# Patient Record
Sex: Female | Born: 1950
Health system: Southern US, Community
[De-identification: ages and names within clinical notes are randomized; demographics above are authoritative.]

## PROBLEM LIST (undated history)

## (undated) DIAGNOSIS — M1990 Unspecified osteoarthritis, unspecified site: Secondary | ICD-10-CM

## (undated) DIAGNOSIS — Z8719 Personal history of other diseases of the digestive system: Secondary | ICD-10-CM

## (undated) DIAGNOSIS — F32A Depression, unspecified: Secondary | ICD-10-CM

## (undated) DIAGNOSIS — E079 Disorder of thyroid, unspecified: Secondary | ICD-10-CM

## (undated) DIAGNOSIS — F419 Anxiety disorder, unspecified: Secondary | ICD-10-CM

## (undated) DIAGNOSIS — F988 Other specified behavioral and emotional disorders with onset usually occurring in childhood and adolescence: Secondary | ICD-10-CM

## (undated) DIAGNOSIS — K8020 Calculus of gallbladder without cholecystitis without obstruction: Secondary | ICD-10-CM

## (undated) DIAGNOSIS — K219 Gastro-esophageal reflux disease without esophagitis: Secondary | ICD-10-CM

## (undated) DIAGNOSIS — G5600 Carpal tunnel syndrome, unspecified upper limb: Secondary | ICD-10-CM

## (undated) DIAGNOSIS — K635 Polyp of colon: Secondary | ICD-10-CM

## (undated) DIAGNOSIS — Z9889 Other specified postprocedural states: Secondary | ICD-10-CM

## (undated) DIAGNOSIS — F329 Major depressive disorder, single episode, unspecified: Secondary | ICD-10-CM

## (undated) DIAGNOSIS — I10 Essential (primary) hypertension: Secondary | ICD-10-CM

## (undated) DIAGNOSIS — E785 Hyperlipidemia, unspecified: Secondary | ICD-10-CM

## (undated) DIAGNOSIS — K5900 Constipation, unspecified: Secondary | ICD-10-CM

## (undated) HISTORY — DX: Hyperlipidemia, unspecified: E78.5

## (undated) HISTORY — PX: CHOLECYSTECTOMY: SHX55

## (undated) HISTORY — PX: NASAL SINUS SURGERY: SHX719

## (undated) HISTORY — PX: SHOULDER SURGERY: SHX246

## (undated) HISTORY — DX: Gastro-esophageal reflux disease without esophagitis: K21.9

## (undated) HISTORY — DX: Calculus of gallbladder without cholecystitis without obstruction: K80.20

## (undated) HISTORY — DX: Depression, unspecified: F32.A

## (undated) HISTORY — DX: Major depressive disorder, single episode, unspecified: F32.9

## (undated) HISTORY — DX: Unspecified osteoarthritis, unspecified site: M19.90

## (undated) HISTORY — DX: Anxiety disorder, unspecified: F41.9

## (undated) HISTORY — DX: Constipation, unspecified: K59.00

## (undated) HISTORY — PX: TUBAL LIGATION: SHX77

## (undated) HISTORY — PX: TIBIA FRACTURE SURGERY: SHX806

## (undated) HISTORY — DX: Other specified behavioral and emotional disorders with onset usually occurring in childhood and adolescence: F98.8

## (undated) HISTORY — DX: Carpal tunnel syndrome, unspecified upper limb: G56.00

## (undated) HISTORY — DX: Essential (primary) hypertension: I10

## (undated) HISTORY — DX: Disorder of thyroid, unspecified: E07.9

## (undated) HISTORY — DX: Personal history of other diseases of the digestive system: Z87.19

## (undated) HISTORY — DX: Polyp of colon: K63.5

## (undated) HISTORY — DX: Other specified postprocedural states: Z98.890

## (undated) HISTORY — PX: CARPAL TUNNEL RELEASE: SHX101

## (undated) HISTORY — PX: CLOSED REDUCTION HAND FRACTURE: SHX973

## (undated) HISTORY — PX: ABDOMINAL HYSTERECTOMY: SHX81

## (undated) HISTORY — PX: OTHER SURGICAL HISTORY: SHX169

---

## 1999-06-04 ENCOUNTER — Other Ambulatory Visit: Admission: RE | Admit: 1999-06-04 | Discharge: 1999-06-04 | Payer: Self-pay | Admitting: Obstetrics and Gynecology

## 1999-06-04 ENCOUNTER — Encounter (INDEPENDENT_AMBULATORY_CARE_PROVIDER_SITE_OTHER): Payer: Self-pay

## 2000-08-15 ENCOUNTER — Emergency Department (HOSPITAL_COMMUNITY): Admission: EM | Admit: 2000-08-15 | Discharge: 2000-08-15 | Payer: Self-pay | Admitting: Emergency Medicine

## 2000-08-15 ENCOUNTER — Encounter: Payer: Self-pay | Admitting: Emergency Medicine

## 2001-09-11 ENCOUNTER — Encounter: Payer: Self-pay | Admitting: Orthopedic Surgery

## 2001-09-11 ENCOUNTER — Inpatient Hospital Stay (HOSPITAL_COMMUNITY): Admission: EM | Admit: 2001-09-11 | Discharge: 2001-09-15 | Payer: Self-pay | Admitting: *Deleted

## 2002-07-29 ENCOUNTER — Other Ambulatory Visit: Admission: RE | Admit: 2002-07-29 | Discharge: 2002-07-29 | Payer: Self-pay | Admitting: Obstetrics and Gynecology

## 2002-12-02 ENCOUNTER — Ambulatory Visit (HOSPITAL_COMMUNITY): Admission: RE | Admit: 2002-12-02 | Discharge: 2002-12-02 | Payer: Self-pay | Admitting: Orthopedic Surgery

## 2004-07-02 ENCOUNTER — Ambulatory Visit: Payer: Self-pay | Admitting: Internal Medicine

## 2004-10-04 ENCOUNTER — Ambulatory Visit: Payer: Self-pay | Admitting: Internal Medicine

## 2004-12-06 ENCOUNTER — Ambulatory Visit: Payer: Self-pay | Admitting: Internal Medicine

## 2005-03-11 ENCOUNTER — Ambulatory Visit: Payer: Self-pay | Admitting: Internal Medicine

## 2005-06-05 ENCOUNTER — Ambulatory Visit: Payer: Self-pay | Admitting: Internal Medicine

## 2005-06-24 ENCOUNTER — Other Ambulatory Visit: Admission: RE | Admit: 2005-06-24 | Discharge: 2005-06-24 | Payer: Self-pay | Admitting: Obstetrics and Gynecology

## 2005-08-05 ENCOUNTER — Ambulatory Visit: Payer: Self-pay | Admitting: Internal Medicine

## 2005-11-10 ENCOUNTER — Ambulatory Visit: Payer: Self-pay | Admitting: Internal Medicine

## 2006-02-10 ENCOUNTER — Ambulatory Visit: Payer: Self-pay | Admitting: Internal Medicine

## 2006-05-14 ENCOUNTER — Ambulatory Visit: Payer: Self-pay | Admitting: Internal Medicine

## 2006-07-30 ENCOUNTER — Ambulatory Visit: Payer: Self-pay | Admitting: Internal Medicine

## 2006-10-23 ENCOUNTER — Ambulatory Visit: Payer: Self-pay | Admitting: Internal Medicine

## 2006-12-07 ENCOUNTER — Ambulatory Visit: Payer: Self-pay | Admitting: Internal Medicine

## 2007-02-16 ENCOUNTER — Ambulatory Visit: Payer: Self-pay | Admitting: Internal Medicine

## 2007-02-16 LAB — CONVERTED CEMR LAB: TSH: 0.38 microintl units/mL (ref 0.35–5.50)

## 2007-06-08 ENCOUNTER — Ambulatory Visit: Payer: Self-pay | Admitting: Internal Medicine

## 2007-06-08 DIAGNOSIS — F909 Attention-deficit hyperactivity disorder, unspecified type: Secondary | ICD-10-CM

## 2007-06-08 DIAGNOSIS — K219 Gastro-esophageal reflux disease without esophagitis: Secondary | ICD-10-CM | POA: Insufficient documentation

## 2007-06-08 DIAGNOSIS — G5600 Carpal tunnel syndrome, unspecified upper limb: Secondary | ICD-10-CM | POA: Insufficient documentation

## 2007-06-08 DIAGNOSIS — I10 Essential (primary) hypertension: Secondary | ICD-10-CM

## 2007-06-08 DIAGNOSIS — E039 Hypothyroidism, unspecified: Secondary | ICD-10-CM

## 2007-06-08 HISTORY — DX: Attention-deficit hyperactivity disorder, unspecified type: F90.9

## 2007-06-08 HISTORY — DX: Hypothyroidism, unspecified: E03.9

## 2007-06-08 HISTORY — DX: Essential (primary) hypertension: I10

## 2007-12-20 ENCOUNTER — Ambulatory Visit: Payer: Self-pay | Admitting: Internal Medicine

## 2007-12-20 DIAGNOSIS — M1990 Unspecified osteoarthritis, unspecified site: Secondary | ICD-10-CM | POA: Insufficient documentation

## 2007-12-20 DIAGNOSIS — K5900 Constipation, unspecified: Secondary | ICD-10-CM | POA: Insufficient documentation

## 2007-12-20 HISTORY — DX: Unspecified osteoarthritis, unspecified site: M19.90

## 2008-02-16 ENCOUNTER — Ambulatory Visit: Payer: Self-pay | Admitting: Internal Medicine

## 2008-03-20 ENCOUNTER — Ambulatory Visit: Payer: Self-pay | Admitting: Internal Medicine

## 2008-03-20 LAB — CONVERTED CEMR LAB: TSH: 0.38 microintl units/mL (ref 0.35–5.50)

## 2008-05-23 ENCOUNTER — Telehealth: Payer: Self-pay | Admitting: Internal Medicine

## 2008-05-26 ENCOUNTER — Ambulatory Visit: Payer: Self-pay | Admitting: Internal Medicine

## 2008-05-26 DIAGNOSIS — R10817 Generalized abdominal tenderness: Secondary | ICD-10-CM

## 2008-05-27 ENCOUNTER — Encounter: Payer: Self-pay | Admitting: Internal Medicine

## 2008-05-29 LAB — CONVERTED CEMR LAB
ALT: 13 units/L (ref 0–35)
AST: 20 units/L (ref 0–37)
Bilirubin, Direct: 0.1 mg/dL (ref 0.0–0.3)
Indirect Bilirubin: 0.3 mg/dL (ref 0.0–0.9)
Lymphocytes Relative: 32 % (ref 12–46)
Lymphs Abs: 2.5 10*3/uL (ref 0.7–4.0)
Monocytes Relative: 7 % (ref 3–12)
Neutro Abs: 4.6 10*3/uL (ref 1.7–7.7)
Neutrophils Relative %: 58 % (ref 43–77)
RBC: 4.35 M/uL (ref 3.87–5.11)
Total Bilirubin: 0.4 mg/dL (ref 0.3–1.2)
WBC: 8 10*3/uL (ref 4.0–10.5)

## 2008-06-20 ENCOUNTER — Ambulatory Visit: Payer: Self-pay | Admitting: Internal Medicine

## 2008-08-22 ENCOUNTER — Ambulatory Visit: Payer: Self-pay | Admitting: Internal Medicine

## 2008-10-17 ENCOUNTER — Ambulatory Visit: Payer: Self-pay | Admitting: Internal Medicine

## 2008-10-17 DIAGNOSIS — K645 Perianal venous thrombosis: Secondary | ICD-10-CM

## 2008-10-17 DIAGNOSIS — B070 Plantar wart: Secondary | ICD-10-CM

## 2008-10-17 HISTORY — DX: Perianal venous thrombosis: K64.5

## 2008-12-19 ENCOUNTER — Ambulatory Visit: Payer: Self-pay | Admitting: Internal Medicine

## 2009-01-29 ENCOUNTER — Encounter: Payer: Self-pay | Admitting: Internal Medicine

## 2009-03-27 ENCOUNTER — Ambulatory Visit: Payer: Self-pay | Admitting: Internal Medicine

## 2009-03-27 DIAGNOSIS — M8580 Other specified disorders of bone density and structure, unspecified site: Secondary | ICD-10-CM

## 2009-03-27 LAB — CONVERTED CEMR LAB
Basophils Absolute: 0 10*3/uL (ref 0.0–0.1)
Basophils Relative: 0.4 % (ref 0.0–3.0)
CO2: 32 meq/L (ref 19–32)
Calcium: 9.4 mg/dL (ref 8.4–10.5)
Chloride: 102 meq/L (ref 96–112)
Creatinine, Ser: 0.4 mg/dL (ref 0.4–1.2)
Glucose, Bld: 147 mg/dL — ABNORMAL HIGH (ref 70–99)
HCT: 35.8 % — ABNORMAL LOW (ref 36.0–46.0)
Hemoglobin: 12.4 g/dL (ref 12.0–15.0)
Lymphocytes Relative: 28.6 % (ref 12.0–46.0)
Lymphs Abs: 2.1 10*3/uL (ref 0.7–4.0)
MCHC: 34.6 g/dL (ref 30.0–36.0)
Monocytes Relative: 5.4 % (ref 3.0–12.0)
Neutro Abs: 4.8 10*3/uL (ref 1.4–7.7)
RBC: 4.36 M/uL (ref 3.87–5.11)
RDW: 13.7 % (ref 11.5–14.6)
T3, Free: 2.3 pg/mL (ref 2.3–4.2)

## 2009-04-03 ENCOUNTER — Telehealth: Payer: Self-pay | Admitting: *Deleted

## 2009-05-10 ENCOUNTER — Telehealth: Payer: Self-pay | Admitting: Internal Medicine

## 2009-05-28 ENCOUNTER — Ambulatory Visit: Payer: Self-pay | Admitting: Internal Medicine

## 2009-05-28 DIAGNOSIS — M5410 Radiculopathy, site unspecified: Secondary | ICD-10-CM | POA: Insufficient documentation

## 2009-05-28 DIAGNOSIS — IMO0002 Reserved for concepts with insufficient information to code with codable children: Secondary | ICD-10-CM

## 2009-05-28 DIAGNOSIS — G4733 Obstructive sleep apnea (adult) (pediatric): Secondary | ICD-10-CM | POA: Insufficient documentation

## 2009-05-28 HISTORY — DX: Radiculopathy, site unspecified: M54.10

## 2009-06-01 ENCOUNTER — Encounter: Payer: Self-pay | Admitting: Internal Medicine

## 2009-06-01 ENCOUNTER — Ambulatory Visit (HOSPITAL_BASED_OUTPATIENT_CLINIC_OR_DEPARTMENT_OTHER): Admission: RE | Admit: 2009-06-01 | Discharge: 2009-06-01 | Payer: Self-pay | Admitting: Internal Medicine

## 2009-06-12 ENCOUNTER — Ambulatory Visit: Payer: Self-pay | Admitting: Pulmonary Disease

## 2009-07-30 ENCOUNTER — Ambulatory Visit: Payer: Self-pay | Admitting: Internal Medicine

## 2009-07-30 DIAGNOSIS — F411 Generalized anxiety disorder: Secondary | ICD-10-CM

## 2009-07-30 HISTORY — DX: Generalized anxiety disorder: F41.1

## 2009-10-01 ENCOUNTER — Ambulatory Visit: Payer: Self-pay | Admitting: Internal Medicine

## 2009-10-01 LAB — CONVERTED CEMR LAB: T3, Free: 2.3 pg/mL (ref 2.3–4.2)

## 2009-11-16 ENCOUNTER — Ambulatory Visit: Payer: Self-pay | Admitting: Internal Medicine

## 2009-11-16 DIAGNOSIS — C44519 Basal cell carcinoma of skin of other part of trunk: Secondary | ICD-10-CM | POA: Insufficient documentation

## 2009-11-16 DIAGNOSIS — C44599 Other specified malignant neoplasm of skin of other part of trunk: Secondary | ICD-10-CM

## 2009-11-16 HISTORY — DX: Basal cell carcinoma of skin of other part of trunk: C44.519

## 2009-12-18 ENCOUNTER — Ambulatory Visit: Payer: Self-pay | Admitting: Internal Medicine

## 2010-01-22 ENCOUNTER — Ambulatory Visit: Payer: Self-pay | Admitting: Internal Medicine

## 2010-05-01 ENCOUNTER — Ambulatory Visit: Payer: Self-pay | Admitting: Internal Medicine

## 2010-05-01 LAB — CONVERTED CEMR LAB
CO2: 30 meq/L (ref 19–32)
Chloride: 104 meq/L (ref 96–112)
Creatinine, Ser: 0.6 mg/dL (ref 0.4–1.2)
Potassium: 3.5 meq/L (ref 3.5–5.1)
Sodium: 142 meq/L (ref 135–145)
TSH: 0.37 microintl units/mL (ref 0.35–5.50)

## 2010-07-31 ENCOUNTER — Ambulatory Visit: Payer: Self-pay | Admitting: Internal Medicine

## 2010-09-04 ENCOUNTER — Ambulatory Visit
Admission: RE | Admit: 2010-09-04 | Discharge: 2010-09-04 | Payer: Self-pay | Source: Home / Self Care | Attending: Internal Medicine | Admitting: Internal Medicine

## 2010-09-19 NOTE — Assessment & Plan Note (Signed)
Summary: fup//ccm   Vital Signs:  Patient profile:   60 year old female Height:      65 inches Weight:      222 pounds BMI:     37.08 Temp:     98.8 degrees F oral Pulse rate:   72 / minute Resp:     14 per minute BP sitting:   130 / 80  (left arm)  Vitals Entered By: Willy Eddy, LPN (September 04, 2010 1:33 PM) CC: roa, Hypertension Management Is Patient Diabetic? No   Primary Care Provider:  Stacie Glaze MD  CC:  roa and Hypertension Management.  History of Present Illness: has lost weight has been limiting calories this has held the GERD and the HTN stable back pain is stable  Hypertension History:      She denies headache, chest pain, palpitations, dyspnea with exertion, orthopnea, PND, peripheral edema, visual symptoms, neurologic problems, syncope, and side effects from treatment.        Positive major cardiovascular risk factors include female age 59 years old or older and hypertension.  Negative major cardiovascular risk factors include non-tobacco-user status.     Preventive Screening-Counseling & Management  Alcohol-Tobacco     Smoking Status: never     Passive Smoke Exposure: no     Tobacco Counseling: not indicated; no tobacco use  Current Problems (verified): 1)  Carcinoma, Basal Cell, Back  (ICD-173.5) 2)  Obstructive Sleep Apnea  (ICD-327.23) 3)  Back Pain With Radiculopathy  (ICD-729.2) 4)  Osteopenia  (ICD-733.90) 5)  Plantar Wart  (ICD-078.12) 6)  Unspecified Thrombosed Hemorrhoids  (ICD-455.7) 7)  Abdominal Tenderness, Generalized  (ICD-789.67) 8)  Bursitis, Right Shoulder  (ICD-726.10) 9)  Anxiety Disorder, Generalized  (ICD-300.02) 10)  Osteoarthritis  (ICD-715.90) 11)  Constipation, Intermittent  (ICD-564.00) 12)  Carpal Tunnel Syndrome  (ICD-354.0) 13)  Adhd  (ICD-314.01) 14)  Hypothyroidism  (ICD-244.9) 15)  Hypertension  (ICD-401.9) 16)  Gerd  (ICD-530.81)  Current Medications (verified): 1)  Synthroid 137 Mcg  Tabs  (Levothyroxine Sodium) .... Once Daily 2)  Prevacid 30 Mg  Cpdr (Lansoprazole) .... Once Daily 3)  Ziac 5-6.25 Mg  Tabs (Bisoprolol-Hydrochlorothiazide) .... Once Daily 4)  Colace 100 Mg  Caps (Docusate Sodium) .... One By Mouth Daily 5)  Celebrex 200 Mg Caps (Celecoxib) .... Once Daily As Needed 6)  Vitamin D 1000 Unit Tabs (Cholecalciferol) .... One By Mouth Daily 7)  Diazepam 10 Mg Tabs (Diazepam) .... 1/2 By Mouth Two Times A Day  Allergies (verified): No Known Drug Allergies  Past History:  Family History: Last updated: 06/08/2007 father Family History Lung cancer mother Family History High cholesterol  Social History: Last updated: 06/08/2007 Married Never Smoked Alcohol use-no Drug use-no Regular exercise-no  Risk Factors: Exercise: no (06/08/2007)  Risk Factors: Smoking Status: never (09/04/2010) Passive Smoke Exposure: no (09/04/2010)  Past medical, surgical, family and social histories (including risk factors) reviewed, and no changes noted (except as noted below).  Past Medical History: Reviewed history from 12/20/2007 and no changes required. Hypertension Hypothyroidism GERD carple tunnel Osteoarthritis  Past Surgical History: Reviewed history from 06/08/2007 and no changes required. Cholecystectomy Hysterectomy Tubal ligation fx leg Sinus surgery  Family History: Reviewed history from 06/08/2007 and no changes required. father Family History Lung cancer mother Family History High cholesterol  Social History: Reviewed history from 06/08/2007 and no changes required. Married Never Smoked Alcohol use-no Drug use-no Regular exercise-no  Review of Systems  The patient denies anorexia, fever, weight loss, weight  gain, vision loss, decreased hearing, hoarseness, chest pain, syncope, dyspnea on exertion, peripheral edema, prolonged cough, headaches, hemoptysis, abdominal pain, melena, hematochezia, severe indigestion/heartburn, hematuria,  incontinence, genital sores, muscle weakness, suspicious skin lesions, transient blindness, difficulty walking, depression, unusual weight change, abnormal bleeding, enlarged lymph nodes, angioedema, and breast masses.    Physical Exam  General:  alert and overweight-appearing.   Head:  normocephalic and no abnormalities palpated.   Eyes:  vision grossly intact and pupils reactive to light.   Ears:  R ear normal and L ear normal.   Nose:  no external deformity and no nasal discharge.   Neck:  supple and full ROM.   Lungs:  normal respiratory effort and no wheezes.   Heart:  normal rate and regular rhythm.   Abdomen:  soft and non-tender.   Msk:  lumbar lordosis and SI joint tenderness.   Pulses:  R and L carotid,radial,femoral,dorsalis pedis and posterior tibial pulses are full and equal bilaterally Extremities:  No clubbing, cyanosis, edema, or deformity noted with normal full range of motion of all joints.   Neurologic:  No cranial nerve deficits noted. Station and gait are normal. Plantar reflexes are down-going bilaterally. DTRs are symmetrical throughout. Sensory, motor and coordinative functions appear intact. Skin:  turgor normal and color normal.   Psych:  flat affect, subdued, and withdrawn.     Impression & Recommendations:  Problem # 1:  HYPOTHYROIDISM (ICD-244.9)  Her updated medication list for this problem includes:    Synthroid 137 Mcg Tabs (Levothyroxine sodium) ..... Once daily  Labs Reviewed: TSH: 0.37 (05/01/2010)     Problem # 2:  HYPERTENSION (ICD-401.9) Assessment: Unchanged  Her updated medication list for this problem includes:    Ziac 5-6.25 Mg Tabs (Bisoprolol-hydrochlorothiazide) ..... Once daily  BP today: 130/80 Prior BP: 136/80 (05/01/2010)  Prior 10 Yr Risk Heart Disease: Not enough information (12/20/2007)  Labs Reviewed: K+: 3.5 (05/01/2010) Creat: : 0.6 (05/01/2010)     Problem # 3:  GERD (ICD-530.81) Assessment: Improved  Her updated  medication list for this problem includes:    Prevacid 30 Mg Cpdr (Lansoprazole) ..... Once daily  Labs Reviewed: Hgb: 12.4 (03/27/2009)   Hct: 35.8 (03/27/2009)  Problem # 4:  OSTEOARTHRITIS (ICD-715.90) Assessment: Deteriorated sligt worsening with less exercize Her updated medication list for this problem includes:    Celebrex 200 Mg Caps (Celecoxib) ..... Once daily as needed  Discussed use of medications, application of heat or cold, and exercises.   Complete Medication List: 1)  Synthroid 137 Mcg Tabs (Levothyroxine sodium) .... Once daily 2)  Prevacid 30 Mg Cpdr (Lansoprazole) .... Once daily 3)  Ziac 5-6.25 Mg Tabs (Bisoprolol-hydrochlorothiazide) .... Once daily 4)  Colace 100 Mg Caps (Docusate sodium) .... One by mouth daily 5)  Celebrex 200 Mg Caps (Celecoxib) .... Once daily as needed 6)  Vitamin D 1000 Unit Tabs (Cholecalciferol) .... One by mouth daily 7)  Diazepam 10 Mg Tabs (Diazepam) .... 1/2 by mouth two times a day  Hypertension Assessment/Plan:      The patient's hypertensive risk group is category B: At least one risk factor (excluding diabetes) with no target organ damage.  Today's blood pressure is 130/80.  Her blood pressure goal is < 140/90.  Patient Instructions: 1)  Please schedule a follow-up appointment in 3 months.   Orders Added: 1)  Est. Patient Level II [11914]

## 2010-09-19 NOTE — Assessment & Plan Note (Signed)
Summary: 3 month rov/njr rsc bmp/njr PT BUMPED//SLM   Vital Signs:  Patient profile:   60 year old female Height:      65 inches Weight:      238 pounds BMI:     39.75 Temp:     98.2 degrees F oral Pulse rate:   76 / minute Resp:     14 per minute BP sitting:   136 / 80  (left arm)  Vitals Entered By: Willy Eddy, LPN (May 01, 2010 8:01 AM) CC: roa, Hypertension Management Is Patient Diabetic? No   CC:  roa and Hypertension Management.  History of Present Illness: the pt had BCCA and he site was reexamined  Hypertension History:      She denies headache, chest pain, palpitations, dyspnea with exertion, orthopnea, PND, peripheral edema, visual symptoms, neurologic problems, syncope, and side effects from treatment.        Positive major cardiovascular risk factors include female age 15 years old or older and hypertension.  Negative major cardiovascular risk factors include non-tobacco-user status.     Preventive Screening-Counseling & Management  Alcohol-Tobacco     Smoking Status: never     Passive Smoke Exposure: no     Tobacco Counseling: not indicated; no tobacco use  Problems Prior to Update: 1)  Carcinoma, Basal Cell, Back  (ICD-173.5) 2)  Obstructive Sleep Apnea  (ICD-327.23) 3)  Back Pain With Radiculopathy  (ICD-729.2) 4)  Osteopenia  (ICD-733.90) 5)  Plantar Wart  (ICD-078.12) 6)  Unspecified Thrombosed Hemorrhoids  (ICD-455.7) 7)  Abdominal Tenderness, Generalized  (ICD-789.67) 8)  Bursitis, Right Shoulder  (ICD-726.10) 9)  Anxiety Disorder, Generalized  (ICD-300.02) 10)  Osteoarthritis  (ICD-715.90) 11)  Constipation, Intermittent  (ICD-564.00) 12)  Carpal Tunnel Syndrome  (ICD-354.0) 13)  Adhd  (ICD-314.01) 14)  Hypothyroidism  (ICD-244.9) 15)  Hypertension  (ICD-401.9) 16)  Gerd  (ICD-530.81)  Current Problems (verified): 1)  Carcinoma, Basal Cell, Back  (ICD-173.5) 2)  Obstructive Sleep Apnea  (ICD-327.23) 3)  Back Pain With  Radiculopathy  (ICD-729.2) 4)  Osteopenia  (ICD-733.90) 5)  Plantar Wart  (ICD-078.12) 6)  Unspecified Thrombosed Hemorrhoids  (ICD-455.7) 7)  Abdominal Tenderness, Generalized  (ICD-789.67) 8)  Bursitis, Right Shoulder  (ICD-726.10) 9)  Anxiety Disorder, Generalized  (ICD-300.02) 10)  Osteoarthritis  (ICD-715.90) 11)  Constipation, Intermittent  (ICD-564.00) 12)  Carpal Tunnel Syndrome  (ICD-354.0) 13)  Adhd  (ICD-314.01) 14)  Hypothyroidism  (ICD-244.9) 15)  Hypertension  (ICD-401.9) 16)  Gerd  (ICD-530.81)  Medications Prior to Update: 1)  Synthroid 137 Mcg  Tabs (Levothyroxine Sodium) .... Once Daily 2)  Prevacid 30 Mg  Cpdr (Lansoprazole) .... Once Daily 3)  Ziac 5-6.25 Mg  Tabs (Bisoprolol-Hydrochlorothiazide) .... Once Daily 4)  Colace 100 Mg  Caps (Docusate Sodium) .... One By Mouth Daily 5)  Celebrex 200 Mg Caps (Celecoxib) .... Once Daily As Needed 6)  Vitamin D 1000 Unit Tabs (Cholecalciferol) .... One By Mouth Daily 7)  Clorazepate Dipotassium 3.75 Mg Tabs (Clorazepate Dipotassium) .... One By Mouth Two Times A Day  Current Medications (verified): 1)  Synthroid 137 Mcg  Tabs (Levothyroxine Sodium) .... Once Daily 2)  Prevacid 30 Mg  Cpdr (Lansoprazole) .... Once Daily 3)  Ziac 5-6.25 Mg  Tabs (Bisoprolol-Hydrochlorothiazide) .... Once Daily 4)  Colace 100 Mg  Caps (Docusate Sodium) .... One By Mouth Daily 5)  Celebrex 200 Mg Caps (Celecoxib) .... Once Daily As Needed 6)  Vitamin D 1000 Unit Tabs (Cholecalciferol) .... One By Mouth Daily  7)  Clorazepate Dipotassium 3.75 Mg Tabs (Clorazepate Dipotassium) .... One By Mouth Two Times A Day  Allergies (verified): No Known Drug Allergies  Contraindications/Deferment of Procedures/Staging:    Test/Procedure: FLU VAX    Reason for deferment: patient declined   Past History:  Family History: Last updated: 06/08/2007 father Family History Lung cancer mother Family History High cholesterol  Social History: Last  updated: 06/08/2007 Married Never Smoked Alcohol use-no Drug use-no Regular exercise-no  Risk Factors: Exercise: no (06/08/2007)  Risk Factors: Smoking Status: never (05/01/2010) Passive Smoke Exposure: no (05/01/2010)  Past medical, surgical, family and social histories (including risk factors) reviewed, and no changes noted (except as noted below).  Past Medical History: Reviewed history from 12/20/2007 and no changes required. Hypertension Hypothyroidism GERD carple tunnel Osteoarthritis  Past Surgical History: Reviewed history from 06/08/2007 and no changes required. Cholecystectomy Hysterectomy Tubal ligation fx leg Sinus surgery  Family History: Reviewed history from 06/08/2007 and no changes required. father Family History Lung cancer mother Family History High cholesterol  Social History: Reviewed history from 06/08/2007 and no changes required. Married Never Smoked Alcohol use-no Drug use-no Regular exercise-no Passive Smoke Exposure:  no  Review of Systems  The patient denies anorexia, fever, weight loss, weight gain, vision loss, decreased hearing, hoarseness, chest pain, syncope, dyspnea on exertion, peripheral edema, prolonged cough, headaches, hemoptysis, abdominal pain, melena, hematochezia, severe indigestion/heartburn, hematuria, incontinence, genital sores, muscle weakness, suspicious skin lesions, transient blindness, difficulty walking, depression, unusual weight change, abnormal bleeding, enlarged lymph nodes, angioedema, and breast masses.    Physical Exam  General:  alert and overweight-appearing.   Head:  normocephalic and no abnormalities palpated.   Eyes:  vision grossly intact and pupils reactive to light.   Ears:  R ear normal and L ear normal.   Nose:  no external deformity and no nasal discharge.   Mouth:  pharynx pink and moist and no erythema.   Neck:  supple and full ROM.   Lungs:  normal respiratory effort and no wheezes.    Heart:  normal rate and regular rhythm.   Abdomen:  soft and non-tender.   Msk:  lumbar lordosis and SI joint tenderness.   Pulses:  R and L carotid,radial,femoral,dorsalis pedis and posterior tibial pulses are full and equal bilaterally Extremities:  No clubbing, cyanosis, edema, or deformity noted with normal full range of motion of all joints.   Neurologic:  No cranial nerve deficits noted. Station and gait are normal. Plantar reflexes are down-going bilaterally. DTRs are symmetrical throughout. Sensory, motor and coordinative functions appear intact.   Impression & Recommendations:  Problem # 1:  CARCINOMA, BASAL CELL, BACK (ICD-173.5) site inspected fro recurrance  Problem # 2:  HYPOTHYROIDISM (ICD-244.9)  Her updated medication list for this problem includes:    Synthroid 137 Mcg Tabs (Levothyroxine sodium) ..... Once daily  Labs Reviewed: TSH: 0.30 (10/01/2009)     Orders: Venipuncture (81191) TLB-TSH (Thyroid Stimulating Hormone) (84443-TSH)  Problem # 3:  GERD (ICD-530.81)  Her updated medication list for this problem includes:    Prevacid 30 Mg Cpdr (Lansoprazole) ..... Once daily  Labs Reviewed: Hgb: 12.4 (03/27/2009)   Hct: 35.8 (03/27/2009)  Problem # 4:  ANXIETY DISORDER, GENERALIZED (ICD-300.02) discuss referral to job counsilling Her updated medication list for this problem includes:    Diazepam 10 Mg Tabs (Diazepam) .Marland Kitchen... 1/2 by mouth two times a day  Discussed medication use and relaxation techniques.   Complete Medication List: 1)  Synthroid 137 Mcg Tabs (Levothyroxine sodium) .Marland KitchenMarland KitchenMarland Kitchen  Once daily 2)  Prevacid 30 Mg Cpdr (Lansoprazole) .... Once daily 3)  Ziac 5-6.25 Mg Tabs (Bisoprolol-hydrochlorothiazide) .... Once daily 4)  Colace 100 Mg Caps (Docusate sodium) .... One by mouth daily 5)  Celebrex 200 Mg Caps (Celecoxib) .... Once daily as needed 6)  Vitamin D 1000 Unit Tabs (Cholecalciferol) .... One by mouth daily 7)  Diazepam 10 Mg Tabs (Diazepam)  .... 1/2 by mouth two times a day  Other Orders: TLB-BMP (Basic Metabolic Panel-BMET) (80048-METABOL)  Hypertension Assessment/Plan:      The patient's hypertensive risk group is category B: At least one risk factor (excluding diabetes) with no target organ damage.  Today's blood pressure is 136/80.  Her blood pressure goal is < 140/90.  Patient Instructions: 1)  Please schedule a follow-up appointment in 3 months. Prescriptions: DIAZEPAM 10 MG TABS (DIAZEPAM) 1/2 by mouth two times a day  #30 x 3   Entered and Authorized by:   Stacie Glaze MD   Signed by:   Stacie Glaze MD on 05/01/2010   Method used:   Print then Give to Patient   RxID:   [id number]   Appended Document: Orders Update    Clinical Lists Changes  Orders: Added new Service order of Specimen Handling ([id number]) - Signed

## 2010-09-19 NOTE — Assessment & Plan Note (Signed)
Summary: 2 month rov/njr/pt rescd//ccm   Vital Signs:  Patient profile:   60 year old female Height:      65 inches Weight:      233 pounds BMI:     38.91 Temp:     98.2 degrees F oral Pulse rate:   76 / minute Resp:     14 per minute BP sitting:   130 / 80  (left arm) Cuff size:   large  Vitals Entered By: Willy Eddy, LPN (November 16, 1608 10:48 AM) CC: roa-   CC:  roa-.  History of Present Illness: weight is down mood is no subjectively improved some head conjestion  Preventive Screening-Counseling & Management  Alcohol-Tobacco     Smoking Status: never  Problems Prior to Update: 1)  Obstructive Sleep Apnea  (ICD-327.23) 2)  Back Pain With Radiculopathy  (ICD-729.2) 3)  Osteopenia  (ICD-733.90) 4)  Plantar Wart  (ICD-078.12) 5)  Unspecified Thrombosed Hemorrhoids  (ICD-455.7) 6)  Abdominal Tenderness, Generalized  (ICD-789.67) 7)  Bursitis, Right Shoulder  (ICD-726.10) 8)  Anxiety Disorder, Generalized  (ICD-300.02) 9)  Osteoarthritis  (ICD-715.90) 10)  Constipation, Intermittent  (ICD-564.00) 11)  Carpal Tunnel Syndrome  (ICD-354.0) 12)  Adhd  (ICD-314.01) 13)  Hypothyroidism  (ICD-244.9) 14)  Hypertension  (ICD-401.9) 15)  Gerd  (ICD-530.81)  Current Problems (verified): 1)  Obstructive Sleep Apnea  (ICD-327.23) 2)  Back Pain With Radiculopathy  (ICD-729.2) 3)  Osteopenia  (ICD-733.90) 4)  Plantar Wart  (ICD-078.12) 5)  Unspecified Thrombosed Hemorrhoids  (ICD-455.7) 6)  Abdominal Tenderness, Generalized  (ICD-789.67) 7)  Bursitis, Right Shoulder  (ICD-726.10) 8)  Anxiety Disorder, Generalized  (ICD-300.02) 9)  Osteoarthritis  (ICD-715.90) 10)  Constipation, Intermittent  (ICD-564.00) 11)  Carpal Tunnel Syndrome  (ICD-354.0) 12)  Adhd  (ICD-314.01) 13)  Hypothyroidism  (ICD-244.9) 14)  Hypertension  (ICD-401.9) 15)  Gerd  (ICD-530.81)  Medications Prior to Update: 1)  Synthroid 137 Mcg  Tabs (Levothyroxine Sodium) .... Once Daily 2)   Prevacid 30 Mg  Cpdr (Lansoprazole) .... Once Daily 3)  Ziac 5-6.25 Mg  Tabs (Bisoprolol-Hydrochlorothiazide) .... Once Daily 4)  Colace 100 Mg  Caps (Docusate Sodium) .... One By Mouth Daily 5)  Fluoxetine Hcl 20 Mg Caps (Fluoxetine Hcl) .... One By Mouth Daily 6)  Celebrex 200 Mg Caps (Celecoxib) .... Once Daily As Needed 7)  Vitamin D 1000 Unit Tabs (Cholecalciferol) .... One By Mouth Daily  Current Medications (verified): 1)  Synthroid 137 Mcg  Tabs (Levothyroxine Sodium) .... Once Daily 2)  Prevacid 30 Mg  Cpdr (Lansoprazole) .... Once Daily 3)  Ziac 5-6.25 Mg  Tabs (Bisoprolol-Hydrochlorothiazide) .... Once Daily 4)  Colace 100 Mg  Caps (Docusate Sodium) .... One By Mouth Daily 5)  Fluoxetine Hcl 20 Mg Caps (Fluoxetine Hcl) .... One By Mouth Daily 6)  Celebrex 200 Mg Caps (Celecoxib) .... Once Daily As Needed 7)  Vitamin D 1000 Unit Tabs (Cholecalciferol) .... One By Mouth Daily  Allergies (verified): No Known Drug Allergies  Past History:  Family History: Last updated: 06/08/2007 father Family History Lung cancer mother Family History High cholesterol  Social History: Last updated: 06/08/2007 Married Never Smoked Alcohol use-no Drug use-no Regular exercise-no  Risk Factors: Exercise: no (06/08/2007)  Risk Factors: Smoking Status: never (11/16/2009)  Past medical, surgical, family and social histories (including risk factors) reviewed, and no changes noted (except as noted below).  Past Medical History: Reviewed history from 12/20/2007 and no changes required. Hypertension Hypothyroidism GERD carple tunnel Osteoarthritis  Past Surgical History: Reviewed history from 06/08/2007 and no changes required. Cholecystectomy Hysterectomy Tubal ligation fx leg Sinus surgery  Family History: Reviewed history from 06/08/2007 and no changes required. father Family History Lung cancer mother Family History High cholesterol  Social History: Reviewed history  from 06/08/2007 and no changes required. Married Never Smoked Alcohol use-no Drug use-no Regular exercise-no  Physical Exam  General:  alert and overweight-appearing.   Head:  normocephalic and no abnormalities palpated.   Eyes:  vision grossly intact and pupils reactive to light.   Ears:  R ear normal and L ear normal.   Nose:  no external deformity and no nasal discharge.   Mouth:  pharynx pink and moist and no erythema.     Impression & Recommendations:  Problem # 1:  ANXIETY DISORDER, GENERALIZED (ICD-300.02)  Her updated medication list for this problem includes:    Pristiq 50 Mg Xr24h-tab (Desvenlafaxine succinate) ..... One by mouth daily  Discussed medication use and relaxation techniques.   Problem # 2:  BACK PAIN WITH RADICULOPATHY (ICD-729.2)  Problem # 3:  CARCINOMA, BASAL CELL, BACK (ICD-173.5) schedule surgical removal on lesion on back with characteristic of BSCS  Complete Medication List: 1)  Synthroid 137 Mcg Tabs (Levothyroxine sodium) .... Once daily 2)  Prevacid 30 Mg Cpdr (Lansoprazole) .... Once daily 3)  Ziac 5-6.25 Mg Tabs (Bisoprolol-hydrochlorothiazide) .... Once daily 4)  Colace 100 Mg Caps (Docusate sodium) .... One by mouth daily 5)  Pristiq 50 Mg Xr24h-tab (Desvenlafaxine succinate) .... One by mouth daily 6)  Celebrex 200 Mg Caps (Celecoxib) .... Once daily as needed 7)  Vitamin D 1000 Unit Tabs (Cholecalciferol) .... One by mouth daily  Patient Instructions: 1)  2-3 week for skin surgery

## 2010-09-19 NOTE — Assessment & Plan Note (Signed)
Summary: bcc removal on back /bmw   Vital Signs:  Patient profile:   60 year old female Height:      65 inches Weight:      233 pounds BMI:     38.91 Temp:     98.3 degrees F oral Pulse rate:   76 / minute Resp:     14 per minute BP sitting:   134 / 80  (left arm)  Vitals Entered By: Willy Eddy, LPN (Dec 18, 1608 4:51 PM) CC: bx on back   CC:  bx on back.  History of Present Illness: presnet for removal of two lesions on back that are cosistnat with BCCA presentations  Preventive Screening-Counseling & Management  Alcohol-Tobacco     Smoking Status: never  Problems Prior to Update: 1)  Carcinoma, Basal Cell, Back  (ICD-173.5) 2)  Obstructive Sleep Apnea  (ICD-327.23) 3)  Back Pain With Radiculopathy  (ICD-729.2) 4)  Osteopenia  (ICD-733.90) 5)  Plantar Wart  (ICD-078.12) 6)  Unspecified Thrombosed Hemorrhoids  (ICD-455.7) 7)  Abdominal Tenderness, Generalized  (ICD-789.67) 8)  Bursitis, Right Shoulder  (ICD-726.10) 9)  Anxiety Disorder, Generalized  (ICD-300.02) 10)  Osteoarthritis  (ICD-715.90) 11)  Constipation, Intermittent  (ICD-564.00) 12)  Carpal Tunnel Syndrome  (ICD-354.0) 13)  Adhd  (ICD-314.01) 14)  Hypothyroidism  (ICD-244.9) 15)  Hypertension  (ICD-401.9) 16)  Gerd  (ICD-530.81)  Current Problems (verified): 1)  Carcinoma, Basal Cell, Back  (ICD-173.5) 2)  Obstructive Sleep Apnea  (ICD-327.23) 3)  Back Pain With Radiculopathy  (ICD-729.2) 4)  Osteopenia  (ICD-733.90) 5)  Plantar Wart  (ICD-078.12) 6)  Unspecified Thrombosed Hemorrhoids  (ICD-455.7) 7)  Abdominal Tenderness, Generalized  (ICD-789.67) 8)  Bursitis, Right Shoulder  (ICD-726.10) 9)  Anxiety Disorder, Generalized  (ICD-300.02) 10)  Osteoarthritis  (ICD-715.90) 11)  Constipation, Intermittent  (ICD-564.00) 12)  Carpal Tunnel Syndrome  (ICD-354.0) 13)  Adhd  (ICD-314.01) 14)  Hypothyroidism  (ICD-244.9) 15)  Hypertension  (ICD-401.9) 16)  Gerd  (ICD-530.81)  Medications  Prior to Update: 1)  Synthroid 137 Mcg  Tabs (Levothyroxine Sodium) .... Once Daily 2)  Prevacid 30 Mg  Cpdr (Lansoprazole) .... Once Daily 3)  Ziac 5-6.25 Mg  Tabs (Bisoprolol-Hydrochlorothiazide) .... Once Daily 4)  Colace 100 Mg  Caps (Docusate Sodium) .... One By Mouth Daily 5)  Pristiq 50 Mg Xr24h-Tab (Desvenlafaxine Succinate) .... One By Mouth Daily 6)  Celebrex 200 Mg Caps (Celecoxib) .... Once Daily As Needed 7)  Vitamin D 1000 Unit Tabs (Cholecalciferol) .... One By Mouth Daily  Current Medications (verified): 1)  Synthroid 137 Mcg  Tabs (Levothyroxine Sodium) .... Once Daily 2)  Prevacid 30 Mg  Cpdr (Lansoprazole) .... Once Daily 3)  Ziac 5-6.25 Mg  Tabs (Bisoprolol-Hydrochlorothiazide) .... Once Daily 4)  Colace 100 Mg  Caps (Docusate Sodium) .... One By Mouth Daily 5)  Celebrex 200 Mg Caps (Celecoxib) .... Once Daily As Needed 6)  Vitamin D 1000 Unit Tabs (Cholecalciferol) .... One By Mouth Daily 7)  Clorazepate Dipotassium 3.75 Mg Tabs (Clorazepate Dipotassium) .... One By Mouth Two Times A Day  Allergies (verified): No Known Drug Allergies  Past History:  Family History: Last updated: 06/08/2007 father Family History Lung cancer mother Family History High cholesterol  Social History: Last updated: 06/08/2007 Married Never Smoked Alcohol use-no Drug use-no Regular exercise-no  Risk Factors: Exercise: no (06/08/2007)  Risk Factors: Smoking Status: never (12/18/2009)  Past medical, surgical, family and social histories (including risk factors) reviewed, and no changes noted (except as  noted below).  Past Medical History: Reviewed history from 12/20/2007 and no changes required. Hypertension Hypothyroidism GERD carple tunnel Osteoarthritis  Past Surgical History: Reviewed history from 06/08/2007 and no changes required. Cholecystectomy Hysterectomy Tubal ligation fx leg Sinus surgery  Family History: Reviewed history from 06/08/2007 and no  changes required. father Family History Lung cancer mother Family History High cholesterol  Social History: Reviewed history from 06/08/2007 and no changes required. Married Never Smoked Alcohol use-no Drug use-no Regular exercise-no  Review of Systems  The patient denies anorexia, fever, weight loss, weight gain, vision loss, decreased hearing, hoarseness, chest pain, syncope, dyspnea on exertion, peripheral edema, prolonged cough, headaches, hemoptysis, abdominal pain, melena, hematochezia, severe indigestion/heartburn, hematuria, incontinence, genital sores, muscle weakness, suspicious skin lesions, transient blindness, difficulty walking, depression, unusual weight change, abnormal bleeding, enlarged lymph nodes, angioedema, and breast masses.    Physical Exam  General:  alert and overweight-appearing.   Skin:  two lesions on  back   Impression & Recommendations:  Problem # 1:  CARCINOMA, BASAL CELL, BACK (ICD-173.5)  pt was preped in a sterile manor and informed consent obtained. Using a 15 blade two lesions was removed and sent for pathology. sterile dressings were applied  and wound care discussed with the pt. one lesion was mid upper back and one lesion was mid lower back  Orders: No Charge Patient Arrived (NCPA0) (NCPA0) Excise Malig lesion (FEENL) 0.6 - 1.0 cm (11641)  Complete Medication List: 1)  Synthroid 137 Mcg Tabs (Levothyroxine sodium) .... Once daily 2)  Prevacid 30 Mg Cpdr (Lansoprazole) .... Once daily 3)  Ziac 5-6.25 Mg Tabs (Bisoprolol-hydrochlorothiazide) .... Once daily 4)  Colace 100 Mg Caps (Docusate sodium) .... One by mouth daily 5)  Celebrex 200 Mg Caps (Celecoxib) .... Once daily as needed 6)  Vitamin D 1000 Unit Tabs (Cholecalciferol) .... One by mouth daily 7)  Clorazepate Dipotassium 3.75 Mg Tabs (Clorazepate dipotassium) .... One by mouth two times a day  Patient Instructions: 1)  Please schedule a follow-up appointment in 1  month. Prescriptions: CLORAZEPATE DIPOTASSIUM 3.75 MG TABS (CLORAZEPATE DIPOTASSIUM) one by mouth two times a day  #60 x 3   Entered and Authorized by:   Stacie Glaze MD   Signed by:   Stacie Glaze MD on 12/18/2009   Method used:   Print then Give to Patient   RxID:   [id number]

## 2010-09-19 NOTE — Assessment & Plan Note (Signed)
Summary: 2 month rov/njr   Vital Signs:  Patient profile:   60 year old female Height:      65 inches Weight:      238 pounds BMI:     39.75 Temp:     98.2 degrees F oral Pulse rate:   72 / minute Resp:     14 per minute BP sitting:   136 / 84  (left arm)  Vitals Entered By: Willy Eddy, LPN (October 01, 2009 1:33 PM) CC: roa, Hypertension Management   CC:  roa and Hypertension Management.  History of Present Illness: weight increased the lexapro may  contribute to the weight gain HTN complicated bu the weight gain  Hypertension History:      She denies headache, chest pain, palpitations, dyspnea with exertion, orthopnea, PND, peripheral edema, visual symptoms, neurologic problems, syncope, and side effects from treatment.        Positive major cardiovascular risk factors include female age 28 years old or older and hypertension.  Negative major cardiovascular risk factors include non-tobacco-user status.     Preventive Screening-Counseling & Management  Alcohol-Tobacco     Smoking Status: never  Problems Prior to Update: 1)  Obstructive Sleep Apnea  (ICD-327.23) 2)  Back Pain With Radiculopathy  (ICD-729.2) 3)  Osteopenia  (ICD-733.90) 4)  Plantar Wart  (ICD-078.12) 5)  Unspecified Thrombosed Hemorrhoids  (ICD-455.7) 6)  Abdominal Tenderness, Generalized  (ICD-789.67) 7)  Bursitis, Right Shoulder  (ICD-726.10) 8)  Anxiety Disorder, Generalized  (ICD-300.02) 9)  Osteoarthritis  (ICD-715.90) 10)  Constipation, Intermittent  (ICD-564.00) 11)  Carpal Tunnel Syndrome  (ICD-354.0) 12)  Adhd  (ICD-314.01) 13)  Hypothyroidism  (ICD-244.9) 14)  Hypertension  (ICD-401.9) 15)  Gerd  (ICD-530.81)  Current Problems (verified): 1)  Obstructive Sleep Apnea  (ICD-327.23) 2)  Back Pain With Radiculopathy  (ICD-729.2) 3)  Osteopenia  (ICD-733.90) 4)  Plantar Wart  (ICD-078.12) 5)  Unspecified Thrombosed Hemorrhoids  (ICD-455.7) 6)  Abdominal Tenderness, Generalized   (ICD-789.67) 7)  Bursitis, Right Shoulder  (ICD-726.10) 8)  Anxiety Disorder, Generalized  (ICD-300.02) 9)  Osteoarthritis  (ICD-715.90) 10)  Constipation, Intermittent  (ICD-564.00) 11)  Carpal Tunnel Syndrome  (ICD-354.0) 12)  Adhd  (ICD-314.01) 13)  Hypothyroidism  (ICD-244.9) 14)  Hypertension  (ICD-401.9) 15)  Gerd  (ICD-530.81)  Medications Prior to Update: 1)  Synthroid 137 Mcg  Tabs (Levothyroxine Sodium) .... Once Daily 2)  Prevacid 30 Mg  Cpdr (Lansoprazole) .... Once Daily 3)  Ziac 5-6.25 Mg  Tabs (Bisoprolol-Hydrochlorothiazide) .... Once Daily 4)  Colace 100 Mg  Caps (Docusate Sodium) .... One By Mouth Daily 5)  Citalopram Hydrobromide 40 Mg Tabs (Citalopram Hydrobromide) .... One By Mouth Daily 6)  Celebrex 200 Mg Caps (Celecoxib) .... Once Daily As Needed 7)  Vitamin D 1000 Unit Tabs (Cholecalciferol) .... One By Mouth Daily  Current Medications (verified): 1)  Synthroid 137 Mcg  Tabs (Levothyroxine Sodium) .... Once Daily 2)  Prevacid 30 Mg  Cpdr (Lansoprazole) .... Once Daily 3)  Ziac 5-6.25 Mg  Tabs (Bisoprolol-Hydrochlorothiazide) .... Once Daily 4)  Colace 100 Mg  Caps (Docusate Sodium) .... One By Mouth Daily 5)  Citalopram Hydrobromide 40 Mg Tabs (Citalopram Hydrobromide) .... One By Mouth Daily 6)  Celebrex 200 Mg Caps (Celecoxib) .... Once Daily As Needed 7)  Vitamin D 1000 Unit Tabs (Cholecalciferol) .... One By Mouth Daily  Allergies (verified): No Known Drug Allergies  Past History:  Family History: Last updated: 06/08/2007 father Family History Lung cancer mother Family History  High cholesterol  Social History: Last updated: 06/08/2007 Married Never Smoked Alcohol use-no Drug use-no Regular exercise-no  Risk Factors: Exercise: no (06/08/2007)  Risk Factors: Smoking Status: never (10/01/2009)  Past medical, surgical, family and social histories (including risk factors) reviewed, and no changes noted (except as noted below).  Past  Medical History: Reviewed history from 12/20/2007 and no changes required. Hypertension Hypothyroidism GERD carple tunnel Osteoarthritis  Past Surgical History: Reviewed history from 06/08/2007 and no changes required. Cholecystectomy Hysterectomy Tubal ligation fx leg Sinus surgery  Family History: Reviewed history from 06/08/2007 and no changes required. father Family History Lung cancer mother Family History High cholesterol  Social History: Reviewed history from 06/08/2007 and no changes required. Married Never Smoked Alcohol use-no Drug use-no Regular exercise-no  Review of Systems  The patient denies anorexia, fever, weight loss, weight gain, vision loss, decreased hearing, hoarseness, chest pain, syncope, dyspnea on exertion, peripheral edema, prolonged cough, headaches, hemoptysis, abdominal pain, melena, hematochezia, severe indigestion/heartburn, hematuria, incontinence, genital sores, muscle weakness, suspicious skin lesions, transient blindness, difficulty walking, depression, unusual weight change, abnormal bleeding, enlarged lymph nodes, angioedema, and breast masses.    Physical Exam  General:  alert and overweight-appearing.   Head:  normocephalic and no abnormalities palpated.   Eyes:  vision grossly intact and pupils reactive to light.   Ears:  R ear normal and L ear normal.   Nose:  no external deformity and no nasal discharge.   Mouth:  pharynx pink and moist and no erythema.   Neck:  supple and full ROM.   Lungs:  normal respiratory effort and no wheezes.   Heart:  normal rate and regular rhythm.   Abdomen:  soft and non-tender.   Msk:  lumbar lordosis and SI joint tenderness.   Extremities:  No clubbing, cyanosis, edema, or deformity noted with normal full range of motion of all joints.   Psych:  flat affect, subdued, and withdrawn.     Impression & Recommendations:  Problem # 1:  ADHD (ICD-314.01) due to the adhd she ma require addition of  welbutrin  Problem # 2:  HYPOTHYROIDISM (ICD-244.9)  Her updated medication list for this problem includes:    Synthroid 137 Mcg Tabs (Levothyroxine sodium) ..... Once daily  Labs Reviewed: TSH: 0.77 (03/27/2009)     Orders: TLB-TSH (Thyroid Stimulating Hormone) (84443-TSH) TLB-T4 (Thyrox), Free [phone]) TLB-T3, Free (Triiodothyronine) (84481-T3FREE)  Problem # 3:  HYPERTENSION (ICD-401.9)  Her updated medication list for this problem includes:    Ziac 5-6.25 Mg Tabs (Bisoprolol-hydrochlorothiazide) ..... Once daily  BP today: 136/84 Prior BP: 122/80 (07/30/2009)  Prior 10 Yr Risk Heart Disease: Not enough information (12/20/2007)  Labs Reviewed: K+: 3.7 (03/27/2009) Creat: : 0.4 (03/27/2009)     Complete Medication List: 1)  Synthroid 137 Mcg Tabs (Levothyroxine sodium) .... Once daily 2)  Prevacid 30 Mg Cpdr (Lansoprazole) .... Once daily 3)  Ziac 5-6.25 Mg Tabs (Bisoprolol-hydrochlorothiazide) .... Once daily 4)  Colace 100 Mg Caps (Docusate sodium) .... One by mouth daily 5)  Fluoxetine Hcl 20 Mg Caps (Fluoxetine hcl) .... One by mouth daily 6)  Celebrex 200 Mg Caps (Celecoxib) .... Once daily as needed 7)  Vitamin D 1000 Unit Tabs (Cholecalciferol) .... One by mouth daily  Hypertension Assessment/Plan:      The patient's hypertensive risk group is category B: At least one risk factor (excluding diabetes) with no target organ damage.  Today's blood pressure is 136/84.  Her blood pressure goal is < 140/90.  Patient Instructions: 1)  Please schedule a follow-up appointment in 2 months. Prescriptions: FLUOXETINE HCL 20 MG CAPS (FLUOXETINE HCL) one by mouth daily  #30 x 6   Entered and Authorized by:   Stacie Glaze MD   Signed by:   Stacie Glaze MD on 10/01/2009   Method used:   Electronically to        [facility] (retail)       [address]       [place], [place]  [address]       Ph: [phone]       Fax: [phone]    RxID:   [id number]

## 2010-09-19 NOTE — Assessment & Plan Note (Signed)
Summary: re-excision of bcc   History of Present Illness: no charge reexcission of her prior bx sites  Allergies: No Known Drug Allergies   Impression & Recommendations:  Problem # 1:  CARCINOMA, BASAL CELL, BACK (ICD-173.5) pt was preped in a sterile manor and informed consent obtained. Using a 15 blade the residual lesions was removed and sent for pathology. sterile dressings were applied  and wound care discussed with the pt.  Complete Medication List: 1)  Synthroid 137 Mcg Tabs (Levothyroxine sodium) .... Once daily 2)  Prevacid 30 Mg Cpdr (Lansoprazole) .... Once daily 3)  Ziac 5-6.25 Mg Tabs (Bisoprolol-hydrochlorothiazide) .... Once daily 4)  Colace 100 Mg Caps (Docusate sodium) .... One by mouth daily 5)  Celebrex 200 Mg Caps (Celecoxib) .... Once daily as needed 6)  Vitamin D 1000 Unit Tabs (Cholecalciferol) .... One by mouth daily 7)  Clorazepate Dipotassium 3.75 Mg Tabs (Clorazepate dipotassium) .... One by mouth two times a day  Other Orders: No Charge Patient Arrived (NCPA0) (NCPA0)

## 2010-11-25 ENCOUNTER — Ambulatory Visit: Payer: Self-pay | Admitting: Internal Medicine

## 2010-12-04 ENCOUNTER — Ambulatory Visit: Payer: Self-pay | Admitting: Internal Medicine

## 2010-12-20 ENCOUNTER — Encounter: Payer: Self-pay | Admitting: Internal Medicine

## 2010-12-26 ENCOUNTER — Encounter: Payer: Self-pay | Admitting: Internal Medicine

## 2010-12-26 ENCOUNTER — Ambulatory Visit (INDEPENDENT_AMBULATORY_CARE_PROVIDER_SITE_OTHER): Payer: Self-pay | Admitting: Internal Medicine

## 2010-12-26 VITALS — BP 130/80 | HR 76 | Temp 98.2°F | Resp 14 | Ht 64.0 in | Wt 214.0 lb

## 2010-12-26 DIAGNOSIS — K219 Gastro-esophageal reflux disease without esophagitis: Secondary | ICD-10-CM

## 2010-12-26 DIAGNOSIS — I10 Essential (primary) hypertension: Secondary | ICD-10-CM

## 2010-12-26 DIAGNOSIS — IMO0002 Reserved for concepts with insufficient information to code with codable children: Secondary | ICD-10-CM

## 2010-12-26 DIAGNOSIS — L570 Actinic keratosis: Secondary | ICD-10-CM

## 2010-12-26 DIAGNOSIS — L919 Hypertrophic disorder of the skin, unspecified: Secondary | ICD-10-CM

## 2010-12-26 DIAGNOSIS — L909 Atrophic disorder of skin, unspecified: Secondary | ICD-10-CM

## 2010-12-26 DIAGNOSIS — L918 Other hypertrophic disorders of the skin: Secondary | ICD-10-CM

## 2010-12-26 NOTE — Assessment & Plan Note (Signed)
Mid back pain with radiation to the left side Needs stretching Weight loss Strengthening exercizes

## 2010-12-26 NOTE — Progress Notes (Signed)
Subjective:    Patient ID: Paige [name], female    DOB: 18-Feb-1951, 60 y.o.   MRN: [id number]  HPI Ulcerated lesion on her right cheek ( BCCA) and multiple skin tags Has a job and anxiety is better HTN stable on medications GERD intermitantly flair with missed medcation  Review of Systems  Constitutional: Negative for activity change, appetite change and fatigue.  HENT: Negative for ear pain, congestion, neck pain, postnasal drip and sinus pressure.   Eyes: Negative for redness and visual disturbance.  Respiratory: Negative for cough, shortness of breath and wheezing.   Gastrointestinal: Negative for abdominal pain and abdominal distention.  Genitourinary: Negative for dysuria, frequency and menstrual problem.  Musculoskeletal: Negative for myalgias, joint swelling and arthralgias.  Skin: Negative for rash and wound.  Neurological: Negative for dizziness, weakness and headaches.  Hematological: Negative for adenopathy. Does not bruise/bleed easily.  Psychiatric/Behavioral: Negative for sleep disturbance and decreased concentration.       Past Medical History  Diagnosis Date  . Hypertension   . Thyroid disease   . GERD (gastroesophageal reflux disease)   . Arthritis   . Carpal tunnel syndrome    Past Surgical History  Procedure Date  . Cholecystectomy   . Abdominal hysterectomy   . Tubal ligation   . Fx leg   . Nasal sinus surgery     reports that she has never smoked. She does not have any smokeless tobacco history on file. She reports that she does not drink alcohol or use illicit drugs. family history includes Cancer in her father; Hyperlipidemia in her mother; and Lung cancer in an unspecified family member. No Known Allergies  Objective:   Physical Exam  Constitutional: She is oriented to person, place, and time. She appears well-developed and well-nourished. No distress.  HENT:  Head: Normocephalic and atraumatic.  Right Ear: External ear normal.  Left  Ear: External ear normal.  Nose: Nose normal.  Mouth/Throat: Oropharynx is clear and moist.  Eyes: Conjunctivae and EOM are normal. Pupils are equal, round, and reactive to light.  Neck: Normal range of motion. Neck supple. No JVD present. No tracheal deviation present. No thyromegaly present.  Cardiovascular: Normal rate, regular rhythm, normal heart sounds and intact distal pulses.   No murmur heard. Pulmonary/Chest: Effort normal and breath sounds normal. She has no wheezes. She exhibits no tenderness.  Abdominal: Soft. Bowel sounds are normal.  Musculoskeletal: Normal range of motion. She exhibits no edema and no tenderness.  Lymphadenopathy:    She has no cervical adenopathy.  Neurological: She is alert and oriented to person, place, and time. She has normal reflexes. No cranial nerve deficit.  Skin: Skin is warm and dry. She is not diaphoretic.       Skin tags under arms bilaterally and around neck.  She has a actinic keratosis with ulceration possibly early basal cell carcinoma on her right cheek approximately 0.6 cm in size  Psychiatric: She has a normal mood and affect. Her behavior is normal.          Assessment & Plan:  Patient's blood pressure is in control we urged weight loss and exercise her stress levels have reduced since she has found employment.  She is irritated skin tags around her neck as she wishes removed and she points to a lesion on her face that was an actinic keratosis and was treated with cryotherapy.  Informed consent was obtained in the lesion was treated for 60 seconds of liquid nitrogen application the patient  tolerated the procedure well as procedural care was discussed with the patient and instructions should the lesion reappears contact our office immediately  We discussed her depression and anxiety was much improved since she has found employment.  This is the third anniversary of her husbands death.  We discussed her tests esophageal reflux and  the fact that compliance is essential to prevent getting and organ damage she should take the proton pump inhibitor on a daily basis

## 2010-12-26 NOTE — Assessment & Plan Note (Signed)
Taking the PPI intermittently and has recurrent symptoms

## 2010-12-26 NOTE — Assessment & Plan Note (Signed)
Blood pressure stable ? ?

## 2011-01-03 NOTE — Op Note (Signed)
NAME:  Paige [name], Paige [name]                      ACCOUNT NO.:  [id number]   MEDICAL RECORD NO.:  [id number]                   PATIENT TYPE:  OIB   LOCATION:  NA                                   FACILITY:  [facility]   PHYSICIAN:  Dionne Ano. Everlene Other, M.D.         DATE OF BIRTH:  07-29-1951   DATE OF PROCEDURE:  12/02/2002  DATE OF DISCHARGE:                                 OPERATIVE REPORT   PREOPERATIVE DIAGNOSIS:  Status post right tibia fracture with retained  hardware.   POSTOPERATIVE DIAGNOSIS:  Status post right tibia fracture with retained  hardware.   PROCEDURES:  1. Removal of deep hardware, right tibia.  2. Stress radiography.   SURGEON:  Dionne Ano. Amanda Pea, M.D.   COMPLICATIONS:  None.   ANESTHESIA:  General.   TOURNIQUET TIME:  Less than one hour.   ESTIMATED BLOOD LOSS:  Minimal.   DRAINS:  None.   INDICATION FOR PROCEDURE:  The patient is a 60 year old female who presents  with the above-mentioned diagnosis.  She has healed her tibia fracture  wonderfully but still has retained hardware and desires removal due to pain.  I have discussed the risks and benefits of surgery including risks of  infection, bleeding, damage to normal structures, and failure of the surgery  to accomplish its intended goals of relieving symptoms and restoring  function.  With this in mind, she desires to proceed.  All questions have  been encouraged and answered preoperatively.   OPERATIVE FINDINGS:  The patient had retained hardware in the right tibia.  She underwent removal of her Rush rod without difficulties.  Stress  radiography revealed complete fracture healing and good range of motion of  the knee and ankle.   DESCRIPTION OF PROCEDURE:  The patient was seen by myself and anesthesia,  taken to the operative suite, given preoperative antibiotics, was laid  supine, appropriately padded, and prepped and draped in the usual sterile  fashion after general anesthesia was induced  by Burna Forts, M.D.  Once the sterile field was secured about the right lower extremity, the  patient had the tourniquet insufflated to 350 mmHg and an incision was made  along a previously anterior medial incision.  Dissection was carried down  through the skin with knife blade.  Dissection was carried down deeply just  off the medial border of the patellar tendon.  I then identified the old  Rush rod and mobilized this with a Glorious Peach as well as combination needle-nose  pliers and hemostat.  Following this I performed a placement of a Rush rod  removal instrument and then removed the Rush rod deeply.  Once this was done  I irrigated the wound copiously, deflated the tourniquet, and placed bone  wax over the entry site of the nail and then performed stress radiography,  revealing excellent fracture fixation and good range of motion in the knee  and ankle.  Once this was done, the  wound was then closed in layers with #2  Tevdek about the deep layer involving the patellar tendon interval, and then  I sutured the wound with 2-0 Vicryl in the subcu and then performed a  running subcuticular in the skin edge, followed by side-to-side and  application of sterile dressing.  Marcaine with epinephrine approximately 20  mL was placed for postop analgesia, and the patient tolerated this well.  Once this was done, I then wrapped the knee and then placed it in a knee  immobilizer.  She was discharged home on Percocet, Robaxin, and will return  to see [place] in 10 days for suture removal and we will make sure her motion is  coming along nicely.  I have discussed with her range of motion of the knee  and ankle and other precautions.  She is allergic to aspirin, and we will  not perform any significant DVT prophylaxis at this juncture.  We have  discussed with the patient all issues.  She has a stable tibia and can  perform full weightbearing immediately.  All questions have been encouraged  and  answered.                                                Dionne Ano. Everlene Other, M.D.    Nash Mantis  D:  12/02/2002  T:  12/03/2002  Job:  [id number]

## 2011-01-03 NOTE — Discharge Summary (Signed)
[place]. [facility]  Patient:    Paige [name], Paige [name] Visit Number: [id number] MRN: [id number]          Service Type: SUR Location: 4700 [id number] Attending Physician:  Dominica Severin Dictated by:   Sammuel Cooper. Mahar, P.A.C. Admit Date:  09/10/2001 Discharge Date: 09/15/2001                             Discharge Summary  DATE OF BIRTH:  1950/11/27.  ADMISSION DIAGNOSES: 1. Right tibia-fibula fracture. 2. Hypothyroidism.  DISCHARGE DIAGNOSES: 1. Status post open reduction and internal fixation of right tibia-fibula    fracture utilizing intramedullary nailing to the right tibia. 2. Hypothyroidism.  PROCEDURE:  Closed reduction and intramedullary nailing of the right tibia and closed treatment of the right fibula fracture.  This was done by Elisha Ponder, M.D., with the assistance of Hampton L. Wynona Neat, P.A.-C.  Anesthesia used was general.  CONSULTS:  None.  BRIEF HISTORY: The patient is a 59 year old white female status post fall while walking down some stairs earlier in the evening prior to being admitted to [facility]. She felt a pop and pain after the fall. She noted right lower extremity pain only; denies any neck, back, chest or abdominal pain.  Denies any head trauma or loss of consciousness.  After being evaluated in the emergency room, Dr. Amanda Pea was consulted for orthopedic evaluation and the patient was found to have closed tib-fib fracture to the right lower extremity and admitted for operative management.  LABS:  On September 11, 2001, blood gas showed pH 7.409, pCO2 46.7, bicarb 30.0, total CO2 was 31 and base axis 5.0.  CBC within normal limits with the exception of wbc of 13.9 and hematocrit 34.7.  Diff. was neutrophils of 80, absolute neutrophils 11.2, otherwise normal.  CBC from September 13, 2001, within normal limits with the except of hemoglobin and hematocrit decreased to 11.3 and 31.9, respectively.  On  September 14, 2001, rbc was 3.63, hemoglobin 10.5, hematocrit 29.7, otherwise normal.  On September 15, 2001, rbc 3.64, hemoglobin 10.4, and hematocrit 29.9, otherwise normal.  PT and INR were monitored postoperatively while on Coumadin, initially 15.6 and 1.3 on September 12, 2001, increasing up to 19.2 and 1.9 on September 14, 2001; reaching therapeutic on September 15, 2001, at 22.5 and 2.5, respectively.  Basic metabolic panel from September 11, 2001, initially potassium 5.7, glucose 143. This was repeated later to show a potassium of 3.3 and glucose of 204, otherwise within normal limits.  X-RAYS:  Right tib-fib oblique fractures to proximal right fibula and mid distal right tibia with tibial fracture one half shaft with posterior lateral displacement and fibular fracture one shaft with anterior medial displacement associated with soft tissue swelling.  Chest revealed cardiomegaly with pulmonary vascular congestion, minimal interstitial edema not excluded.  Right knee showed proximal right fibula fracture again noted, the remainder of right knee grossly unremarkable but a true lateral was not obtained.  Further evaluation for full series recommended when able.  Right ankle showed distal tibia fracture as previously described.  Soft tissue swelling over medial malleolus.  Repeat right tibia status post revealed medullary rod in the right tibia, transversing distal tibia fracture, near anatomic alignment and position.  EKG from September 11, 2001, revealed sinus tachycardia, ST abnormality, possible digitalis effect, no old tracing, confirmed by Runell Gess, M.D.  HOSPITAL COURSE:  Following admission, the  patient was taken to the operating room on September 11, 2001, for the above listed procedure done by Dr. Dominica Severin. There were no intraoperative complications. She tolerated the surgery very well. She was transferred to the recovery room in stable condition. Estimated blood loss was  less than 100 cc.  There was no tourniquet used. Postoperatively, the patient was started on appropriate antibiotics and completed this course without any difficulty.  Initially, pain control was obtained utilizing combination of p.o. analgesics as well as morphine IV for breakthrough pain.  Pain control remained adequate.  Eventually she was transitioned over to strictly p.o. analgesics without any difficulty or complications.  Diet was advanced as tolerated without any difficulty. Bedside incentive spirometry was utilized through postoperative course. Neurovascular checks were instituted postoperatively. The patient did have some difficulty with EHL function and great toe extension.  Sensation was grossly intact. The patient had good capillary refill, less than two seconds, throughout the hospital stay.  Physical therapy and occupational therapy were consulted to assist the patient with transfers as well as gait training and progressive ambulation. She progressed along with them relatively well and got to the point where she was able to ambulate in excess of 50 feet well maintaining her nonweightbearing status to the right lower extremity.  She also did well with transfers.  Prior to being discharged on September 12, 2001, the splint that was placed in the operating room was taken down by Dr. Amanda Pea. I checked to make sure the patients compartments were soft. They were. There were no problems, no signs or symptoms of compartment syndrome. Postoperatively, the patient was started on Coumadin. She was not able to tolerate aspirin for DVT prophylaxis.  She will be continued for approximately six to eight weeks while the patient is with a decreased ambulation and mobility. Discharge planning consult was ordered to assist the patient with setting up her home needs and equipment as well as arranging home health physical therapy and nursing.  By September 15, 2001, the patient was doing well with  physical therapy. She had met orthopedic goals. She was medically stable and ready for discharge to her home. The patients home needs had been  arranged by discharge planning. She was afebrile, vital signs were stable. She was neurovascularly intact.  She was moving her toes without difficulty; however, she still was having some difficulty with great toe extension.  Her sensation was intact to light touch. The splint was clean, dry and intact.  DISCHARGE DIAGNOSIS: Status post right tibia-fibula fracture, closed reduction intramedullary nailing to the right lower extremity.  ACTIVITY:  Strict nonweightbearing to the right lower extremity. She is to keep her foot elevated above the level of the heart as much as possible.  Keep the splint, dry and intact until her follow-up appointment.  FOLLOW-UP:  She is to follow up with Dr. Dominica Severin Friday or Monday after discharge.  Instructed to call the office to set up an appointment time.  MEDICATIONS ON DISCHARGE:  Coumadin dosed per pharmacy, will be managed per [facility], Percocet and Robaxin.  DIET:  Preoperative diet as tolerated.  CONDITION ON DISCHARGE:  Stable and improved.  DISPOSITION:  The patient is being discharged to home with her family as well as home health physical therapy and nursing.  Home health nursing is instructed to check a PT and INR on Friday, September 17, 2001. Dictated by:   Sammuel Cooper. Mahar, P.A.C. Attending Physician:  Dominica Severin DD:  10/19/01 TD:  10/20/01 Job: [id number]

## 2011-01-03 NOTE — Op Note (Signed)
[place]. [facility]  Patient:    Paige [name], Paige [name] Visit Number: [id number] MRN: [id number]          Service Type: SUR Location: 4700 4715 01 Attending Physician:  Dominica Severin Dictated by:   Elisha Ponder, M.D. Proc. Date: 09/11/01 Admit Date:  09/10/2001 Discharge Date: 09/15/2001                             Operative Report  DATE OF BIRTH:  [birth year]  PREOPERATIVE DIAGNOSIS:  Displaced closed right tibia-fibula fracture.  POSTOPERATIVE DIAGNOSIS:  Displaced closed right tibia-fibula fracture.  PROCEDURES:  Closed reduction and intermedullary nailing of right tibia fracture and closed treatment of right fibula fracture.  SURGEON:  Elisha Ponder, M.D.  ASSISTANT:  Ottie Glazier. Wynona Neat, P.A.-C.  COMPLICATIONS:  None.  ANESTHESIA:  General.  TOURNIQUET TIME:  Zero.  ESTIMATED BLOOD LOSS:  Less than 100 cc.  INDICATIONS:  This patient is a very pleasant white female who is in her early 52s and sustained a fall today on the ice with a resultant closed displaced tibia-fibula fracture.  She complained of significant pain complaints.  She was appropriately evaluated in the emergency room and following this taken to surgery for the above-mentioned surgical procedure. The risks and benefits of surgery were discussed here at length, including the risks of infection, bleeding, anesthesia, damage to normal structures, and failure of the surgery to accomplish the intended goals of relieving tendons and restoring motion.  The risks of fat embolus syndrome, PE, DVT, and other fracture healing complications were discussed with her as well.  She desires to proceed.  The patient and I had a preoperative discussion in regards to her very small canal and the fact that we may have to place and unreamed type medullary nail down versus external fixator, etc., based on intraoperative findings.  OPERATIVE FINDINGS:  The patient had a very small canal  diameter approximately 4-5 mm at best.  The patient had closed reduction and IM nailing with Rush rod.  The patient would not accommodate the smallest interlocking nail, which was an 8 mm nail which would require reaming up to an 8.5 or 9 mm size.  The patient absolutely would not accommodate this type of reamer and thus to prevent thermal necrosis and damage to the tibia. I elected to proceed with unreamed Rush rod fixation which provided excellent canal fit.  OPERATION IN DETAIL:  The patient was seen by myself and anesthesia and was taken to the operative suite.  She underwent general endotracheal anesthesia and was given preoperative Ancef.  Following this, the patient was laid with the spine appropriately padded and prepped and draped in the usual sterile fashion about the right lower extremity with tourniquet applied, but not inflated.  Once this was done, the patient had an incision made with the tourniquet down about the medial parapatellar region.  Dissection was carried down just medial to the patellar tendon.  I elevated the tendon slightly and then entered the bare area with awl under AP and lateral radiographic projections.  I was able to enlarge the awl canal nicely.  Following this a guide wire was placed.  This was a ball-tipped guide wire.  The fascia was reduced.  I then assessed the canal fit, etc.  The 2.5 guide wire basically filled the canal for the most part.  I took intraoperative measurements and with exacting technique determined that an 8 mm  nail, which was the smallest locking nail would absolutely not accommodate this canal and felt that attempted reaming up to an 8.5 or 9 mm reamer would only cause thermal necrosis and possible damage.  Thus, at this point in time I elected to place an unlocked Rush rod solid nail.  This was done to my satisfaction without difficulty.  I reduced the fracture site nicely after removing the ball tip with guide wire and placed a  Rush rod, which was 11 inches in length and 3/16 inch in width.  The fracture reduced nicely.  Adjustments were made accordingly.  On AP and lateral films, the fracture interdigitated and interlocked nicely.  I was very pleased with this and the overall fit.  The tibial tubercle and second metatarsal aligned well with each head.  There was no obvious rotatory malalignment.  At this point in time, the compartments were checked.  They were soft.  An excellent pulse was noted.  The patient then had the wound copiously irrigated, broad seated, and final x-rays taken. After copious irrigation, the wound was closed with Vicryl and the skin edges were closed with subcuticular Prolene.  Marcaine with epinephrine was injected for postoperative pain relief in the skin edge.  Steri-Strips were applied to the wound.  Following this, a long-leg splint was applied without difficulty with the knee bent at 30-45 degrees.  The patient had excellent refill, good pulse, and no immediate intraoperative complications.  Given the patients weight and fixation, etc., I am going to place her on DVT prophylaxis in the form of Coumadin postoperatively.  She has an allergy to aspirin and I do want to avoid any postoperative DVT complications given her age, weight, and overall issues.  We will have her nonweightbearing initially and then convert her to a functional fracture brace that she can bear weight in as she is progressing along with her healing etc.  I have discussed with the family all issues.  All questions were encouraged and answered. Dictated by:   Elisha Ponder, M.D. Attending Physician:  Dominica Severin DD:  09/11/01 TD:  09/12/01 Job: [id number]

## 2011-01-19 ENCOUNTER — Other Ambulatory Visit: Payer: Self-pay | Admitting: Internal Medicine

## 2011-03-09 ENCOUNTER — Other Ambulatory Visit: Payer: Self-pay | Admitting: Internal Medicine

## 2011-03-27 ENCOUNTER — Ambulatory Visit: Payer: Self-pay | Admitting: Internal Medicine

## 2011-03-28 ENCOUNTER — Ambulatory Visit (INDEPENDENT_AMBULATORY_CARE_PROVIDER_SITE_OTHER): Payer: Self-pay | Admitting: Internal Medicine

## 2011-03-28 ENCOUNTER — Encounter: Payer: Self-pay | Admitting: Internal Medicine

## 2011-03-28 VITALS — BP 124/82 | HR 76 | Temp 98.2°F | Resp 14 | Ht 63.5 in | Wt 208.0 lb

## 2011-03-28 DIAGNOSIS — F909 Attention-deficit hyperactivity disorder, unspecified type: Secondary | ICD-10-CM

## 2011-03-28 MED ORDER — LISDEXAMFETAMINE DIMESYLATE 50 MG PO CAPS: 50.0000 mg | ORAL_CAPSULE | ORAL | Status: DC

## 2011-04-02 ENCOUNTER — Encounter: Payer: Self-pay | Admitting: Internal Medicine

## 2011-04-02 NOTE — Progress Notes (Signed)
Subjective:    Patient ID: Paige [name], female    DOB: 1950/10/09, 60 y.o.   MRN: [id number]  HPI  Patient is a 60 year old  white female who presents for followup of hypertension hypothyroidism and hyperlipidemia.  She presents today without complaints she does have some persistent dizziness and has been referred to the physical therapy program for chronic labyrinthine dizziness.  He has a history of a generalized anxiety disorder that seems to be an reasonable control she also has a history of obstructive sleep apnea and wears CPAP  Review of Systems  Constitutional: Negative for activity change, appetite change and fatigue.  HENT: Negative for ear pain, congestion, neck pain, postnasal drip and sinus pressure.   Eyes: Negative for redness and visual disturbance.  Respiratory: Negative for cough, shortness of breath and wheezing.   Gastrointestinal: Negative for abdominal pain and abdominal distention.  Genitourinary: Negative for dysuria, frequency and menstrual problem.  Musculoskeletal: Negative for myalgias, joint swelling and arthralgias.  Skin: Negative for rash and wound.  Neurological: Negative for dizziness, weakness and headaches.  Hematological: Negative for adenopathy. Does not bruise/bleed easily.  Psychiatric/Behavioral: Negative for sleep disturbance and decreased concentration.       Past Medical History  Diagnosis Date  . Hypertension   . Thyroid disease   . GERD (gastroesophageal reflux disease)   . Arthritis   . Carpal tunnel syndrome    Past Surgical History  Procedure Date  . Cholecystectomy   . Abdominal hysterectomy   . Tubal ligation   . Fx leg   . Nasal sinus surgery     reports that she has never smoked. She does not have any smokeless tobacco history on file. She reports that she does not drink alcohol or use illicit drugs. family history includes Cancer in her father; Hyperlipidemia in her mother; and Lung cancer in an unspecified  family member. No Known Allergies  Objective:   Physical Exam  Constitutional: She is oriented to person, place, and time. She appears well-developed and well-nourished. No distress.  HENT:  Head: Normocephalic and atraumatic.  Right Ear: External ear normal.  Left Ear: External ear normal.  Nose: Nose normal.  Mouth/Throat: Oropharynx is clear and moist.  Eyes: Conjunctivae and EOM are normal. Pupils are equal, round, and reactive to light.  Neck: Normal range of motion. Neck supple. No JVD present. No tracheal deviation present. No thyromegaly present.  Cardiovascular: Normal rate, regular rhythm, normal heart sounds and intact distal pulses.   No murmur heard. Pulmonary/Chest: Effort normal and breath sounds normal. She has no wheezes. She exhibits no tenderness.  Abdominal: Soft. Bowel sounds are normal.  Musculoskeletal: Normal range of motion. She exhibits no edema and no tenderness.  Lymphadenopathy:    She has no cervical adenopathy.  Neurological: She is alert and oriented to person, place, and time. She has normal reflexes. No cranial nerve deficit.  Skin: Skin is warm and dry. She is not diaphoretic.       Skin tags under arms bilaterally and around neck.  She has a actinic keratosis with ulceration possibly early basal cell carcinoma on her right cheek approximately 0.6 cm in size  Psychiatric: She has a normal mood and affect. Her behavior is normal.          Assessment & Plan:  Cryotherapy of actinic keratosis on her right cheek.  Patient gave informed consent and cryotherapy was applied to a 0.6 cm lesion on the right cheek that is felt to be  an actinic keratosis possibly early basal cell carcinoma after care instructions given to the patient should the lesion recurs she would need to be referred to dermatological surgeon.  Patient understood instructions.  Patient's hypertension is stable on her current medications her hypothyroidism will be monitored with  appropriate laboratory values including a TSH T3 free T4 uptake her lipid be monitored at her Medicare wellness examination

## 2011-04-09 ENCOUNTER — Telehealth: Payer: Self-pay | Admitting: Internal Medicine

## 2011-04-09 NOTE — Telephone Encounter (Signed)
Pt called and said that the lisdexamfetamine (VYVANSE) 50 MG capsule is too expensive,even with coupon. This med is $172. Need cheaper alternative.

## 2011-04-14 ENCOUNTER — Other Ambulatory Visit: Payer: Self-pay | Admitting: *Deleted

## 2011-04-14 MED ORDER — AMPHETAMINE-DEXTROAMPHETAMINE 20 MG PO TABS: 20.0000 mg | ORAL_TABLET | Freq: Every day | ORAL | Status: DC

## 2011-04-14 NOTE — Telephone Encounter (Signed)
Per dr Lovell Sheehan- may try adderall 20 mg

## 2011-05-19 ENCOUNTER — Other Ambulatory Visit: Payer: Self-pay | Admitting: *Deleted

## 2011-05-19 MED ORDER — AMPHETAMINE-DEXTROAMPHETAMINE 20 MG PO TABS: 20.0000 mg | ORAL_TABLET | Freq: Every day | ORAL | Status: DC

## 2011-06-30 ENCOUNTER — Ambulatory Visit (INDEPENDENT_AMBULATORY_CARE_PROVIDER_SITE_OTHER): Payer: Self-pay | Admitting: Internal Medicine

## 2011-06-30 ENCOUNTER — Encounter: Payer: Self-pay | Admitting: Internal Medicine

## 2011-06-30 VITALS — BP 124/78 | HR 76 | Temp 98.2°F | Resp 16 | Ht 63.5 in | Wt 197.0 lb

## 2011-06-30 DIAGNOSIS — F909 Attention-deficit hyperactivity disorder, unspecified type: Secondary | ICD-10-CM

## 2011-06-30 DIAGNOSIS — F411 Generalized anxiety disorder: Secondary | ICD-10-CM

## 2011-06-30 DIAGNOSIS — F419 Anxiety disorder, unspecified: Secondary | ICD-10-CM

## 2011-06-30 MED ORDER — AMPHETAMINE-DEXTROAMPHETAMINE 20 MG PO TABS: 20.0000 mg | ORAL_TABLET | Freq: Two times a day (BID) | ORAL | Status: DC

## 2011-06-30 MED ORDER — DIAZEPAM 10 MG PO TABS: 5.0000 mg | ORAL_TABLET | Freq: Two times a day (BID) | ORAL | Status: DC | PRN

## 2011-06-30 NOTE — Patient Instructions (Signed)
The patient is instructed to continue all medications as prescribed. Schedule followup with check out clerk upon leaving the clinic  

## 2011-06-30 NOTE — Progress Notes (Signed)
  Subjective:    Patient ID: Paige [name], female    DOB: 28-Jan-1951, 60 y.o.   MRN: [id number]  HPI She has lost weight... An additional 11 pounds Blood pressure responded to the weight loss Monitoring thyroid ADHD and Sleep apnea... Has improved  Review of Systems  Constitutional: Negative for activity change, appetite change and fatigue.  HENT: Negative for ear pain, congestion, neck pain, postnasal drip and sinus pressure.   Eyes: Negative for redness and visual disturbance.  Respiratory: Negative for cough, shortness of breath and wheezing.   Gastrointestinal: Negative for abdominal pain and abdominal distention.  Genitourinary: Negative for dysuria, frequency and menstrual problem.  Musculoskeletal: Negative for myalgias, joint swelling and arthralgias.  Skin: Negative for rash and wound.  Neurological: Negative for dizziness, weakness and headaches.  Hematological: Negative for adenopathy. Does not bruise/bleed easily.  Psychiatric/Behavioral: Negative for sleep disturbance and decreased concentration.   Past Medical History  Diagnosis Date  . Hypertension   . Thyroid disease   . GERD (gastroesophageal reflux disease)   . Arthritis   . Carpal tunnel syndrome    Past Surgical History  Procedure Date  . Cholecystectomy   . Abdominal hysterectomy   . Tubal ligation   . Fx leg   . Nasal sinus surgery     reports that she has never smoked. She does not have any smokeless tobacco history on file. She reports that she does not drink alcohol or use illicit drugs. family history includes Cancer in her father; Hyperlipidemia in her mother; and Lung cancer in an unspecified family member. No Known Allergies      Objective:   Physical Exam  Nursing note and vitals reviewed. Constitutional: She is oriented to person, place, and time. She appears well-developed and well-nourished. No distress.  HENT:  Head: Normocephalic and atraumatic.  Right Ear: External ear  normal.  Left Ear: External ear normal.  Nose: Nose normal.  Mouth/Throat: Oropharynx is clear and moist.  Eyes: Conjunctivae and EOM are normal. Pupils are equal, round, and reactive to light.  Neck: Normal range of motion. Neck supple. No JVD present. No tracheal deviation present. No thyromegaly present.  Cardiovascular: Normal rate, regular rhythm, normal heart sounds and intact distal pulses.   No murmur heard. Pulmonary/Chest: Effort normal and breath sounds normal. She has no wheezes. She exhibits no tenderness.  Abdominal: Soft. Bowel sounds are normal.  Musculoskeletal: Normal range of motion. She exhibits no edema and no tenderness.  Lymphadenopathy:    She has no cervical adenopathy.  Neurological: She is alert and oriented to person, place, and time. She has normal reflexes. No cranial nerve deficit.  Skin: Skin is warm and dry. She is not diaphoretic.  Psychiatric: She has a normal mood and affect. Her behavior is normal.          Assessment & Plan:  Refill ADD meds Stable HTN weigth loss reveiwed

## 2011-07-25 ENCOUNTER — Telehealth: Payer: Self-pay | Admitting: Internal Medicine

## 2011-07-25 DIAGNOSIS — F909 Attention-deficit hyperactivity disorder, unspecified type: Secondary | ICD-10-CM

## 2011-07-25 MED ORDER — AMPHETAMINE-DEXTROAMPHETAMINE 20 MG PO TABS: 20.0000 mg | ORAL_TABLET | Freq: Two times a day (BID) | ORAL | Status: DC

## 2011-07-25 NOTE — Telephone Encounter (Signed)
Talked with pharmacistnstructe and not sure what happened- pharmacisit instructed to destroy all scripts and new ones will be printed- Left message on machine For pt

## 2011-07-25 NOTE — Telephone Encounter (Signed)
Pt called and said that pharmacy will not let pt fill the amphetamine-dextroamphetamine (ADDERALL, 20MG ,) 20 MG tablet from 06/30/11, because script had written a 31 at bottom at script. So, pharmacy is using 31 days from 06/30/11 instead of her orig script from 06/21/11. Please call [facility] on [address] and tell them that script is ok to fill. Their # is [phone].

## 2011-08-11 ENCOUNTER — Telehealth: Payer: Self-pay

## 2011-08-11 NOTE — Telephone Encounter (Signed)
Pt to try coricidin otc for pt's with hypertension. Pt aware. Pt advised to call back by Friday if symptoms worsen.

## 2011-08-11 NOTE — Telephone Encounter (Signed)
Pt has a scratchy throat and dry hacking cough.  Pt has been taking Mucinex but it is not working.  Pt does not want to come in today.  Pt would like some suggestions on what to take OTC.  Pls advise.

## 2011-08-13 ENCOUNTER — Other Ambulatory Visit: Payer: Self-pay | Admitting: Obstetrics and Gynecology

## 2011-08-13 DIAGNOSIS — Z1231 Encounter for screening mammogram for malignant neoplasm of breast: Secondary | ICD-10-CM

## 2011-08-14 ENCOUNTER — Telehealth: Payer: Self-pay

## 2011-08-14 MED ORDER — AZITHROMYCIN 250 MG PO TABS: ORAL_TABLET | ORAL | Status: AC

## 2011-08-14 NOTE — Telephone Encounter (Signed)
Per dr Lovell Sheehan may have z pack and mucinex cough and flu-take as directed

## 2011-08-14 NOTE — Telephone Encounter (Signed)
Pt called and states she is still not feeling well. Pt has cough, congestion, hoarseness x 1.5 weeks.  Pt states her throat is irritated and she cannot get her voice back.  Pt would like an rx sent to pharmacy. Pls advise.

## 2011-08-14 NOTE — Telephone Encounter (Signed)
Rx sent to pharmacy. Pt aware.  

## 2011-08-15 ENCOUNTER — Ambulatory Visit (HOSPITAL_COMMUNITY)
Admission: RE | Admit: 2011-08-15 | Discharge: 2011-08-15 | Disposition: A | Discharge status: Home / Self Care | Payer: Self-pay | Source: Ambulatory Visit | Attending: Obstetrics and Gynecology | Admitting: Obstetrics and Gynecology

## 2011-08-15 ENCOUNTER — Ambulatory Visit (INDEPENDENT_AMBULATORY_CARE_PROVIDER_SITE_OTHER): Payer: Self-pay | Admitting: *Deleted

## 2011-08-15 VITALS — BP 118/70 | HR 74 | Temp 98.6°F | Resp 18 | Ht 65.0 in | Wt 195.1 lb

## 2011-08-15 DIAGNOSIS — Z1231 Encounter for screening mammogram for malignant neoplasm of breast: Secondary | ICD-10-CM

## 2011-08-15 DIAGNOSIS — Z1239 Encounter for other screening for malignant neoplasm of breast: Secondary | ICD-10-CM

## 2011-08-15 NOTE — Patient Instructions (Signed)
Taught patient how to perform BSE and gave educational materials to take home. Patient did not need a Pap smear today due to last Pap smear was 04/17/09 and normal. Patient has a history of a total abdominal hysterectomy in 1994 for benign reasons. Told patient she will not need any further Pap smears since last Pap smear was normal and has had a hysterectomy. Let patient know will follow up with her within the next couple weeks with results by letter or phone. Patient verbalized understanding.

## 2011-08-15 NOTE — Progress Notes (Signed)
No complaints today.  Pap Smear:    Pap smear not performed today. Patients last Pap smear was 04/17/09 and was normal. Per patient she has no history of abnormal Pap smears. Patient has a history of a total abdominal hysterectomy in 1994 for benign reasons therefore does not need any further Pap smears per BCCCP and ACOG guidelines.  Pap smear result above is in EPIC.  Physical exam: Breasts Left breast slightly larger than right breast which is normal per patient. No skin abnormalities bilateral breasts. No nipple retraction bilateral breasts. No nipple discharge bilateral breasts. No lymphadenopathy. No lumps palpated bilateral breasts.  No complaints of pain or tenderness on palpation.          Pelvic/Bimanual No Pap smear completed today since last Pap smear was 04/17/09 and normal. Pap smear not indicated per BCCCP guidelines. See above note.

## 2011-08-19 HISTORY — PX: COLONOSCOPY: SHX174

## 2011-08-19 HISTORY — PX: POLYPECTOMY: SHX149

## 2011-09-08 ENCOUNTER — Encounter: Payer: Self-pay | Admitting: Family

## 2011-09-08 ENCOUNTER — Ambulatory Visit (INDEPENDENT_AMBULATORY_CARE_PROVIDER_SITE_OTHER): Payer: Self-pay | Admitting: Family

## 2011-09-08 ENCOUNTER — Telehealth: Payer: Self-pay | Admitting: *Deleted

## 2011-09-08 VITALS — BP 126/80 | Temp 98.9°F | Wt 191.0 lb

## 2011-09-08 DIAGNOSIS — H6980 Other specified disorders of Eustachian tube, unspecified ear: Secondary | ICD-10-CM

## 2011-09-08 DIAGNOSIS — J019 Acute sinusitis, unspecified: Secondary | ICD-10-CM

## 2011-09-08 MED ORDER — AMOXICILLIN-POT CLAVULANATE 875-125 MG PO TABS: 1.0000 | ORAL_TABLET | Freq: Two times a day (BID) | ORAL | Status: AC

## 2011-09-08 NOTE — Patient Instructions (Signed)
1. Zyrtec or Claritin OTC once daily   Sinusitis Sinuses are air pockets within the bones of your face. The growth of bacteria within a sinus leads to infection. The infection prevents the sinuses from draining. This infection is called sinusitis. SYMPTOMS  There will be different areas of pain depending on which sinuses have become infected.  The maxillary sinuses often produce pain beneath the eyes.   Frontal sinusitis may cause pain in the middle of the forehead and above the eyes.  Other problems (symptoms) include:  Toothaches.   Colored, pus-like (purulent) drainage from the nose.   Swelling, warmth, and tenderness over the sinus areas may be signs of infection.  TREATMENT  Sinusitis is most often determined by an exam.X-rays may be taken. If x-rays have been taken, make sure you obtain your results or find out how you are to obtain them. Your caregiver may give you medications (antibiotics). These are medications that will help kill the bacteria causing the infection. You may also be given a medication (decongestant) that helps to reduce sinus swelling.  HOME CARE INSTRUCTIONS   Only take over-the-counter or prescription medicines for pain, discomfort, or fever as directed by your caregiver.   Drink extra fluids. Fluids help thin the mucus so your sinuses can drain more easily.   Applying either moist heat or ice packs to the sinus areas may help relieve discomfort.   Use saline nasal sprays to help moisten your sinuses. The sprays can be found at your local drugstore.  SEEK IMMEDIATE MEDICAL CARE IF:  You have a fever.   You have increasing pain, severe headaches, or toothache.   You have nausea, vomiting, or drowsiness.   You develop unusual swelling around the face or trouble seeing.  MAKE SURE YOU:   Understand these instructions.   Will watch your condition.   Will get help right away if you are not doing well or get worse.  Document Released: 08/04/2005  Document Revised: 04/16/2011 Document Reviewed: 03/03/2007 [facility] Patient Information 2012 [place], [place].

## 2011-09-08 NOTE — Telephone Encounter (Deleted)
Pt is complaining of URI, sinus congestion, sore throat, earache, and would like a RX.  No fever.

## 2011-09-08 NOTE — Telephone Encounter (Signed)
Delete message.  She is coming in to see Padonda.

## 2011-09-08 NOTE — Progress Notes (Signed)
Subjective:    Patient ID: Paige [name], female    DOB: 1951-06-21, 61 y.o.   MRN: [id number]  HPI Comments: C/o sinus and bilat ear pressure, sorethroat, nasal congestion, dry cough x three days. Last episode lasting two weeks in Dec unrelieved with zpack. Taking otc mucinex and generic cold and sinus medication. Nonsmoker. Works at [employer].   Sinusitis Associated symptoms include congestion, ear pain and sinus pressure. Pertinent negatives include no chills or sneezing.      Review of Systems  Constitutional: Negative for chills, activity change and appetite change.  HENT: Positive for ear pain, congestion, postnasal drip and sinus pressure. Negative for facial swelling, sneezing and tinnitus.   Respiratory: Negative.   Cardiovascular: Negative.    Past Medical History  Diagnosis Date  . Hypertension   . Thyroid disease   . GERD (gastroesophageal reflux disease)   . Arthritis   . Carpal tunnel syndrome     History   Social History  . Marital Status: Married    Spouse Name: N/A    Number of Children: N/A  . Years of Education: N/A   Occupational History  . retired    Social History Main Topics  . Smoking status: Never Smoker   . Smokeless tobacco: Not on file  . Alcohol Use: No  . Drug Use: No  . Sexually Active: No   Other Topics Concern  . Not on file   Social History Narrative  . No narrative on file    Past Surgical History  Procedure Date  . Cholecystectomy   . Abdominal hysterectomy   . Tubal ligation   . Fx leg   . Nasal sinus surgery     Family History  Problem Relation Age of Onset  . Lung cancer    . Hyperlipidemia Mother   . Cancer Father     No Known Allergies  Current Outpatient Prescriptions on File Prior to Visit  Medication Sig Dispense Refill  . amphetamine-dextroamphetamine (ADDERALL, 20MG ,) 20 MG tablet Take 1 tablet (20 mg total) by mouth 2 (two) times daily.  60 tablet  0  . bisoprolol-hydrochlorothiazide  (ZIAC) 5-6.25 MG per tablet TAKE ONE TABLET BY MOUTH EVERY DAY  30 tablet  11  . celecoxib (CELEBREX) 200 MG capsule Take 200 mg by mouth daily as needed.        . Cholecalciferol (VITAMIN D HIGH POTENCY) 1000 UNITS capsule Take 1,000 Units by mouth daily.        . diazepam (VALIUM) 10 MG tablet Take 0.5 tablets (5 mg total) by mouth every 12 (twelve) hours as needed.  30 tablet  0  . docusate sodium (COLACE) 100 MG capsule Take 100 mg by mouth daily.        . lansoprazole (PREVACID) 30 MG capsule Take 30 mg by mouth daily.        Marland Kitchen levothyroxine (SYNTHROID, LEVOTHROID) 137 MCG tablet TAKE ONE TABLET BY MOUTH EVERY DAY  30 tablet  11    BP 126/80  Temp(Src) 98.9 F (37.2 C) (Oral)  Wt 191 lb (86.637 kg)chart     Objective:   Physical Exam  Constitutional: She is oriented to person, place, and time. She appears well-developed and well-nourished.  Cardiovascular: Normal rate, normal heart sounds and intact distal pulses.  Exam reveals no gallop and no friction rub.   No murmur heard. Pulmonary/Chest: Effort normal and breath sounds normal. No respiratory distress. She has no wheezes. She has no rales. She exhibits no  tenderness.  Neurological: She is alert and oriented to person, place, and time.  Skin: Skin is warm and dry.          Assessment & Plan:  Assessment Sinusitis  Plan: augmentin, rest, and increase po fluids

## 2011-09-23 ENCOUNTER — Other Ambulatory Visit: Payer: Self-pay | Admitting: *Deleted

## 2011-10-03 ENCOUNTER — Ambulatory Visit: Payer: Self-pay | Admitting: Internal Medicine

## 2011-10-06 ENCOUNTER — Ambulatory Visit (INDEPENDENT_AMBULATORY_CARE_PROVIDER_SITE_OTHER): Payer: Self-pay | Admitting: Internal Medicine

## 2011-10-06 ENCOUNTER — Ambulatory Visit: Payer: Self-pay | Admitting: Internal Medicine

## 2011-10-06 ENCOUNTER — Encounter: Payer: Self-pay | Admitting: Internal Medicine

## 2011-10-06 VITALS — BP 134/74 | HR 72 | Temp 98.8°F | Resp 16 | Ht 63.5 in | Wt 188.0 lb

## 2011-10-06 DIAGNOSIS — F411 Generalized anxiety disorder: Secondary | ICD-10-CM

## 2011-10-06 DIAGNOSIS — I10 Essential (primary) hypertension: Secondary | ICD-10-CM

## 2011-10-06 DIAGNOSIS — E039 Hypothyroidism, unspecified: Secondary | ICD-10-CM

## 2011-10-06 DIAGNOSIS — F909 Attention-deficit hyperactivity disorder, unspecified type: Secondary | ICD-10-CM

## 2011-10-06 MED ORDER — CITALOPRAM HYDROBROMIDE 20 MG PO TABS: 20.0000 mg | ORAL_TABLET | Freq: Every day | ORAL | Status: DC

## 2011-10-06 MED ORDER — AMPHETAMINE-DEXTROAMPHETAMINE 20 MG PO TABS: 20.0000 mg | ORAL_TABLET | Freq: Two times a day (BID) | ORAL | Status: DC

## 2011-10-06 NOTE — Patient Instructions (Signed)
Get Dimetapp or Allegra-D and take it twice a day for the next week to get rid of the fluid behind the ear and in the sinuses.

## 2011-10-06 NOTE — Progress Notes (Signed)
  Subjective:    Patient ID: Paige [name], female    DOB: 09/02/1950, 61 y.o.   MRN: [id number]  HPI  Patient presents for followup of anxiety disorder ADHD and hypertension.  She is now working in the school system and this has helped significantly with her depression and anxiety but she still states that she has some issues with anger and frustration that affect her work place demeanor  Review of Systems  Constitutional: Negative for activity change, appetite change and fatigue.  HENT: Negative for ear pain, congestion, neck pain, postnasal drip and sinus pressure.   Eyes: Negative for redness and visual disturbance.  Respiratory: Negative for cough, shortness of breath and wheezing.   Gastrointestinal: Negative for abdominal pain and abdominal distention.  Genitourinary: Negative for dysuria, frequency and menstrual problem.  Musculoskeletal: Negative for myalgias, joint swelling and arthralgias.  Skin: Negative for rash and wound.  Neurological: Negative for dizziness, weakness and headaches.  Hematological: Negative for adenopathy. Does not bruise/bleed easily.  Psychiatric/Behavioral: Negative for sleep disturbance and decreased concentration.       Objective:   Physical Exam  Nursing note and vitals reviewed. Constitutional: She is oriented to person, place, and time. She appears well-developed and well-nourished. No distress.  HENT:  Head: Normocephalic and atraumatic.  Right Ear: External ear normal.  Left Ear: External ear normal.  Nose: Nose normal.  Mouth/Throat: Oropharynx is clear and moist.  Eyes: Conjunctivae and EOM are normal. Pupils are equal, round, and reactive to light.  Neck: Normal range of motion. Neck supple. No JVD present. No tracheal deviation present. No thyromegaly present.  Cardiovascular: Normal rate, regular rhythm, normal heart sounds and intact distal pulses.   No murmur heard. Pulmonary/Chest: Effort normal and breath sounds normal. She  has no wheezes. She exhibits no tenderness.  Abdominal: Soft. Bowel sounds are normal.  Musculoskeletal: Normal range of motion. She exhibits no edema and no tenderness.  Lymphadenopathy:    She has no cervical adenopathy.  Neurological: She is alert and oriented to person, place, and time. She has normal reflexes. No cranial nerve deficit.  Skin: Skin is warm and dry. She is not diaphoretic.  Psychiatric: She has a normal mood and affect. Her behavior is normal.          Assessment & Plan:  We discussed the fact that the Adderall may be contributing to some of the work place issues. It is difficult to know whether or not the outer roll is insufficient in controlling her attention deficit disorder and this is resulting in work place issues or whether or not the Adderall is stimulating her too much and creating some anxiety.  I have asked her to try a "Adderall medication" to see if this is affecting her work place demeanor if symptoms worsen and we will no that the Adderall needs to be increased rather than discontinued.  She is on citalopram 20 mg by mouth daily and we might increase that to tell them to 40 if needed.  Otherwise she is stable from the standpoint of her blood pressure and samples of Celebrex were given to help her arthritis.

## 2011-12-29 ENCOUNTER — Ambulatory Visit (INDEPENDENT_AMBULATORY_CARE_PROVIDER_SITE_OTHER): Payer: Self-pay | Admitting: Internal Medicine

## 2011-12-29 VITALS — BP 130/70 | HR 72 | Temp 98.6°F | Resp 16 | Ht 63.5 in | Wt 182.0 lb

## 2011-12-29 DIAGNOSIS — D649 Anemia, unspecified: Secondary | ICD-10-CM

## 2011-12-29 DIAGNOSIS — E039 Hypothyroidism, unspecified: Secondary | ICD-10-CM

## 2011-12-29 DIAGNOSIS — H6690 Otitis media, unspecified, unspecified ear: Secondary | ICD-10-CM

## 2011-12-29 MED ORDER — NEOMYCIN-POLYMYXIN-HC 3.5-10000-1 OT SOLN: 3.0000 [drp] | Freq: Four times a day (QID) | OTIC | Status: AC

## 2011-12-29 NOTE — Patient Instructions (Signed)
The patient is instructed to continue all medications as prescribed. Schedule followup with check out clerk upon leaving the clinic  

## 2011-12-29 NOTE — Progress Notes (Signed)
  Subjective:    Patient ID: Paige [name], female    DOB: Nov 20, 1950, 61 y.o.   MRN: [id number]  HPI Patient is a 61 year old female who is followed for hypertension and hypothyroidism with a history of generalized anxiety disorder and symptoms of ADHD that have prevented her from successful employment options.  She has been on a treatment protocol and has responded to medications with better focus   Review of Systems  Constitutional: Negative for activity change, appetite change and fatigue.  HENT: Negative for ear pain, congestion, neck pain, postnasal drip and sinus pressure.   Eyes: Negative for redness and visual disturbance.  Respiratory: Negative for cough, shortness of breath and wheezing.   Gastrointestinal: Negative for abdominal pain and abdominal distention.  Genitourinary: Negative for dysuria, frequency and menstrual problem.  Musculoskeletal: Negative for myalgias, joint swelling and arthralgias.  Skin: Negative for rash and wound.  Neurological: Negative for dizziness, weakness and headaches.  Hematological: Negative for adenopathy. Does not bruise/bleed easily.  Psychiatric/Behavioral: Negative for disturbed wake/sleep cycle and decreased concentration.       Objective:   Physical Exam  Nursing note and vitals reviewed. Constitutional: She appears well-developed and well-nourished.  HENT:  Head: Normocephalic and atraumatic.  Eyes: Conjunctivae normal are normal. Pupils are equal, round, and reactive to light.  Neck: Normal range of motion. Neck supple.  Cardiovascular: Normal rate and regular rhythm.   Pulmonary/Chest: Effort normal and breath sounds normal.  Abdominal: Bowel sounds are normal.          Assessment & Plan:  Stable hypertension Refill ADHD per protocol Discuss obesity weight loss and exercise Continue Celebrex for arthritis samples given

## 2011-12-30 LAB — CBC WITH DIFFERENTIAL/PLATELET
Basophils Absolute: 0 10*3/uL (ref 0.0–0.1)
Eosinophils Absolute: 0.2 10*3/uL (ref 0.0–0.7)
Eosinophils Relative: 2.3 % (ref 0.0–5.0)
MCV: 82.8 fl (ref 78.0–100.0)
Monocytes Absolute: 0.5 10*3/uL (ref 0.1–1.0)
Neutrophils Relative %: 63.6 % (ref 43.0–77.0)
Platelets: 183 10*3/uL (ref 150.0–400.0)
WBC: 8.4 10*3/uL (ref 4.5–10.5)

## 2011-12-30 LAB — TSH: TSH: 0.04 u[IU]/mL — ABNORMAL LOW (ref 0.35–5.50)

## 2012-01-05 ENCOUNTER — Ambulatory Visit: Payer: Self-pay | Admitting: Internal Medicine

## 2012-01-16 ENCOUNTER — Telehealth: Payer: Self-pay | Admitting: Internal Medicine

## 2012-01-16 NOTE — Telephone Encounter (Signed)
Left message on machine All labs look good per dr Lovell Sheehan

## 2012-01-16 NOTE — Telephone Encounter (Signed)
Pt requesting results of labs. Pt requesting to have a detailed message left if she does not answer

## 2012-01-22 ENCOUNTER — Other Ambulatory Visit: Payer: Self-pay | Admitting: Internal Medicine

## 2012-02-04 ENCOUNTER — Ambulatory Visit (INDEPENDENT_AMBULATORY_CARE_PROVIDER_SITE_OTHER): Payer: Self-pay | Admitting: Family

## 2012-02-04 ENCOUNTER — Encounter: Payer: Self-pay | Admitting: Family

## 2012-02-04 VITALS — BP 120/70 | Temp 98.6°F | Wt 186.0 lb

## 2012-02-04 DIAGNOSIS — H9203 Otalgia, bilateral: Secondary | ICD-10-CM

## 2012-02-04 DIAGNOSIS — H9209 Otalgia, unspecified ear: Secondary | ICD-10-CM

## 2012-02-04 DIAGNOSIS — J301 Allergic rhinitis due to pollen: Secondary | ICD-10-CM

## 2012-02-04 DIAGNOSIS — I10 Essential (primary) hypertension: Secondary | ICD-10-CM

## 2012-02-04 MED ORDER — METHYLPREDNISOLONE 4 MG PO KIT: PACK | ORAL | Status: AC

## 2012-02-04 NOTE — Progress Notes (Signed)
Subjective:    Patient ID: Paige [name], female    DOB: 28-Aug-1950, 61 y.o.   MRN: [id number]  HPI 61 year old white female, nonsmoker, patient of [name] is in with complaints of ear pain, postnasal drip since December 2012. She's been taken Mucinex periodically that does help her symptoms some. She reports episodes of itchy ears and itchy eyes but has not taken any medicine for it. Denies any environmental changes. No known allergies.   Review of Systems  Constitutional: Negative.   HENT: Positive for congestion, rhinorrhea and postnasal drip.   Eyes: Positive for itching.  Respiratory: Positive for cough.   Neurological: Negative.   Hematological: Negative.    Past Medical History  Diagnosis Date  . Hypertension   . Thyroid disease   . GERD (gastroesophageal reflux disease)   . Arthritis   . Carpal tunnel syndrome     History   Social History  . Marital Status: Married    Spouse Name: N/A    Number of Children: N/A  . Years of Education: N/A   Occupational History  . retired    Social History Main Topics  . Smoking status: Never Smoker   . Smokeless tobacco: Not on file  . Alcohol Use: No  . Drug Use: No  . Sexually Active: No   Other Topics Concern  . Not on file   Social History Narrative  . No narrative on file    Past Surgical History  Procedure Date  . Cholecystectomy   . Abdominal hysterectomy   . Tubal ligation   . Fx leg   . Nasal sinus surgery     Family History  Problem Relation Age of Onset  . Lung cancer    . Hyperlipidemia Mother   . Cancer Father     No Known Allergies  Current Outpatient Prescriptions on File Prior to Visit  Medication Sig Dispense Refill  . amphetamine-dextroamphetamine (ADDERALL, 20MG ,) 20 MG tablet Take 1 tablet (20 mg total) by mouth 2 (two) times daily.  60 tablet  0  . bisoprolol-hydrochlorothiazide (ZIAC) 5-6.25 MG per tablet TAKE ONE TABLET BY MOUTH EVERY DAY  30 tablet  11  . celecoxib  (CELEBREX) 200 MG capsule Take 200 mg by mouth daily as needed.        . citalopram (CELEXA) 20 MG tablet Take 1 tablet (20 mg total) by mouth daily.  30 tablet  6  . diazepam (VALIUM) 10 MG tablet Take 0.5 tablets (5 mg total) by mouth every 12 (twelve) hours as needed.  30 tablet  0  . docusate sodium (COLACE) 100 MG capsule Take 100 mg by mouth daily.        . lansoprazole (PREVACID) 30 MG capsule Take 30 mg by mouth daily.        Marland Kitchen levothyroxine (SYNTHROID, LEVOTHROID) 137 MCG tablet TAKE ONE TABLET BY MOUTH EVERY DAY  30 tablet  11  . Cholecalciferol (VITAMIN D HIGH POTENCY) 1000 UNITS capsule Take 1,000 Units by mouth daily.          BP 120/70  Temp 98.6 F (37 C) (Oral)  Wt 186 lb (84.369 kg)chart    Objective:   Physical Exam  Constitutional: She is oriented to person, place, and time. She appears well-developed and well-nourished.  HENT:  Right Ear: External ear normal.  Left Ear: External ear normal.  Nose: Nose normal.  Mouth/Throat: Oropharynx is clear and moist.  Neck: Normal range of motion. Neck supple.  Cardiovascular: Normal rate,  regular rhythm and normal heart sounds.   Pulmonary/Chest: Effort normal and breath sounds normal.  Musculoskeletal: Normal range of motion.  Neurological: She is alert and oriented to person, place, and time.  Skin: Skin is warm and dry.  Psychiatric: She has a normal mood and affect.          Assessment & Plan:  Assessment: Allergic rhinitis, postnasal drip, hypertension  Plan: Medrol Dosepak as directed. Over-the-counter Claritin, Zyrtec, or Allegra once daily. Patient to call the office if symptoms worsen or persist. Schedule complete physical exam. Recheck when necessary sooner.

## 2012-02-04 NOTE — Patient Instructions (Addendum)
Obtain OTC Allegra or Zyrtec once daily.  Allergic Rhinitis Allergic rhinitis is when the mucous membranes in the nose respond to allergens. Allergens are particles in the air that cause your body to have an allergic reaction. This causes you to release allergic antibodies. Through a chain of events, these eventually cause you to release histamine into the blood stream (hence the use of antihistamines). Although meant to be protective to the body, it is this release that causes your discomfort, such as frequent sneezing, congestion and an itchy runny nose.  CAUSES  The pollen allergens may come from grasses, trees, and weeds. This is seasonal allergic rhinitis, or "hay fever." Other allergens cause year-round allergic rhinitis (perennial allergic rhinitis) such as house dust mite allergen, pet dander and mold spores.  SYMPTOMS   Nasal stuffiness (congestion).   Runny, itchy nose with sneezing and tearing of the eyes.   There is often an itching of the mouth, eyes and ears.  It cannot be cured, but it can be controlled with medications. DIAGNOSIS  If you are unable to determine the offending allergen, skin or blood testing may find it. TREATMENT   Avoid the allergen.   Medications and allergy shots (immunotherapy) can help.   Hay fever may often be treated with antihistamines in pill or nasal spray forms. Antihistamines block the effects of histamine. There are over-the-counter medicines that may help with nasal congestion and swelling around the eyes. Check with your caregiver before taking or giving this medicine.  If the treatment above does not work, there are many new medications your caregiver can prescribe. Stronger medications may be used if initial measures are ineffective. Desensitizing injections can be used if medications and avoidance fails. Desensitization is when a patient is given ongoing shots until the body becomes less sensitive to the allergen. Make sure you follow up with  your caregiver if problems continue. SEEK MEDICAL CARE IF:   You develop fever (more than 100.5 F (38.1 C).   You develop a cough that does not stop easily (persistent).   You have shortness of breath.   You start wheezing.   Symptoms interfere with normal daily activities.  Document Released: 04/29/2001 Document Revised: 07/24/2011 Document Reviewed: 11/08/2008 [facility] Patient Information 2012 [place], [place].

## 2012-03-03 ENCOUNTER — Encounter: Payer: Self-pay | Admitting: Family

## 2012-03-03 ENCOUNTER — Ambulatory Visit (INDEPENDENT_AMBULATORY_CARE_PROVIDER_SITE_OTHER): Payer: Self-pay | Admitting: Family

## 2012-03-03 VITALS — BP 110/70 | Ht 63.0 in | Wt 191.0 lb

## 2012-03-03 DIAGNOSIS — Z0000 Encounter for general adult medical examination without abnormal findings: Secondary | ICD-10-CM

## 2012-03-03 DIAGNOSIS — I10 Essential (primary) hypertension: Secondary | ICD-10-CM

## 2012-03-03 DIAGNOSIS — Z23 Encounter for immunization: Secondary | ICD-10-CM

## 2012-03-03 DIAGNOSIS — E7800 Pure hypercholesterolemia, unspecified: Secondary | ICD-10-CM

## 2012-03-03 DIAGNOSIS — E039 Hypothyroidism, unspecified: Secondary | ICD-10-CM

## 2012-03-03 LAB — LIPID PANEL
Cholesterol: 217 mg/dL — ABNORMAL HIGH (ref 0–200)
Total CHOL/HDL Ratio: 3
Triglycerides: 85 mg/dL (ref 0.0–149.0)

## 2012-03-03 LAB — BASIC METABOLIC PANEL
BUN: 20 mg/dL (ref 6–23)
CO2: 28 mEq/L (ref 19–32)
Calcium: 9 mg/dL (ref 8.4–10.5)
Creatinine, Ser: 0.5 mg/dL (ref 0.4–1.2)
Glucose, Bld: 107 mg/dL — ABNORMAL HIGH (ref 70–99)

## 2012-03-03 NOTE — Patient Instructions (Addendum)

## 2012-03-03 NOTE — Progress Notes (Signed)
Subjective:    Patient ID: Paige [name], female    DOB: November 19, 1950, 61 y.o.   MRN: [id number]  HPI  This is a routine physical examination for this healthy  Female. Reviewed all health maintenance protocols including mammography colonoscopy bone density and reviewed appropriate screening labs. Her immunization history was reviewed as well as her current medications and allergies refills of her chronic medications were given and the plan for yearly health maintenance was discussed all orders and referrals were made as appropriate.   Review of Systems  Constitutional: Negative.   HENT: Negative.   Eyes: Negative.   Respiratory: Negative.   Cardiovascular: Negative.   Gastrointestinal: Negative.   Genitourinary: Negative.   Musculoskeletal: Negative.   Neurological: Negative.   Hematological: Negative.   Psychiatric/Behavioral: Negative.    Past Medical History  Diagnosis Date  . Hypertension   . Thyroid disease   . GERD (gastroesophageal reflux disease)   . Arthritis   . Carpal tunnel syndrome     History   Social History  . Marital Status: Married    Spouse Name: N/A    Number of Children: N/A  . Years of Education: N/A   Occupational History  . retired    Social History Main Topics  . Smoking status: Never Smoker   . Smokeless tobacco: Not on file  . Alcohol Use: No  . Drug Use: No  . Sexually Active: No   Other Topics Concern  . Not on file   Social History Narrative  . No narrative on file    Past Surgical History  Procedure Date  . Cholecystectomy   . Abdominal hysterectomy   . Tubal ligation   . Fx leg   . Nasal sinus surgery     Family History  Problem Relation Age of Onset  . Lung cancer    . Hyperlipidemia Mother   . Cancer Father     No Known Allergies  Current Outpatient Prescriptions on File Prior to Visit  Medication Sig Dispense Refill  . amphetamine-dextroamphetamine (ADDERALL, 20MG ,) 20 MG tablet Take 1 tablet (20 mg  total) by mouth 2 (two) times daily.  60 tablet  0  . bisoprolol-hydrochlorothiazide (ZIAC) 5-6.25 MG per tablet TAKE ONE TABLET BY MOUTH EVERY DAY  30 tablet  11  . celecoxib (CELEBREX) 200 MG capsule Take 200 mg by mouth daily as needed.        . citalopram (CELEXA) 20 MG tablet Take 1 tablet (20 mg total) by mouth daily.  30 tablet  6  . diazepam (VALIUM) 10 MG tablet Take 0.5 tablets (5 mg total) by mouth every 12 (twelve) hours as needed.  30 tablet  0  . docusate sodium (COLACE) 100 MG capsule Take 100 mg by mouth daily.        . lansoprazole (PREVACID) 30 MG capsule Take 30 mg by mouth daily.        Marland Kitchen levothyroxine (SYNTHROID, LEVOTHROID) 137 MCG tablet TAKE ONE TABLET BY MOUTH EVERY DAY  30 tablet  11  . Cholecalciferol (VITAMIN D HIGH POTENCY) 1000 UNITS capsule Take 1,000 Units by mouth daily.          BP 110/70  Ht 5\' 3"  (1.6 m)  Wt 191 lb (86.637 kg)  BMI 33.83 kg/m2chart    Objective:   Physical Exam  Constitutional: She is oriented to person, place, and time. She appears well-developed and well-nourished.  HENT:  Head: Normocephalic and atraumatic.  Right Ear: External ear normal.  Left Ear: External ear normal.  Nose: Nose normal.  Mouth/Throat: Oropharynx is clear and moist.  Eyes: Conjunctivae and EOM are normal. Pupils are equal, round, and reactive to light.  Neck: Normal range of motion. Neck supple. No thyromegaly present.  Cardiovascular: Normal rate, regular rhythm, normal heart sounds and intact distal pulses.   Pulmonary/Chest: Effort normal and breath sounds normal.  Abdominal: Bowel sounds are normal.  Genitourinary:       Declined pap smear  Musculoskeletal: Normal range of motion.  Neurological: She is alert and oriented to person, place, and time. She has normal reflexes.  Skin: Skin is warm and dry.  Psychiatric: She has a normal mood and affect.          Assessment & Plan:  Assessment: Complete physical exam, hypothyroidism, hyperlipidemia,  obesity  Plan: Lab sent to include BMP, lipids. Other labs were drawn about 6 weeks ago. Refer for colonoscopy screening. Tetanus administered today. Encouraged healthy diet, exercise, weight reduction. Patient to follow with her PCP in 4-6 months and sooner when necessary.

## 2012-03-05 ENCOUNTER — Encounter: Payer: Self-pay | Admitting: Internal Medicine

## 2012-03-09 ENCOUNTER — Ambulatory Visit (AMBULATORY_SURGERY_CENTER): Payer: Self-pay

## 2012-03-09 ENCOUNTER — Other Ambulatory Visit: Payer: Self-pay | Admitting: Internal Medicine

## 2012-03-09 VITALS — Ht 63.0 in | Wt 189.2 lb

## 2012-03-09 DIAGNOSIS — Z1211 Encounter for screening for malignant neoplasm of colon: Secondary | ICD-10-CM

## 2012-03-09 MED ORDER — MOVIPREP 100 G PO SOLR: ORAL | Status: DC

## 2012-03-09 NOTE — Progress Notes (Signed)
Pt came into office today for her pre-visit prior to her colonoscopy on 08/02 with Dr Marina Goodell. Pt states she had a colon done 12 years ago with Dr Darryll Capers.Pt did sign a medical release form which will be given to [facility].

## 2012-03-10 ENCOUNTER — Encounter: Payer: Self-pay | Admitting: Internal Medicine

## 2012-03-15 ENCOUNTER — Ambulatory Visit (INDEPENDENT_AMBULATORY_CARE_PROVIDER_SITE_OTHER): Payer: Self-pay | Admitting: Internal Medicine

## 2012-03-15 ENCOUNTER — Encounter: Payer: Self-pay | Admitting: Internal Medicine

## 2012-03-15 VITALS — BP 120/78 | HR 72 | Temp 98.6°F | Resp 16 | Ht 63.0 in | Wt 190.0 lb

## 2012-03-15 DIAGNOSIS — E785 Hyperlipidemia, unspecified: Secondary | ICD-10-CM

## 2012-03-15 DIAGNOSIS — I10 Essential (primary) hypertension: Secondary | ICD-10-CM

## 2012-03-15 DIAGNOSIS — F909 Attention-deficit hyperactivity disorder, unspecified type: Secondary | ICD-10-CM

## 2012-03-15 DIAGNOSIS — F411 Generalized anxiety disorder: Secondary | ICD-10-CM

## 2012-03-15 MED ORDER — AMPHETAMINE-DEXTROAMPHETAMINE 20 MG PO TABS: 20.0000 mg | ORAL_TABLET | Freq: Two times a day (BID) | ORAL | Status: DC

## 2012-03-15 MED ORDER — CITALOPRAM HYDROBROMIDE 40 MG PO TABS: 40.0000 mg | ORAL_TABLET | Freq: Every day | ORAL | Status: DC

## 2012-03-15 NOTE — Patient Instructions (Signed)
The patient is instructed to continue all medications as prescribed. Schedule followup with check out clerk upon leaving the clinic  

## 2012-03-15 NOTE — Progress Notes (Signed)
Subjective:    Patient ID: Paige [name], female    DOB: May 04, 1951, 61 y.o.   MRN: [id number]  HPI  Pressure is well controlled her current medications we discussed the results of her lipid profile which shows a reduction in total cholesterol stable high HDL and moderate LDL cholesterol.  Triglycerides look good and the patient has been trying to modify sweets in her diet.  Her fasting blood glucoses significantly reduced  She still has a great deal of lethargy  Review of Systems  Constitutional: Negative for activity change, appetite change and fatigue.  HENT: Negative for ear pain, congestion, neck pain, postnasal drip and sinus pressure.   Eyes: Negative for redness and visual disturbance.  Respiratory: Negative for cough, shortness of breath and wheezing.   Gastrointestinal: Negative for abdominal pain and abdominal distention.  Genitourinary: Negative for dysuria, frequency and menstrual problem.  Musculoskeletal: Negative for myalgias, joint swelling and arthralgias.  Skin: Negative for rash and wound.  Neurological: Negative for dizziness, weakness and headaches.  Hematological: Negative for adenopathy. Does not bruise/bleed easily.  Psychiatric/Behavioral: Negative for disturbed wake/sleep cycle and decreased concentration.   Past Medical History  Diagnosis Date  . Hypertension   . Thyroid disease   . GERD (gastroesophageal reflux disease)   . Arthritis   . Carpal tunnel syndrome   . Anxiety   . ADD (attention deficit disorder)     Pt has hard time focusing    History   Social History  . Marital Status: Married    Spouse Name: N/A    Number of Children: N/A  . Years of Education: N/A   Occupational History  . retired    Social History Main Topics  . Smoking status: Never Smoker   . Smokeless tobacco: Not on file  . Alcohol Use: No  . Drug Use: No  . Sexually Active: No   Other Topics Concern  . Not on file   Social History Narrative  . No  narrative on file    Past Surgical History  Procedure Date  . Cholecystectomy   . Abdominal hysterectomy   . Tubal ligation   . Fx leg   . Nasal sinus surgery   . Closed reduction hand fracture     right hand    Family History  Problem Relation Age of Onset  . Lung cancer    . Hyperlipidemia Mother   . Cancer Father     Allergies  Allergen Reactions  . Asa (Aspirin) Other (See Comments)    SOB and blocks ears and nose    Current Outpatient Prescriptions on File Prior to Visit  Medication Sig Dispense Refill  . amphetamine-dextroamphetamine (ADDERALL, 20MG ,) 20 MG tablet Take 1 tablet (20 mg total) by mouth 2 (two) times daily.  60 tablet  0  . bisoprolol-hydrochlorothiazide (ZIAC) 5-6.25 MG per tablet TAKE ONE TABLET BY MOUTH EVERY DAY  30 tablet  6  . celecoxib (CELEBREX) 200 MG capsule Take 200 mg by mouth daily as needed.        . Cholecalciferol (VITAMIN D HIGH POTENCY) 1000 UNITS capsule Take 1,000 Units by mouth daily.        . citalopram (CELEXA) 20 MG tablet Take 1 tablet (20 mg total) by mouth daily.  30 tablet  6  . diazepam (VALIUM) 10 MG tablet Take 0.5 tablets (5 mg total) by mouth every 12 (twelve) hours as needed.  30 tablet  0  . docusate sodium (COLACE) 100 MG capsule Take  100 mg by mouth daily. Equate stool softener-Take one daily      . lansoprazole (PREVACID) 30 MG capsule Take 30 mg by mouth daily.        Marland Kitchen levothyroxine (SYNTHROID, LEVOTHROID) 137 MCG tablet TAKE ONE TABLET BY MOUTH EVERY DAY  30 tablet  11  . MOVIPREP 100 G SOLR Moviprep as directed / no substitutions  1 kit  0    BP 120/78  Pulse 72  Temp 98.6 F (37 C)  Resp 16  Ht 5\' 3"  (1.6 m)  Wt 190 lb (86.183 kg)  BMI 33.66 kg/m2       Objective:   Physical Exam  Nursing note and vitals reviewed. Constitutional: She is oriented to person, place, and time. She appears well-developed and well-nourished. No distress.  HENT:  Head: Normocephalic and atraumatic.  Right Ear: External  ear normal.  Left Ear: External ear normal.  Nose: Nose normal.  Mouth/Throat: Oropharynx is clear and moist.  Eyes: Conjunctivae and EOM are normal. Pupils are equal, round, and reactive to light.  Neck: Normal range of motion. Neck supple. No JVD present. No tracheal deviation present. No thyromegaly present.  Cardiovascular: Normal rate, regular rhythm, normal heart sounds and intact distal pulses.   No murmur heard. Pulmonary/Chest: Effort normal and breath sounds normal. She has no wheezes. She exhibits no tenderness.  Abdominal: Soft. Bowel sounds are normal.  Musculoskeletal: Normal range of motion. She exhibits no edema and no tenderness.  Lymphadenopathy:    She has no cervical adenopathy.  Neurological: She is alert and oriented to person, place, and time. She has normal reflexes. No cranial nerve deficit.  Skin: Skin is warm and dry. She is not diaphoretic.  Psychiatric: She has a normal mood and affect. Her behavior is normal.          Assessment & Plan:  Persistent lethargy was probably due to incomplete treatment of depression. Will increase the citalopram to 40 and continue the Adderall 20  Help patient with avoiding sweets by emphasizing protein Stable blood pressure

## 2012-03-19 ENCOUNTER — Encounter: Payer: Self-pay | Admitting: Internal Medicine

## 2012-03-19 ENCOUNTER — Ambulatory Visit (AMBULATORY_SURGERY_CENTER): Payer: Self-pay | Admitting: Internal Medicine

## 2012-03-19 VITALS — BP 104/51 | HR 74 | Temp 96.5°F | Resp 18 | Ht 63.0 in | Wt 189.0 lb

## 2012-03-19 DIAGNOSIS — D126 Benign neoplasm of colon, unspecified: Secondary | ICD-10-CM

## 2012-03-19 DIAGNOSIS — K635 Polyp of colon: Secondary | ICD-10-CM

## 2012-03-19 DIAGNOSIS — Z1211 Encounter for screening for malignant neoplasm of colon: Secondary | ICD-10-CM

## 2012-03-19 MED ORDER — SODIUM CHLORIDE 0.9 % IV SOLN: 500.0000 mL | INTRAVENOUS | Status: DC

## 2012-03-19 NOTE — Patient Instructions (Addendum)
Findings:  Polyp, Melanosis Coli Recommendations:  Repeat colonoscopy in 3 years if polyp adenomatous.  YOU HAD AN ENDOSCOPIC PROCEDURE TODAY AT THE  ENDOSCOPY CENTER: Refer to the procedure report that was given to you for any specific questions about what was found during the examination.  If the procedure report does not answer your questions, please call your gastroenterologist to clarify.  If you requested that your care partner not be given the details of your procedure findings, then the procedure report has been included in a sealed envelope for you to review at your convenience later.  YOU SHOULD EXPECT: Some feelings of bloating in the abdomen. Passage of more gas than usual.  Walking can help get rid of the air that was put into your GI tract during the procedure and reduce the bloating. If you had a lower endoscopy (such as a colonoscopy or flexible sigmoidoscopy) you may notice spotting of blood in your stool or on the toilet paper. If you underwent a bowel prep for your procedure, then you may not have a normal bowel movement for a few days.  DIET: Your first meal following the procedure should be a light meal and then it is ok to progress to your normal diet.  A half-sandwich or bowl of soup is an example of a good first meal.  Heavy or fried foods are harder to digest and may make you feel nauseous or bloated.  Likewise meals heavy in dairy and vegetables can cause extra gas to form and this can also increase the bloating.  Drink plenty of fluids but you should avoid alcoholic beverages for 24 hours.  ACTIVITY: Your care partner should take you home directly after the procedure.  You should plan to take it easy, moving slowly for the rest of the day.  You can resume normal activity the day after the procedure however you should NOT DRIVE or use heavy machinery for 24 hours (because of the sedation medicines used during the test).    SYMPTOMS TO REPORT IMMEDIATELY: A  gastroenterologist can be reached at any hour.  During normal business hours, 8:30 AM to 5:00 PM Monday through Friday, call [phone].  After hours and on weekends, please call the GI answering service at [phone] who will take a message and have the physician on call contact you.   Following lower endoscopy (colonoscopy or flexible sigmoidoscopy):  Excessive amounts of blood in the stool  Significant tenderness or worsening of abdominal pains  Swelling of the abdomen that is new, acute  Fever of 100F or higher  Following upper endoscopy (EGD)  Vomiting of blood or coffee ground material  New chest pain or pain under the shoulder blades  Painful or persistently difficult swallowing  New shortness of breath  Fever of 100F or higher  Black, tarry-looking stools  FOLLOW UP: If any biopsies were taken you will be contacted by phone or by letter within the next 1-3 weeks.  Call your gastroenterologist if you have not heard about the biopsies in 3 weeks.  Our staff will call the home number listed on your records the next business day following your procedure to check on you and address any questions or concerns that you may have at that time regarding the information given to you following your procedure. This is a courtesy call and so if there is no answer at the home number and we have not heard from you through the emergency physician on call, we will assume that  you have returned to your regular daily activities without incident.  SIGNATURES/CONFIDENTIALITY: You and/or your care partner have signed paperwork which will be entered into your electronic medical record.  These signatures attest to the fact that that the information above on your After Visit Summary has been reviewed and is understood.  Full responsibility of the confidentiality of this discharge information lies with you and/or your care-partner.   Please follow all discharge instructions given to you by the recovery  room nurse. If you have any questions or problems after discharge please call one of the numbers listed above. You will receive a phone call in the am to see how you are doing and answer any questions you may have. Thank you for choosing [facility] for your health care needs.

## 2012-03-19 NOTE — Progress Notes (Signed)
Patient did not experience any of the following events: a burn prior to discharge; a fall within the facility; wrong site/side/patient/procedure/implant event; or a hospital transfer or hospital admission upon discharge from the facility. (G8907) Patient did not have preoperative order for IV antibiotic SSI prophylaxis. (G8918)  

## 2012-03-19 NOTE — Op Note (Signed)
[facility] [address] [place], [place]  [address]  COLONOSCOPY PROCEDURE REPORT  PATIENT:  Paige, [name]  MR#:  [id number] BIRTHDATE:  September 09, 1950, 61 yrs. old  GENDER:  female ENDOSCOPIST:  Wilhemina Bonito. Eda Keys, MD REF. BY:  Stacie Glaze, M.D. PROCEDURE DATE:  03/19/2012 PROCEDURE:  Colonoscopy with snare polypectomy x 1 ASA CLASS:  Class II INDICATIONS:  Routine Risk Screening MEDICATIONS:   MAC sedation, administered by CRNA, propofol (Diprivan) 400 mg IV  DESCRIPTION OF PROCEDURE:   After the risks benefits and alternatives of the procedure were thoroughly explained, informed consent was obtained.  Digital rectal exam was performed and revealed no abnormalities.   The LB CF-[id number] endoscope was introduced through the anus and advanced to the cecum, which was identified by both the appendix and ileocecal valve, without limitations.  The quality of the prep was excellent, using MoviPrep.  The instrument was then slowly withdrawn as the colon was fully examined. <<PROCEDUREIMAGES>>  FINDINGS:  A 15mm pedunculated polyp was found in the sigmoid colon and snared, then cauterized with monopolar cautery. Retrieval was successful.  Melanosis coli was present.  Otherwise normal colonoscopy without other polyps, masses, vascular ectasias, or inflammatory changes.   Retroflexed views in the rectum revealed no abnormalities.    The time to cecum =  2:32 minutes. The scope was then withdrawn in   14:55  minutes from the cecum and the procedure completed.  COMPLICATIONS:  None  ENDOSCOPIC IMPRESSION: 1) 15mm Pedunculated polyp in the sigmoid colon - removed 2) Melanosis coli 3) Otherwise normal colonoscopy  RECOMMENDATIONS: 1) Repeat Colonoscopy in 3 years if polyp adenomatous.  ______________________________ Wilhemina Bonito. Eda Keys, MD  CC:  Stacie Glaze, MD;  The Patient  n. eSIGNED:   Wilhemina Bonito. Eda Keys at 03/19/2012 08:51 AM  Ross Ludwig,  [phone]

## 2012-03-22 ENCOUNTER — Telehealth: Payer: Self-pay

## 2012-03-22 NOTE — Telephone Encounter (Signed)
Left message

## 2012-03-30 ENCOUNTER — Encounter: Payer: Self-pay | Admitting: Internal Medicine

## 2012-04-01 ENCOUNTER — Ambulatory Visit: Payer: Self-pay | Admitting: Internal Medicine

## 2012-04-26 ENCOUNTER — Ambulatory Visit: Payer: Self-pay | Admitting: Internal Medicine

## 2012-04-26 ENCOUNTER — Encounter: Payer: Self-pay | Admitting: Family

## 2012-04-26 ENCOUNTER — Ambulatory Visit (INDEPENDENT_AMBULATORY_CARE_PROVIDER_SITE_OTHER): Payer: Self-pay | Admitting: Family

## 2012-04-26 VITALS — BP 120/80 | HR 98 | Wt 191.0 lb

## 2012-04-26 DIAGNOSIS — L0390 Cellulitis, unspecified: Secondary | ICD-10-CM

## 2012-04-26 DIAGNOSIS — Z9889 Other specified postprocedural states: Secondary | ICD-10-CM

## 2012-04-26 MED ORDER — MOXIFLOXACIN HCL 400 MG PO TABS: 400.0000 mg | ORAL_TABLET | Freq: Every day | ORAL | Status: AC

## 2012-04-26 NOTE — Progress Notes (Signed)
Subjective:    Patient ID: Paige [name], female    DOB: 10-23-1950, 61 y.o.   MRN: [id number]  HPI 61 year old white female, patient of [name] is in with complaints of drainage and irritation to her left lower leg as a result of a skin biopsy that was performed 03/15/2012. Patient reports pustular drainage from her left leg she's been tried to nursing home with triple antibiotic ointment. She continues to have redness at the site of pustular drainage. Minimal pain. Skin biopsy results were normal.   Review of Systems  Constitutional: Negative.   Respiratory: Negative.   Cardiovascular: Negative.   Musculoskeletal: Negative.   Skin:       Biopsy site is red and tender to touch. Pus from the lesion.   Hematological: Negative.   Psychiatric/Behavioral: Negative.    Past Medical History  Diagnosis Date  . Hypertension   . Thyroid disease   . GERD (gastroesophageal reflux disease)   . Arthritis   . Carpal tunnel syndrome   . Anxiety   . ADD (attention deficit disorder)     Pt has hard time focusing    History   Social History  . Marital Status: Married    Spouse Name: N/A    Number of Children: N/A  . Years of Education: N/A   Occupational History  . retired    Social History Main Topics  . Smoking status: Never Smoker   . Smokeless tobacco: Never Used  . Alcohol Use: No  . Drug Use: No  . Sexually Active: No   Other Topics Concern  . Not on file   Social History Narrative  . No narrative on file    Past Surgical History  Procedure Date  . Cholecystectomy   . Abdominal hysterectomy   . Tubal ligation   . Fx leg   . Nasal sinus surgery   . Closed reduction hand fracture     right hand    Family History  Problem Relation Age of Onset  . Lung cancer    . Hyperlipidemia Mother   . Cancer Father     Allergies  Allergen Reactions  . Asa (Aspirin) Other (See Comments)    SOB and blocks ears and nose    Current Outpatient Prescriptions on  File Prior to Visit  Medication Sig Dispense Refill  . amphetamine-dextroamphetamine (ADDERALL) 20 MG tablet Take 1 tablet (20 mg total) by mouth 2 (two) times daily.  60 tablet  0  . bisoprolol-hydrochlorothiazide (ZIAC) 5-6.25 MG per tablet TAKE ONE TABLET BY MOUTH EVERY DAY  30 tablet  6  . celecoxib (CELEBREX) 200 MG capsule Take 200 mg by mouth daily as needed.        . Cholecalciferol (VITAMIN D HIGH POTENCY) 1000 UNITS capsule Take 1,000 Units by mouth daily.        . citalopram (CELEXA) 40 MG tablet Take 1 tablet (40 mg total) by mouth daily.  30 tablet  6  . diazepam (VALIUM) 10 MG tablet Take 0.5 tablets (5 mg total) by mouth every 12 (twelve) hours as needed.  30 tablet  0  . docusate sodium (COLACE) 100 MG capsule Take 100 mg by mouth daily. Equate stool softener-Take one daily      . lansoprazole (PREVACID) 30 MG capsule Take 30 mg by mouth daily.        Marland Kitchen levothyroxine (SYNTHROID, LEVOTHROID) 137 MCG tablet TAKE ONE TABLET BY MOUTH EVERY DAY  30 tablet  11  BP 120/80  Pulse 98  Wt 191 lb (86.637 kg)  SpO2 96%chart    Objective:   Physical Exam  Constitutional: She is oriented to person, place, and time. She appears well-developed and well-nourished.  Neck: Normal range of motion. Neck supple.  Cardiovascular: Normal rate, regular rhythm and normal heart sounds.   Pulmonary/Chest: Effort normal and breath sounds normal.  Musculoskeletal: Normal range of motion.  Neurological: She is alert and oriented to person, place, and time.  Skin: Skin is warm and dry.       Biopsy site: red surrounding the site. Mild pustular discharge from the lesion. Mildly warm to touch.   Psychiatric: She has a normal mood and affect.          Assessment & Plan:  Assessment: Cellulitis, Post biopsy complication  Plan: Because patient does not have any means to get her prescription filled, at 3 samples of Avelox 400 mg once daily x3 days. Advised patient to call the office if her symptoms  worsen or persist. Recheck a schedule, and when necessary.

## 2012-04-30 ENCOUNTER — Ambulatory Visit: Payer: Self-pay | Admitting: Internal Medicine

## 2012-05-12 ENCOUNTER — Encounter: Payer: Self-pay | Admitting: Internal Medicine

## 2012-05-25 ENCOUNTER — Other Ambulatory Visit: Payer: Self-pay | Admitting: Internal Medicine

## 2012-05-27 ENCOUNTER — Telehealth: Payer: Self-pay | Admitting: Internal Medicine

## 2012-05-27 NOTE — Telephone Encounter (Signed)
Pt needs new rx generic adderall 20 mg °

## 2012-05-28 ENCOUNTER — Telehealth: Payer: Self-pay | Admitting: *Deleted

## 2012-05-28 DIAGNOSIS — F909 Attention-deficit hyperactivity disorder, unspecified type: Secondary | ICD-10-CM

## 2012-05-28 MED ORDER — AMPHETAMINE-DEXTROAMPHETAMINE 20 MG PO TABS: 20.0000 mg | ORAL_TABLET | Freq: Two times a day (BID) | ORAL | Status: DC

## 2012-05-28 NOTE — Telephone Encounter (Signed)
done

## 2012-05-28 NOTE — Telephone Encounter (Signed)
Printed and will call pt and let her k now that they are ready for pick upafter dr Lovell Sheehan signs

## 2012-06-14 ENCOUNTER — Ambulatory Visit (INDEPENDENT_AMBULATORY_CARE_PROVIDER_SITE_OTHER): Payer: Self-pay | Admitting: Internal Medicine

## 2012-06-14 ENCOUNTER — Encounter: Payer: Self-pay | Admitting: Internal Medicine

## 2012-06-14 VITALS — BP 140/80 | HR 76 | Temp 98.2°F | Resp 16 | Ht 63.0 in | Wt 189.0 lb

## 2012-06-14 DIAGNOSIS — I10 Essential (primary) hypertension: Secondary | ICD-10-CM

## 2012-06-14 NOTE — Progress Notes (Signed)
  Subjective:    Patient ID: Paige [name], female    DOB: Aug 18, 1951, 61 y.o.   MRN: [id number]  HPI  Bloated with increased gluten  Review of Systems  Constitutional: Negative for activity change, appetite change and fatigue.  HENT: Negative for ear pain, congestion, neck pain, postnasal drip and sinus pressure.   Eyes: Negative for redness and visual disturbance.  Respiratory: Negative for cough, shortness of breath and wheezing.   Gastrointestinal: Negative for abdominal pain and abdominal distention.  Genitourinary: Negative for dysuria, frequency and menstrual problem.  Musculoskeletal: Negative for myalgias, joint swelling and arthralgias.  Skin: Negative for rash and wound.  Neurological: Negative for dizziness, weakness and headaches.  Hematological: Negative for adenopathy. Does not bruise/bleed easily.  Psychiatric/Behavioral: Negative for disturbed wake/sleep cycle and decreased concentration.       Objective:   Physical Exam  Nursing note and vitals reviewed. Constitutional: She is oriented to person, place, and time. She appears well-developed and well-nourished. No distress.  HENT:  Head: Normocephalic and atraumatic.  Right Ear: External ear normal.  Left Ear: External ear normal.  Nose: Nose normal.  Mouth/Throat: Oropharynx is clear and moist.  Eyes: Conjunctivae normal and EOM are normal. Pupils are equal, round, and reactive to light.  Neck: Normal range of motion. Neck supple. No JVD present. No tracheal deviation present. No thyromegaly present.  Cardiovascular: Normal rate, regular rhythm, normal heart sounds and intact distal pulses.   No murmur heard. Pulmonary/Chest: Effort normal and breath sounds normal. She has no wheezes. She exhibits no tenderness.  Abdominal: Soft. Bowel sounds are normal.  Musculoskeletal: Normal range of motion. She exhibits no edema and no tenderness.  Lymphadenopathy:    She has no cervical adenopathy.  Neurological:  She is alert and oriented to person, place, and time. She has normal reflexes. No cranial nerve deficit.  Skin: Skin is warm and dry. She is not diaphoretic.  Psychiatric: She has a normal mood and affect. Her behavior is normal.          Assessment & Plan:  Stable thyroid Stable HTN

## 2012-07-19 ENCOUNTER — Other Ambulatory Visit: Payer: Self-pay | Admitting: Occupational Medicine

## 2012-07-19 ENCOUNTER — Ambulatory Visit: Payer: Self-pay

## 2012-07-19 DIAGNOSIS — M25569 Pain in unspecified knee: Secondary | ICD-10-CM

## 2012-10-09 ENCOUNTER — Other Ambulatory Visit: Payer: Self-pay | Admitting: Internal Medicine

## 2012-10-11 ENCOUNTER — Ambulatory Visit: Payer: Self-pay | Admitting: Internal Medicine

## 2012-10-19 ENCOUNTER — Ambulatory Visit (INDEPENDENT_AMBULATORY_CARE_PROVIDER_SITE_OTHER): Payer: BC Managed Care – PPO | Admitting: Internal Medicine

## 2012-10-19 ENCOUNTER — Encounter: Payer: Self-pay | Admitting: Internal Medicine

## 2012-10-19 VITALS — BP 120/82 | HR 72 | Temp 98.6°F | Resp 16 | Ht 63.0 in | Wt 196.0 lb

## 2012-10-19 DIAGNOSIS — F909 Attention-deficit hyperactivity disorder, unspecified type: Secondary | ICD-10-CM

## 2012-10-19 DIAGNOSIS — L570 Actinic keratosis: Secondary | ICD-10-CM | POA: Insufficient documentation

## 2012-10-19 DIAGNOSIS — R21 Rash and other nonspecific skin eruption: Secondary | ICD-10-CM

## 2012-10-19 DIAGNOSIS — R7309 Other abnormal glucose: Secondary | ICD-10-CM

## 2012-10-19 DIAGNOSIS — R739 Hyperglycemia, unspecified: Secondary | ICD-10-CM

## 2012-10-19 DIAGNOSIS — E039 Hypothyroidism, unspecified: Secondary | ICD-10-CM

## 2012-10-19 DIAGNOSIS — I10 Essential (primary) hypertension: Secondary | ICD-10-CM

## 2012-10-19 HISTORY — DX: Actinic keratosis: L57.0

## 2012-10-19 MED ORDER — AMPHETAMINE-DEXTROAMPHETAMINE 20 MG PO TABS: 20.0000 mg | ORAL_TABLET | Freq: Two times a day (BID) | ORAL | Status: DC

## 2012-10-19 MED ORDER — LISDEXAMFETAMINE DIMESYLATE 70 MG PO CAPS: 70.0000 mg | ORAL_CAPSULE | ORAL | Status: DC

## 2012-10-19 MED ORDER — KETOCONAZOLE 2 % EX SHAM: MEDICATED_SHAMPOO | CUTANEOUS | Status: DC

## 2012-10-19 MED ORDER — LISDEXAMFETAMINE DIMESYLATE 60 MG PO CAPS: 60.0000 mg | ORAL_CAPSULE | ORAL | Status: DC

## 2012-10-19 NOTE — Patient Instructions (Signed)
The patient is instructed to continue all medications as prescribed. Schedule followup with check out clerk upon leaving the clinic  

## 2012-10-19 NOTE — Progress Notes (Signed)
  Subjective:    Patient ID: Paige [name], female    DOB: 02/07/1951, 62 y.o.   MRN: [id number]  HPI Iching rash on back and shoulder. Non healing ulcer on left anterior shoulder Increased pain in shoulder joints/ busitis    Review of Systems  Constitutional: Negative for activity change, appetite change and fatigue.  HENT: Negative for ear pain, congestion, neck pain, postnasal drip and sinus pressure.   Eyes: Negative for redness and visual disturbance.  Respiratory: Negative for cough, shortness of breath and wheezing.   Gastrointestinal: Negative for abdominal pain and abdominal distention.  Genitourinary: Negative for dysuria, frequency and menstrual problem.  Musculoskeletal: Positive for myalgias, back pain, joint swelling and arthralgias.  Skin: Positive for color change and rash. Negative for wound.       iching  Neurological: Negative for dizziness, weakness and headaches.  Hematological: Negative for adenopathy. Does not bruise/bleed easily.  Psychiatric/Behavioral: Negative for sleep disturbance and decreased concentration.       Objective:   Physical Exam  Nursing note and vitals reviewed. Constitutional: She is oriented to person, place, and time. She appears well-developed and well-nourished. No distress.  HENT:  Head: Normocephalic and atraumatic.  Right Ear: External ear normal.  Left Ear: External ear normal.  Nose: Nose normal.  Mouth/Throat: Oropharynx is clear and moist.  Eyes: Conjunctivae and EOM are normal. Pupils are equal, round, and reactive to light.  Neck: Normal range of motion. Neck supple. No JVD present. No tracheal deviation present. No thyromegaly present.  Cardiovascular: Normal rate, regular rhythm, normal heart sounds and intact distal pulses.   No murmur heard. Pulmonary/Chest: Effort normal and breath sounds normal. She has no wheezes. She exhibits no tenderness.  Abdominal: Soft. Bowel sounds are normal.  Musculoskeletal: Normal  range of motion. She exhibits no edema and no tenderness.  Lymphadenopathy:    She has no cervical adenopathy.  Neurological: She is alert and oriented to person, place, and time. She has normal reflexes. No cranial nerve deficit.  Skin: Rash noted. She is not diaphoretic.  AK on left shoulder multiple "ring worm" lesions on back  Psychiatric:  distractable          Assessment & Plan:  1. AK on shoulder  Informed consent was obtained in the lesion was treated for 60 seconds of liquid nitrogen application the patient tolerated the procedure well as procedural care was discussed with the patient and instructions should the lesion reappears contact our office immediately 2. Memory and concentration issues on adderal with regression 3. Shoulder pain bilaterally 4. Cheilosis of lips

## 2012-10-20 LAB — HEMOGLOBIN A1C: Hgb A1c MFr Bld: 6 % (ref 4.6–6.5)

## 2012-10-25 ENCOUNTER — Other Ambulatory Visit: Payer: Self-pay | Admitting: Internal Medicine

## 2012-10-27 ENCOUNTER — Telehealth: Payer: Self-pay | Admitting: Internal Medicine

## 2012-10-28 ENCOUNTER — Telehealth: Payer: Self-pay | Admitting: Internal Medicine

## 2012-10-28 NOTE — Telephone Encounter (Signed)
[address] [place], [place] [address] p. [phone] f. [phone] To: [facility] (After Hours Triage) Fax: [phone] From: [facility] Date/ Time: 10/27/2012 5:24 PM Taken By: Jethro BolusRenea Ee Facility: home Patient: Paige [name], Paige [name] DOB: November 03, 1950 Phone: [phone] Reason for Call: Calling about her lab work from last week. Has been without power and hasn't heard anything. She would like a call on Thursday after 4p please at [phone]. The ANA and A1C were within normal range. Regarding Appointment:

## 2012-10-28 NOTE — Telephone Encounter (Signed)
All wnl except tsh- will discuss with dr Lovell Sheehan tomorrow and call back if med change

## 2012-11-11 ENCOUNTER — Other Ambulatory Visit: Payer: Self-pay | Admitting: Internal Medicine

## 2012-11-25 ENCOUNTER — Other Ambulatory Visit: Payer: Self-pay | Admitting: Internal Medicine

## 2012-11-30 ENCOUNTER — Telehealth: Payer: Self-pay | Admitting: Internal Medicine

## 2012-11-30 NOTE — Telephone Encounter (Addendum)
Pt needs prior authorization for vyvanse.  It may take up to 2 weeks to get vyvanse approved. Pt will need a new script for amphetamine-dextroamphetamine (ADDERALL) 20 MG 1 tab 2x/day.  Pt would like to pick up  ADDERALL rx tomorrow since she is out of meds.Marland Kitchen

## 2012-12-01 ENCOUNTER — Other Ambulatory Visit: Payer: Self-pay | Admitting: *Deleted

## 2012-12-01 MED ORDER — AMPHETAMINE-DEXTROAMPHETAMINE 20 MG PO TABS: 20.0000 mg | ORAL_TABLET | Freq: Two times a day (BID) | ORAL | Status: DC

## 2012-12-01 NOTE — Telephone Encounter (Signed)
Left message on machine Script is ready for pick up

## 2012-12-12 ENCOUNTER — Other Ambulatory Visit: Payer: Self-pay | Admitting: Internal Medicine

## 2012-12-24 ENCOUNTER — Other Ambulatory Visit: Payer: Self-pay | Admitting: Internal Medicine

## 2012-12-29 ENCOUNTER — Telehealth: Payer: Self-pay | Admitting: Internal Medicine

## 2012-12-29 NOTE — Telephone Encounter (Signed)
Prior auth for [facility] denied. Pt has tried ADDERALL in past. Per insurance, this is not enough. Pt must also try another alternative, such as: ADDERALL XR, CONCERTA, etc. Please advise.

## 2012-12-30 ENCOUNTER — Other Ambulatory Visit: Payer: Self-pay | Admitting: *Deleted

## 2012-12-30 MED ORDER — AMPHETAMINE-DEXTROAMPHET ER 30 MG PO CP24: 30.0000 mg | ORAL_CAPSULE | ORAL | Status: DC

## 2012-12-30 NOTE — Telephone Encounter (Signed)
Left message on machine For pt 

## 2012-12-30 NOTE — Telephone Encounter (Signed)
Per dr Lovell Sheehan may have adderall xr 30 generic.l Left message on machine

## 2013-01-09 ENCOUNTER — Other Ambulatory Visit: Payer: Self-pay | Admitting: Internal Medicine

## 2013-01-21 ENCOUNTER — Other Ambulatory Visit: Payer: Self-pay | Admitting: *Deleted

## 2013-01-21 MED ORDER — CITALOPRAM HYDROBROMIDE 40 MG PO TABS: ORAL_TABLET | ORAL | Status: DC

## 2013-02-06 ENCOUNTER — Other Ambulatory Visit: Payer: Self-pay | Admitting: Internal Medicine

## 2013-02-07 ENCOUNTER — Other Ambulatory Visit: Payer: Self-pay | Admitting: *Deleted

## 2013-02-07 MED ORDER — LEVOTHYROXINE SODIUM 137 MCG PO TABS: ORAL_TABLET | ORAL | Status: DC

## 2013-02-08 ENCOUNTER — Other Ambulatory Visit: Payer: Self-pay | Admitting: *Deleted

## 2013-02-08 MED ORDER — BISOPROLOL-HYDROCHLOROTHIAZIDE 5-6.25 MG PO TABS: ORAL_TABLET | ORAL | Status: DC

## 2013-02-21 ENCOUNTER — Encounter: Payer: Self-pay | Admitting: Internal Medicine

## 2013-02-21 ENCOUNTER — Ambulatory Visit (INDEPENDENT_AMBULATORY_CARE_PROVIDER_SITE_OTHER): Payer: BC Managed Care – PPO | Admitting: Internal Medicine

## 2013-02-21 VITALS — BP 136/80 | HR 72 | Temp 98.2°F | Resp 16 | Ht 63.0 in | Wt 200.0 lb

## 2013-02-21 DIAGNOSIS — R5381 Other malaise: Secondary | ICD-10-CM

## 2013-02-21 DIAGNOSIS — F4323 Adjustment disorder with mixed anxiety and depressed mood: Secondary | ICD-10-CM

## 2013-02-21 DIAGNOSIS — F988 Other specified behavioral and emotional disorders with onset usually occurring in childhood and adolescence: Secondary | ICD-10-CM

## 2013-02-21 MED ORDER — AMPHETAMINE-DEXTROAMPHET ER 30 MG PO CP24: 30.0000 mg | ORAL_CAPSULE | ORAL | Status: DC

## 2013-02-21 MED ORDER — FLUOXETINE HCL 40 MG PO CAPS: 40.0000 mg | ORAL_CAPSULE | Freq: Every day | ORAL | Status: DC

## 2013-02-21 NOTE — Progress Notes (Signed)
Subjective:    Patient ID: Paige [name], female    DOB: 07/11/1951, 62 y.o.   MRN: [id number]  HPI "tired all the time" Not exercising! 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  HENT: Negative for ear pain, congestion, neck pain, postnasal drip and sinus pressure.   Eyes: Negative for redness and visual disturbance.  Respiratory: Negative for cough, shortness of breath and wheezing.   Gastrointestinal: Negative for abdominal pain and abdominal distention.  Genitourinary: Negative for dysuria, frequency and menstrual problem.  Musculoskeletal: Negative for myalgias, joint swelling and arthralgias.  Skin: Negative for rash and wound.  Neurological: Negative for dizziness, weakness and headaches.  Hematological: Negative for adenopathy. Does not bruise/bleed easily.  Psychiatric/Behavioral: Negative for sleep disturbance and decreased concentration.   Past Medical History  Diagnosis Date  . Hypertension   . Thyroid disease   . GERD (gastroesophageal reflux disease)   . Arthritis   . Carpal tunnel syndrome   . Anxiety   . ADD (attention deficit disorder)     Pt has hard time focusing    History   Social History  . Marital Status: Married    Spouse Name: N/A    Number of Children: N/A  . Years of Education: N/A   Occupational History  . retired    Social History Main Topics  . Smoking status: Never Smoker   . Smokeless tobacco: Never Used  . Alcohol Use: No  . Drug Use: No  . Sexually Active: No   Other Topics Concern  . Not on file   Social History Narrative  . No narrative on file    Past Surgical History  Procedure Laterality Date  . Cholecystectomy    . Abdominal hysterectomy    . Tubal ligation    . Fx leg    . Nasal sinus surgery    . Closed reduction hand fracture      right hand     Family History  Problem Relation Age of Onset  . Lung cancer    . Hyperlipidemia Mother   . Cancer Father     Allergies  Allergen Reactions  . Asa (Aspirin) Other (See Comments)    SOB and blocks ears and nose    Current Outpatient Prescriptions on File Prior to Visit  Medication Sig Dispense Refill  . bisoprolol-hydrochlorothiazide (ZIAC) 5-6.25 MG per tablet TAKE ONE TABLET BY MOUTH ONCE DAILY  90 tablet  3  . celecoxib (CELEBREX) 200 MG capsule Take 200 mg by mouth daily as needed.        . Cholecalciferol (VITAMIN D HIGH POTENCY) 1000 UNITS capsule Take 1,000 Units by mouth daily.        . diazepam (VALIUM) 10 MG tablet TAKE ONE-HALF TABLET BY MOUTH EVERY 12 HOURS AS NEEDED  30 tablet  0  . docusate sodium (COLACE) 100 MG capsule Take 100 mg by mouth daily. Equate stool softener-Take one daily      . ketoconazole (NIZORAL) 2 % shampoo Apply topically 2 (two) times a week. Both to back and scalp  120 mL  0  . lansoprazole (PREVACID) 30 MG capsule Take 30 mg by mouth daily.        Marland Kitchen levothyroxine (SYNTHROID, LEVOTHROID) 137 MCG tablet TAKE ONE TABLET BY MOUTH EVERY DAY  90 tablet  3   No current  facility-administered medications on file prior to visit.    BP 136/80  Pulse 72  Temp(Src) 98.2 F (36.8 C)  Resp 16  Ht 5\' 3"  (1.6 m)  Wt 200 lb (90.719 kg)  BMI 35.44 kg/m2       Objective:   Physical Exam  Nursing note and vitals reviewed. Constitutional: She is oriented to person, place, and time. She appears well-developed and well-nourished. No distress.  HENT:  Head: Normocephalic and atraumatic.  Eyes: Conjunctivae and EOM are normal. Pupils are equal, round, and reactive to light.  Neck: Normal range of motion. Neck supple. No JVD present. No tracheal deviation present. No thyromegaly present.  Cardiovascular: Normal rate, regular rhythm and intact distal pulses.   Murmur heard. Pulmonary/Chest: Effort normal and breath sounds normal. She has no wheezes. She  exhibits no tenderness.  Abdominal: Soft. Bowel sounds are normal.  Musculoskeletal: Normal range of motion. She exhibits no edema and no tenderness.  Lymphadenopathy:    She has no cervical adenopathy.  Neurological: She is alert and oriented to person, place, and time. She has normal reflexes. No cranial nerve deficit.  Skin: Skin is warm and dry. She is not diaphoretic.  Psychiatric:  Hypoactivity noted Speech normal           Assessment & Plan:  ADD refill medications per protocol Change the antidepressant to possibly increase stimulation to prozac 40 Exercise and weight loss

## 2013-05-04 ENCOUNTER — Telehealth: Payer: Self-pay | Admitting: Internal Medicine

## 2013-05-04 NOTE — Telephone Encounter (Signed)
Pt needs new generic adderall xr 30 mg °

## 2013-05-05 ENCOUNTER — Other Ambulatory Visit: Payer: Self-pay | Admitting: *Deleted

## 2013-05-05 MED ORDER — AMPHETAMINE-DEXTROAMPHET ER 30 MG PO CP24: 30.0000 mg | ORAL_CAPSULE | ORAL | Status: DC

## 2013-05-05 NOTE — Telephone Encounter (Signed)
Pt informed scripts will be ready ion Friday  morning

## 2013-06-27 ENCOUNTER — Ambulatory Visit (INDEPENDENT_AMBULATORY_CARE_PROVIDER_SITE_OTHER): Payer: BC Managed Care – PPO | Admitting: Internal Medicine

## 2013-06-27 ENCOUNTER — Ambulatory Visit: Payer: Self-pay | Admitting: Internal Medicine

## 2013-06-27 ENCOUNTER — Encounter: Payer: Self-pay | Admitting: Internal Medicine

## 2013-06-27 VITALS — BP 130/80 | HR 76 | Temp 99.1°F | Resp 16 | Ht 63.0 in | Wt 200.0 lb

## 2013-06-27 DIAGNOSIS — R1314 Dysphagia, pharyngoesophageal phase: Secondary | ICD-10-CM

## 2013-06-27 DIAGNOSIS — F988 Other specified behavioral and emotional disorders with onset usually occurring in childhood and adolescence: Secondary | ICD-10-CM

## 2013-06-27 DIAGNOSIS — K219 Gastro-esophageal reflux disease without esophagitis: Secondary | ICD-10-CM

## 2013-06-27 MED ORDER — ATOMOXETINE HCL 80 MG PO CAPS: 80.0000 mg | ORAL_CAPSULE | Freq: Every day | ORAL | Status: DC

## 2013-06-27 NOTE — Progress Notes (Signed)
Subjective:    Patient ID: Paige [name], female    DOB: 14-Aug-1951, 62 y.o.   MRN: [id number]  HPI Mild dysphagia and pain with swallowing Hx of EGD > 14 years! Sore throat and GERD history Stopped the prozac and feels like she is stable Still on the adderal    Review of Systems  Constitutional: Negative for activity change, appetite change and fatigue.  HENT: Negative for congestion, ear pain, postnasal drip and sinus pressure.   Eyes: Negative for redness and visual disturbance.  Respiratory: Negative for cough, shortness of breath and wheezing.   Gastrointestinal: Negative for abdominal pain and abdominal distention.  Genitourinary: Negative for dysuria, frequency and menstrual problem.  Musculoskeletal: Negative for arthralgias, joint swelling, myalgias and neck pain.  Skin: Negative for rash and wound.  Neurological: Negative for dizziness, weakness and headaches.  Hematological: Negative for adenopathy. Does not bruise/bleed easily.  Psychiatric/Behavioral: Negative for sleep disturbance and decreased concentration.   Past Medical History  Diagnosis Date  . Hypertension   . Thyroid disease   . GERD (gastroesophageal reflux disease)   . Arthritis   . Carpal tunnel syndrome   . Anxiety   . ADD (attention deficit disorder)     Pt has hard time focusing    History   Social History  . Marital Status: Married    Spouse Name: N/A    Number of Children: N/A  . Years of Education: N/A   Occupational History  . retired    Social History Main Topics  . Smoking status: Never Smoker   . Smokeless tobacco: Never Used  . Alcohol Use: No  . Drug Use: No  . Sexual Activity: No   Other Topics Concern  . Not on file   Social History Narrative  . No narrative on file    Past Surgical History  Procedure Laterality Date  . Cholecystectomy    . Abdominal hysterectomy    . Tubal ligation    . Fx leg    . Nasal sinus surgery    . Closed reduction hand fracture       right hand    Family History  Problem Relation Age of Onset  . Lung cancer    . Hyperlipidemia Mother   . Cancer Father     Allergies  Allergen Reactions  . Asa [Aspirin] Other (See Comments)    SOB and blocks ears and nose    Current Outpatient Prescriptions on File Prior to Visit  Medication Sig Dispense Refill  . amphetamine-dextroamphetamine (ADDERALL XR) 30 MG 24 hr capsule Take 1 capsule (30 mg total) by mouth every morning.  30 capsule  0  . bisoprolol-hydrochlorothiazide (ZIAC) 5-6.25 MG per tablet TAKE ONE TABLET BY MOUTH ONCE DAILY  90 tablet  3  . celecoxib (CELEBREX) 200 MG capsule Take 200 mg by mouth daily as needed.        . Cholecalciferol (VITAMIN D HIGH POTENCY) 1000 UNITS capsule Take 1,000 Units by mouth daily.        . diazepam (VALIUM) 10 MG tablet TAKE ONE-HALF TABLET BY MOUTH EVERY 12 HOURS AS NEEDED  30 tablet  0  . docusate sodium (COLACE) 100 MG capsule Take 100 mg by mouth daily. Equate stool softener-Take one daily      . ketoconazole (NIZORAL) 2 % shampoo Apply topically 2 (two) times a week. Both to back and scalp  120 mL  0  . lansoprazole (PREVACID) 30 MG capsule Take 30 mg by mouth  daily.        . levothyroxine (SYNTHROID, LEVOTHROID) 137 MCG tablet TAKE ONE TABLET BY MOUTH EVERY DAY  90 tablet  3   No current facility-administered medications on file prior to visit.    BP 130/80  Pulse 76  Temp(Src) 99.1 F (37.3 C)  Resp 16  Ht 5\' 3"  (1.6 m)  Wt 200 lb (90.719 kg)  BMI 35.44 kg/m2        Objective:   Physical Exam  Constitutional: She is oriented to person, place, and time. She appears well-developed and well-nourished. No distress.  HENT:  Head: Normocephalic and atraumatic.  Moderate air female. Posterior oropharynx with some cobblestoning slight prominence of the left tonsil no exudate. No adenopathy  Eyes: Conjunctivae and EOM are normal. Pupils are equal, round, and reactive to light.  Neck: Normal range of motion.  Neck supple. No JVD present. No tracheal deviation present. No thyromegaly present.  Cardiovascular: Normal rate and regular rhythm.   No murmur heard. Pulmonary/Chest: Effort normal and breath sounds normal. She has no wheezes. She exhibits no tenderness.  Abdominal: Soft. Bowel sounds are normal.  Musculoskeletal: Normal range of motion. She exhibits no edema and no tenderness.  Lymphadenopathy:    She has no cervical adenopathy.  Neurological: She is alert and oriented to person, place, and time. She has normal reflexes. No cranial nerve deficit.  Skin: Skin is warm and dry. She is not diaphoretic.  Psychiatric: She has a normal mood and affect. Her behavior is normal.          Assessment & Plan:  Significant swelling of both the Prozac and with the Adderall therefore we are going to try Strattera Discussed Strattera and gave her samples for at least 3 months We also discussed the swallowing issue and the need for a EGD because of the dysplasia in the interim I will place her on Nexium samples

## 2013-06-27 NOTE — Patient Instructions (Signed)
Stop adderal for 1 full week then start the strattera pack One a day in the AM

## 2013-06-27 NOTE — Progress Notes (Signed)
Pre-visit discussion using our clinic review tool. No additional management support is needed unless otherwise documented below in the visit note.  

## 2013-06-29 ENCOUNTER — Encounter: Payer: Self-pay | Admitting: Internal Medicine

## 2013-07-01 ENCOUNTER — Telehealth: Payer: Self-pay | Admitting: Internal Medicine

## 2013-07-01 DIAGNOSIS — F988 Other specified behavioral and emotional disorders with onset usually occurring in childhood and adolescence: Secondary | ICD-10-CM

## 2013-07-01 MED ORDER — METHYLPHENIDATE HCL ER (OSM) 36 MG PO TBCR: 36.0000 mg | EXTENDED_RELEASE_TABLET | Freq: Every day | ORAL | Status: DC

## 2013-07-01 NOTE — Telephone Encounter (Signed)
Left message for pt to call back  °

## 2013-07-01 NOTE — Telephone Encounter (Signed)
Try concerta rx written please place out front

## 2013-07-01 NOTE — Telephone Encounter (Signed)
I received a fax back from [facility] stating pt needs to try 1 other alternative before they will approve Strattera. Concerta and Ritalin LA are part of the list of approved medications given.  I already informed them pt tried Adderall.  Please advise.

## 2013-07-04 NOTE — Telephone Encounter (Signed)
Pt aware.

## 2013-07-08 ENCOUNTER — Telehealth: Payer: Self-pay | Admitting: *Deleted

## 2013-07-08 NOTE — Telephone Encounter (Signed)
Pt called c/o laryngitis and cold like sx.  Suggested saline nasal spray an d mucinex fast max per dr Lovell Sheehan- if not better by Monday, please call back

## 2013-07-08 NOTE — Telephone Encounter (Signed)
Opened in error

## 2013-07-15 ENCOUNTER — Ambulatory Visit (INDEPENDENT_AMBULATORY_CARE_PROVIDER_SITE_OTHER): Payer: BC Managed Care – PPO | Admitting: Family Medicine

## 2013-07-15 ENCOUNTER — Telehealth: Payer: Self-pay | Admitting: Internal Medicine

## 2013-07-15 ENCOUNTER — Encounter: Payer: Self-pay | Admitting: Family Medicine

## 2013-07-15 VITALS — BP 120/72 | Temp 98.9°F | Wt 203.0 lb

## 2013-07-15 DIAGNOSIS — J329 Chronic sinusitis, unspecified: Secondary | ICD-10-CM

## 2013-07-15 MED ORDER — AMOXICILLIN 875 MG PO TABS: 875.0000 mg | ORAL_TABLET | Freq: Two times a day (BID) | ORAL | Status: DC

## 2013-07-15 NOTE — Progress Notes (Signed)
Chief Complaint  Patient presents with  . Sinusitis    HPI:  -started: about 2 weeks ago -symptoms:nasal congestion, sore throat, cough, drainage, laryngitis for a few days, a little sinus pain and sinus symptoms have persisted -denies:fever, SOB, NVD, tooth pain -has tried: saline -sick contacts/travel/risks: denies flu exposure, tick exposure or or Ebola risks -Hx of: sinusitis  ROS: See pertinent positives and negatives per HPI.  Past Medical History  Diagnosis Date  . Hypertension   . Thyroid disease   . GERD (gastroesophageal reflux disease)   . Arthritis   . Carpal tunnel syndrome   . Anxiety   . ADD (attention deficit disorder)     Pt has hard time focusing    Past Surgical History  Procedure Laterality Date  . Cholecystectomy    . Abdominal hysterectomy    . Tubal ligation    . Fx leg    . Nasal sinus surgery    . Closed reduction hand fracture      right hand    Family History  Problem Relation Age of Onset  . Lung cancer    . Hyperlipidemia Mother   . Cancer Father     History   Social History  . Marital Status: Married    Spouse Name: N/A    Number of Children: N/A  . Years of Education: N/A   Occupational History  . retired    Social History Main Topics  . Smoking status: Never Smoker   . Smokeless tobacco: Never Used  . Alcohol Use: No  . Drug Use: No  . Sexual Activity: No   Other Topics Concern  . None   Social History Narrative  . None    Current outpatient prescriptions:amphetamine-dextroamphetamine (ADDERALL XR) 30 MG 24 hr capsule, Take 1 capsule (30 mg total) by mouth every morning., Disp: 30 capsule, Rfl: 0;  atomoxetine (STRATTERA) 80 MG capsule, Take 1 capsule (80 mg total) by mouth daily., Disp: 30 capsule, Rfl: 5;  bisoprolol-hydrochlorothiazide (ZIAC) 5-6.25 MG per tablet, TAKE ONE TABLET BY MOUTH ONCE DAILY, Disp: 90 tablet, Rfl: 3 celecoxib (CELEBREX) 200 MG capsule, Take 200 mg by mouth daily as needed.  , Disp: ,  Rfl: ;  Cholecalciferol (VITAMIN D HIGH POTENCY) 1000 UNITS capsule, Take 1,000 Units by mouth daily.  , Disp: , Rfl: ;  diazepam (VALIUM) 10 MG tablet, TAKE ONE-HALF TABLET BY MOUTH EVERY 12 HOURS AS NEEDED, Disp: 30 tablet, Rfl: 0;  docusate sodium (COLACE) 100 MG capsule, Take 100 mg by mouth daily. Equate stool softener-Take one daily, Disp: , Rfl:  ketoconazole (NIZORAL) 2 % shampoo, Apply topically 2 (two) times a week. Both to back and scalp, Disp: 120 mL, Rfl: 0;  lansoprazole (PREVACID) 30 MG capsule, Take 30 mg by mouth daily.  , Disp: , Rfl: ;  levothyroxine (SYNTHROID, LEVOTHROID) 137 MCG tablet, TAKE ONE TABLET BY MOUTH EVERY DAY, Disp: 90 tablet, Rfl: 3;  methylphenidate (CONCERTA) 36 MG CR tablet, Take 1 tablet (36 mg total) by mouth daily., Disp: 30 tablet, Rfl: 0 amoxicillin (AMOXIL) 875 MG tablet, Take 1 tablet (875 mg total) by mouth 2 (two) times daily., Disp: 20 tablet, Rfl: 0  EXAM:  Filed Vitals:   07/15/13 1405  BP: 120/72  Temp: 98.9 F (37.2 C)    Body mass index is 35.97 kg/(m^2).  GENERAL: vitals reviewed and listed above, alert, oriented, appears well hydrated and in no acute distress  HEENT: atraumatic, conjunttiva clear, no obvious abnormalities on inspection of  external nose and ears, normal appearance of ear canals and TMs, clear nasal congestion, mild post oropharyngeal erythema with PND, no tonsillar edema or exudate, no sinus TTP  NECK: no obvious masses on inspection  LUNGS: clear to auscultation bilaterally, no wheezes, rales or rhonchi, good air movement  CV: HRRR, no peripheral edema  MS: moves all extremities without noticeable abnormality  PSYCH: pleasant and cooperative, no obvious depression or anxiety  ASSESSMENT AND PLAN:  Discussed the following assessment and plan:  Sinusitis - Plan: amoxicillin (AMOXIL) 875 MG tablet  -given HPI and exam findings today, a serious infection or illness is unlikely. We discussed potential etiologies,  with VURI being most likely but bacterial sinusitis possible given ongoing symptoms, advised supportive care, abx and monitoring. We discussed treatment side effects, likely course, antibiotic misuse, transmission, and signs of developing a serious illness. -of course, we advised to return or notify a doctor immediately if symptoms worsen or persist or new concerns arise.    Patient Instructions  INSTRUCTIONS FOR UPPER RESPIRATORY INFECTION:  -plenty of rest and fluids  --As we discussed, we have prescribed a new medication (amoxicillin) for you at this appointment. We discussed the common and serious potential adverse effects of this medication and you can review these and more with the pharmacist when you pick up your medication.  Please follow the instructions for use carefully and notify us immediately if you have any problems taking this medication.  -nasal saline wash 2-3 times daily (use prepackaged nasal saline or bottled/distilled water if making your own)   -can use sinex nasal spray for drainage and nasal congestion - but do NOT use longer then 3-4 days  -can use tylenol or ibuprofen as directed for aches and sorethroat  -in the winter time, using a humidifier at night is helpful (please follow cleaning instructions)  -if you are taking a cough medication - use only as directed, may also try a teaspoon of honey to coat the throat and throat lozenges  -for sore throat, salt water gargles can help  -follow up if you have fevers, facial pain, tooth pain, difficulty breathing or are worsening or not getting better in 5-7 days      KIM, HANNAH R.

## 2013-07-15 NOTE — Telephone Encounter (Signed)
Patient Information:  Caller Name: Mala  Phone: [phone]  Patient: Paige, [name]  Gender: Female  DOB: 1951-08-15  Age: 62 Years  PCP: Darryll Capers (Adults only)  Office Follow Up:  Does the office need to follow up with this patient?: No  Instructions For The Office: N/A  RN Note:  Patient states she developed sore throat, followed by Laryngitis, onset 06/24/13. Patient states she was seen in the office on 06/27/13. Patient states the left side of her nasal passage became "blocked" and she noted an enlarged lymph node on left side of neck, onset 07/08/13. States she called office and was advised to use saline nasal washes. Patient states no improvement noted. Patient states she continue to have sore throat. Patient taking fluids well. Afebrile. Patient states she feels discomfort in her mouth and gums. Care advice given per guidelines. Call back parameters reviewed. Patient verbalizes understanding.  Symptoms  Reason For Call & Symptoms: Sore Throat, nasal congestion/Pain  Reviewed Health History In EMR: Yes  Reviewed Medications In EMR: Yes  Reviewed Allergies In EMR: Yes  Reviewed Surgeries / Procedures: Yes  Date of Onset of Symptoms: 06/24/2013  Treatments Tried: Saline nasal washes  Treatments Tried Worked: No  Guideline(s) Used:  Sinus Pain and Congestion  Disposition Per Guideline:   See Today or Tomorrow in Office  Reason For Disposition Reached:   Sinus congestion (pressure, fullness) present > 10 days  Advice Given:  For a Stuffy Nose - Use Nasal Washes:  Introduction: Saline (salt water) nasal irrigation (nasal wash) is an effective and simple home remedy for treating stuffy nose and sinus congestion. The nose can be irrigated by pouring, spraying, or squirting salt water into the nose and then letting it run back out.  Hydration:  Drink plenty of liquids (6-8 glasses of water daily). If the air in your home is dry, use a cool mist humidifier  Call Back  If:   You become worse.  Neti Pot  The EchoStar is a Oceanographer or plastic pot with a narrow spout. It looks like a small tea pot. Two manufacturers of the EchoStar are the  [employer] in [place] and  [employer] in [place].  Patient Will Follow Care Advice:  YES  Appointment Scheduled:  07/15/2013 14:30:00 Appointment Scheduled Provider:  Selena Batten (TEXT 1st, after 20 mins can call), [facility])

## 2013-07-15 NOTE — Progress Notes (Signed)
Pre visit review using our clinic review tool, if applicable. No additional management support is needed unless otherwise documented below in the visit note. 

## 2013-07-15 NOTE — Patient Instructions (Signed)
INSTRUCTIONS FOR UPPER RESPIRATORY INFECTION:  -plenty of rest and fluids  --As we discussed, we have prescribed a new medication (amoxicillin) for you at this appointment. We discussed the common and serious potential adverse effects of this medication and you can review these and more with the pharmacist when you pick up your medication.  Please follow the instructions for use carefully and notify us immediately if you have any problems taking this medication.  -nasal saline wash 2-3 times daily (use prepackaged nasal saline or bottled/distilled water if making your own)   -can use sinex nasal spray for drainage and nasal congestion - but do NOT use longer then 3-4 days  -can use tylenol or ibuprofen as directed for aches and sorethroat  -in the winter time, using a humidifier at night is helpful (please follow cleaning instructions)  -if you are taking a cough medication - use only as directed, may also try a teaspoon of honey to coat the throat and throat lozenges  -for sore throat, salt water gargles can help  -follow up if you have fevers, facial pain, tooth pain, difficulty breathing or are worsening or not getting better in 5-7 days

## 2013-08-03 ENCOUNTER — Other Ambulatory Visit: Payer: Self-pay | Admitting: Internal Medicine

## 2013-08-05 ENCOUNTER — Other Ambulatory Visit: Payer: Self-pay | Admitting: *Deleted

## 2013-08-05 DIAGNOSIS — F988 Other specified behavioral and emotional disorders with onset usually occurring in childhood and adolescence: Secondary | ICD-10-CM

## 2013-08-05 MED ORDER — METHYLPHENIDATE HCL ER (OSM) 36 MG PO TBCR: 36.0000 mg | EXTENDED_RELEASE_TABLET | Freq: Every day | ORAL | Status: DC

## 2013-08-16 ENCOUNTER — Encounter: Payer: Self-pay | Admitting: Internal Medicine

## 2013-08-16 ENCOUNTER — Ambulatory Visit (INDEPENDENT_AMBULATORY_CARE_PROVIDER_SITE_OTHER): Payer: BC Managed Care – PPO | Admitting: Internal Medicine

## 2013-08-16 VITALS — BP 120/76 | HR 84 | Ht 61.5 in | Wt 205.1 lb

## 2013-08-16 DIAGNOSIS — K219 Gastro-esophageal reflux disease without esophagitis: Secondary | ICD-10-CM

## 2013-08-16 DIAGNOSIS — K635 Polyp of colon: Secondary | ICD-10-CM

## 2013-08-16 DIAGNOSIS — R1310 Dysphagia, unspecified: Secondary | ICD-10-CM

## 2013-08-16 DIAGNOSIS — D126 Benign neoplasm of colon, unspecified: Secondary | ICD-10-CM

## 2013-08-16 DIAGNOSIS — K5900 Constipation, unspecified: Secondary | ICD-10-CM

## 2013-08-16 NOTE — Patient Instructions (Addendum)
You have been scheduled for an endoscopy with propofol. Please follow written instructions given to you at your visit today. If you use inhalers (even only as needed), please bring them with you on the day of your procedure. Your physician has requested that you go to www.startemmi.com and enter the access code given to you at your visit today. This web site gives a general overview about your procedure. However, you should still follow specific instructions given to you by our office regarding your preparation for the procedure.  You have been given some reflux precaution information

## 2013-08-16 NOTE — Progress Notes (Signed)
HISTORY OF PRESENT ILLNESS:  Paige [name] is a 62 y.o. female with the below listed medical history who is referred today by [name] regarding dysphagia. Patient was last seen in August of 2013 for routine screening colonoscopy. She was found to have a 15 mm pedunculated sigmoid colon polyp which was removed and found to be a tubular adenoma. As well, melanosis coli. Followup in 3 years recommended. She presents now with a several month history of intermittent dysphagia with meals. Mostly solids. She also has significant globus type sensation in the throat without meals. She has used Prevacid off and on over the years for regurgitation and pyrosis. Over the past 6 months moderate weight gain. She has been using Prevacid somewhat more frequently recently with improvement in symptoms. Last time she took Prevacid was about 2 days ago. GI review of systems is otherwise remarkable for constipation.  REVIEW OF SYSTEMS:  All non-GI ROS negative except for sinus and allergy trouble, anxiety, arthritis, back pain, visual change, confusion, cough, depression, fatigue, headaches, hearing problems, itching, muscle cramps, shortness of breath, sleeping problems, excessive urination, urinary leakage  Past Medical History  Diagnosis Date  . Hypertension   . Thyroid disease   . GERD (gastroesophageal reflux disease)   . Arthritis   . Carpal tunnel syndrome   . Anxiety   . ADD (attention deficit disorder)     Pt has hard time focusing  . Colon polyp   . Depression   . Status post dilation of esophageal narrowing   . Gallstones     Past Surgical History  Procedure Laterality Date  . Cholecystectomy    . Abdominal hysterectomy    . Tubal ligation    . Fx leg    . Nasal sinus surgery    . Closed reduction hand fracture      right hand  . Carpal tunnel release Left     Social History Paige [name]  reports that she has never smoked. She has never used smokeless tobacco. She reports  that she does not drink alcohol or use illicit drugs.  family history includes Cancer in her paternal grandfather; Hyperlipidemia in her mother; Leukemia in her maternal aunt; Lung cancer in her father.  Allergies  Allergen Reactions  . Asa [Aspirin] Other (See Comments)    SOB and blocks ears and nose  . Morphine And Related        PHYSICAL EXAMINATION: Vital signs: BP 120/76  Pulse 84  Ht 5' 1.5" (1.562 m)  Wt 205 lb 2 oz (93.044 kg)  BMI 38.14 kg/m2 General: Well-developed, well-nourished, no acute distress HEENT: Sclerae are anicteric, conjunctiva pink. Oral mucosa intact Lungs: Clear Heart: Regular Abdomen: soft, nontender, nondistended, no obvious ascites, no peritoneal signs, normal bowel sounds. No organomegaly. Extremities: No edema Psychiatric: alert and oriented x3. Cooperative     ASSESSMENT:  #1. Recent problems with globus type sensation and mild vague dysphagia. Likely due to reflux. #2. GERD. Symptoms intermittently. On demand PPI use as described #3. History of advanced adenoma 2013 #4. Chronic constipation. Melanosis coli   PLAN:  #1. Reflux precautions #2. Take PPI daily for now #3. Schedule upper endoscopy with possible esophageal dilation.The nature of the procedure, as well as the risks, benefits, and alternatives were carefully and thoroughly reviewed with the patient. Ample time for discussion and questions allowed. The patient understood, was satisfied, and agreed to proceed. #4. Surveillance colonoscopy 2016 #5. Increase water and fiber consumption

## 2013-08-17 ENCOUNTER — Telehealth: Payer: Self-pay | Admitting: Internal Medicine

## 2013-08-19 ENCOUNTER — Encounter: Payer: Self-pay | Admitting: Internal Medicine

## 2013-08-23 NOTE — Telephone Encounter (Signed)
Appt cx due to insurance

## 2013-11-07 ENCOUNTER — Encounter: Payer: Self-pay | Admitting: Internal Medicine

## 2013-11-07 ENCOUNTER — Ambulatory Visit (INDEPENDENT_AMBULATORY_CARE_PROVIDER_SITE_OTHER): Payer: BC Managed Care – PPO | Admitting: Internal Medicine

## 2013-11-07 VITALS — BP 120/78 | HR 84 | Temp 98.0°F | Ht 61.0 in | Wt 202.0 lb

## 2013-11-07 DIAGNOSIS — F411 Generalized anxiety disorder: Secondary | ICD-10-CM

## 2013-11-07 DIAGNOSIS — F988 Other specified behavioral and emotional disorders with onset usually occurring in childhood and adolescence: Secondary | ICD-10-CM

## 2013-11-07 MED ORDER — CELECOXIB 200 MG PO CAPS: 200.0000 mg | ORAL_CAPSULE | Freq: Two times a day (BID) | ORAL | Status: DC

## 2013-11-07 MED ORDER — BUPROPION HCL ER (XL) 300 MG PO TB24: 300.0000 mg | ORAL_TABLET | Freq: Every day | ORAL | Status: DC

## 2013-11-07 MED ORDER — LEVOTHYROXINE SODIUM 137 MCG PO TABS: ORAL_TABLET | ORAL | Status: DC

## 2013-11-07 MED ORDER — BISOPROLOL-HYDROCHLOROTHIAZIDE 5-6.25 MG PO TABS: ORAL_TABLET | ORAL | Status: DC

## 2013-11-07 NOTE — Progress Notes (Signed)
   Subjective:    Patient ID: Paige [name], female    DOB: 1951/08/07, 63 y.o.   MRN: [id number]  HPI Blood pressure is good but emotionally she has been under stress and has been in bankruptcy court. Has noted argumentative and aggressive behavior with her faimily and her pet! Has been off the concerta.due to cost. Suggest Wellbutrin for mood and ADD  Review of Systems  Constitutional: Positive for fatigue. Negative for activity change and appetite change.  HENT: Negative for congestion, ear pain, postnasal drip and sinus pressure.   Eyes: Negative for redness and visual disturbance.  Respiratory: Negative for cough, shortness of breath and wheezing.   Gastrointestinal: Negative for abdominal pain and abdominal distention.  Genitourinary: Negative for dysuria, frequency and menstrual problem.  Musculoskeletal: Negative for arthralgias, joint swelling, myalgias and neck pain.  Skin: Negative for rash and wound.  Neurological: Positive for weakness. Negative for dizziness and headaches.  Hematological: Negative for adenopathy. Does not bruise/bleed easily.  Psychiatric/Behavioral: Positive for behavioral problems and sleep disturbance. Negative for decreased concentration. The patient is nervous/anxious.        Objective:   Physical Exam  Nursing note and vitals reviewed. Constitutional: She is oriented to person, place, and time. She appears well-developed and well-nourished.  HENT:  Head: Atraumatic.  Eyes: Conjunctivae are normal.  Neck: Neck supple.  Cardiovascular: Normal rate and regular rhythm.   Pulmonary/Chest: Effort normal and breath sounds normal.  Abdominal: Bowel sounds are normal.  Neurological: She is alert and oriented to person, place, and time.          Assessment & Plan:  Resume the Wellbutrin  300 xl  ADD stop the concerta Monitor on wlebutrin Stable HTN

## 2013-11-07 NOTE — Progress Notes (Signed)
Pre visit review using our clinic review tool, if applicable. No additional management support is needed unless otherwise documented below in the visit note. 

## 2013-11-07 NOTE — Patient Instructions (Signed)
The patient is instructed to continue all medications as prescribed. Schedule followup with check out clerk upon leaving the clinic  

## 2013-12-21 ENCOUNTER — Telehealth: Payer: Self-pay | Admitting: Internal Medicine

## 2013-12-21 NOTE — Telephone Encounter (Signed)
Pt saw md in march 2015 and is calling to report  The wellbutrin is not working as well as she think it should. Please advise

## 2013-12-22 NOTE — Telephone Encounter (Signed)
Patient should come in for an office visit.  Patient is aware and an appointment made.

## 2014-01-11 ENCOUNTER — Ambulatory Visit (INDEPENDENT_AMBULATORY_CARE_PROVIDER_SITE_OTHER): Payer: BC Managed Care – PPO | Admitting: Family

## 2014-01-11 ENCOUNTER — Encounter: Payer: Self-pay | Admitting: Family

## 2014-01-11 VITALS — BP 110/70 | HR 87 | Temp 98.4°F | Wt 202.5 lb

## 2014-01-11 DIAGNOSIS — E039 Hypothyroidism, unspecified: Secondary | ICD-10-CM

## 2014-01-11 DIAGNOSIS — F3289 Other specified depressive episodes: Secondary | ICD-10-CM

## 2014-01-11 DIAGNOSIS — F411 Generalized anxiety disorder: Secondary | ICD-10-CM

## 2014-01-11 DIAGNOSIS — F329 Major depressive disorder, single episode, unspecified: Secondary | ICD-10-CM

## 2014-01-11 DIAGNOSIS — F32A Depression, unspecified: Secondary | ICD-10-CM

## 2014-01-11 MED ORDER — QUETIAPINE FUMARATE 25 MG PO TABS: 25.0000 mg | ORAL_TABLET | Freq: Every day | ORAL | Status: DC

## 2014-01-11 NOTE — Patient Instructions (Signed)
Mood Disorders  Mood disorders are conditions that affect the way a person feels emotionally. The main mood disorders include:  · Depression.  · Bipolar disorder.  · Dysthymia. Dysthymia is a mild, lasting (chronic) depression. Symptoms of dysthymia are similar to depression, but not as severe.  · Cyclothymia. Cyclothymia includes mood swings, but the highs and lows are not as severe as they are in bipolar disorder. Symptoms of cyclothymia are similar to those of bipolar disorder, but less extreme.  CAUSES   Mood disorders are probably caused by a combination of factors. People with mood disorders seem to have physical and chemical changes in their brains. Mood disorders run in families, so there may be genetic causes. Severe trauma or stressful life events may also increase the risk of mood disorders.   SYMPTOMS   Symptoms of mood disorders depend on the specific type of condition.  Depression symptoms include:  · Feeling sad, worthless, or hopeless.  · Negative thoughts.  · Inability to enjoy one's usual activities.  · Low energy.  · Sleeping too much or too little.  · Appetite changes.  · Crying.  · Concentration problems.  · Thoughts of harming oneself.  Bipolar disorder symptoms include:  · Periods of depression (see above symptoms).  · Mood swings, from sadness and depression, to abnormal elation and excitement.  · Periods of mania:  · Racing thoughts.  · Fast speech.  · Poor judgment, and careless, dangerous choices.  · Decreased need for sleep.  · Risky behavior.  · Difficulty concentrating.  · Irritability.  · Increased energy.  · Increased sex drive.  DIAGNOSIS   There are no blood tests or X-rays that can confirm a mood disorder. However, your caregiver may choose to run some tests to make sure that there is not another physical cause for your symptoms. A mood disorder is usually diagnosed after an in-depth interview with a caregiver.  TREATMENT   Mood disorders can be treated with one or more of the  following:  · Medicine. This may include antidepressants, mood-stabilizers, or anti-psychotics.  · Psychotherapy (talk therapy).  · Cognitive behavioral therapy. You are taught to recognize negative thoughts and behavior patterns, and replace them with healthy thoughts and behaviors.  · Electroconvulsive therapy. For very severe cases of deep depression, a series of treatments in which an electrical current is applied to the brain.  · Vagus nerve stimulation. A pulse of electricity is applied to a portion of the brain.  · Transcranial magnetic stimulation. Powerful magnets are placed on the head that produce electrical currents.  · Hospitalization. In severe situations, or when someone is having serious thoughts of harming him or herself, hospitalization may be necessary in order to keep the person safe. This is also done to quickly start and monitor treatment.  HOME CARE INSTRUCTIONS   · Take your medicine exactly as directed.  · Attend all of your therapy sessions.  · Try to eat regular, healthy meals.  · Exercise daily. Exercise may improve mood symptoms.  · Get good sleep.  · Do not drink alcohol or use pot or other drugs. These can worsen mood symptoms and cause anxiety and psychosis.  · Tell your caregiver if you develop any side effects, such as feeling sick to your stomach (nauseous), dry mouth, dizziness, constipation, drowsiness, tremor, weight gain, or sexual symptoms. He or she may suggest things you can do to improve symptoms.  · Learn ways to cope with the stress of having a   chronic illness. This includes yoga, meditation, tai chi, or participating in a support group.  · Drink enough water to keep your urine clear or pale yellow. Eat a high-fiber diet. These habits may help you avoid constipation from your medicine.  SEEK IMMEDIATE MEDICAL CARE IF:  · Your mood worsens.  · You have thoughts of hurting yourself or others.  · You cannot care for yourself.  · You develop the sensation of hearing or seeing  something that is not actually present (auditory or visual hallucinations).  · You develop abnormal thoughts.  Document Released: 06/01/2009 Document Revised: 10/27/2011 Document Reviewed: 06/01/2009  ExitCare® Patient Information ©2014 [employer].

## 2014-01-11 NOTE — Progress Notes (Signed)
Pre visit review using our clinic review tool, if applicable. No additional management support is needed unless otherwise documented below in the visit note. 

## 2014-01-12 ENCOUNTER — Encounter: Payer: Self-pay | Admitting: Family

## 2014-01-12 LAB — TSH: TSH: 0.14 u[IU]/mL — ABNORMAL LOW (ref 0.35–4.50)

## 2014-01-12 NOTE — Progress Notes (Signed)
Subjective:    Patient ID: Paige [name], female    DOB: August 03, 1951, 63 y.o.   MRN: [id number]  HPI 63 year old, WF, nonsmoker, is in today for a recheck of Hypertension, Osteoarthritis, Hypothyroidism, and Depression. She is currently on Wellbutrin that she feels has been ineffective. Continues to report irritability and aggression. Also reports being unable to sleep well at night because she "can not turn her brain off."  She has been on Lexapro, Prozac, Zoloft. She has a brother and father with Bipolar disorder. She has not been diagnosed with a mood disorder.    Review of Systems  Constitutional: Negative.   HENT: Negative.   Respiratory: Negative.   Cardiovascular: Negative.   Gastrointestinal: Negative.   Endocrine: Negative.   Genitourinary: Negative.   Musculoskeletal: Negative.   Neurological: Negative.   Hematological: Negative.   Psychiatric/Behavioral: Negative.    Past Medical History  Diagnosis Date  . Hypertension   . Thyroid disease   . GERD (gastroesophageal reflux disease)   . Arthritis   . Carpal tunnel syndrome   . Anxiety   . ADD (attention deficit disorder)     Pt has hard time focusing  . Colon polyp   . Depression   . Status post dilation of esophageal narrowing   . Gallstones     History   Social History  . Marital Status: Married    Spouse Name: N/A    Number of Children: 2  . Years of Education: N/A   Occupational History  . retired   . [place] History Main Topics  . Smoking status: Never Smoker   . Smokeless tobacco: Never Used  . Alcohol Use: No  . Drug Use: No  . Sexual Activity: No   Other Topics Concern  . Not on file   Social History Narrative  . No narrative on file    Past Surgical History  Procedure Laterality Date  . Cholecystectomy    . Abdominal hysterectomy    . Tubal ligation    . Fx leg    . Nasal sinus surgery    . Closed reduction hand fracture      right hand    . Carpal tunnel release Left     Family History  Problem Relation Age of Onset  . Lung cancer Father   . Hyperlipidemia Mother   . Cancer Paternal Grandfather     mets  . Leukemia Maternal Aunt     Allergies  Allergen Reactions  . Asa [Aspirin] Other (See Comments)    SOB and blocks ears and nose  . Morphine And Related     Current Outpatient Prescriptions on File Prior to Visit  Medication Sig Dispense Refill  . bisoprolol-hydrochlorothiazide (ZIAC) 5-6.25 MG per tablet TAKE ONE TABLET BY MOUTH ONCE DAILY  90 tablet  3  . diazepam (VALIUM) 10 MG tablet TAKE ONE-HALF TABLET BY MOUTH EVERY 12 HOURS AS NEEDED  30 tablet  2  . docusate sodium (COLACE) 100 MG capsule Take 100 mg by mouth daily. Equate stool softener-Take one daily      . lansoprazole (PREVACID) 30 MG capsule Take 30 mg by mouth daily.        Marland Kitchen levothyroxine (SYNTHROID, LEVOTHROID) 137 MCG tablet TAKE ONE TABLET BY MOUTH EVERY DAY  90 tablet  3  . celecoxib (CELEBREX) 200 MG capsule Take 1 capsule (200 mg total) by mouth 2 (two) times daily.  60 capsule  11  No current facility-administered medications on file prior to visit.    BP 110/70  Pulse 87  Temp(Src) 98.4 F (36.9 C) (Oral)  Wt 202 lb 8 oz (91.853 kg)  SpO2 98%chart    Objective:   Physical Exam  Constitutional: She is oriented to person, place, and time. She appears well-developed and well-nourished.  Neck: Normal range of motion. Neck supple.  Cardiovascular: Normal rate, regular rhythm and normal heart sounds.   Pulmonary/Chest: Effort normal and breath sounds normal.  Musculoskeletal: Normal range of motion.  Neurological: She is alert and oriented to person, place, and time.  Skin: Skin is warm and dry.  Psychiatric: She has a normal mood and affect.          Assessment & Plan:  Paige [name] was seen today for follow-up.  Diagnoses and associated orders for this visit:  Depression  Anxiety state, unspecified  Unspecified  hypothyroidism - TSH  Other Orders - QUEtiapine (SEROQUEL) 25 MG tablet; Take 1 tablet (25 mg total) by mouth at bedtime.   Call the office with any questions concerns. Recheck in 3 weeks and sooner as needed.

## 2014-01-13 ENCOUNTER — Telehealth: Payer: Self-pay

## 2014-01-13 MED ORDER — LEVOTHYROXINE SODIUM 125 MCG PO TABS: 125.0000 ug | ORAL_TABLET | Freq: Every day | ORAL | Status: DC

## 2014-01-13 NOTE — Telephone Encounter (Signed)
Pt aware of lab results and Rx sent to pharmacy

## 2014-02-01 ENCOUNTER — Ambulatory Visit (INDEPENDENT_AMBULATORY_CARE_PROVIDER_SITE_OTHER): Payer: BC Managed Care – PPO | Admitting: Family

## 2014-02-01 VITALS — BP 120/76 | HR 81 | Temp 98.9°F | Wt 203.0 lb

## 2014-02-01 DIAGNOSIS — F32A Depression, unspecified: Secondary | ICD-10-CM

## 2014-02-01 DIAGNOSIS — I10 Essential (primary) hypertension: Secondary | ICD-10-CM

## 2014-02-01 DIAGNOSIS — E039 Hypothyroidism, unspecified: Secondary | ICD-10-CM

## 2014-02-01 DIAGNOSIS — F329 Major depressive disorder, single episode, unspecified: Secondary | ICD-10-CM

## 2014-02-01 DIAGNOSIS — L570 Actinic keratosis: Secondary | ICD-10-CM

## 2014-02-01 DIAGNOSIS — F3289 Other specified depressive episodes: Secondary | ICD-10-CM

## 2014-02-02 ENCOUNTER — Encounter: Payer: Self-pay | Admitting: Family

## 2014-02-02 MED ORDER — LAMOTRIGINE 25 MG PO TBDP: 25.0000 mg | ORAL_TABLET | Freq: Every day | ORAL | Status: DC

## 2014-02-02 NOTE — Addendum Note (Signed)
Addended by: Santiago Bumpers on: 02/02/2014 11:22 AM   Modules accepted: Medications

## 2014-02-02 NOTE — Progress Notes (Signed)
Pre visit review using our clinic review tool, if applicable. No additional management support is needed unless otherwise documented below in the visit note. 

## 2014-02-02 NOTE — Progress Notes (Signed)
Subjective:    Patient ID: Paige [name], female    DOB: 1950-08-30, 63 y.o.   MRN: [id number]  HPI 63 year old white female, nonsmoker with a history of hypothyroidism, hypertension, is in today for recheck of insomnia, depression and anxiety. She started Seroquel but reports that it causes extreme drowsiness that lingers over in today. Has not noticed any effect on her mood.  Also has concerns of 3 lesions on her back that have been present for several months she describes as rubbery. Has a history of actinic keratosis.   Review of Systems  Constitutional: Negative.   Respiratory: Negative.   Cardiovascular: Negative.   Endocrine: Negative.   Genitourinary: Negative.   Skin:       Skin lesion mid back  Allergic/Immunologic: Negative.   Neurological: Negative.   Psychiatric/Behavioral: Positive for agitation. Negative for sleep disturbance. The patient is nervous/anxious.    Past Medical History  Diagnosis Date  . Hypertension   . Thyroid disease   . GERD (gastroesophageal reflux disease)   . Arthritis   . Carpal tunnel syndrome   . Anxiety   . ADD (attention deficit disorder)     Pt has hard time focusing  . Colon polyp   . Depression   . Status post dilation of esophageal narrowing   . Gallstones     History   Social History  . Marital Status: Married    Spouse Name: N/A    Number of Children: 2  . Years of Education: N/A   Occupational History  . retired   . [place] History Main Topics  . Smoking status: Never Smoker   . Smokeless tobacco: Never Used  . Alcohol Use: No  . Drug Use: No  . Sexual Activity: No   Other Topics Concern  . Not on file   Social History Narrative  . No narrative on file    Past Surgical History  Procedure Laterality Date  . Cholecystectomy    . Abdominal hysterectomy    . Tubal ligation    . Fx leg    . Nasal sinus surgery    . Closed reduction hand fracture      right hand    . Carpal tunnel release Left     Family History  Problem Relation Age of Onset  . Lung cancer Father   . Hyperlipidemia Mother   . Cancer Paternal Grandfather     mets  . Leukemia Maternal Aunt     Allergies  Allergen Reactions  . Asa [Aspirin] Other (See Comments)    SOB and blocks ears and nose  . Morphine And Related     Current Outpatient Prescriptions on File Prior to Visit  Medication Sig Dispense Refill  . bisoprolol-hydrochlorothiazide (ZIAC) 5-6.25 MG per tablet TAKE ONE TABLET BY MOUTH ONCE DAILY  90 tablet  3  . celecoxib (CELEBREX) 200 MG capsule Take 1 capsule (200 mg total) by mouth 2 (two) times daily.  60 capsule  11  . diazepam (VALIUM) 10 MG tablet TAKE ONE-HALF TABLET BY MOUTH EVERY 12 HOURS AS NEEDED  30 tablet  2  . docusate sodium (COLACE) 100 MG capsule Take 100 mg by mouth daily. Equate stool softener-Take one daily      . lansoprazole (PREVACID) 30 MG capsule Take 30 mg by mouth daily.        Marland Kitchen levothyroxine (SYNTHROID, LEVOTHROID) 125 MCG tablet Take 1 tablet (125 mcg total) by mouth daily.  90 tablet  3   No current facility-administered medications on file prior to visit.    There were no vitals taken for this visit.chart     Objective:   Physical Exam  Constitutional: She appears well-developed and well-nourished.  HENT:  Right Ear: External ear normal.  Left Ear: External ear normal.  Nose: Nose normal.  Mouth/Throat: Oropharynx is clear and moist.  Neck: Normal range of motion. Neck supple. No thyromegaly present.  Cardiovascular: Normal rate, regular rhythm and normal heart sounds.   Pulmonary/Chest: Effort normal and breath sounds normal.  Abdominal: Soft. Bowel sounds are normal.  Musculoskeletal: Normal range of motion.  Neurological: She is alert.  Skin: Skin is dry.     Psychiatric: She has a normal mood and affect.          Assessment & Plan:   Problem List Items Addressed This Visit   HYPOTHYROIDISM   HYPERTENSION     Other Visit Diagnoses   Depression    -  Primary    Actinic keratoses          Advised return appointment for removal of skin lesion on her mid back. DC Seroquel and start Lamictal 25 mg. Recheck in 3 weeks. Call the office with any questions or concerns.

## 2014-02-09 ENCOUNTER — Telehealth: Payer: Self-pay | Admitting: Internal Medicine

## 2014-02-09 NOTE — Telephone Encounter (Signed)
Please call to schedule pt for a 30 min OV

## 2014-02-09 NOTE — Telephone Encounter (Signed)
Pt came in the day that  we lost power. She called today to say that Abby Potash was going to change her Quetiapine because it was a little to strong. Pt said that there a place on her back that Padonda was going to cut out she was told she would get a call back to discuss . Pt is having shoulder surgery on 02/15/14

## 2014-02-09 NOTE — Telephone Encounter (Signed)
Lm for pt to call back and schedule a ov

## 2014-03-20 ENCOUNTER — Telehealth: Payer: Self-pay | Admitting: Internal Medicine

## 2014-03-20 NOTE — Telephone Encounter (Signed)
Pt has been sch

## 2014-03-20 NOTE — Telephone Encounter (Signed)
Error/njr °

## 2014-03-31 ENCOUNTER — Encounter: Payer: Self-pay | Admitting: Family

## 2014-03-31 ENCOUNTER — Ambulatory Visit (INDEPENDENT_AMBULATORY_CARE_PROVIDER_SITE_OTHER): Payer: BC Managed Care – PPO | Admitting: Family

## 2014-03-31 VITALS — BP 120/76 | HR 74 | Wt 195.0 lb

## 2014-03-31 DIAGNOSIS — F32A Depression, unspecified: Secondary | ICD-10-CM

## 2014-03-31 DIAGNOSIS — L209 Atopic dermatitis, unspecified: Secondary | ICD-10-CM

## 2014-03-31 DIAGNOSIS — F329 Major depressive disorder, single episode, unspecified: Secondary | ICD-10-CM

## 2014-03-31 DIAGNOSIS — F3289 Other specified depressive episodes: Secondary | ICD-10-CM

## 2014-03-31 DIAGNOSIS — L2089 Other atopic dermatitis: Secondary | ICD-10-CM

## 2014-03-31 MED ORDER — TRIAMCINOLONE ACETONIDE 0.1 % EX CREA: 1.0000 "application " | TOPICAL_CREAM | Freq: Two times a day (BID) | CUTANEOUS | Status: DC

## 2014-03-31 MED ORDER — ATOMOXETINE HCL 40 MG PO CAPS: 40.0000 mg | ORAL_CAPSULE | Freq: Every day | ORAL | Status: DC

## 2014-03-31 NOTE — Progress Notes (Signed)
Subjective:    Patient ID: Paige [name], female    DOB: [birth year], 63 y.o.   MRN: [id number]  HPI 63 year old white female, nonsmoker is in today with complaints of a dry, patchy areas in the middle of her back x3 weeks that is improved. Has not applied anything for relief. No bleeding  Also has concerns of decreased concentration. She has a history of anxiety and depression. We've started Lamictal that was ineffective. Patient requesting to try Strattera as previously recommended by Dr. Arnoldo Morale. Denies any thoughts of hopelessness, hopelessness, possibly iodine.    Review of Systems  Constitutional: Negative.   HENT: Negative.   Respiratory: Negative.   Cardiovascular: Negative.   Endocrine: Negative.   Genitourinary: Negative.   Musculoskeletal: Negative.   Skin: Positive for color change and rash.  Allergic/Immunologic: Negative.   Neurological: Negative.   Hematological: Negative.   Psychiatric/Behavioral: Positive for decreased concentration.   Past Medical History  Diagnosis Date  . Hypertension   . Thyroid disease   . GERD (gastroesophageal reflux disease)   . Arthritis   . Carpal tunnel syndrome   . Anxiety   . ADD (attention deficit disorder)     Pt has hard time focusing  . Colon polyp   . Depression   . Status post dilation of esophageal narrowing   . Gallstones     History   Social History  . Marital Status: Married    Spouse Name: N/A    Number of Children: 2  . Years of Education: N/A   Occupational History  . retired   . [place] History Main Topics  . Smoking status: Never Smoker   . Smokeless tobacco: Never Used  . Alcohol Use: No  . Drug Use: No  . Sexual Activity: No   Other Topics Concern  . Not on file   Social History Narrative  . No narrative on file    Past Surgical History  Procedure Laterality Date  . Cholecystectomy    . Abdominal hysterectomy    . Tubal ligation    . Fx leg      . Nasal sinus surgery    . Closed reduction hand fracture      right hand  . Carpal tunnel release Left     Family History  Problem Relation Age of Onset  . Lung cancer Father   . Hyperlipidemia Mother   . Cancer Paternal Grandfather     mets  . Leukemia Maternal Aunt     Allergies  Allergen Reactions  . Asa [Aspirin] Other (See Comments)    SOB and blocks ears and nose  . Morphine And Related     Current Outpatient Prescriptions on File Prior to Visit  Medication Sig Dispense Refill  . bisoprolol-hydrochlorothiazide (ZIAC) 5-6.25 MG per tablet TAKE ONE TABLET BY MOUTH ONCE DAILY  90 tablet  3  . celecoxib (CELEBREX) 200 MG capsule Take 1 capsule (200 mg total) by mouth 2 (two) times daily.  60 capsule  11  . diazepam (VALIUM) 10 MG tablet TAKE ONE-HALF TABLET BY MOUTH EVERY 12 HOURS AS NEEDED  30 tablet  2  . docusate sodium (COLACE) 100 MG capsule Take 100 mg by mouth daily. Equate stool softener-Take one daily      . Hydrocodone-Acetaminophen 7.5-300 MG TABS       . lansoprazole (PREVACID) 30 MG capsule Take 30 mg by mouth daily.        Marland Kitchen levothyroxine (  SYNTHROID, LEVOTHROID) 125 MCG tablet Take 1 tablet (125 mcg total) by mouth daily.  90 tablet  3   No current facility-administered medications on file prior to visit.    BP 120/76  Pulse 74  Wt 195 lb (88.451 kg)chart    Objective:   Physical Exam  Constitutional: She is oriented to person, place, and time. She appears well-developed and well-nourished.  Neck: Normal range of motion. Neck supple.  Cardiovascular: Normal rate, regular rhythm and normal heart sounds.   Pulmonary/Chest: Effort normal and breath sounds normal.  Musculoskeletal: Normal range of motion.  Neurological: She is alert and oriented to person, place, and time.  Skin: Rash noted. There is erythema.     Psychiatric: She has a normal mood and affect.          Assessment & Plan:  Paige [name] was seen today for no specified  reason.  Diagnoses and associated orders for this visit:  Atopic dermatitis  Depression  Other Orders - atomoxetine (STRATTERA) 40 MG capsule; Take 1 capsule (40 mg total) by mouth daily. - triamcinolone cream (KENALOG) 0.1 %; Apply 1 application topically 2 (two) times daily.   Call the office with any questions or concerns. Recheck in 3-4 weeks and sooner as needed.

## 2014-03-31 NOTE — Progress Notes (Signed)
Pre visit review using our clinic review tool, if applicable. No additional management support is needed unless otherwise documented below in the visit note. 

## 2014-03-31 NOTE — Patient Instructions (Signed)
Eczema Eczema, also called atopic dermatitis, is a skin disorder that causes inflammation of the skin. It causes a red rash and dry, scaly skin. The skin becomes very itchy. Eczema is generally worse during the cooler winter months and often improves with the warmth of summer. Eczema usually starts showing signs in infancy. Some children outgrow eczema, but it may last through adulthood.  CAUSES  The exact cause of eczema is not known, but it appears to run in families. People with eczema often have a family history of eczema, allergies, asthma, or hay fever. Eczema is not contagious. Flare-ups of the condition may be caused by:   Contact with something you are sensitive or allergic to.   Stress. SIGNS AND SYMPTOMS  Dry, scaly skin.   Red, itchy rash.   Itchiness. This may occur before the skin rash and may be very intense.  DIAGNOSIS  The diagnosis of eczema is usually made based on symptoms and medical history. TREATMENT  Eczema cannot be cured, but symptoms usually can be controlled with treatment and other strategies. A treatment plan might include:  Controlling the itching and scratching.   Use over-the-counter antihistamines as directed for itching. This is especially useful at night when the itching tends to be worse.   Use over-the-counter steroid creams as directed for itching.   Avoid scratching. Scratching makes the rash and itching worse. It may also result in a skin infection (impetigo) due to a break in the skin caused by scratching.   Keeping the skin well moisturized with creams every day. This will seal in moisture and help prevent dryness. Lotions that contain alcohol and water should be avoided because they can dry the skin.   Limiting exposure to things that you are sensitive or allergic to (allergens).   Recognizing situations that cause stress.   Developing a plan to manage stress.  HOME CARE INSTRUCTIONS   Only take over-the-counter or  prescription medicines as directed by your health care provider.   Do not use anything on the skin without checking with your health care provider.   Keep baths or showers short (5 minutes) in warm (not hot) water. Use mild cleansers for bathing. These should be unscented. You may add nonperfumed bath oil to the bath water. It is best to avoid soap and bubble bath.   Immediately after a bath or shower, when the skin is still damp, apply a moisturizing ointment to the entire body. This ointment should be a petroleum ointment. This will seal in moisture and help prevent dryness. The thicker the ointment, the better. These should be unscented.   Keep fingernails cut short. Children with eczema may need to wear soft gloves or mittens at night after applying an ointment.   Dress in clothes made of cotton or cotton blends. Dress lightly, because heat increases itching.   A child with eczema should stay away from anyone with fever blisters or cold sores. The virus that causes fever blisters (herpes simplex) can cause a serious skin infection in children with eczema. SEEK MEDICAL CARE IF:   Your itching interferes with sleep.   Your rash gets worse or is not better within 1 week after starting treatment.   You see pus or soft yellow scabs in the rash area.   You have a fever.   You have a rash flare-up after contact with someone who has fever blisters.  Document Released: 08/01/2000 Document Revised: 05/25/2013 Document Reviewed: 03/07/2013 Exit[employer] Patient Information 2015 [employer]. This information   is not intended to replace advice given to you by your health care provider. Make sure you discuss any questions you have with your health care provider.  

## 2014-04-18 ENCOUNTER — Encounter: Payer: Self-pay | Admitting: Family

## 2014-04-18 ENCOUNTER — Telehealth: Payer: Self-pay | Admitting: Internal Medicine

## 2014-04-18 ENCOUNTER — Ambulatory Visit (INDEPENDENT_AMBULATORY_CARE_PROVIDER_SITE_OTHER): Payer: BC Managed Care – PPO | Admitting: Family

## 2014-04-18 VITALS — BP 120/72 | HR 81 | Wt 193.2 lb

## 2014-04-18 DIAGNOSIS — E039 Hypothyroidism, unspecified: Secondary | ICD-10-CM

## 2014-04-18 DIAGNOSIS — F329 Major depressive disorder, single episode, unspecified: Secondary | ICD-10-CM

## 2014-04-18 DIAGNOSIS — F32A Depression, unspecified: Secondary | ICD-10-CM

## 2014-04-18 DIAGNOSIS — I10 Essential (primary) hypertension: Secondary | ICD-10-CM

## 2014-04-18 DIAGNOSIS — F411 Generalized anxiety disorder: Secondary | ICD-10-CM

## 2014-04-18 MED ORDER — DIAZEPAM 10 MG PO TABS: ORAL_TABLET | ORAL | Status: DC

## 2014-04-18 NOTE — Patient Instructions (Addendum)
  [facility]  [address] [place], [place]  Main: [phone] Dr. Remo Lipps Altabet   Generalized Anxiety Disorder Generalized anxiety disorder (GAD) is a mental disorder. It interferes with life functions, including relationships, work, and school. GAD is different from normal anxiety, which everyone experiences at some point in their lives in response to specific life events and activities. Normal anxiety actually helps [place] prepare for and get through these life events and activities. Normal anxiety goes away after the event or activity is over.  GAD causes anxiety that is not necessarily related to specific events or activities. It also causes excess anxiety in proportion to specific events or activities. The anxiety associated with GAD is also difficult to control. GAD can vary from mild to severe. People with severe GAD can have intense waves of anxiety with physical symptoms (panic attacks).  SYMPTOMS The anxiety and worry associated with GAD are difficult to control. This anxiety and worry are related to many life events and activities and also occur more days than not for 6 months or longer. People with GAD also have three or more of the following symptoms (one or more in children):  Restlessness.   Fatigue.  Difficulty concentrating.   Irritability.  Muscle tension.  Difficulty sleeping or unsatisfying sleep. DIAGNOSIS GAD is diagnosed through an assessment by your health care provider. Your health care provider will ask you questions aboutyour mood,physical symptoms, and events in your life. Your health care provider may ask you about your medical history and use of alcohol or drugs, including prescription medicines. Your health care provider may also do a physical exam and blood tests. Certain medical conditions and the use of certain substances can cause symptoms similar to those associated with GAD. Your health  care provider may refer you to a mental health specialist for further evaluation. TREATMENT The following therapies are usually used to treat GAD:   Medication. Antidepressant medication usually is prescribed for long-term daily control. Antianxiety medicines may be added in severe cases, especially when panic attacks occur.   Talk therapy (psychotherapy). Certain types of talk therapy can be helpful in treating GAD by providing support, education, and guidance. A form of talk therapy called cognitive behavioral therapy can teach you healthy ways to think about and react to daily life events and activities.  Stress managementtechniques. These include yoga, meditation, and exercise and can be very helpful when they are practiced regularly. A mental health specialist can help determine which treatment is best for you. Some people see improvement with one therapy. However, other people require a combination of therapies. Document Released: 11/29/2012 Document Revised: 12/19/2013 Document Reviewed: 11/29/2012 [facility] Patient Information 2015 [place], [place]. This information is not intended to replace advice given to you by your health care provider. Make sure you discuss any questions you have with your health care provider.

## 2014-04-18 NOTE — Progress Notes (Signed)
Pre visit review using our clinic review tool, if applicable. No additional management support is needed unless otherwise documented below in the visit note. 

## 2014-04-18 NOTE — Telephone Encounter (Signed)
Pt will need refferal to  [facility]  [address] [place], [place]: [phone]  Dr. Rainey Pines  padona had told pt to make own appt, but they need referral for pt

## 2014-04-19 ENCOUNTER — Encounter: Payer: Self-pay | Admitting: Family

## 2014-04-19 NOTE — Progress Notes (Signed)
Subjective:    Patient ID: Paige [name], female    DOB: 09/17/1950, 63 y.o.   MRN: [id number]  HPI 63 year old female, nonsmoker with a history of depression and anxiety is in today for recheck of medication. She requested to start Strattera at her last office visit and has not worked effectively to help her concentration. She been on multiple medications at the past. Richardo Priest, therapist believes that she is Bipolar Disorder. She has not responded to antidepressants, Lamictal nor Strattera as suggested by Dr. Arnoldo Morale.     Review of Systems  Constitutional: Negative.   HENT: Negative.   Respiratory: Negative.   Cardiovascular: Negative.   Endocrine: Negative.   Genitourinary: Negative.   Musculoskeletal: Negative.   Neurological: Negative.   Psychiatric/Behavioral: Positive for sleep disturbance and agitation. Negative for suicidal ideas. The patient is nervous/anxious.    Past Medical History  Diagnosis Date  . Hypertension   . Thyroid disease   . GERD (gastroesophageal reflux disease)   . Arthritis   . Carpal tunnel syndrome   . Anxiety   . ADD (attention deficit disorder)     Pt has hard time focusing  . Colon polyp   . Depression   . Status post dilation of esophageal narrowing   . Gallstones     History   Social History  . Marital Status: Married    Spouse Name: N/A    Number of Children: 2  . Years of Education: N/A   Occupational History  . retired   . [place] History Main Topics  . Smoking status: Never Smoker   . Smokeless tobacco: Never Used  . Alcohol Use: No  . Drug Use: No  . Sexual Activity: No   Other Topics Concern  . Not on file   Social History Narrative  . No narrative on file    Past Surgical History  Procedure Laterality Date  . Cholecystectomy    . Abdominal hysterectomy    . Tubal ligation    . Fx leg    . Nasal sinus surgery    . Closed reduction hand fracture      right hand  .  Carpal tunnel release Left     Family History  Problem Relation Age of Onset  . Lung cancer Father   . Hyperlipidemia Mother   . Cancer Paternal Grandfather     mets  . Leukemia Maternal Aunt     Allergies  Allergen Reactions  . Asa [Aspirin] Other (See Comments)    SOB and blocks ears and nose  . Morphine And Related     Current Outpatient Prescriptions on File Prior to Visit  Medication Sig Dispense Refill  . bisoprolol-hydrochlorothiazide (ZIAC) 5-6.25 MG per tablet TAKE ONE TABLET BY MOUTH ONCE DAILY  90 tablet  3  . celecoxib (CELEBREX) 200 MG capsule Take 1 capsule (200 mg total) by mouth 2 (two) times daily.  60 capsule  11  . docusate sodium (COLACE) 100 MG capsule Take 100 mg by mouth daily. Equate stool softener-Take one daily      . Hydrocodone-Acetaminophen 7.5-300 MG TABS       . lansoprazole (PREVACID) 30 MG capsule Take 30 mg by mouth daily.        Marland Kitchen levothyroxine (SYNTHROID, LEVOTHROID) 125 MCG tablet Take 1 tablet (125 mcg total) by mouth daily.  90 tablet  3  . methocarbamol (ROBAXIN) 500 MG tablet       .  triamcinolone cream (KENALOG) 0.1 % Apply 1 application topically 2 (two) times daily.  30 g  0   No current facility-administered medications on file prior to visit.    BP 120/72  Pulse 81  Wt 193 lb 3.2 oz (87.635 kg)chart    Objective:   Physical Exam  Constitutional: She is oriented to person, place, and time. She appears well-developed and well-nourished.  HENT:  Right Ear: External ear normal.  Left Ear: External ear normal.  Nose: Nose normal.  Mouth/Throat: Oropharynx is clear and moist.  Neck: Normal range of motion. Neck supple. No thyromegaly present.  Cardiovascular: Normal rate, regular rhythm and normal heart sounds.   Pulmonary/Chest: Effort normal and breath sounds normal.  Musculoskeletal: Normal range of motion.  Neurological: She is alert and oriented to person, place, and time.  Skin: Skin is warm and dry.  Psychiatric: She  has a normal mood and affect.          Assessment & Plan:  Paige [name] was seen today for follow-up.  Diagnoses and associated orders for this visit:  Unspecified hypothyroidism - TSH - CMP  Generalized anxiety disorder - CMP  Unspecified essential hypertension - CMP  Other Orders - Discontinue: diazepam (VALIUM) 10 MG tablet; TAKE ONE-HALF TABLET BY MOUTH EVERY 12 HOURS AS NEEDED - diazepam (VALIUM) 10 MG tablet; TAKE ONE-HALF TABLET BY MOUTH EVERY 12 HOURS AS NEEDED   Call the office with any questions or concerns. Recheck as scheduled and as needed.

## 2014-04-19 NOTE — Telephone Encounter (Signed)
Referral placed.

## 2014-04-20 ENCOUNTER — Other Ambulatory Visit (INDEPENDENT_AMBULATORY_CARE_PROVIDER_SITE_OTHER): Payer: BC Managed Care – PPO

## 2014-04-20 DIAGNOSIS — I10 Essential (primary) hypertension: Secondary | ICD-10-CM

## 2014-04-20 DIAGNOSIS — E039 Hypothyroidism, unspecified: Secondary | ICD-10-CM

## 2014-04-20 LAB — COMPREHENSIVE METABOLIC PANEL
ALK PHOS: 92 U/L (ref 39–117)
ALT: 12 U/L (ref 0–35)
AST: 22 U/L (ref 0–37)
Albumin: 4.1 g/dL (ref 3.5–5.2)
BILIRUBIN TOTAL: 0.6 mg/dL (ref 0.2–1.2)
BUN: 18 mg/dL (ref 6–23)
CO2: 28 mEq/L (ref 19–32)
Calcium: 9.9 mg/dL (ref 8.4–10.5)
Chloride: 103 mEq/L (ref 96–112)
Creatinine, Ser: 0.6 mg/dL (ref 0.4–1.2)
GFR: 115.79 mL/min (ref >60.00)
GLUCOSE: 82 mg/dL (ref 70–99)
Potassium: 3.3 mEq/L — ABNORMAL LOW (ref 3.5–5.1)
SODIUM: 139 meq/L (ref 135–145)
TOTAL PROTEIN: 8.1 g/dL (ref 6.0–8.3)

## 2014-04-20 LAB — TSH: TSH: 0.12 u[IU]/mL — ABNORMAL LOW (ref 0.35–4.50)

## 2014-04-25 ENCOUNTER — Other Ambulatory Visit: Payer: Self-pay | Admitting: Family

## 2014-04-25 MED ORDER — POTASSIUM CHLORIDE ER 10 MEQ PO CPCR: 10.0000 meq | ORAL_CAPSULE | Freq: Two times a day (BID) | ORAL | Status: DC

## 2014-04-25 MED ORDER — LEVOTHYROXINE SODIUM 112 MCG PO TABS: 112.0000 ug | ORAL_TABLET | Freq: Every day | ORAL | Status: DC

## 2014-05-02 NOTE — Telephone Encounter (Signed)
Please advise 

## 2014-05-02 NOTE — Telephone Encounter (Signed)
Pt states dr altabet's office needs to know why pt is wanting to come there and what does she need testing fotr This office only does testing, and want to know what test test pt needs.

## 2014-05-03 NOTE — Telephone Encounter (Signed)
ADD testing

## 2014-05-05 NOTE — Telephone Encounter (Signed)
Pt was unsure that she is suppose to be having ADD testing done with Dr. Lurline Hare. Left message to notify pt to call his office to schedule appointment

## 2014-05-08 ENCOUNTER — Ambulatory Visit: Payer: Self-pay | Admitting: Internal Medicine

## 2014-05-12 ENCOUNTER — Ambulatory Visit: Payer: BC Managed Care – PPO | Admitting: Family

## 2014-05-17 ENCOUNTER — Ambulatory Visit (INDEPENDENT_AMBULATORY_CARE_PROVIDER_SITE_OTHER): Payer: BC Managed Care – PPO | Admitting: Psychology

## 2014-05-17 DIAGNOSIS — F988 Other specified behavioral and emotional disorders with onset usually occurring in childhood and adolescence: Secondary | ICD-10-CM

## 2014-05-17 DIAGNOSIS — F3289 Other specified depressive episodes: Secondary | ICD-10-CM

## 2014-05-17 DIAGNOSIS — F329 Major depressive disorder, single episode, unspecified: Secondary | ICD-10-CM

## 2014-05-19 ENCOUNTER — Ambulatory Visit (INDEPENDENT_AMBULATORY_CARE_PROVIDER_SITE_OTHER): Payer: BC Managed Care – PPO | Admitting: Psychology

## 2014-05-19 DIAGNOSIS — F909 Attention-deficit hyperactivity disorder, unspecified type: Secondary | ICD-10-CM

## 2014-05-19 DIAGNOSIS — F4323 Adjustment disorder with mixed anxiety and depressed mood: Secondary | ICD-10-CM

## 2014-05-30 ENCOUNTER — Ambulatory Visit: Payer: BC Managed Care – PPO | Admitting: Family

## 2014-05-31 ENCOUNTER — Other Ambulatory Visit: Payer: Self-pay | Admitting: Family

## 2014-06-06 ENCOUNTER — Ambulatory Visit: Payer: BC Managed Care – PPO | Admitting: Family

## 2014-06-09 ENCOUNTER — Encounter: Payer: Self-pay | Admitting: Family

## 2014-06-09 ENCOUNTER — Ambulatory Visit (INDEPENDENT_AMBULATORY_CARE_PROVIDER_SITE_OTHER): Payer: BC Managed Care – PPO | Admitting: Family

## 2014-06-09 VITALS — BP 114/62 | HR 96 | Wt 193.5 lb

## 2014-06-09 DIAGNOSIS — F411 Generalized anxiety disorder: Secondary | ICD-10-CM

## 2014-06-09 DIAGNOSIS — E876 Hypokalemia: Secondary | ICD-10-CM

## 2014-06-09 DIAGNOSIS — F32A Depression, unspecified: Secondary | ICD-10-CM | POA: Insufficient documentation

## 2014-06-09 DIAGNOSIS — E039 Hypothyroidism, unspecified: Secondary | ICD-10-CM

## 2014-06-09 DIAGNOSIS — I10 Essential (primary) hypertension: Secondary | ICD-10-CM

## 2014-06-09 DIAGNOSIS — F909 Attention-deficit hyperactivity disorder, unspecified type: Secondary | ICD-10-CM

## 2014-06-09 DIAGNOSIS — F988 Other specified behavioral and emotional disorders with onset usually occurring in childhood and adolescence: Secondary | ICD-10-CM | POA: Insufficient documentation

## 2014-06-09 DIAGNOSIS — F329 Major depressive disorder, single episode, unspecified: Secondary | ICD-10-CM

## 2014-06-09 LAB — BASIC METABOLIC PANEL
BUN: 21 mg/dL (ref 6–23)
CALCIUM: 9.9 mg/dL (ref 8.4–10.5)
CO2: 27 meq/L (ref 19–32)
Chloride: 103 mEq/L (ref 96–112)
Creatinine, Ser: 0.7 mg/dL (ref 0.4–1.2)
GFR: 89.46 mL/min (ref >60.00)
GLUCOSE: 102 mg/dL — AB (ref 70–99)
Potassium: 3.6 mEq/L (ref 3.5–5.1)
Sodium: 138 mEq/L (ref 135–145)

## 2014-06-09 LAB — TSH: TSH: 0.37 u[IU]/mL (ref 0.35–4.50)

## 2014-06-09 MED ORDER — ESCITALOPRAM OXALATE 10 MG PO TABS: 10.0000 mg | ORAL_TABLET | Freq: Every day | ORAL | Status: DC

## 2014-06-09 NOTE — Progress Notes (Signed)
Pre visit review using our clinic review tool, if applicable. No additional management support is needed unless otherwise documented below in the visit note. 

## 2014-06-09 NOTE — Patient Instructions (Signed)

## 2014-06-12 ENCOUNTER — Telehealth: Payer: Self-pay | Admitting: Family

## 2014-06-12 NOTE — Telephone Encounter (Signed)
emmi emailed °

## 2014-06-12 NOTE — Progress Notes (Signed)
Subjective:    Patient ID: Paige [name], female    DOB: [birth year], 63 y.o.   MRN: [id number]  HPI 63 year old female, nonsmoker is in today for a recheck of ADD, GAD, Hypothyroidism, HTN, Depression, and Hypokalemia. Currently doing well. She saw Dr. Lurline Hare who suggest she has depression, anxiety, and ADD.    Review of Systems  Constitutional: Negative.   HENT: Negative.   Respiratory: Negative.   Cardiovascular: Negative.   Gastrointestinal: Negative.   Endocrine: Negative.   Genitourinary: Negative.   Musculoskeletal: Negative.   Skin: Negative.   Allergic/Immunologic: Negative.   Neurological: Negative.   Hematological: Negative.   Psychiatric/Behavioral: Negative.    Past Medical History  Diagnosis Date  . Hypertension   . Thyroid disease   . GERD (gastroesophageal reflux disease)   . Arthritis   . Carpal tunnel syndrome   . Anxiety   . ADD (attention deficit disorder)     Pt has hard time focusing  . Colon polyp   . Depression   . Status post dilation of esophageal narrowing   . Gallstones     History   Social History  . Marital Status: Married    Spouse Name: N/A    Number of Children: 2  . Years of Education: N/A   Occupational History  . retired   . [place] History Main Topics  . Smoking status: Never Smoker   . Smokeless tobacco: Never Used  . Alcohol Use: No  . Drug Use: No  . Sexual Activity: No   Other Topics Concern  . Not on file   Social History Narrative  . No narrative on file    Past Surgical History  Procedure Laterality Date  . Cholecystectomy    . Abdominal hysterectomy    . Tubal ligation    . Fx leg    . Nasal sinus surgery    . Closed reduction hand fracture      right hand  . Carpal tunnel release Left     Family History  Problem Relation Age of Onset  . Lung cancer Father   . Hyperlipidemia Mother   . Cancer Paternal Grandfather     mets  . Leukemia Maternal Aunt       Allergies  Allergen Reactions  . Asa [Aspirin] Other (See Comments)    SOB and blocks ears and nose  . Morphine And Related     Current Outpatient Prescriptions on File Prior to Visit  Medication Sig Dispense Refill  . bisoprolol-hydrochlorothiazide (ZIAC) 5-6.25 MG per tablet TAKE ONE TABLET BY MOUTH ONCE DAILY  90 tablet  3  . celecoxib (CELEBREX) 200 MG capsule Take 1 capsule (200 mg total) by mouth 2 (two) times daily.  60 capsule  11  . diazepam (VALIUM) 10 MG tablet TAKE ONE-HALF TABLET BY MOUTH EVERY 12 HOURS AS NEEDED  30 tablet  2  . docusate sodium (COLACE) 100 MG capsule Take 100 mg by mouth daily. Equate stool softener-Take one daily      . Hydrocodone-Acetaminophen 7.5-300 MG TABS       . lansoprazole (PREVACID) 30 MG capsule Take 30 mg by mouth daily.        Marland Kitchen levothyroxine (SYNTHROID, LEVOTHROID) 112 MCG tablet Take 1 tablet (112 mcg total) by mouth daily.  30 tablet  3  . methocarbamol (ROBAXIN) 500 MG tablet       . potassium chloride (MICRO-K) 10 MEQ CR capsule TAKE  ONE CAPSULE BY MOUTH TWICE DAILY  60 capsule  1  . triamcinolone cream (KENALOG) 0.1 % Apply 1 application topically 2 (two) times daily.  30 g  0   No current facility-administered medications on file prior to visit.    BP 114/62  Pulse 96  Wt 193 lb 8 oz (87.771 kg)chart    Objective:   Physical Exam  Constitutional: She is oriented to person, place, and time. She appears well-developed and well-nourished.  HENT:  Right Ear: External ear normal.  Left Ear: External ear normal.  Nose: Nose normal.  Mouth/Throat: Oropharynx is clear and moist.  Neck: Normal range of motion. Neck supple.  Cardiovascular: Normal rate, regular rhythm and normal heart sounds.   Pulmonary/Chest: Effort normal and breath sounds normal.  Abdominal: Soft. Bowel sounds are normal.  Musculoskeletal: Normal range of motion.  Neurological: She is alert and oriented to person, place, and time.  Skin: Skin is warm and  dry.          Assessment & Plan:  Paige [name] was seen today for adhd.  Diagnoses and associated orders for this visit:  Depression  ADD (attention deficit disorder)  Generalized anxiety disorder  Essential hypertension, benign  Hypothyroidism, unspecified hypothyroidism type - TSH  Hypokalemia - Basic Metabolic Panel  Other Orders - escitalopram (LEXAPRO) 10 MG tablet; Take 1 tablet (10 mg total) by mouth daily.   Call the office with any questions or concerns. Start Lexapro 1st then consider medication for ADd once she is stable

## 2014-06-19 ENCOUNTER — Encounter: Payer: Self-pay | Admitting: Family

## 2014-06-27 ENCOUNTER — Ambulatory Visit (INDEPENDENT_AMBULATORY_CARE_PROVIDER_SITE_OTHER): Payer: BC Managed Care – PPO | Admitting: Family

## 2014-06-27 ENCOUNTER — Encounter: Payer: Self-pay | Admitting: Family

## 2014-06-27 VITALS — BP 104/62 | HR 95 | Wt 194.9 lb

## 2014-06-27 DIAGNOSIS — I10 Essential (primary) hypertension: Secondary | ICD-10-CM

## 2014-06-27 DIAGNOSIS — F329 Major depressive disorder, single episode, unspecified: Secondary | ICD-10-CM

## 2014-06-27 DIAGNOSIS — F909 Attention-deficit hyperactivity disorder, unspecified type: Secondary | ICD-10-CM

## 2014-06-27 DIAGNOSIS — F32A Depression, unspecified: Secondary | ICD-10-CM

## 2014-06-27 DIAGNOSIS — F988 Other specified behavioral and emotional disorders with onset usually occurring in childhood and adolescence: Secondary | ICD-10-CM

## 2014-06-27 MED ORDER — AMPHETAMINE-DEXTROAMPHETAMINE 10 MG PO TABS: 10.0000 mg | ORAL_TABLET | Freq: Two times a day (BID) | ORAL | Status: DC

## 2014-06-27 NOTE — Progress Notes (Signed)
Subjective:    Patient ID: Paige [name], female    DOB: [birth year], 63 y.o.   MRN: [id number]  HPI 63 year old white female, nonsmoker with a history of anxiety and depression currently on Lexapro 10 mg once daily and tolerating it well was in today for recheck. She was seen by psychologist, Dr. Lurline Hare who suggested that she has attention deficit disorder as well. In the past, she's been on Adderall that worked well.continues to have difficulty concentrating, reports being more calm but continues to be agitated.   Review of Systems  Constitutional: Positive for fatigue. Negative for fever and chills.  HENT: Negative.   Respiratory: Negative.   Cardiovascular: Negative.   Gastrointestinal: Negative.   Endocrine: Negative.   Genitourinary: Negative.   Musculoskeletal: Negative.   Skin: Negative.   Neurological: Negative.   Psychiatric/Behavioral: Positive for decreased concentration and agitation.   Past Medical History  Diagnosis Date  . Hypertension   . Thyroid disease   . GERD (gastroesophageal reflux disease)   . Arthritis   . Carpal tunnel syndrome   . Anxiety   . ADD (attention deficit disorder)     Pt has hard time focusing  . Colon polyp   . Depression   . Status post dilation of esophageal narrowing   . Gallstones     History   Social History  . Marital Status: Married    Spouse Name: N/A    Number of Children: 2  . Years of Education: N/A   Occupational History  . retired   . [place] History Main Topics  . Smoking status: Never Smoker   . Smokeless tobacco: Never Used  . Alcohol Use: No  . Drug Use: No  . Sexual Activity: No   Other Topics Concern  . Not on file   Social History Narrative    Past Surgical History  Procedure Laterality Date  . Cholecystectomy    . Abdominal hysterectomy    . Tubal ligation    . Fx leg    . Nasal sinus surgery    . Closed reduction hand fracture      right hand  .  Carpal tunnel release Left     Family History  Problem Relation Age of Onset  . Lung cancer Father   . Hyperlipidemia Mother   . Cancer Paternal Grandfather     mets  . Leukemia Maternal Aunt     Allergies  Allergen Reactions  . Asa [Aspirin] Other (See Comments)    SOB and blocks ears and nose  . Morphine And Related     Current Outpatient Prescriptions on File Prior to Visit  Medication Sig Dispense Refill  . bisoprolol-hydrochlorothiazide (ZIAC) 5-6.25 MG per tablet TAKE ONE TABLET BY MOUTH ONCE DAILY 90 tablet 3  . docusate sodium (COLACE) 100 MG capsule Take 100 mg by mouth daily. Equate stool softener-Take one daily    . escitalopram (LEXAPRO) 10 MG tablet Take 1 tablet (10 mg total) by mouth daily. 30 tablet 2  . lansoprazole (PREVACID) 30 MG capsule Take 30 mg by mouth daily.      Marland Kitchen levothyroxine (SYNTHROID, LEVOTHROID) 112 MCG tablet Take 1 tablet (112 mcg total) by mouth daily. 30 tablet 3  . potassium chloride (MICRO-K) 10 MEQ CR capsule TAKE ONE CAPSULE BY MOUTH TWICE DAILY 60 capsule 1   No current facility-administered medications on file prior to visit.    BP 104/62 mmHg  Pulse 95  Wt  194 lb 14.4 oz (88.406 kg)chart    Objective:   Physical Exam  Constitutional: She is oriented to person, place, and time. She appears well-developed and well-nourished.  HENT:  Right Ear: External ear normal.  Left Ear: External ear normal.  Nose: Nose normal.  Mouth/Throat: Oropharynx is clear and moist.  Neck: Normal range of motion. Neck supple.  Cardiovascular: Normal rate, regular rhythm and normal heart sounds.   Pulmonary/Chest: Effort normal and breath sounds normal.  Abdominal: Soft. Bowel sounds are normal.  Musculoskeletal: Normal range of motion.  Neurological: She is alert and oriented to person, place, and time.  Skin: Skin is warm and dry.  Psychiatric: She has a normal mood and affect.          Assessment & Plan:  1. Attention deficit disorder 2.  Depression 3. Anxiety  We'll try Adderall10 mg twice a day. Continue Lexapro 10 mg once daily. Encouraged healthy diet and exercise. We'll bring back for recheck in 4 weeks and sooner as needed.

## 2014-06-27 NOTE — Patient Instructions (Signed)
Exercise to Stay Healthy Exercise helps you become and stay healthy. EXERCISE IDEAS AND TIPS Choose exercises that:  You enjoy.  Fit into your day. You do not need to exercise really hard to be healthy. You can do exercises at a slow or medium level and stay healthy. You can:  Stretch before and after working out.  Try yoga, Pilates, or tai chi.  Lift weights.  Walk fast, swim, jog, run, climb stairs, bicycle, dance, or rollerskate.  Take aerobic classes. Exercises that burn about 150 calories:  Running 1  miles in 15 minutes.  Playing volleyball for 45 to 60 minutes.  Washing and waxing a car for 45 to 60 minutes.  Playing touch football for 45 minutes.  Walking 1  miles in 35 minutes.  Pushing a stroller 1  miles in 30 minutes.  Playing basketball for 30 minutes.  Raking leaves for 30 minutes.  Bicycling 5 miles in 30 minutes.  Walking 2 miles in 30 minutes.  Dancing for 30 minutes.  Shoveling snow for 15 minutes.  Swimming laps for 20 minutes.  Walking up stairs for 15 minutes.  Bicycling 4 miles in 15 minutes.  Gardening for 30 to 45 minutes.  Jumping rope for 15 minutes.  Washing windows or floors for 45 to 60 minutes. Document Released: 09/06/2010 Document Revised: 10/27/2011 Document Reviewed: 09/06/2010 [employer] Patient Information 2015 [employer]. This information is not intended to replace advice given to you by your health care provider. Make sure you discuss any questions you have with your health care provider.  

## 2014-06-27 NOTE — Progress Notes (Signed)
Pre visit review using our clinic review tool, if applicable. No additional management support is needed unless otherwise documented below in the visit note. 

## 2014-08-01 ENCOUNTER — Ambulatory Visit (INDEPENDENT_AMBULATORY_CARE_PROVIDER_SITE_OTHER): Payer: BC Managed Care – PPO | Admitting: Family

## 2014-08-01 ENCOUNTER — Encounter: Payer: Self-pay | Admitting: Family

## 2014-08-01 VITALS — BP 120/80 | HR 83 | Temp 98.2°F | Wt 179.0 lb

## 2014-08-01 DIAGNOSIS — F988 Other specified behavioral and emotional disorders with onset usually occurring in childhood and adolescence: Secondary | ICD-10-CM

## 2014-08-01 DIAGNOSIS — F909 Attention-deficit hyperactivity disorder, unspecified type: Secondary | ICD-10-CM

## 2014-08-01 DIAGNOSIS — F32A Depression, unspecified: Secondary | ICD-10-CM

## 2014-08-01 DIAGNOSIS — F329 Major depressive disorder, single episode, unspecified: Secondary | ICD-10-CM

## 2014-08-01 MED ORDER — AMPHETAMINE-DEXTROAMPHET ER 20 MG PO CP24: 20.0000 mg | ORAL_CAPSULE | ORAL | Status: DC

## 2014-08-01 NOTE — Progress Notes (Signed)
Pre visit review using our clinic review tool, if applicable. No additional management support is needed unless otherwise documented below in the visit note. 

## 2014-08-01 NOTE — Patient Instructions (Signed)
1. Call me in 3 weeks and let me know if you like the extended release Adderall better than the immediate release.

## 2014-08-03 NOTE — Progress Notes (Signed)
Subjective:    Patient ID: Paige [name], female    DOB: 16-Apr-1951, 63 y.o.   MRN: [id number]  HPI  63 year old white female is in today for a recheck of Depression, Anxiety, and ADD. She reports is doing better. Currently stable. But reports often forgetting her second dose of Adderall. Would like to consider Adderall XR that Dr. Arnoldo Morale had her on at [name]. Mood is better and concentration is improved.   Review of Systems  Constitutional: Negative.   Respiratory: Negative.   Cardiovascular: Negative.   Gastrointestinal: Negative.   Endocrine: Negative.   Genitourinary: Negative.   Musculoskeletal: Negative.   Skin: Negative.   Allergic/Immunologic: Negative.   Neurological: Negative.   Hematological: Negative.   Psychiatric/Behavioral: Negative.    Past Medical History  Diagnosis Date  . Hypertension   . Thyroid disease   . GERD (gastroesophageal reflux disease)   . Arthritis   . Carpal tunnel syndrome   . Anxiety   . ADD (attention deficit disorder)     Pt has hard time focusing  . Colon polyp   . Depression   . Status post dilation of esophageal narrowing   . Gallstones     History   Social History  . Marital Status: Married    Spouse Name: N/A    Number of Children: 2  . Years of Education: N/A   Occupational History  . retired   . [place] History Main Topics  . Smoking status: Never Smoker   . Smokeless tobacco: Never Used  . Alcohol Use: No  . Drug Use: No  . Sexual Activity: No   Other Topics Concern  . Not on file   Social History Narrative    Past Surgical History  Procedure Laterality Date  . Cholecystectomy    . Abdominal hysterectomy    . Tubal ligation    . Fx leg    . Nasal sinus surgery    . Closed reduction hand fracture      right hand  . Carpal tunnel release Left     Family History  Problem Relation Age of Onset  . Lung cancer Father   . Hyperlipidemia Mother   . Cancer  Paternal Grandfather     mets  . Leukemia Maternal Aunt     Allergies  Allergen Reactions  . Asa [Aspirin] Other (See Comments)    SOB and blocks ears and nose  . Morphine And Related     Current Outpatient Prescriptions on File Prior to Visit  Medication Sig Dispense Refill  . bisoprolol-hydrochlorothiazide (ZIAC) 5-6.25 MG per tablet TAKE ONE TABLET BY MOUTH ONCE DAILY 90 tablet 3  . docusate sodium (COLACE) 100 MG capsule Take 100 mg by mouth daily. Equate stool softener-Take one daily    . escitalopram (LEXAPRO) 10 MG tablet Take 1 tablet (10 mg total) by mouth daily. 30 tablet 2  . lansoprazole (PREVACID) 30 MG capsule Take 30 mg by mouth daily.      Marland Kitchen levothyroxine (SYNTHROID, LEVOTHROID) 112 MCG tablet Take 1 tablet (112 mcg total) by mouth daily. 30 tablet 3  . potassium chloride (MICRO-K) 10 MEQ CR capsule TAKE ONE CAPSULE BY MOUTH TWICE DAILY 60 capsule 1   No current facility-administered medications on file prior to visit.    BP 120/80 mmHg  Pulse 83  Temp(Src) 98.2 F (36.8 C) (Oral)  Wt 179 lb (81.194 kg)chart    Objective:   Physical  Exam  Constitutional: She is oriented to person, place, and time. She appears well-developed and well-nourished.  Neck: Normal range of motion. Neck supple.  Cardiovascular: Normal rate, regular rhythm and normal heart sounds.   Pulmonary/Chest: Effort normal and breath sounds normal.  Abdominal: Soft. Bowel sounds are normal.  Musculoskeletal: Normal range of motion.  Neurological: She is alert and oriented to person, place, and time.  Skin: Skin is warm and dry.  Psychiatric: She has a normal mood and affect.          Assessment & Plan:  Paige [name] was seen today for follow-up.  Diagnoses and associated orders for this visit:  ADD (attention deficit disorder)  Depression  Other Orders - amphetamine-dextroamphetamine (ADDERALL XR) 20 MG 24 hr capsule; Take 1 capsule (20 mg total) by mouth every morning.   Call the  office with any questions or concerns. Recheck in 4 weeks and sooner as needed.We will see how she does on XR instead of immediate release.

## 2014-08-14 ENCOUNTER — Other Ambulatory Visit: Payer: Self-pay | Admitting: Family

## 2014-08-15 ENCOUNTER — Other Ambulatory Visit: Payer: Self-pay | Admitting: Family

## 2014-08-29 ENCOUNTER — Ambulatory Visit (INDEPENDENT_AMBULATORY_CARE_PROVIDER_SITE_OTHER): Payer: 59 | Admitting: Family

## 2014-08-29 ENCOUNTER — Encounter: Payer: Self-pay | Admitting: Family

## 2014-08-29 VITALS — BP 124/74 | HR 72 | Temp 98.1°F | Ht 61.0 in | Wt 197.0 lb

## 2014-08-29 DIAGNOSIS — F909 Attention-deficit hyperactivity disorder, unspecified type: Secondary | ICD-10-CM

## 2014-08-29 DIAGNOSIS — F329 Major depressive disorder, single episode, unspecified: Secondary | ICD-10-CM

## 2014-08-29 DIAGNOSIS — F988 Other specified behavioral and emotional disorders with onset usually occurring in childhood and adolescence: Secondary | ICD-10-CM

## 2014-08-29 DIAGNOSIS — F32A Depression, unspecified: Secondary | ICD-10-CM

## 2014-08-29 MED ORDER — ESCITALOPRAM OXALATE 10 MG PO TABS: 10.0000 mg | ORAL_TABLET | Freq: Every day | ORAL | Status: DC

## 2014-08-29 MED ORDER — AMPHETAMINE-DEXTROAMPHET ER 30 MG PO CP24: 30.0000 mg | ORAL_CAPSULE | ORAL | Status: DC

## 2014-08-29 NOTE — Patient Instructions (Signed)
Exercise to Lose Weight Exercise and a healthy diet may help you lose weight. Your doctor may suggest specific exercises. EXERCISE IDEAS AND TIPS  Choose low-cost things you enjoy doing, such as walking, bicycling, or exercising to workout videos.  Take stairs instead of the elevator.  Walk during your lunch break.  Park your car further away from work or school.  Go to a gym or an exercise class.  Start with 5 to 10 minutes of exercise each day. Build up to 30 minutes of exercise 4 to 6 days a week.  Wear shoes with good support and comfortable clothes.  Stretch before and after working out.  Work out until you breathe harder and your heart beats faster.  Drink extra water when you exercise.  Do not do so much that you hurt yourself, feel dizzy, or get very short of breath. Exercises that burn about 150 calories:  Running 1  miles in 15 minutes.  Playing volleyball for 45 to 60 minutes.  Washing and waxing a car for 45 to 60 minutes.  Playing touch football for 45 minutes.  Walking 1  miles in 35 minutes.  Pushing a stroller 1  miles in 30 minutes.  Playing basketball for 30 minutes.  Raking leaves for 30 minutes.  Bicycling 5 miles in 30 minutes.  Walking 2 miles in 30 minutes.  Dancing for 30 minutes.  Shoveling snow for 15 minutes.  Swimming laps for 20 minutes.  Walking up stairs for 15 minutes.  Bicycling 4 miles in 15 minutes.  Gardening for 30 to 45 minutes.  Jumping rope for 15 minutes.  Washing windows or floors for 45 to 60 minutes. Document Released: 09/06/2010 Document Revised: 10/27/2011 Document Reviewed: 09/06/2010 Exit[employer] Patient Information 2015 [employer]. This information is not intended to replace advice given to you by your health care provider. Make sure you discuss any questions you have with your health care provider.  

## 2014-08-29 NOTE — Progress Notes (Signed)
Pre visit review using our clinic review tool, if applicable. No additional management support is needed unless otherwise documented below in the visit note. 

## 2014-08-29 NOTE — Progress Notes (Signed)
Subjective:    Patient ID: Paige [name], female    DOB: [birth year], 64 y.o.   MRN: [id number]  HPI 64 year old white female, nonsmoker with a history of depression, attention deficit disorder, hypothyroidism is in today for recheck of attention deficit disorder. She's currently on Adderall XR 20 mg that we initiated last month because she will often forgot to take the second dose of Adderall immediate release. Reports she is unsure if the medication is working well for her. Still has difficulty concentrating and forgetting things. Evaluation by Dr. Janelle Floor was performed and suggested attention deficit disorder and depression. Currently on Lexapro and stable.   Review of Systems  Constitutional: Negative.   HENT: Negative.   Respiratory: Negative.   Cardiovascular: Negative.   Endocrine: Negative.   Genitourinary: Negative.   Psychiatric/Behavioral: Negative.   All other systems reviewed and are negative.  Past Medical History  Diagnosis Date  . Hypertension   . Thyroid disease   . GERD (gastroesophageal reflux disease)   . Arthritis   . Carpal tunnel syndrome   . Anxiety   . ADD (attention deficit disorder)     Pt has hard time focusing  . Colon polyp   . Depression   . Status post dilation of esophageal narrowing   . Gallstones     History   Social History  . Marital Status: Married    Spouse Name: N/A    Number of Children: 2  . Years of Education: N/A   Occupational History  . retired   . [place] History Main Topics  . Smoking status: Never Smoker   . Smokeless tobacco: Never Used  . Alcohol Use: No  . Drug Use: No  . Sexual Activity: No   Other Topics Concern  . Not on file   Social History Narrative    Past Surgical History  Procedure Laterality Date  . Cholecystectomy    . Abdominal hysterectomy    . Tubal ligation    . Fx leg    . Nasal sinus surgery    . Closed reduction hand fracture     right hand  . Carpal tunnel release Left     Family History  Problem Relation Age of Onset  . Lung cancer Father   . Hyperlipidemia Mother   . Cancer Paternal Grandfather     mets  . Leukemia Maternal Aunt     Allergies  Allergen Reactions  . Asa [Aspirin] Other (See Comments)    SOB and blocks ears and nose  . Morphine And Related     Current Outpatient Prescriptions on File Prior to Visit  Medication Sig Dispense Refill  . bisoprolol-hydrochlorothiazide (ZIAC) 5-6.25 MG per tablet TAKE ONE TABLET BY MOUTH ONCE DAILY 90 tablet 3  . docusate sodium (COLACE) 100 MG capsule Take 100 mg by mouth daily. Equate stool softener-Take one daily    . lansoprazole (PREVACID) 30 MG capsule Take 30 mg by mouth daily.      Marland Kitchen levothyroxine (SYNTHROID, LEVOTHROID) 112 MCG tablet TAKE ONE TABLET BY MOUTH ONCE DAILY 90 tablet 0  . potassium chloride (MICRO-K) 10 MEQ CR capsule TAKE ONE CAPSULE BY MOUTH TWICE DAILY 60 capsule 3   No current facility-administered medications on file prior to visit.    BP 124/74 mmHg  Pulse 72  Temp(Src) 98.1 F (36.7 C) (Oral)  Ht 5\' 1"  (1.549 m)  Wt 197 lb (89.359 kg)  BMI 37.24 kg/m2chart  Objective:   Physical Exam  Constitutional: She is oriented to person, place, and time. She appears well-developed and well-nourished.  Weight up 18 pounds, possible error on previous weight?  Neck: Normal range of motion. Neck supple. No thyromegaly present.  Cardiovascular: Normal rate, regular rhythm and normal heart sounds.   Pulmonary/Chest: Effort normal and breath sounds normal.  Abdominal: Soft. Bowel sounds are normal.  Musculoskeletal: Normal range of motion.  Neurological: She is alert and oriented to person, place, and time.  Skin: Skin is warm and dry.  Psychiatric: She has a normal mood and affect.          Assessment & Plan:  Timmya was seen today for follow-up.  Diagnoses and associated orders for this visit:  ADD (attention deficit  disorder)  Depression  Other Orders - amphetamine-dextroamphetamine (ADDERALL XR) 30 MG 24 hr capsule; Take 1 capsule (30 mg total) by mouth every morning. - escitalopram (LEXAPRO) 10 MG tablet; Take 1 tablet (10 mg total) by mouth daily.    Weight today suggest she's gained 18 pounds which is doubtful. She typically ranges in the 190s upon review of her records. Last weight was recorded 179. She feels as if she's gained some weight but not 18 pounds. Increase Adderall to 30 mg extended release once daily. Continue Lexapro. Follow-up in 4 weeks and sooner as needed.

## 2014-09-20 ENCOUNTER — Encounter: Payer: Self-pay | Admitting: Family

## 2014-09-20 ENCOUNTER — Ambulatory Visit (INDEPENDENT_AMBULATORY_CARE_PROVIDER_SITE_OTHER): Payer: 59 | Admitting: Family

## 2014-09-20 VITALS — BP 120/80 | HR 88 | Temp 98.2°F | Wt 196.8 lb

## 2014-09-20 DIAGNOSIS — F988 Other specified behavioral and emotional disorders with onset usually occurring in childhood and adolescence: Secondary | ICD-10-CM

## 2014-09-20 DIAGNOSIS — F411 Generalized anxiety disorder: Secondary | ICD-10-CM

## 2014-09-20 DIAGNOSIS — F32A Depression, unspecified: Secondary | ICD-10-CM

## 2014-09-20 DIAGNOSIS — F909 Attention-deficit hyperactivity disorder, unspecified type: Secondary | ICD-10-CM

## 2014-09-20 DIAGNOSIS — E876 Hypokalemia: Secondary | ICD-10-CM

## 2014-09-20 DIAGNOSIS — F329 Major depressive disorder, single episode, unspecified: Secondary | ICD-10-CM

## 2014-09-20 MED ORDER — POTASSIUM CHLORIDE ER 10 MEQ PO CPCR: 10.0000 meq | ORAL_CAPSULE | Freq: Two times a day (BID) | ORAL | Status: DC

## 2014-09-20 MED ORDER — ESCITALOPRAM OXALATE 20 MG PO TABS: 20.0000 mg | ORAL_TABLET | Freq: Every day | ORAL | Status: DC

## 2014-09-20 MED ORDER — AMPHETAMINE-DEXTROAMPHET ER 30 MG PO CP24: 30.0000 mg | ORAL_CAPSULE | ORAL | Status: DC

## 2014-09-20 NOTE — Progress Notes (Signed)
Subjective:    Patient ID: JAYLANIE BOSCHEE, female    DOB: 1951/08/04, 64 y.o.   MRN: [id number]  HPI  64 year old white female, nonsmoker with a history of hypothyroidism, hypertension, depression, anxiety, and attention deficit disorder is in today for recheck. She was diagnosed with attention deficit disorder and depression through [place]. She is currently on Lexapro 10 mg once daily and tolerating it well. Takes Adderall 30 mg extended release once daily. Periodically still has episodes of feeling down and depressed. Denies any thoughts of death or dying.  Review of Systems  Constitutional: Negative.   HENT: Negative.   Respiratory: Negative.   Cardiovascular: Negative.   Gastrointestinal: Negative.   Endocrine: Negative.   Genitourinary: Negative.   Musculoskeletal: Negative.   Skin: Negative.   Neurological: Negative.   Hematological: Negative.   Psychiatric/Behavioral: Positive for sleep disturbance. The patient is nervous/anxious.    Past Medical History  Diagnosis Date  . Hypertension   . Thyroid disease   . GERD (gastroesophageal reflux disease)   . Arthritis   . Carpal tunnel syndrome   . Anxiety   . ADD (attention deficit disorder)     Pt has hard time focusing  . Colon polyp   . Depression   . Status post dilation of esophageal narrowing   . Gallstones     History   Social History  . Marital Status: Married    Spouse Name: N/A    Number of Children: 2  . Years of Education: N/A   Occupational History  . retired   . [place] History Main Topics  . Smoking status: Never Smoker   . Smokeless tobacco: Never Used  . Alcohol Use: No  . Drug Use: No  . Sexual Activity: No   Other Topics Concern  . Not on file   Social History Narrative    Past Surgical History  Procedure Laterality Date  . Cholecystectomy    . Abdominal hysterectomy    . Tubal ligation    . Fx leg    . Nasal sinus surgery    .  Closed reduction hand fracture      right hand  . Carpal tunnel release Left     Family History  Problem Relation Age of Onset  . Lung cancer Father   . Hyperlipidemia Mother   . Cancer Paternal Grandfather     mets  . Leukemia Maternal Aunt     Allergies  Allergen Reactions  . Asa [Aspirin] Other (See Comments)    SOB and blocks ears and nose  . Morphine And Related     Current Outpatient Prescriptions on File Prior to Visit  Medication Sig Dispense Refill  . bisoprolol-hydrochlorothiazide (ZIAC) 5-6.25 MG per tablet TAKE ONE TABLET BY MOUTH ONCE DAILY 90 tablet 3  . docusate sodium (COLACE) 100 MG capsule Take 100 mg by mouth daily. Equate stool softener-Take one daily    . lansoprazole (PREVACID) 30 MG capsule Take 30 mg by mouth daily.      Marland Kitchen levothyroxine (SYNTHROID, LEVOTHROID) 112 MCG tablet TAKE ONE TABLET BY MOUTH ONCE DAILY 90 tablet 0   No current facility-administered medications on file prior to visit.    BP 120/80 mmHg  Pulse 88  Temp(Src) 98.2 F (36.8 C) (Oral)  Wt 196 lb 12.8 oz (89.268 kg)chart    Objective:   Physical Exam  Constitutional: She is oriented to person, place, and time. She appears well-developed and well-nourished.  HENT:  Right Ear: External ear normal.  Left Ear: External ear normal.  Nose: Nose normal.  Mouth/Throat: Oropharynx is clear and moist.  Neck: Normal range of motion. Neck supple. No thyromegaly present.  Cardiovascular: Normal rate, regular rhythm and normal heart sounds.   Pulmonary/Chest: Effort normal and breath sounds normal.  Musculoskeletal: Normal range of motion.  Neurological: She is alert and oriented to person, place, and time.  Skin: Skin is warm and dry.  Psychiatric: She has a normal mood and affect.         Assessment & Plan:  Tanee was seen today for follow-up.  Diagnoses and associated orders for this visit:  ADD (attention deficit disorder)  Generalized anxiety  disorder  Depression - TSH  Hypokalemia - Basic Metabolic Panel  Other Orders - escitalopram (LEXAPRO) 20 MG tablet; Take 1 tablet (20 mg total) by mouth daily. - amphetamine-dextroamphetamine (ADDERALL XR) 30 MG 24 hr capsule; Take 1 capsule (30 mg total) by mouth every morning. - potassium chloride (MICRO-K) 10 MEQ CR capsule; Take 1 capsule (10 mEq total) by mouth 2 (two) times daily.    Patient has been very difficult to stabilize. Part of the problem is she has difficulty expressing how she feels and if medication is working effectively. Often times, the question has to be asked 2-3 different ways for her to understand. I suspect, and there is an underlying possible mild mental retardation. Labs obtained today will notify patient pending results. I have asked her to consult with psychiatry for further management.

## 2014-09-20 NOTE — Patient Instructions (Signed)
Generalized Anxiety Disorder Generalized anxiety disorder (GAD) is a mental disorder. It interferes with life functions, including relationships, work, and school. GAD is different from normal anxiety, which everyone experiences at some point in their lives in response to specific life events and activities. Normal anxiety actually helps us prepare for and get through these life events and activities. Normal anxiety goes away after the event or activity is over.  GAD causes anxiety that is not necessarily related to specific events or activities. It also causes excess anxiety in proportion to specific events or activities. The anxiety associated with GAD is also difficult to control. GAD can vary from mild to severe. People with severe GAD can have intense waves of anxiety with physical symptoms (panic attacks).  SYMPTOMS The anxiety and worry associated with GAD are difficult to control. This anxiety and worry are related to many life events and activities and also occur more days than not for 6 months or longer. People with GAD also have three or more of the following symptoms (one or more in children):  Restlessness.   Fatigue.  Difficulty concentrating.   Irritability.  Muscle tension.  Difficulty sleeping or unsatisfying sleep. DIAGNOSIS GAD is diagnosed through an assessment by your health care provider. Your health care provider will ask you questions aboutyour mood,physical symptoms, and events in your life. Your health care provider may ask you about your medical history and use of alcohol or drugs, including prescription medicines. Your health care provider may also do a physical exam and blood tests. Certain medical conditions and the use of certain substances can cause symptoms similar to those associated with GAD. Your health care provider may refer you to a mental health specialist for further evaluation. TREATMENT The following therapies are usually used to treat GAD:    Medication. Antidepressant medication usually is prescribed for long-term daily control. Antianxiety medicines may be added in severe cases, especially when panic attacks occur.   Talk therapy (psychotherapy). Certain types of talk therapy can be helpful in treating GAD by providing support, education, and guidance. A form of talk therapy called cognitive behavioral therapy can teach you healthy ways to think about and react to daily life events and activities.  Stress managementtechniques. These include yoga, meditation, and exercise and can be very helpful when they are practiced regularly. A mental health specialist can help determine which treatment is best for you. Some people see improvement with one therapy. However, other people require a combination of therapies. Document Released: 11/29/2012 Document Revised: 12/19/2013 Document Reviewed: 11/29/2012 [employer] Patient Information 2015 [employer]. This information is not intended to replace advice given to you by your health care provider. Make sure you discuss any questions you have with your health care provider.  

## 2014-09-20 NOTE — Progress Notes (Signed)
Pre visit review using our clinic review tool, if applicable. No additional management support is needed unless otherwise documented below in the visit note. 

## 2014-09-21 LAB — BASIC METABOLIC PANEL
BUN: 25 mg/dL — AB (ref 6–23)
CHLORIDE: 102 meq/L (ref 96–112)
CO2: 26 mEq/L (ref 19–32)
CREATININE: 0.65 mg/dL (ref 0.40–1.20)
Calcium: 10 mg/dL (ref 8.4–10.5)
GFR: 97.36 mL/min (ref >60.00)
Glucose, Bld: 99 mg/dL (ref 70–99)
POTASSIUM: 3.9 meq/L (ref 3.5–5.1)
Sodium: 137 mEq/L (ref 135–145)

## 2014-09-21 LAB — TSH: TSH: 0.82 u[IU]/mL (ref 0.35–4.50)

## 2014-10-19 ENCOUNTER — Telehealth: Payer: Self-pay | Admitting: Family

## 2014-10-19 NOTE — Telephone Encounter (Signed)
PA for dextro-amphetamine was denied.  Patient's plan prefers BRAND Adderall XR.

## 2014-10-26 MED ORDER — ADDERALL XR 30 MG PO CP24: 30.0000 mg | ORAL_CAPSULE | Freq: Every day | ORAL | Status: DC

## 2014-10-26 NOTE — Telephone Encounter (Signed)
Ok per [place] to change to Brand Adderall XR for 3 months.

## 2014-10-26 NOTE — Telephone Encounter (Signed)
Left message on voicemail, notifying pt rx is ready for pick up.

## 2014-11-01 NOTE — Telephone Encounter (Signed)
Patient called back and stated the pharmacy switched the medication to [facility] and she was able to fill it.  She doesn't need the scripts left upfront.  I will give it back to [facility] to discard.

## 2014-11-14 ENCOUNTER — Other Ambulatory Visit: Payer: Self-pay | Admitting: Family Medicine

## 2014-11-14 ENCOUNTER — Other Ambulatory Visit: Payer: Self-pay | Admitting: Family

## 2014-11-14 MED ORDER — BISOPROLOL-HYDROCHLOROTHIAZIDE 5-6.25 MG PO TABS: ORAL_TABLET | ORAL | Status: DC

## 2014-11-14 NOTE — Telephone Encounter (Signed)
Refill request for Ziac and send to [facility], also a 90 day supply. I did send script e-scribe.

## 2014-11-15 ENCOUNTER — Ambulatory Visit (INDEPENDENT_AMBULATORY_CARE_PROVIDER_SITE_OTHER): Payer: 59 | Admitting: Family

## 2014-11-15 ENCOUNTER — Encounter: Payer: Self-pay | Admitting: Family

## 2014-11-15 VITALS — BP 108/78 | HR 94 | Temp 98.3°F | Wt 199.8 lb

## 2014-11-15 DIAGNOSIS — K219 Gastro-esophageal reflux disease without esophagitis: Secondary | ICD-10-CM

## 2014-11-15 DIAGNOSIS — I10 Essential (primary) hypertension: Secondary | ICD-10-CM

## 2014-11-15 MED ORDER — BISOPROLOL-HYDROCHLOROTHIAZIDE 5-6.25 MG PO TABS: ORAL_TABLET | ORAL | Status: DC

## 2014-11-15 MED ORDER — LEVOTHYROXINE SODIUM 112 MCG PO TABS: 112.0000 ug | ORAL_TABLET | Freq: Every day | ORAL | Status: DC

## 2014-11-15 NOTE — Progress Notes (Signed)
Pre visit review using our clinic review tool, if applicable. No additional management support is needed unless otherwise documented below in the visit note. 

## 2014-11-15 NOTE — Patient Instructions (Signed)
1. See therapist as discussed.   Stress and Stress Management Stress is a normal reaction to life events. It is what you feel when life demands more than you are used to or more than you can handle. Some stress can be useful. For example, the stress reaction can help you catch the last bus of the day, study for a test, or meet a deadline at work. But stress that occurs too often or for too long can cause problems. It can affect your emotional health and interfere with relationships and normal daily activities. Too much stress can weaken your immune system and increase your risk for physical illness. If you already have a medical problem, stress can make it worse. CAUSES  All sorts of life events may cause stress. An event that causes stress for one person may not be stressful for another person. Major life events commonly cause stress. These may be positive or negative. Examples include losing your job, moving into a new home, getting married, having a baby, or losing a loved one. Less obvious life events may also cause stress, especially if they occur day after day or in combination. Examples include working long hours, driving in traffic, caring for children, being in debt, or being in a difficult relationship. SIGNS AND SYMPTOMS Stress may cause emotional symptoms including, the following:  Anxiety. This is feeling worried, afraid, on edge, overwhelmed, or out of control.  Anger. This is feeling irritated or impatient.  Depression. This is feeling sad, down, helpless, or guilty.  Difficulty focusing, remembering, or making decisions. Stress may cause physical symptoms, including the following:   Aches and pains. These may affect your head, neck, back, stomach, or other areas of your body.  Tight muscles or clenched jaw.  Low energy or trouble sleeping. Stress may cause unhealthy behaviors, including the following:   Eating to feel better (overeating) or skipping meals.  Sleeping too  little, too much, or both.  Working too much or putting off tasks (procrastination).  Smoking, drinking alcohol, or using drugs to feel better. DIAGNOSIS  Stress is diagnosed through an assessment by your health care provider. Your health care provider will ask questions about your symptoms and any stressful life events.Your health care provider will also ask about your medical history and may order blood tests or other tests. Certain medical conditions and medicine can cause physical symptoms similar to stress. Mental illness can cause emotional symptoms and unhealthy behaviors similar to stress. Your health care provider may refer you to a mental health professional for further evaluation.  TREATMENT  Stress management is the recommended treatment for stress.The goals of stress management are reducing stressful life events and coping with stress in healthy ways.  Techniques for reducing stressful life events include the following:  Stress identification. Self-monitor for stress and identify what causes stress for you. These skills may help you to avoid some stressful events.  Time management. Set your priorities, keep a calendar of events, and learn to say "no." These tools can help you avoid making too many commitments. Techniques for coping with stress include the following:  Rethinking the problem. Try to think realistically about stressful events rather than ignoring them or overreacting. Try to find the positives in a stressful situation rather than focusing on the negatives.  Exercise. Physical exercise can release both physical and emotional tension. The key is to find a form of exercise you enjoy and do it regularly.  Relaxation techniques. These relax the body and mind. Examples  include yoga, meditation, tai chi, biofeedback, deep breathing, progressive muscle relaxation, listening to music, being out in nature, journaling, and other hobbies. Again, the key is to find one or more  that you enjoy and can do regularly.  Healthy lifestyle. Eat a balanced diet, get plenty of sleep, and do not smoke. Avoid using alcohol or drugs to relax.  Strong support network. Spend time with family, friends, or other people you enjoy being around.Express your feelings and talk things over with someone you trust. Counseling or talktherapy with a mental health professional may be helpful if you are having difficulty managing stress on your own. Medicine is typically not recommended for the treatment of stress.Talk to your health care provider if you think you need medicine for symptoms of stress. HOME CARE INSTRUCTIONS  Keep all follow-up visits as directed by your health care provider.  Take all medicines as directed by your health care provider. SEEK MEDICAL CARE IF:  Your symptoms get worse or you start having new symptoms.  You feel overwhelmed by your problems and can no longer manage them on your own. SEEK IMMEDIATE MEDICAL CARE IF:  You feel like hurting yourself or someone else. Document Released: 01/28/2001 Document Revised: 12/19/2013 Document Reviewed: 03/29/2013 [facility] Patient Information 2015 [place], [place]. This information is not intended to replace advice given to you by your health care provider. Make sure you discuss any questions you have with your health care provider.

## 2014-11-15 NOTE — Progress Notes (Signed)
Subjective:    Patient ID: Paige [name], female    DOB: 1950-12-05, 64 y.o.   MRN: [id number]  HPI 64 year old white female, nonsmoker, is in today for a recheck of Hypertension, Hypothyroidism, Hypokalemia, and depression. Has stopped Lexapro 20 mg once daily. She has also stopped potassium and Adderall. Feels that the medications are not helping her. Last potassium was stable. I advised her to see a therapist and she has not seen one. Reports feeling lonely at home since her husband died years ago. Has a dog that she feels may have to be put down soon. Has feelings of hopelessness but denies any thoughts of death or dying.   Review of Systems  Constitutional: Negative.   HENT: Negative.   Respiratory: Negative.   Cardiovascular: Negative.   Gastrointestinal: Negative.   Endocrine: Negative.   Genitourinary: Negative.   Musculoskeletal: Negative.   Skin: Negative.   Allergic/Immunologic: Negative.   Neurological: Negative.   Hematological: Negative.   Psychiatric/Behavioral: Positive for decreased concentration. Negative for sleep disturbance, self-injury and agitation.   Past Medical History  Diagnosis Date  . Hypertension   . Thyroid disease   . GERD (gastroesophageal reflux disease)   . Arthritis   . Carpal tunnel syndrome   . Anxiety   . ADD (attention deficit disorder)     Pt has hard time focusing  . Colon polyp   . Depression   . Status post dilation of esophageal narrowing   . Gallstones     History   Social History  . Marital Status: Married    Spouse Name: N/A  . Number of Children: 2  . Years of Education: N/A   Occupational History  . retired   . [place] History Main Topics  . Smoking status: Never Smoker   . Smokeless tobacco: Never Used  . Alcohol Use: No  . Drug Use: No  . Sexual Activity: No   Other Topics Concern  . Not on file   Social History Narrative    Past Surgical History  Procedure  Laterality Date  . Cholecystectomy    . Abdominal hysterectomy    . Tubal ligation    . Fx leg    . Nasal sinus surgery    . Closed reduction hand fracture      right hand  . Carpal tunnel release Left     Family History  Problem Relation Age of Onset  . Lung cancer Father   . Hyperlipidemia Mother   . Cancer Paternal Grandfather     mets  . Leukemia Maternal Aunt     Allergies  Allergen Reactions  . Asa [Aspirin] Other (See Comments)    SOB and blocks ears and nose  . Morphine And Related     Current Outpatient Prescriptions on File Prior to Visit  Medication Sig Dispense Refill  . docusate sodium (COLACE) 100 MG capsule Take 100 mg by mouth daily. Equate stool softener-Take one daily    . lansoprazole (PREVACID) 30 MG capsule Take 30 mg by mouth daily.       No current facility-administered medications on file prior to visit.    BP 108/78 mmHg  Pulse 94  Temp(Src) 98.3 F (36.8 C) (Oral)  Wt 199 lb 12.8 oz (90.629 kg)chart    Objective:   Physical Exam  Constitutional: She is oriented to person, place, and time. She appears well-developed and well-nourished.  HENT:  Right Ear: External ear normal.  Left Ear: External ear normal.  Nose: Nose normal.  Mouth/Throat: Oropharynx is clear and moist.  Neck: Normal range of motion. Neck supple. No thyromegaly present.  Cardiovascular: Normal rate, regular rhythm and normal heart sounds.   Pulmonary/Chest: Effort normal and breath sounds normal.  Abdominal: Soft. Bowel sounds are normal.  Musculoskeletal: Normal range of motion.  Neurological: She is alert and oriented to person, place, and time.  Skin: Skin is warm and dry.  Psychiatric: She has a normal mood and affect.          Assessment & Plan:  Paige [name] was seen today for follow-up.  Diagnoses and all orders for this visit:  Essential hypertension  Gastroesophageal reflux disease without esophagitis  Other orders -     levothyroxine (SYNTHROID,  LEVOTHROID) 112 MCG tablet; Take 1 tablet (112 mcg total) by mouth daily. -     bisoprolol-hydrochlorothiazide (ZIAC) 5-6.25 MG per tablet; TAKE ONE TABLET BY MOUTH ONCE DAILY   Continue Ziac and Synthroid. We will hold albuterol and Lexapro that she started stopped the medication. I've advised her to see a therapist as soon as possible. Consider psychiatry. Recheck here in 4-6 months for recheck of her routine medications.

## 2015-01-03 ENCOUNTER — Other Ambulatory Visit: Payer: Self-pay

## 2015-01-03 ENCOUNTER — Telehealth: Payer: Self-pay

## 2015-01-03 DIAGNOSIS — Z1239 Encounter for other screening for malignant neoplasm of breast: Secondary | ICD-10-CM

## 2015-01-03 NOTE — Telephone Encounter (Signed)
Pt will schedule for the summer when she is out of work

## 2015-02-09 ENCOUNTER — Other Ambulatory Visit: Payer: Self-pay | Admitting: Family Medicine

## 2015-03-02 ENCOUNTER — Ambulatory Visit (INDEPENDENT_AMBULATORY_CARE_PROVIDER_SITE_OTHER): Payer: PPO | Admitting: Adult Health

## 2015-03-02 VITALS — BP 128/72 | Temp 98.3°F | Ht 61.0 in | Wt 185.5 lb

## 2015-03-02 DIAGNOSIS — M7989 Other specified soft tissue disorders: Secondary | ICD-10-CM | POA: Diagnosis not present

## 2015-03-02 NOTE — Progress Notes (Signed)
Subjective:    Patient ID: Paige [name], female    DOB: 08-25-1950, 64 y.o.   MRN: [id number]  HPI 64 year old female who presents to the office for occasional lower extremity swelling over the last six months. L>R. The swelling is localized to feet.  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 She has been eating more processed foods such as lunch meat, instant potatoes and processed meals. She does not eat out. Endorses adding salt to foods and not drinking a lot of water. Swelling goes down when she elevated her legs at night.    She denies any CP, SOB , problems with urination,or pain in calf muscles. She does not have any lower extremity claudication.    Review of Systems  Constitutional: Negative.   Respiratory: Negative.   Cardiovascular: Positive for leg swelling.  Endocrine: Negative.   Neurological: Positive for light-headedness (occassion).  All other systems reviewed and are negative.  Past Medical History  Diagnosis Date  . Hypertension   . Thyroid disease   . GERD (gastroesophageal reflux disease)   . Arthritis   . Carpal tunnel syndrome   . Anxiety   . ADD (attention deficit disorder)     Pt has hard time focusing  . Colon polyp   . Depression   . Status post dilation of esophageal narrowing   . Gallstones     History   Social History  . Marital Status: Married    Spouse Name: N/A  . Number of Children: 2  . Years of Education: N/A   Occupational History  . retired   . [place] History Main Topics  . Smoking status: Never Smoker   . Smokeless tobacco: Never Used  . Alcohol Use: No  . Drug Use: No  . Sexual Activity: No   Other Topics Concern  . Not on file   Social History Narrative    Past Surgical History  Procedure Laterality Date  . Cholecystectomy    . Abdominal hysterectomy    . Tubal ligation    . Fx leg    . Nasal sinus surgery      . Closed reduction hand fracture      right hand  . Carpal tunnel release Left     Family History  Problem Relation Age of Onset  . Lung cancer Father   . Hyperlipidemia Mother   . Cancer Paternal Grandfather     mets  . Leukemia Maternal Aunt     Allergies  Allergen Reactions  . Asa [Aspirin] Other (See Comments)    SOB and blocks ears and nose  . Morphine And Related     Current Outpatient Prescriptions on File Prior to Visit  Medication Sig Dispense Refill  . bisoprolol-hydrochlorothiazide (ZIAC) 5-6.25 MG per tablet TAKE ONE TABLET BY MOUTH ONCE DAILY 90 tablet 0  . levothyroxine (SYNTHROID, LEVOTHROID) 112 MCG tablet Take 1 tablet (112 mcg total) by mouth daily. 90 tablet 0  . docusate sodium (COLACE) 100 MG capsule Take 100 mg by mouth daily. Equate stool softener-Take one daily    . lansoprazole (PREVACID) 30 MG capsule Take 30 mg by mouth daily.       No current facility-administered medications on file prior to visit.    BP 128/72 mmHg  Temp(Src) 98.3 F (36.8 C) (Oral)  Ht 5\' 1"  (1.549 m)  Wt 185  lb 8 oz (84.142 kg)  BMI 35.07 kg/m2       Objective:   Physical Exam  Constitutional: She is oriented to person, place, and time. She appears well-developed and well-nourished. No distress.  Eyes: Right eye exhibits no discharge. Left eye exhibits no discharge.  Cardiovascular: Normal rate, regular rhythm, normal heart sounds and intact distal pulses.  Exam reveals no gallop and no friction rub.   No murmur heard. Pulmonary/Chest: Effort normal and breath sounds normal. No respiratory distress. She has no wheezes. She has no rales. She exhibits no tenderness.  Abdominal: Soft. Bowel sounds are normal. She exhibits no distension. There is no tenderness. There is no rebound and no guarding.  Musculoskeletal: Normal range of motion. She exhibits edema (trace edema in left ankle. No edema in right). She exhibits no tenderness.  Neurological: She is alert and  oriented to person, place, and time.  Skin: Skin is warm and dry. No rash noted. She is not diaphoretic. No erythema. No pallor.  Psychiatric: She has a normal mood and affect. Her behavior is normal. Judgment and thought content normal.  Nursing note and vitals reviewed.       Assessment & Plan:  1. Swelling of lower extremity - Peripherial edema vs pulmonary edema vs heart failure vs venous insufficiency vs DVT - Likely due to high sodium intake.  - Labs in February showed no issues with kidney or hepatic function.  - Not likely cardiac in nature.  - Follow up in two weeks if no change after decreasing sodium intake and increase water intake.  - Consider Echo at that time.

## 2015-03-02 NOTE — Patient Instructions (Addendum)
Work on eating a diet low in sodium. If you notice that your swelling has not subsided then let me know and we can order an echocardiogram. Inform us if you have any CP or SOB.   Peripheral Edema You have swelling in your legs (peripheral edema). This swelling is due to excess accumulation of salt and water in your body. Edema may be a sign of heart, kidney or liver disease, or a side effect of a medication. It may also be due to problems in the leg veins. Elevating your legs and using special support stockings may be very helpful, if the cause of the swelling is due to poor venous circulation. Avoid long periods of standing, whatever the cause. Treatment of edema depends on identifying the cause. Chips, pretzels, pickles and other salty foods should be avoided. Restricting salt in your diet is almost always needed. Water pills (diuretics) are often used to remove the excess salt and water from your body via urine. These medicines prevent the kidney from reabsorbing sodium. This increases urine flow. Diuretic treatment may also result in lowering of potassium levels in your body. Potassium supplements may be needed if you have to use diuretics daily. Daily weights can help you keep track of your progress in clearing your edema. You should call your caregiver for follow up care as recommended. SEEK IMMEDIATE MEDICAL CARE IF:   You have increased swelling, pain, redness, or heat in your legs.  You develop shortness of breath, especially when lying down.  You develop chest or abdominal pain, weakness, or fainting.  You have a fever. Document Released: 09/11/2004 Document Revised: 10/27/2011 Document Reviewed: 08/22/2009 [facility] Patient Information 2015 [place], [place]. This information is not intended to replace advice given to you by your health care provider. Make sure you discuss any questions you have with your health care provider.

## 2015-03-02 NOTE — Progress Notes (Signed)
Pre visit review using our clinic review tool, if applicable. No additional management support is needed unless otherwise documented below in the visit note. 

## 2015-03-07 ENCOUNTER — Encounter: Payer: Self-pay | Admitting: Internal Medicine

## 2015-04-13 ENCOUNTER — Encounter: Payer: Self-pay | Admitting: Adult Health

## 2015-04-13 ENCOUNTER — Ambulatory Visit (INDEPENDENT_AMBULATORY_CARE_PROVIDER_SITE_OTHER): Payer: PPO | Admitting: Adult Health

## 2015-04-13 VITALS — BP 124/70 | HR 71 | Temp 98.3°F | Resp 20 | Ht 61.0 in | Wt 185.5 lb

## 2015-04-13 DIAGNOSIS — F909 Attention-deficit hyperactivity disorder, unspecified type: Secondary | ICD-10-CM | POA: Diagnosis not present

## 2015-04-13 NOTE — Progress Notes (Signed)
   Subjective:    Patient ID: Paige [name], female    DOB: 05-Aug-1951, 64 y.o.   MRN: [id number]  HPI  64 year old female who presents to the office today to talk to about ADD medication. She has taken Adder all in the past, but has not taken it since April. She felt like the medication was not working well and decided not to take it.   Currently she feels as though her work is suffering due to her ADD. She feels as though people have pointed out mistakes she makes at work. Would like to know what other medication she can try      Review of Systems  Constitutional: Negative.   Respiratory: Negative.   Cardiovascular: Negative.   Skin: Negative.   Psychiatric/Behavioral: Positive for decreased concentration. Negative for suicidal ideas, behavioral problems, confusion and agitation. The patient is hyperactive. The patient is not nervous/anxious.   All other systems reviewed and are negative.      Objective:   Physical Exam  Constitutional: She is oriented to person, place, and time. She appears well-developed and well-nourished. No distress.  Cardiovascular: Normal rate, regular rhythm, normal heart sounds and intact distal pulses.  Exam reveals no gallop and no friction rub.   No murmur heard. Pulmonary/Chest: Effort normal and breath sounds normal. No respiratory distress. She has no wheezes. She has no rales. She exhibits no tenderness.  Neurological: She is alert and oriented to person, place, and time.  Skin: Skin is warm and dry. No rash noted. She is not diaphoretic. No erythema. No pallor.  Psychiatric: She has a normal mood and affect. Her behavior is normal. Judgment and thought content normal.  Nursing note and vitals reviewed.      Assessment & Plan:  1. Attention deficit hyperactivity disorder (ADHD), unspecified ADHD type - Patient was given list of ADHD clinics and psychiatrists who deal specifically with ADHD.

## 2015-04-13 NOTE — Patient Instructions (Signed)
It was great seeing you again today!  Please follow up with someone on the list that I gave you for ADHD.   Let me know if there is anything else you need.

## 2015-04-13 NOTE — Progress Notes (Signed)
Pre visit review using our clinic review tool, if applicable. No additional management support is needed unless otherwise documented below in the visit note. 

## 2015-05-10 ENCOUNTER — Other Ambulatory Visit: Payer: Self-pay | Admitting: Family

## 2015-05-25 ENCOUNTER — Ambulatory Visit (INDEPENDENT_AMBULATORY_CARE_PROVIDER_SITE_OTHER): Payer: PPO | Admitting: Family

## 2015-05-25 ENCOUNTER — Encounter: Payer: Self-pay | Admitting: Family

## 2015-05-25 VITALS — BP 120/80 | HR 68 | Temp 98.2°F | Ht 61.0 in | Wt 192.0 lb

## 2015-05-25 DIAGNOSIS — Z780 Asymptomatic menopausal state: Secondary | ICD-10-CM

## 2015-05-25 DIAGNOSIS — Z23 Encounter for immunization: Secondary | ICD-10-CM

## 2015-05-25 DIAGNOSIS — F411 Generalized anxiety disorder: Secondary | ICD-10-CM | POA: Diagnosis not present

## 2015-05-25 DIAGNOSIS — Z1231 Encounter for screening mammogram for malignant neoplasm of breast: Secondary | ICD-10-CM | POA: Diagnosis not present

## 2015-05-25 DIAGNOSIS — E039 Hypothyroidism, unspecified: Secondary | ICD-10-CM | POA: Diagnosis not present

## 2015-05-25 DIAGNOSIS — Z1239 Encounter for other screening for malignant neoplasm of breast: Secondary | ICD-10-CM

## 2015-05-25 DIAGNOSIS — I10 Essential (primary) hypertension: Secondary | ICD-10-CM

## 2015-05-25 NOTE — Patient Instructions (Signed)
1. See ADD/ ADHD clinic to start appropriate medications.  2. Complete mammogram and dexa scan   Generalized Anxiety Disorder Generalized anxiety disorder (GAD) is a mental disorder. It interferes with life functions, including relationships, work, and school. GAD is different from normal anxiety, which everyone experiences at some point in their lives in response to specific life events and activities. Normal anxiety actually helps [place] prepare for and get through these life events and activities. Normal anxiety goes away after the event or activity is over.  GAD causes anxiety that is not necessarily related to specific events or activities. It also causes excess anxiety in proportion to specific events or activities. The anxiety associated with GAD is also difficult to control. GAD can vary from mild to severe. People with severe GAD can have intense waves of anxiety with physical symptoms (panic attacks).  SYMPTOMS The anxiety and worry associated with GAD are difficult to control. This anxiety and worry are related to many life events and activities and also occur more days than not for 6 months or longer. People with GAD also have three or more of the following symptoms (one or more in children):  Restlessness.   Fatigue.  Difficulty concentrating.   Irritability.  Muscle tension.  Difficulty sleeping or unsatisfying sleep. DIAGNOSIS GAD is diagnosed through an assessment by your health care provider. Your health care provider will ask you questions aboutyour mood,physical symptoms, and events in your life. Your health care provider may ask you about your medical history and use of alcohol or drugs, including prescription medicines. Your health care provider may also do a physical exam and blood tests. Certain medical conditions and the use of certain substances can cause symptoms similar to those associated with GAD. Your health care provider may refer you to a mental health specialist  for further evaluation. TREATMENT The following therapies are usually used to treat GAD:   Medication. Antidepressant medication usually is prescribed for long-term daily control. Antianxiety medicines may be added in severe cases, especially when panic attacks occur.   Talk therapy (psychotherapy). Certain types of talk therapy can be helpful in treating GAD by providing support, education, and guidance. A form of talk therapy called cognitive behavioral therapy can teach you healthy ways to think about and react to daily life events and activities.  Stress managementtechniques. These include yoga, meditation, and exercise and can be very helpful when they are practiced regularly. A mental health specialist can help determine which treatment is best for you. Some people see improvement with one therapy. However, other people require a combination of therapies.   This information is not intended to replace advice given to you by your health care provider. Make sure you discuss any questions you have with your health care provider.   Document Released: 11/29/2012 Document Revised: 08/25/2014 Document Reviewed: 11/29/2012 Elsevier Interactive Patient Education [employer].

## 2015-05-25 NOTE — Progress Notes (Signed)
Subjective:    Patient ID: Paige [name], female    DOB: 09-26-1950, 64 y.o.   MRN: [id number]  HPI 64 year old African-American female, nonsmoker is in today requesting to be treated for attention deficit disorder. At her last office visit, Burr Medico suggest that she see for ADHD specialist. She has not done that. However, she didn't bring the list of providers he suggested. Over the past couple years we have been unsuccessful with treating anxiety, depression, or attention deficit disorder. I think that we do an appointment will be need psychiatry involved.   Review of Systems  Constitutional: Negative.   HENT: Negative.   Respiratory: Negative.   Cardiovascular: Negative.   Gastrointestinal: Negative.   Endocrine: Negative.   Genitourinary: Negative.   Musculoskeletal: Negative.   Skin: Negative.   Allergic/Immunologic: Negative.   Neurological: Negative.   Hematological: Negative.   Psychiatric/Behavioral: Negative.    Past Medical History  Diagnosis Date  . Hypertension   . Thyroid disease   . GERD (gastroesophageal reflux disease)   . Arthritis   . Carpal tunnel syndrome   . Anxiety   . ADD (attention deficit disorder)     Pt has hard time focusing  . Colon polyp   . Depression   . Status post dilation of esophageal narrowing   . Gallstones     Social History   Social History  . Marital Status: Married    Spouse Name: N/A  . Number of Children: 2  . Years of Education: N/A   Occupational History  . retired   . [place] History Main Topics  . Smoking status: Never Smoker   . Smokeless tobacco: Never Used  . Alcohol Use: No  . Drug Use: No  . Sexual Activity: No   Other Topics Concern  . Not on file   Social History Narrative    Past Surgical History  Procedure Laterality Date  . Cholecystectomy    . Abdominal hysterectomy    . Tubal ligation    . Fx leg    . Nasal sinus surgery    . Closed reduction hand  fracture      right hand  . Carpal tunnel release Left     Family History  Problem Relation Age of Onset  . Lung cancer Father   . Hyperlipidemia Mother   . Cancer Paternal Grandfather     mets  . Leukemia Maternal Aunt     Allergies  Allergen Reactions  . Asa [Aspirin] Other (See Comments)    SOB and blocks ears and nose  . Morphine And Related     Current Outpatient Prescriptions on File Prior to Visit  Medication Sig Dispense Refill  . bisoprolol-hydrochlorothiazide (ZIAC) 5-6.25 MG tablet TAKE ONE TABLET BY MOUTH ONCE DAILY 90 tablet 0  . levothyroxine (SYNTHROID, LEVOTHROID) 112 MCG tablet TAKE ONE TABLET BY MOUTH ONCE DAILY 90 tablet 0   No current facility-administered medications on file prior to visit.    BP 120/80 mmHg  Pulse 68  Temp(Src) 98.2 F (36.8 C) (Oral)  Ht 5\' 1"  (1.549 m)  Wt 192 lb (87.091 kg)  BMI 36.30 kg/m2  SpO2 96%chart    Objective:   Physical Exam  Constitutional: She is oriented to person, place, and time. She appears well-developed and well-nourished.  Neck: Normal range of motion. Neck supple. No thyromegaly present.  Cardiovascular: Normal rate, regular rhythm and normal heart sounds.   Pulmonary/Chest: Effort normal and breath sounds  normal.  Abdominal: Soft. Bowel sounds are normal.  Musculoskeletal: Normal range of motion.  Neurological: She is alert and oriented to person, place, and time.  Skin: Skin is warm and dry.  Psychiatric: She has a normal mood and affect.          Assessment & Plan:  Diagnoses and all orders for this visit:  Generalized anxiety disorder  Hypothyroidism, unspecified hypothyroidism type -     TSH  Postmenopausal -     DG Bone Density; Future  Breast cancer screening, high risk patient -     MM Digital Screening; Future  Essential hypertension   As discussed previously, patient will seek out psychiatry to be sure that she is adequately diagnosed and treated for her mental health  conditions. Follow-up here for skin check. Mammogram and bone density ordered today. Pneumonia vaccine provided today.

## 2015-05-25 NOTE — Addendum Note (Signed)
Addended by: Lowella Dandy on: 05/25/2015 04:44 PM   Modules accepted: Orders

## 2015-05-25 NOTE — Progress Notes (Signed)
Pre visit review using our clinic review tool, if applicable. No additional management support is needed unless otherwise documented below in the visit note. 

## 2015-05-28 ENCOUNTER — Telehealth: Payer: Self-pay | Admitting: Family

## 2015-05-28 ENCOUNTER — Other Ambulatory Visit: Payer: PPO

## 2015-06-01 ENCOUNTER — Other Ambulatory Visit (INDEPENDENT_AMBULATORY_CARE_PROVIDER_SITE_OTHER): Payer: PPO

## 2015-06-01 DIAGNOSIS — E039 Hypothyroidism, unspecified: Secondary | ICD-10-CM

## 2015-06-01 LAB — TSH: TSH: 1.747 u[IU]/mL (ref 0.350–4.500)

## 2015-06-04 ENCOUNTER — Telehealth: Payer: Self-pay | Admitting: Family

## 2015-06-04 NOTE — Telephone Encounter (Signed)
Ok with me 

## 2015-06-04 NOTE — Telephone Encounter (Signed)
Patient is requesting to transfer from [place] to [place] because of location.  Please advise.

## 2015-06-05 ENCOUNTER — Other Ambulatory Visit: Payer: Self-pay | Admitting: Family

## 2015-06-05 DIAGNOSIS — Z1231 Encounter for screening mammogram for malignant neoplasm of breast: Secondary | ICD-10-CM

## 2015-06-06 NOTE — Telephone Encounter (Signed)
Left vm to schedule appt

## 2015-06-18 ENCOUNTER — Ambulatory Visit
Admission: RE | Admit: 2015-06-18 | Discharge: 2015-06-18 | Disposition: A | Discharge status: Home / Self Care | Payer: PPO | Source: Ambulatory Visit | Attending: Family | Admitting: Family

## 2015-06-18 ENCOUNTER — Ambulatory Visit (INDEPENDENT_AMBULATORY_CARE_PROVIDER_SITE_OTHER)
Admission: RE | Admit: 2015-06-18 | Discharge: 2015-06-18 | Disposition: A | Discharge status: Home / Self Care | Payer: PPO | Source: Ambulatory Visit | Attending: Family | Admitting: Family

## 2015-06-18 DIAGNOSIS — Z780 Asymptomatic menopausal state: Secondary | ICD-10-CM

## 2015-06-18 DIAGNOSIS — Z1231 Encounter for screening mammogram for malignant neoplasm of breast: Secondary | ICD-10-CM

## 2015-06-22 ENCOUNTER — Ambulatory Visit (INDEPENDENT_AMBULATORY_CARE_PROVIDER_SITE_OTHER): Payer: PPO | Admitting: Family

## 2015-06-22 ENCOUNTER — Encounter: Payer: Self-pay | Admitting: Family

## 2015-06-22 VITALS — BP 130/70 | HR 81 | Temp 97.4°F | Wt 193.6 lb

## 2015-06-22 DIAGNOSIS — L570 Actinic keratosis: Secondary | ICD-10-CM | POA: Diagnosis not present

## 2015-06-22 NOTE — Patient Instructions (Signed)
Cryosurgery for Skin Conditions  Cryosurgery, also called cryotherapy, is the use of extreme cold to freeze and remove abnormal or diseased tissue. Growths on the skin such as warts, precancerous skin lesions (actinic keratoses), and some kinds of skin cancer may be removed with cryosurgery.  LET YOUR HEALTH CARE PROVIDER KNOW ABOUT:  · Any allergies you have.  · All medicines you are taking, including vitamins, herbs, eye drops, creams, and over-the-counter medicines.  · Previous problems you or members of your family have had with the use of anesthetics.  · Any blood disorders you have.  · Previous surgeries you have had.  · Medical conditions you have.  RISKS AND COMPLICATIONS  Generally, this is a safe procedure. However, as with any procedure, complications can occur. Possible complications include:  · Scars.  · Changes in skin color (lighter or darker than normal skin tone).  · Swelling.  · Nerve damage and loss of feeling (rare).  BEFORE THE PROCEDURE  No preparation is necessary.  PROCEDURE   Cryosurgery usually takes a few minutes and can be done in your health care provider's office. There are different methods for performing cryosurgery.   · Your health care provider may use a device (probe) that has liquid nitrogen flowing through it. The liquid nitrogen cools the probe. The probe is then applied to the growth until it is frozen and destroyed.  · Your health care provider may spray liquid nitrogen directly on the growth.  AFTER THE PROCEDURE  Shortly after the procedure, the treated area will become red and swollen. This is normal. You will be advised to keep the treated area clean and covered with a bandage until healed. You will be able to go home shortly after the procedure. You may need the treatment again if the growth comes back.     This information is not intended to replace advice given to you by your health care provider. Make sure you discuss any questions you have with your health care  provider.     Document Released: 08/01/2000 Document Revised: 04/06/2013 Document Reviewed: 03/04/2013  Elsevier Interactive Patient Education ©2016 Elsevier Inc[employer]

## 2015-06-22 NOTE — Progress Notes (Signed)
Pre visit review using our clinic review tool, if applicable. No additional management support is needed unless otherwise documented below in the visit note. 

## 2015-06-22 NOTE — Progress Notes (Signed)
Subjective:    Patient ID: Paige [name], female    DOB: 01-31-1951, 64 y.o.   MRN: [id number]  HPI 64 year old white female with an today for cryotherapy. She has several lesions on her skin that are rough in nature and rated. Has a history of actinic keratosis.   Review of Systems  Constitutional: Negative.   Respiratory: Negative.   Cardiovascular: Negative.   Skin:       Multiple rough, raised skin lesions to the back and upper right arm.  Psychiatric/Behavioral: Negative.    Past Medical History  Diagnosis Date  . Hypertension   . Thyroid disease   . GERD (gastroesophageal reflux disease)   . Arthritis   . Carpal tunnel syndrome   . Anxiety   . ADD (attention deficit disorder)     Pt has hard time focusing  . Colon polyp   . Depression   . Status post dilation of esophageal narrowing   . Gallstones     Social History   Social History  . Marital Status: Married    Spouse Name: N/A  . Number of Children: 2  . Years of Education: N/A   Occupational History  . retired   . [place] History Main Topics  . Smoking status: Never Smoker   . Smokeless tobacco: Never Used  . Alcohol Use: No  . Drug Use: No  . Sexual Activity: No   Other Topics Concern  . Not on file   Social History Narrative    Past Surgical History  Procedure Laterality Date  . Cholecystectomy    . Abdominal hysterectomy    . Tubal ligation    . Fx leg    . Nasal sinus surgery    . Closed reduction hand fracture      right hand  . Carpal tunnel release Left     Family History  Problem Relation Age of Onset  . Lung cancer Father   . Hyperlipidemia Mother   . Cancer Paternal Grandfather     mets  . Leukemia Maternal Aunt     Allergies  Allergen Reactions  . Asa [Aspirin] Other (See Comments)    SOB and blocks ears and nose  . Morphine And Related     Current Outpatient Prescriptions on File Prior to Visit  Medication Sig Dispense  Refill  . bisoprolol-hydrochlorothiazide (ZIAC) 5-6.25 MG tablet TAKE ONE TABLET BY MOUTH ONCE DAILY 90 tablet 0  . levothyroxine (SYNTHROID, LEVOTHROID) 112 MCG tablet TAKE ONE TABLET BY MOUTH ONCE DAILY 90 tablet 0   No current facility-administered medications on file prior to visit.    BP 130/70 mmHg  Pulse 81  Temp(Src) 97.4 F (36.3 C) (Oral)  Wt 193 lb 9.6 oz (87.816 kg)chart    Objective:   Physical Exam  Constitutional: She is oriented to person, place, and time. She appears well-developed and well-nourished.  Cardiovascular: Normal rate, regular rhythm and normal heart sounds.   Pulmonary/Chest: Breath sounds normal.  Musculoskeletal: Normal range of motion.  Neurological: She is alert and oriented to person, place, and time.  Skin: Skin is warm and dry.  Several skin lesions noted to the upper back and right arm.      Informed consent was obtained and the site was treated with 20 % acetic acid. The color of the lesion changed to white with the application of acid. The patient tolerated the procedure well and  aftercare instructions were given to the patient.  X 7 lesions      Assessment & Plan:   Call the office with any questions or concerns. Discussed the process of cryotherapy blistering, healing process, and signs and symptoms of infection.  Patient verbalized understanding.

## 2015-06-29 ENCOUNTER — Ambulatory Visit (INDEPENDENT_AMBULATORY_CARE_PROVIDER_SITE_OTHER): Payer: PPO | Admitting: Internal Medicine

## 2015-06-29 ENCOUNTER — Encounter: Payer: Self-pay | Admitting: Internal Medicine

## 2015-06-29 VITALS — BP 112/68 | HR 62 | Temp 98.2°F | Resp 16 | Ht 62.5 in | Wt 196.0 lb

## 2015-06-29 DIAGNOSIS — I10 Essential (primary) hypertension: Secondary | ICD-10-CM

## 2015-06-29 DIAGNOSIS — E039 Hypothyroidism, unspecified: Secondary | ICD-10-CM | POA: Diagnosis not present

## 2015-06-29 NOTE — Progress Notes (Signed)
Pre visit review using our clinic review tool, if applicable. No additional management support is needed unless otherwise documented below in the visit note. 

## 2015-06-29 NOTE — Patient Instructions (Addendum)
  We have reviewed your prior records including labs and tests today.   All other Health Maintenance issues reviewed.   All recommended immunizations and age-appropriate screenings are up-to-date or discussed.  She will consider the shingles vaccine.  No immunizations administered today.   Medications reviewed and updated.  No changes recommended at this time.  Your prescription(s) have been submitted to your pharmacy. Please take as directed and contact our office if you believe you are having problem(s) with the medication(s).   Please schedule followup in 6 months

## 2015-06-29 NOTE — Assessment & Plan Note (Signed)
Recent tsh last month in normal range Continue levothyroxine 112 mcg daily

## 2015-06-29 NOTE — Assessment & Plan Note (Signed)
Well controlled, stable Not currently exercising - encouraged regular exercise Encouraged weight loss  Low sodium diet Continue current medication at current dose; bisoprolol-hctz 5-6.25 mg daily

## 2015-06-29 NOTE — Progress Notes (Signed)
Subjective:    Patient ID: Paige [name], female    DOB: 1951-02-23, 64 y.o.   MRN: [id number]  HPI She is here to establish with a new pcp.  She has no concerns, except the results from her recent mammogram and bone density.    Hypertension: She is taking her medication daily. She is compliant with a low sodium diet.  She denies chest pain, edema, shortness of breath and regular headaches. She has occasional palpitations.  She is not exercising regularly.      Hypothyroidism:  She is taking her medication daily.  She denies any recent changes in energy or weight that are unexplained.  Her tsh was checked last month.   Medications and allergies reviewed with patient and updated if appropriate.  Patient Active Problem List   Diagnosis Date Noted  . Depression 06/09/2014  . ADD (attention deficit disorder) 06/09/2014  . AK (actinic keratosis) 10/19/2012  . Cutaneous skin tags 12/26/2010  . CARCINOMA, BASAL CELL, BACK 11/16/2009  . ANXIETY DISORDER, GENERALIZED 07/30/2009  . OBSTRUCTIVE SLEEP APNEA 05/28/2009  . BACK PAIN WITH RADICULOPATHY 05/28/2009  . OSTEOPENIA 03/27/2009  . PLANTAR WART 10/17/2008  . UNSPECIFIED THROMBOSED HEMORRHOIDS 10/17/2008  . CONSTIPATION, INTERMITTENT 12/20/2007  . OSTEOARTHRITIS 12/20/2007  . Hypothyroidism 06/08/2007  . Attention deficit hyperactivity disorder (ADHD) 06/08/2007  . CARPAL TUNNEL SYNDROME 06/08/2007  . Essential hypertension 06/08/2007  . GERD 06/08/2007    Current Outpatient Prescriptions on File Prior to Visit  Medication Sig Dispense Refill  . bisoprolol-hydrochlorothiazide (ZIAC) 5-6.25 MG tablet TAKE ONE TABLET BY MOUTH ONCE DAILY 90 tablet 0  . levothyroxine (SYNTHROID, LEVOTHROID) 112 MCG tablet TAKE ONE TABLET BY MOUTH ONCE DAILY 90 tablet 0   No current facility-administered medications on file prior to visit.    Past Medical History  Diagnosis Date  . Hypertension   . Thyroid disease   . GERD  (gastroesophageal reflux disease)   . Arthritis   . Carpal tunnel syndrome   . Anxiety   . ADD (attention deficit disorder)     Pt has hard time focusing  . Colon polyp   . Depression   . Status post dilation of esophageal narrowing   . Gallstones     Past Surgical History  Procedure Laterality Date  . Cholecystectomy    . Abdominal hysterectomy    . Tubal ligation    . Fx leg    . Nasal sinus surgery    . Closed reduction hand fracture      right hand  . Carpal tunnel release Left     Social History   Social History  . Marital Status: Married    Spouse Name: N/A  . Number of Children: 2  . Years of Education: N/A   Occupational History  . retired   . [place] History Main Topics  . Smoking status: Never Smoker   . Smokeless tobacco: Never Used  . Alcohol Use: No  . Drug Use: No  . Sexual Activity: No   Other Topics Concern  . None   Social History Narrative   Works at [employer] - husband died of lung cancer      No regular exercise    Review of Systems  Constitutional: Negative for fever, chills and fatigue.       Sleeps interrupted, not good quality.  Low energy  HENT: Positive for rhinorrhea. Negative for postnasal drip.  Respiratory: Negative for cough, shortness of breath and wheezing.   Cardiovascular: Positive for palpitations (occasional palpitations). Negative for chest pain and leg swelling.  Gastrointestinal: Positive for blood in stool (occasional). Negative for nausea, abdominal pain, diarrhea and constipation.       No GERD  Neurological: Positive for headaches (occasional). Negative for dizziness and light-headedness.  Psychiatric/Behavioral: Positive for decreased concentration. Negative for dysphoric mood. The patient is not nervous/anxious.        Objective:   Filed Vitals:   06/29/15 0853  BP: 112/68  Pulse: 62  Temp: 98.2 F (36.8 C)  Resp: 16   Filed Weights     06/29/15 0853  Weight: 196 lb (88.905 kg)   Body mass index is 35.26 kg/(m^2).   Physical Exam  Constitutional: She is oriented to person, place, and time. She appears well-developed and well-nourished.  HENT:  Head: Normocephalic and atraumatic.  Right Ear: External ear normal.  Left Ear: External ear normal.  B/l ear canals and TM normal  Eyes: Conjunctivae are normal.  Neck: Neck supple. No tracheal deviation present. No thyromegaly present.  No carotid bruit  Cardiovascular: Normal rate, regular rhythm and normal heart sounds.   No murmur heard. Pulmonary/Chest: Effort normal and breath sounds normal. No respiratory distress. She has no wheezes. She has no rales.  Abdominal: Soft. She exhibits no distension. There is no tenderness.  Musculoskeletal: She exhibits no edema.  Lymphadenopathy:    She has no cervical adenopathy.  Neurological: She is alert and oriented to person, place, and time.  Skin: Skin is warm and dry. No rash noted.  Psychiatric: She has a normal mood and affect. Her behavior is normal. Judgment and thought content normal.        Assessment & Plan:   See Problem List.  Encouraged regular exercise and weight loss She is overdue for her colonoscopy - encouraged her to schedule Discussed shingles vaccine  Follow up in 6 months

## 2015-08-16 ENCOUNTER — Other Ambulatory Visit: Payer: Self-pay | Admitting: Emergency Medicine

## 2015-08-16 MED ORDER — BISOPROLOL-HYDROCHLOROTHIAZIDE 5-6.25 MG PO TABS: 1.0000 | ORAL_TABLET | Freq: Every day | ORAL | Status: DC

## 2015-08-16 MED ORDER — LEVOTHYROXINE SODIUM 112 MCG PO TABS: 112.0000 ug | ORAL_TABLET | Freq: Every day | ORAL | Status: DC

## 2015-08-17 ENCOUNTER — Ambulatory Visit (HOSPITAL_COMMUNITY): Payer: Self-pay | Admitting: Psychiatry

## 2015-09-19 ENCOUNTER — Encounter (HOSPITAL_COMMUNITY): Payer: Self-pay | Admitting: Psychiatry

## 2015-09-19 ENCOUNTER — Ambulatory Visit (INDEPENDENT_AMBULATORY_CARE_PROVIDER_SITE_OTHER): Payer: PPO | Admitting: Psychiatry

## 2015-09-19 VITALS — BP 122/76 | HR 68 | Ht 62.0 in | Wt 192.4 lb

## 2015-09-19 DIAGNOSIS — F339 Major depressive disorder, recurrent, unspecified: Secondary | ICD-10-CM

## 2015-09-19 MED ORDER — ESZOPICLONE 1 MG PO TABS: 1.0000 mg | ORAL_TABLET | Freq: Every evening | ORAL | Status: DC | PRN

## 2015-09-19 MED ORDER — SERTRALINE HCL 100 MG PO TABS: ORAL_TABLET | ORAL | Status: DC

## 2015-09-19 NOTE — Progress Notes (Signed)
Psychiatric Initial Adult Assessment   Patient Identification: Paige [name] MRN:  [id number] Date of Evaluation:  09/19/2015 Referral Source: Primary care doctor Chief Complaint:   focus is my problem Visit Diagnosis: Major depression Diagnosis:  Major depression, recurrent, mild Patient Active Problem List   Diagnosis Date Noted  . Depression [F32.9] 06/09/2014  . ADD (attention deficit disorder) [F90.9] 06/09/2014  . AK (actinic keratosis) [L57.0] 10/19/2012  . Cutaneous skin tags [L91.8] 12/26/2010  . CARCINOMA, BASAL CELL, BACK [C44.599] 11/16/2009  . ANXIETY DISORDER, GENERALIZED [F41.1] 07/30/2009  . OBSTRUCTIVE SLEEP APNEA [G47.33] 05/28/2009  . BACK PAIN WITH RADICULOPATHY [IMO0002] 05/28/2009  . OSTEOPENIA [M89.9, M94.9] 03/27/2009  . PLANTAR WART [B07.0] 10/17/2008  . UNSPECIFIED THROMBOSED HEMORRHOIDS [K64.5] 10/17/2008  . CONSTIPATION, INTERMITTENT [K59.00] 12/20/2007  . OSTEOARTHRITIS [M19.90] 12/20/2007  . Hypothyroidism [E03.9] 06/08/2007  . Attention deficit hyperactivity disorder (ADHD) [F90.9] 06/08/2007  . CARPAL TUNNEL SYNDROME [G56.00] 06/08/2007  . Essential hypertension [I10] 06/08/2007  . GERD [K21.9] 06/08/2007   History of Present Illness:  This patient is a 65 year old white female is been a widow for 7 years is a mother who presently works for the [employer] system in [employer]. The patient is here because she complains of problems focusing and concentrating. She says she has a problem with her memory. She recently had neuropsychological testing performed by Dr. Rainey Pines. She describes major depression in some form of attention disorder. At this time the patient is in no relationships. She has 2 sons who are doing very well. She's one grandchild is doing very well. She seems to fairly light where she works by she's having conflict with her supervisor and she doesn't like all the women talking so much. She says everybody gets on her  nerves. The child is safe job and she feels safe and it. The patient describes herself as being cranky. In a close assessment she can admit that she is in fact depressed. She's having problems falling off to sleep as it takes her hours. She says she's eating fairly well and has a reasonable amount of energy. 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 The patient denies being suicidal at this time. She denies being worthless. Her psychomotor functioning seems to be normal. Is that she did make a suicide attempt or years ago. She's never been in a psychiatric hospital. The patient denies the use of alcohol or drugs. She denies ever having psychosis. Back in 2015 she severely injured her arm had an episode of reactive depression. He really didn't produce any vegetative symptoms and she did well with no treatment. The patient denies symptoms of generalized anxiety disorder or panic disorder she denies any evidence of OCD. The patient doesn't smoke cigarettes. 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 She had A's and B's. She is no evidence of an attention disorder. She no problems with her conduct. She felt she couldn't sit still and focusing concentrate. In essence she showed no evidence of childhood ADD. The patient apparently has been on multiple psychiatric medicines from her primary care doctors but she is never been evaluated by a psychiatrist. She doesn't even remember all the different antidepressants she's been on Wellbutrin but it didn't seem to work. The patient is never really been in psychotherapy. In today's evaluation we did review with her the psychologist evaluation. He imply that she had major depression as a significant disorder. Elements:  Associated Signs/Symptoms: Depression Symptoms:  depressed (Hypo) Manic Symptoms:   Anxiety Symptoms:   Psychotic Symptoms:   PTSD Symptoms:   Past Medical History:   Past Medical History  Diagnosis Date  . Hypertension   . Thyroid disease   . GERD (gastroesophageal reflux disease)   . Arthritis   . Carpal tunnel syndrome   . Anxiety   . ADD (attention deficit disorder)     Pt has hard time focusing  . Colon polyp   . Depression   . Status post dilation of esophageal narrowing   . Gallstones     Past Surgical History  Procedure Laterality Date  . Cholecystectomy    . Abdominal hysterectomy    . Tubal ligation    . Fx leg    . Nasal sinus surgery    . Closed reduction hand fracture      right hand  . Carpal tunnel release Left    Family History:  Family History  Problem Relation Age of Onset  . Lung cancer Father   . Hyperlipidemia Mother   . Cancer Paternal Grandfather     mets  . Leukemia Maternal Aunt    Social History:   Social History   Social History  . Marital Status: Married    Spouse Name: N/A  . Number of Children: 2  . Years of Education: N/A   Occupational History  . retired   . [place] History Main Topics  . Smoking status: Never Smoker   . Smokeless tobacco: Never Used  . Alcohol Use: No  . Drug Use: No  . Sexual Activity: No   Other Topics Concern  . None   Social History Narrative   Works at [employer] - husband died of lung cancer      No regular exercise   Additional Social History:   Musculoskeletal: Strength & Muscle Tone: within normal limits Gait & Station: normal Patient leans: N/A  Psychiatric Specialty Exam: HPI  ROS  Blood pressure 122/76, pulse 68, height 5\' 2"  (1.575 m), weight 192 lb 6.4 oz (87.272 kg).Body mass index is 35.18 kg/(m^2).  General Appearance: Casual  Eye Contact:  Good  Speech:  Clear and Coherent  Volume:  Normal  Mood:  Euthymic  Affect:  Appropriate  Thought Process:  Coherent  Orientation:  Full (Time, Place, and Person)  Thought Content:  WDL  Suicidal Thoughts:  No  Homicidal Thoughts:   No  Memory:  NA  Judgement:  Good  Insight:  Fair  Psychomotor Activity:  Normal  Concentration:  Good  Recall:  Good  Fund of Knowledge:Fair  Language: Fair  Akathisia:  No  Handed:  Right  AIMS (if indicated):    Assets:  Desire for Improvement  ADL's:  Intact  Cognition: WNL  Sleep:     Is the patient at risk to self?  No. Has the patient been a risk to self in the past 6 months?  No. Has the patient been a risk to self within the distant past?  No. Is the patient a risk to others?  No. Has the patient been a risk to others in the past 6 months?  No. Has the patient been a risk to others within the distant past?  No.  Allergies:   Allergies  Allergen Reactions  . Asa [Aspirin] Other (See Comments)    SOB and blocks ears and nose  . Morphine  And Related    Current Medications: Current Outpatient Prescriptions  Medication Sig Dispense Refill  . bisoprolol-hydrochlorothiazide (ZIAC) 5-6.25 MG tablet Take 1 tablet by mouth daily. 90 tablet 1  . eszopiclone (LUNESTA) 1 MG TABS tablet Take 1 tablet (1 mg total) by mouth at bedtime as needed for sleep. Take immediately before bedtime 30 tablet 3  . levothyroxine (SYNTHROID, LEVOTHROID) 112 MCG tablet Take 1 tablet (112 mcg total) by mouth daily. 90 tablet 1  . sertraline (ZOLOFT) 100 MG tablet Half qam  For 6 days then 1 qam 30 tablet 5   No current facility-administered medications for this visit.    Previous Psychotropic Medications: Yes   Substance Abuse History in the last 12 months:  No.  Consequences of Substance Abuse:   Medical Decision Making:  New problem, with additional work up planned  Treatment Plan Summary: At this time in a close evaluation the patient does seem to have a chronic depressed state. She doesn't meet full criteria of major depression perhaps she has dysthymic disorder which affects her concentration. I shared with her that she had prominent attention problem really wouldn't affect her  mood. But on the other hand she had clinical depression he could certainly affect her concentration and could be affecting her sleep which is happening to her. She has had some episodes of depression but I do not think there were all that significant. She's not been suicidal for over for decades. The patient is been waiting 2 months for this evaluation. She is very intent to try to treatment. She never remembers clearly that she was on Zoloft which would be the best medicine for her age group. Today we'll go ahead and start her on Zoloft and increase the dose 200 mg and also give her low-dose Lunesta 1 mg for sleep. She says she's frightened of being overmedicated. Will go slow. His heart exactly figure out a specific focus for psychotherapy. Perhaps cognitive therapy for the consideration which might help her with anxiety and depression. Syrup for now the problem seems to be a depressed mood state her second problem problems with sleep which could interact and produced irritability and effects on her ability to concentrate. This patient to return to see me in 7 weeks. At that time we will consider increasing the Zoloft and/or her Lunesta. The possibility at some point adding Strattera is a consideration. She claims the stimulant in the past had some benefit but it was only temporary. She's been unable to say that antidepressants at work but she does not know the dose the duration of those agents. I've insisted that she try to come back when necessary appointment with me in 7 weeks and will be very systematic about our approach. At minimum I think she has a dysthymic disorder. She is not suicidal. She denies chest pain shortness of breath or any neurological symptoms.    Haskel Schroeder 2/1/20174:09 PM

## 2015-09-26 ENCOUNTER — Encounter: Payer: Self-pay | Admitting: Nurse Practitioner

## 2015-09-26 ENCOUNTER — Ambulatory Visit (INDEPENDENT_AMBULATORY_CARE_PROVIDER_SITE_OTHER): Payer: PPO | Admitting: Nurse Practitioner

## 2015-09-26 ENCOUNTER — Telehealth (HOSPITAL_COMMUNITY): Payer: Self-pay

## 2015-09-26 VITALS — BP 116/64 | HR 67 | Temp 98.0°F | Ht 62.0 in | Wt 194.0 lb

## 2015-09-26 DIAGNOSIS — J069 Acute upper respiratory infection, unspecified: Secondary | ICD-10-CM | POA: Diagnosis not present

## 2015-09-26 MED ORDER — AZITHROMYCIN 250 MG PO TABS: ORAL_TABLET | ORAL | Status: DC

## 2015-09-26 NOTE — Telephone Encounter (Signed)
Telephone call with [place] to follow up on denial form received for patient's requested Eszopiclone 1 mg tablet.  Informed Dr. Casimiro Needle had not worked this week in our office until this afternoon so forms they needed to be returned could not be completed until he worked this date.  Informed forms were faxed in this afternoon but shortly after received a fax from [facility] the claim was denied as he had not responded to their requests earlier this week. Spoke with Quillian Quince, representative who reported the medication was denied and now an appeal would have to be completed since form was not returned on time even though Dr. Casimiro Needle had not been in this office to complete the form since 09/21/15.

## 2015-09-26 NOTE — Progress Notes (Signed)
Patient ID: Paige [name], female    DOB: 19-Aug-1950  Age: 65 y.o. MRN: [id number]  CC: Cough and Nasal Congestion   HPI Paige [name] presents for CC of cough x 7 days.   1) Cough productive, PNDrip, sinus pressure bilaterally much longer than the cough. Cough is worse at night. Sinus pressure worse maxillary, right ear stopped up   Treatment to date: OTC cough medicine- children's  Cetirizine  Honey Cough Drops  Sick contacts: Denies   History Paige [name] has a past medical history of Hypertension; Thyroid disease; GERD (gastroesophageal reflux disease); Arthritis; Carpal tunnel syndrome; Anxiety; ADD (attention deficit disorder); Colon polyp; Depression; Status post dilation of esophageal narrowing; and Gallstones.   She has past surgical history that includes Cholecystectomy; Abdominal hysterectomy; Tubal ligation; fx leg; Nasal sinus surgery; Closed reduction hand fracture; and Carpal tunnel release (Left).   Her family history includes Cancer in her paternal grandfather; Hyperlipidemia in her mother; Leukemia in her maternal aunt; Lung cancer in her father.She reports that she has never smoked. She has never used smokeless tobacco. She reports that she does not drink alcohol or use illicit drugs.  Outpatient Prescriptions Prior to Visit  Medication Sig Dispense Refill  . bisoprolol-hydrochlorothiazide (ZIAC) 5-6.25 MG tablet Take 1 tablet by mouth daily. 90 tablet 1  . eszopiclone (LUNESTA) 1 MG TABS tablet Take 1 tablet (1 mg total) by mouth at bedtime as needed for sleep. Take immediately before bedtime 30 tablet 3  . levothyroxine (SYNTHROID, LEVOTHROID) 112 MCG tablet Take 1 tablet (112 mcg total) by mouth daily. 90 tablet 1  . sertraline (ZOLOFT) 100 MG tablet Half qam  For 6 days then 1 qam 30 tablet 5   No facility-administered medications prior to visit.    ROS Review of Systems  Constitutional: Positive for diaphoresis. Negative for fever, chills and fatigue.   HENT: Positive for congestion, postnasal drip, rhinorrhea, sinus pressure and sneezing. Negative for ear pain and sore throat.   Eyes: Negative for visual disturbance.  Respiratory: Positive for cough. Negative for chest tightness, shortness of breath and wheezing.   Cardiovascular: Negative for chest pain, palpitations and leg swelling.  Gastrointestinal: Negative for nausea, vomiting and diarrhea.  Skin: Negative for rash.  Neurological: Negative for dizziness and headaches.    Objective:  BP 116/64 mmHg  Pulse 67  Temp(Src) 98 F (36.7 C) (Oral)  Ht 5\' 2"  (1.575 m)  Wt 194 lb (87.998 kg)  BMI 35.47 kg/m2  SpO2 94%  Physical Exam  Constitutional: She is oriented to person, place, and time. She appears well-developed and well-nourished. No distress.  HENT:  Head: Normocephalic and atraumatic.  Right Ear: External ear normal.  Left Ear: External ear normal.  Mouth/Throat: Oropharynx is clear and moist.  TM's clear bilaterally  Eyes: EOM are normal. Pupils are equal, round, and reactive to light. Right eye exhibits no discharge. Left eye exhibits no discharge. No scleral icterus.  Neck: Normal range of motion. Neck supple.  Cardiovascular: Normal rate, regular rhythm and normal heart sounds.  Exam reveals no gallop and no friction rub.   No murmur heard. Pulmonary/Chest: Effort normal and breath sounds normal. No respiratory distress. She has no wheezes. She has no rales. She exhibits no tenderness.  Lymphadenopathy:    She has no cervical adenopathy.  Neurological: She is alert and oriented to person, place, and time. No cranial nerve deficit. She exhibits normal muscle tone. Coordination normal.  Skin: Skin is warm and dry. No rash noted.  She is not diaphoretic.  Psychiatric: She has a normal mood and affect. Her behavior is normal. Judgment and thought content normal.   Assessment & Plan:   Paige [name] was seen today for cough and nasal congestion.  Diagnoses and all orders  for this visit:  Acute URI  Other orders -     azithromycin (ZITHROMAX) 250 MG tablet; Take 2 tablets by mouth on day 1, take 1 tablet by mouth each day after for 4 days.  I am having Paige [name] start on azithromycin. I am also having her maintain her bisoprolol-hydrochlorothiazide, levothyroxine, sertraline, and eszopiclone.  Meds ordered this encounter  Medications  . azithromycin (ZITHROMAX) 250 MG tablet    Sig: Take 2 tablets by mouth on day 1, take 1 tablet by mouth each day after for 4 days.    Dispense:  6 each    Refill:  0    Order Specific Question:  Supervising Provider    Answer:  Crecencio Mc [[id number]]     Follow-up: Return if symptoms worsen or fail to improve.

## 2015-09-26 NOTE — Assessment & Plan Note (Addendum)
New Onset Due to length of symptoms with worsening will treat empirically  Z-pack was sent to the pharmacy Encouraged Probiotics Continue OTC measures New recommendations on AVS FU prn worsening/failure to improve.

## 2015-09-26 NOTE — Patient Instructions (Addendum)
Mucinex- plain (Not DM) 12-hr during the day   NyQuil cold and flu- cough syrup- helpful for sleep and has a part similar to benadryl in it.   Z-pack as directed.

## 2015-09-28 NOTE — Telephone Encounter (Signed)
Fax received from [employer] approving patient's Eszopiclone 1 mg tablet from 09/27/15-08/17/16.  Fax letter sent to scan for verification.

## 2015-11-08 ENCOUNTER — Encounter (HOSPITAL_COMMUNITY): Payer: Self-pay | Admitting: Psychiatry

## 2015-11-08 ENCOUNTER — Ambulatory Visit (INDEPENDENT_AMBULATORY_CARE_PROVIDER_SITE_OTHER): Payer: PPO | Admitting: Psychiatry

## 2015-11-08 VITALS — BP 110/76 | HR 75 | Ht 61.0 in | Wt 196.6 lb

## 2015-11-08 DIAGNOSIS — F341 Dysthymic disorder: Secondary | ICD-10-CM | POA: Diagnosis not present

## 2015-11-08 MED ORDER — SERTRALINE HCL 100 MG PO TABS: ORAL_TABLET | ORAL | Status: DC

## 2015-11-08 MED ORDER — ESZOPICLONE 3 MG PO TABS: 3.0000 mg | ORAL_TABLET | Freq: Every evening | ORAL | Status: DC | PRN

## 2015-11-08 NOTE — Progress Notes (Signed)
[facility] MD Progress Note  11/08/2015 4:09 PM Paige [name]  MRN:  [id number] Subjective: Mild depression Principal Problem: Persistent depression disorder Today the patient says she is no better. She continues to have mild consistent depression that in activate her. She is no drive. She still goes to work and still performs fairly well. She describes a sleep pattern where she goes to sleep well but wakes up through the night. She doesn't take naps but she does apparently feel somewhat sleepy. She feels drained. The patient apparently concentrate okay. The patient is not psychotic. She is functioning fairly well. She's not suicidal nor she homicidal. He uses no drugs. The patient didn't have any problems going up 100 mg of Zoloft. She took 1 mg of   Lunesta with no benefits. Patient actually says that she's eating so well she is actually gaining weight. Once again this patient does not meet criteria for attention deficit disorder. Will have to determine how aggressive will be for her dysthymic disorder. It is noted this patient is been on multiple antidepressants with little benefit.  Patient Active Problem List   Diagnosis Date Noted  . Acute URI [J06.9] 09/26/2015  . Depression [F32.9] 06/09/2014  . ADD (attention deficit disorder) [F90.9] 06/09/2014  . AK (actinic keratosis) [L57.0] 10/19/2012  . Cutaneous skin tags [L91.8] 12/26/2010  . CARCINOMA, BASAL CELL, BACK [C44.599] 11/16/2009  . ANXIETY DISORDER, GENERALIZED [F41.1] 07/30/2009  . OBSTRUCTIVE SLEEP APNEA [G47.33] 05/28/2009  . BACK PAIN WITH RADICULOPATHY [IMO0002] 05/28/2009  . OSTEOPENIA [M89.9, M94.9] 03/27/2009  . PLANTAR WART [B07.0] 10/17/2008  . UNSPECIFIED THROMBOSED HEMORRHOIDS [K64.5] 10/17/2008  . CONSTIPATION, INTERMITTENT [K59.00] 12/20/2007  . OSTEOARTHRITIS [M19.90] 12/20/2007  . Hypothyroidism [E03.9] 06/08/2007  . Attention deficit hyperactivity disorder (ADHD) [F90.9] 06/08/2007  . CARPAL TUNNEL SYNDROME [G56.00]  06/08/2007  . Essential hypertension [I10] 06/08/2007  . GERD [K21.9] 06/08/2007   Total Time spent with patient: 30 minutes  Past Psychiatric History:   Past Medical History:  Past Medical History  Diagnosis Date  . Hypertension   . Thyroid disease   . GERD (gastroesophageal reflux disease)   . Arthritis   . Carpal tunnel syndrome   . Anxiety   . ADD (attention deficit disorder)     Pt has hard time focusing  . Colon polyp   . Depression   . Status post dilation of esophageal narrowing   . Gallstones     Past Surgical History  Procedure Laterality Date  . Cholecystectomy    . Abdominal hysterectomy    . Tubal ligation    . Fx leg    . Nasal sinus surgery    . Closed reduction hand fracture      right hand  . Carpal tunnel release Left    Family History:  Family History  Problem Relation Age of Onset  . Lung cancer Father   . Hyperlipidemia Mother   . Cancer Paternal Grandfather     mets  . Leukemia Maternal Aunt    Family Psychiatric  History:  Social History:  History  Alcohol Use No     History  Drug Use No    Social History   Social History  . Marital Status: Married    Spouse Name: N/A  . Number of Children: 2  . Years of Education: N/A   Occupational History  . retired   . [place] History Main Topics  . Smoking status: Never Smoker   . Smokeless tobacco:  Never Used  . Alcohol Use: No  . Drug Use: No  . Sexual Activity: No   Other Topics Concern  . None   Social History Narrative   Works at [employer] - husband died of lung cancer      No regular exercise   Additional Social History:                         Sleep: Fair  Appetite:  Good  Current Medications: Current Outpatient Prescriptions  Medication Sig Dispense Refill  . azithromycin (ZITHROMAX) 250 MG tablet Take 2 tablets by mouth on day 1, take 1 tablet by mouth each day after for 4 days. 6 each  0  . bisoprolol-hydrochlorothiazide (ZIAC) 5-6.25 MG tablet Take 1 tablet by mouth daily. 90 tablet 1  . eszopiclone 3 MG TABS Take 1 tablet (3 mg total) by mouth at bedtime as needed for sleep. Take immediately before bedtime 30 tablet 5  . levothyroxine (SYNTHROID, LEVOTHROID) 112 MCG tablet Take 1 tablet (112 mcg total) by mouth daily. 90 tablet 1  . sertraline (ZOLOFT) 100 MG tablet 1 and a half for 4 days then 2  qam 60 tablet 5   No current facility-administered medications for this visit.    Lab Results: No results found for this or any previous visit (from the past 48 hour(s)).  Blood Alcohol level:  No results found for: [facility]  Physical Findings: AIMS:  , ,  ,  ,    CIWA:    COWS:     Musculoskeletal: Strength & Muscle Tone: within normal limits Gait & Station: normal Patient leans: N/A  Psychiatric Specialty Exam: ROS  Blood pressure 110/76, pulse 75, height 5\' 1"  (1.549 m), weight 196 lb 9.6 oz (89.177 kg).Body mass index is 37.17 kg/(m^2).  General Appearance: Fairly Groomed  Engineer, water::  Good  Speech:  Clear and Coherent  Volume:  Normal  Mood:  Euthymic  Affect:  Blunt  Thought Process:  Coherent  Orientation:  Full (Time, Place, and Person)  Thought Content:  WDL  Suicidal Thoughts:  No  Homicidal Thoughts:  No  Memory:  NA  Judgement:  NA  Insight:  Good  Psychomotor Activity:  Decreased  Concentration:  Fair  Recall:  [employer] of Knowledge:Good  Language: Fair  Akathisia:  No  Handed:  Right  AIMS (if indicated):     Assets:  Desire for Improvement  ADL's:  Intact  Cognition: WNL  Sleep:      Treatment Plan Summary: At this time we'll go ahead and maximize this patient Zoloft. She'll increase it to a dose of 200 mg every morning. We'll also increase her Lunesta to the maximum dose of 3 mg. Her next visit we'll review all the medication she's been on but when she returns to see me in 5 or 6 weeks if she's not better will either discontinue the  Zoloft review the possibility of adding a stimulant to it. This patient is not suicidal. She denies chest pain or shortness of breath. She denies any neurological symptoms at this time. Physically she is well she continues to work and function.   Haskel Schroeder, MD 11/08/2015, 4:09 PM

## 2015-12-26 ENCOUNTER — Ambulatory Visit (INDEPENDENT_AMBULATORY_CARE_PROVIDER_SITE_OTHER): Payer: PPO | Admitting: Psychiatry

## 2015-12-26 VITALS — BP 120/72 | HR 74 | Ht 61.25 in | Wt 199.2 lb

## 2015-12-26 DIAGNOSIS — F341 Dysthymic disorder: Secondary | ICD-10-CM

## 2015-12-26 MED ORDER — AMPHETAMINE-DEXTROAMPHETAMINE 10 MG PO TABS: ORAL_TABLET | ORAL | Status: DC

## 2015-12-26 NOTE — Progress Notes (Signed)
Patient ID: Paige [name], female   DOB: 1951-03-10, 65 y.o.   MRN: [id number] [facility] MD Progress Note  12/26/2015 3:41 PM Paige [name]  MRN:  [id number] Subjective: Mild depression Principal Problem: Persistent depression disorder Today the patient says a minimal improvement. She actually is sleeping better. She continues to have an excessive amount of appetite continues to gain weight. She has mild depression. She claims her concentration is distinctly afflicted. Effexor ability to work. The patient is not psychotic. The patient takes no alcohol or any substances. Patient works hard at her job at the [employer]. It is noted that in the past she's had a neuropsych testing demonstrating that she's had attention deficit disorder. In my view I doubt that is truly the case but it is close clear that she does have clinical depression and doesn't seem to responding well now to the maximum dose of Zoloft. I think we should continue the Zoloft. She also continues taking Lunesta which has clearly helped her. We went from 1 mg to 3 mg and she says she sleeping better. Her problem is again she is reduced ability to enjoy things in her environment. She gets off work and goes home and doesn't do much more than watch television. She's been like this for extended period of time. She has mild sadness depression. Patient Active Problem List   Diagnosis Date Noted  . Acute URI [J06.9] 09/26/2015  . Depression [F32.9] 06/09/2014  . ADD (attention deficit disorder) [F90.9] 06/09/2014  . AK (actinic keratosis) [L57.0] 10/19/2012  . Cutaneous skin tags [L91.8] 12/26/2010  . CARCINOMA, BASAL CELL, BACK [C44.599] 11/16/2009  . ANXIETY DISORDER, GENERALIZED [F41.1] 07/30/2009  . OBSTRUCTIVE SLEEP APNEA [G47.33] 05/28/2009  . BACK PAIN WITH RADICULOPATHY [IMO0002] 05/28/2009  . OSTEOPENIA [M89.9, M94.9] 03/27/2009  . PLANTAR WART [B07.0] 10/17/2008  . UNSPECIFIED THROMBOSED HEMORRHOIDS [K64.5]  10/17/2008  . CONSTIPATION, INTERMITTENT [K59.00] 12/20/2007  . OSTEOARTHRITIS [M19.90] 12/20/2007  . Hypothyroidism [E03.9] 06/08/2007  . Attention deficit hyperactivity disorder (ADHD) [F90.9] 06/08/2007  . CARPAL TUNNEL SYNDROME [G56.00] 06/08/2007  . Essential hypertension [I10] 06/08/2007  . GERD [K21.9] 06/08/2007   Total Time spent with patient: 30 minutes  Past Psychiatric History:   Past Medical History:  Past Medical History  Diagnosis Date  . Hypertension   . Thyroid disease   . GERD (gastroesophageal reflux disease)   . Arthritis   . Carpal tunnel syndrome   . Anxiety   . ADD (attention deficit disorder)     Pt has hard time focusing  . Colon polyp   . Depression   . Status post dilation of esophageal narrowing   . Gallstones     Past Surgical History  Procedure Laterality Date  . Cholecystectomy    . Abdominal hysterectomy    . Tubal ligation    . Fx leg    . Nasal sinus surgery    . Closed reduction hand fracture      right hand  . Carpal tunnel release Left    Family History:  Family History  Problem Relation Age of Onset  . Lung cancer Father   . Hyperlipidemia Mother   . Cancer Paternal Grandfather     mets  . Leukemia Maternal Aunt    Family Psychiatric  History:  Social History:  History  Alcohol Use No     History  Drug Use No    Social History   Social History  . Marital Status: Married    Spouse Name:  N/A  . Number of Children: 2  . Years of Education: N/A   Occupational History  . retired   . [place] History Main Topics  . Smoking status: Never Smoker   . Smokeless tobacco: Never Used  . Alcohol Use: No  . Drug Use: No  . Sexual Activity: No   Other Topics Concern  . Not on file   Social History Narrative   Works at [employer] - husband died of lung cancer      No regular exercise   Additional Social History:                          Sleep: Fair  Appetite:  Good  Current Medications: Current Outpatient Prescriptions  Medication Sig Dispense Refill  . amphetamine-dextroamphetamine (ADDERALL) 10 MG tablet 2  qam  Fill after  6/ 09/2015 60 tablet 0  . azithromycin (ZITHROMAX) 250 MG tablet Take 2 tablets by mouth on day 1, take 1 tablet by mouth each day after for 4 days. 6 each 0  . bisoprolol-hydrochlorothiazide (ZIAC) 5-6.25 MG tablet Take 1 tablet by mouth daily. 90 tablet 1  . eszopiclone 3 MG TABS Take 1 tablet (3 mg total) by mouth at bedtime as needed for sleep. Take immediately before bedtime 30 tablet 5  . levothyroxine (SYNTHROID, LEVOTHROID) 112 MCG tablet Take 1 tablet (112 mcg total) by mouth daily. 90 tablet 1  . sertraline (ZOLOFT) 100 MG tablet 1 and a half for 4 days then 2  qam 60 tablet 5   No current facility-administered medications for this visit.    Lab Results: No results found for this or any previous visit (from the past 48 hour(s)).  Blood Alcohol level:  No results found for: [facility]  Physical Findings: AIMS:  , ,  ,  ,    CIWA:    COWS:     Musculoskeletal: Strength & Muscle Tone: within normal limits Gait & Station: normal Patient leans: N/A  Psychiatric Specialty Exam: ROS  Blood pressure 120/72, pulse 74, height 5' 1.25" (1.556 m), weight 199 lb 3.2 oz (90.357 kg).Body mass index is 37.32 kg/(m^2).  General Appearance: Fairly Groomed  Engineer, water::  Good  Speech:  Clear and Coherent  Volume:  Normal  Mood:  Euthymic  Affect:  Blunt  Thought Process:  Coherent  Orientation:  Full (Time, Place, and Person)  Thought Content:  WDL  Suicidal Thoughts:  No  Homicidal Thoughts:  No  Memory:  NA  Judgement:  NA  Insight:  Good  Psychomotor Activity:  Decreased  Concentration:  Fair  Recall:  [employer] of Knowledge:Good  Language: Fair  Akathisia:  No  Handed:  Right  AIMS (if indicated):     Assets:  Desire for Improvement  ADL's:  Intact  Cognition: WNL  Sleep:       Treatment Plan Summary: At this time the patient will continue taking Zoloft 200 mg and will continue on Lunesta 3 mg. Today we'll go ahead and begin her on Adderall 10 mg for 1 week and she feels no difference from it or increase it to a dose of 20 mg. This is riding Adderall as a stimulant to help her have energy concentration and hopefully have an additive effect to her Zoloft antidepressant. The patient will return to see me in approximately 2 months. This patient is no neurological  complaints. She has no cardiac disease. Is no evidence of substance abuse in this patient. Haskel Schroeder, MD 12/26/2015, 3:41 PM

## 2015-12-28 ENCOUNTER — Ambulatory Visit (HOSPITAL_COMMUNITY): Payer: Self-pay | Admitting: Psychiatry

## 2015-12-28 ENCOUNTER — Ambulatory Visit (INDEPENDENT_AMBULATORY_CARE_PROVIDER_SITE_OTHER): Payer: PPO | Admitting: Internal Medicine

## 2015-12-28 ENCOUNTER — Other Ambulatory Visit (INDEPENDENT_AMBULATORY_CARE_PROVIDER_SITE_OTHER): Payer: PPO

## 2015-12-28 ENCOUNTER — Encounter: Payer: Self-pay | Admitting: Internal Medicine

## 2015-12-28 VITALS — BP 138/84 | HR 76 | Temp 98.1°F | Resp 16 | Wt 198.0 lb

## 2015-12-28 DIAGNOSIS — I10 Essential (primary) hypertension: Secondary | ICD-10-CM

## 2015-12-28 DIAGNOSIS — E039 Hypothyroidism, unspecified: Secondary | ICD-10-CM

## 2015-12-28 LAB — COMPREHENSIVE METABOLIC PANEL
ALBUMIN: 4.9 g/dL (ref 3.5–5.2)
ALK PHOS: 90 U/L (ref 39–117)
ALT: 9 U/L (ref 0–35)
AST: 23 U/L (ref 0–37)
BILIRUBIN TOTAL: 0.4 mg/dL (ref 0.2–1.2)
BUN: 25 mg/dL — AB (ref 6–23)
CO2: 30 mEq/L (ref 19–32)
Calcium: 10.5 mg/dL (ref 8.4–10.5)
Chloride: 101 mEq/L (ref 96–112)
Creatinine, Ser: 0.69 mg/dL (ref 0.40–1.20)
GFR: 90.52 mL/min (ref >60.00)
GLUCOSE: 104 mg/dL — AB (ref 70–99)
POTASSIUM: 3.7 meq/L (ref 3.5–5.1)
SODIUM: 141 meq/L (ref 135–145)
TOTAL PROTEIN: 8.3 g/dL (ref 6.0–8.3)

## 2015-12-28 LAB — TSH: TSH: 4.7 u[IU]/mL — AB (ref 0.35–4.50)

## 2015-12-28 NOTE — Assessment & Plan Note (Signed)
Check tsh  Titrate med dose if needed  

## 2015-12-28 NOTE — Progress Notes (Signed)
Pre visit review using our clinic review tool, if applicable. No additional management support is needed unless otherwise documented below in the visit note. 

## 2015-12-28 NOTE — Assessment & Plan Note (Addendum)
BP well controlled Current regimen effective and well tolerated Continue current medications at current doses Work on exercise and weight loss

## 2015-12-28 NOTE — Patient Instructions (Addendum)
Look into the shingles vaccine.  Test(s) ordered today. Your results will be released to [place] (or called to you) after review, usually within 72hours after test completion. If any changes need to be made, you will be notified at that same time.  Medications reviewed and updated.  No changes recommended at this time.  Your prescription(s) have been submitted to your pharmacy. Please take as directed and contact our office if you believe you are having problem(s) with the medication(s).  Please followup in 6 months

## 2015-12-28 NOTE — Progress Notes (Signed)
Subjective:    Patient ID: Paige [name], female    DOB: Mar 06, 1951, 65 y.o.   MRN: [id number]  HPI She is here for follow up.  Hypertension: She is taking her medication daily. She is compliant with a low sodium diet.  She denies chest pain, palpitations, edema, shortness of breath and regular headaches. She is not exercising regularly.  She does not monitor her blood pressure at home.    Hypothyroidism:  She is taking her medication daily.  She denies any recent changes in energy or weight that are unexplained.   Depression, anxiety, ADD:  She is following with Dr Casimiro Needle and he prescribes her medication.   Medications and allergies reviewed with patient and updated if appropriate.  Patient Active Problem List   Diagnosis Date Noted  . Depression 06/09/2014  . AK (actinic keratosis) 10/19/2012  . Cutaneous skin tags 12/26/2010  . CARCINOMA, BASAL CELL, BACK 11/16/2009  . ANXIETY DISORDER, GENERALIZED 07/30/2009  . OBSTRUCTIVE SLEEP APNEA 05/28/2009  . BACK PAIN WITH RADICULOPATHY 05/28/2009  . OSTEOPENIA 03/27/2009  . PLANTAR WART 10/17/2008  . UNSPECIFIED THROMBOSED HEMORRHOIDS 10/17/2008  . CONSTIPATION, INTERMITTENT 12/20/2007  . OSTEOARTHRITIS 12/20/2007  . Hypothyroidism 06/08/2007  . Attention deficit hyperactivity disorder (ADHD) 06/08/2007  . CARPAL TUNNEL SYNDROME 06/08/2007  . Essential hypertension 06/08/2007  . GERD 06/08/2007    Current Outpatient Prescriptions on File Prior to Visit  Medication Sig Dispense Refill  . amphetamine-dextroamphetamine (ADDERALL) 10 MG tablet 2  qam  Fill after  6/ 09/2015 60 tablet 0  . bisoprolol-hydrochlorothiazide (ZIAC) 5-6.25 MG tablet Take 1 tablet by mouth daily. 90 tablet 1  . eszopiclone 3 MG TABS Take 1 tablet (3 mg total) by mouth at bedtime as needed for sleep. Take immediately before bedtime 30 tablet 5  . levothyroxine (SYNTHROID, LEVOTHROID) 112 MCG tablet Take 1 tablet (112 mcg total) by mouth daily. 90  tablet 1  . sertraline (ZOLOFT) 100 MG tablet 1 and a half for 4 days then 2  qam 60 tablet 5   No current facility-administered medications on file prior to visit.    Past Medical History  Diagnosis Date  . Hypertension   . Thyroid disease   . GERD (gastroesophageal reflux disease)   . Arthritis   . Carpal tunnel syndrome   . Anxiety   . ADD (attention deficit disorder)     Pt has hard time focusing  . Colon polyp   . Depression   . Status post dilation of esophageal narrowing   . Gallstones     Past Surgical History  Procedure Laterality Date  . Cholecystectomy    . Abdominal hysterectomy    . Tubal ligation    . Fx leg    . Nasal sinus surgery    . Closed reduction hand fracture      right hand  . Carpal tunnel release Left     Social History   Social History  . Marital Status: Married    Spouse Name: N/A  . Number of Children: 2  . Years of Education: N/A   Occupational History  . retired   . [place] History Main Topics  . Smoking status: Never Smoker   . Smokeless tobacco: Never Used  . Alcohol Use: No  . Drug Use: No  . Sexual Activity: No   Other Topics Concern  . None   Social History Narrative   Works at [employer]  [place] - husband died of lung cancer      No regular exercise    Family History  Problem Relation Age of Onset  . Lung cancer Father   . Hyperlipidemia Mother   . Cancer Paternal Grandfather     mets  . Leukemia Maternal Aunt     Review of Systems  Constitutional: Negative for fever and chills.  Respiratory: Negative for cough, shortness of breath and wheezing.   Cardiovascular: Positive for palpitations (rare). Negative for chest pain and leg swelling.  Gastrointestinal:       Burning in throat at times  Neurological: Positive for headaches. Negative for light-headedness.       Objective:   Filed Vitals:   12/28/15 1535  BP: 138/84  Pulse: 76  Temp: 98.1  F (36.7 C)  Resp: 16   Filed Weights   12/28/15 1535  Weight: 198 lb (89.812 kg)   Body mass index is 37.09 kg/(m^2).   Physical Exam Constitutional: Appears well-developed and well-nourished. No distress.  Neck: Neck supple. No tracheal deviation present. No thyromegaly present.  No carotid bruit. No cervical adenopathy.   Cardiovascular: Normal rate, regular rhythm and normal heart sounds.   No murmur heard.  No edema Pulmonary/Chest: Effort normal and breath sounds normal. No respiratory distress. No wheezes.  Psych: normal mood and afffect     Assessment & Plan:   See Problem List for Assessment and Plan of chronic medical problems.  Follow up in 6 months for a CPE Follow up in 6 months

## 2015-12-29 ENCOUNTER — Other Ambulatory Visit: Payer: Self-pay | Admitting: Internal Medicine

## 2015-12-29 MED ORDER — LEVOTHYROXINE SODIUM 125 MCG PO TABS: 125.0000 ug | ORAL_TABLET | Freq: Every day | ORAL | Status: DC

## 2016-01-16 ENCOUNTER — Encounter: Payer: Self-pay | Admitting: Internal Medicine

## 2016-01-16 ENCOUNTER — Ambulatory Visit (INDEPENDENT_AMBULATORY_CARE_PROVIDER_SITE_OTHER)
Admission: RE | Admit: 2016-01-16 | Discharge: 2016-01-16 | Disposition: A | Discharge status: Home / Self Care | Payer: PPO | Source: Ambulatory Visit | Attending: Internal Medicine | Admitting: Internal Medicine

## 2016-01-16 ENCOUNTER — Ambulatory Visit (INDEPENDENT_AMBULATORY_CARE_PROVIDER_SITE_OTHER): Payer: PPO | Admitting: Internal Medicine

## 2016-01-16 VITALS — BP 118/76 | HR 77 | Temp 98.4°F | Resp 20 | Wt 197.0 lb

## 2016-01-16 DIAGNOSIS — M542 Cervicalgia: Secondary | ICD-10-CM

## 2016-01-16 DIAGNOSIS — M5031 Other cervical disc degeneration,  high cervical region: Secondary | ICD-10-CM | POA: Diagnosis not present

## 2016-01-16 DIAGNOSIS — F411 Generalized anxiety disorder: Secondary | ICD-10-CM

## 2016-01-16 DIAGNOSIS — I10 Essential (primary) hypertension: Secondary | ICD-10-CM

## 2016-01-16 HISTORY — DX: Cervicalgia: M54.2

## 2016-01-16 MED ORDER — MELOXICAM 15 MG PO TABS: 15.0000 mg | ORAL_TABLET | Freq: Every day | ORAL | Status: DC

## 2016-01-16 MED ORDER — CYCLOBENZAPRINE HCL 5 MG PO TABS: 5.0000 mg | ORAL_TABLET | Freq: Three times a day (TID) | ORAL | Status: DC | PRN

## 2016-01-16 NOTE — Assessment & Plan Note (Signed)
1 wk acute onset mod pain nonradicular with benign exam except for some tender right post lat neck; suspect MSK in nature, though cant r/o underlying c-spoine dhjd/ddd - for nsaid prn, muscle relaxer prn, also c-spine films as have not been done in past, f/u any worsening pain or radicular symptoms

## 2016-01-16 NOTE — Assessment & Plan Note (Signed)
stable overall by history and exam, recent data reviewed with pt, and pt to continue medical treatment as before,  to f/u any worsening symptoms or concerns  BP Readings from Last 3 Encounters:  01/16/16 118/76  12/28/15 138/84  12/26/15 120/72

## 2016-01-16 NOTE — Patient Instructions (Signed)
Please take all new medication as prescribed - the anti-inflammatory and muscle relaxer as needed  Please continue all other medications as before, and refills have been done if requested.  Please have the pharmacy call with any other refills you may need..  Please keep your appointments with your specialists as you may have planned  Please go to the XRAY Department in the Basement (go straight as you get off the elevator) for the x-ray testing  Please remember to sign up for MyChart if you have not done so, as this will be important to you in the future with finding out test results, communicating by private email, and scheduling acute appointments online when needed.

## 2016-01-16 NOTE — Assessment & Plan Note (Signed)
Mild today, o/w stable overall by history and exam, and pt to continue medical treatment as before,  to f/u any worsening symptoms or concerns

## 2016-01-16 NOTE — Progress Notes (Signed)
Pre visit review using our clinic review tool, if applicable. No additional management support is needed unless otherwise documented below in the visit note. 

## 2016-01-16 NOTE — Progress Notes (Signed)
Subjective:    Patient ID: Paige [name], female    DOB: 07-May-1951, 65 y.o.   MRN: [id number]  HPI  Here after onset x 1 wk bilat upper back (now better) but persistent right post neck pain, sharp, mild to mod, intermittent, worse to move head left and right horizontally,nothing else makes better or worse, and no clear radicular pain or weakness or numbness to the upper (or lower) extremities.  No recent trauma or fall, balance ok.  Did have recent URI symptoms the week prior to onset pain above with quite a bit of coughing, most of which is now resolved.  Has hx of right shoulder surgury for what sounds like tendinopathy of some kind, but this pain is different.  Pt denies chest pain, increased sob or doe, wheezing, orthopnea, PND, increased LE swelling, palpitations, dizziness or syncope.  Denies worsening depressive symptoms, suicidal ideation, or panic; has ongoing anxiety, not increased recently.  Past Medical History  Diagnosis Date  . Hypertension   . Thyroid disease   . GERD (gastroesophageal reflux disease)   . Arthritis   . Carpal tunnel syndrome   . Anxiety   . ADD (attention deficit disorder)     Pt has hard time focusing  . Colon polyp   . Depression   . Status post dilation of esophageal narrowing   . Gallstones    Past Surgical History  Procedure Laterality Date  . Cholecystectomy    . Abdominal hysterectomy    . Tubal ligation    . Fx leg    . Nasal sinus surgery    . Closed reduction hand fracture      right hand  . Carpal tunnel release Left     reports that she has never smoked. She has never used smokeless tobacco. She reports that she does not drink alcohol or use illicit drugs. family history includes Cancer in her paternal grandfather; Hyperlipidemia in her mother; Leukemia in her maternal aunt; Lung cancer in her father. Allergies  Allergen Reactions  . Asa [Aspirin] Other (See Comments)    SOB and blocks ears and nose  . Morphine And Related  Nausea And Vomiting   Current Outpatient Prescriptions on File Prior to Visit  Medication Sig Dispense Refill  . amphetamine-dextroamphetamine (ADDERALL) 10 MG tablet 2  qam  Fill after  6/ 09/2015 60 tablet 0  . bisoprolol-hydrochlorothiazide (ZIAC) 5-6.25 MG tablet Take 1 tablet by mouth daily. 90 tablet 1  . eszopiclone 3 MG TABS Take 1 tablet (3 mg total) by mouth at bedtime as needed for sleep. Take immediately before bedtime 30 tablet 5  . levothyroxine (SYNTHROID, LEVOTHROID) 125 MCG tablet Take 1 tablet (125 mcg total) by mouth daily. 90 tablet 3  . sertraline (ZOLOFT) 100 MG tablet 1 and a half for 4 days then 2  qam 60 tablet 5   No current facility-administered medications on file prior to visit.    Review of Systems  Constitutional: Negative for unusual diaphoresis or night sweats HENT: Negative for ear swelling or discharge Eyes: Negative for worsening visual haziness  Respiratory: Negative for choking and stridor.   Gastrointestinal: Negative for distension or worsening eructation Genitourinary: Negative for retention or change in urine volume.  Musculoskeletal: Negative for other MSK pain or swelling Skin: Negative for color change and worsening wound Neurological: Negative for tremors and numbness other than noted  Psychiatric/Behavioral: Negative for decreased concentration or agitation other than above       Objective:  Physical Exam BP 118/76 mmHg  Pulse 77  Temp(Src) 98.4 F (36.9 C) (Oral)  Resp 20  Wt 197 lb (89.359 kg)  SpO2 92% VS noted, not ill appearing Constitutional: Pt appears in no apparent distress HENT: Head: NCAT.  Right Ear: External ear normal.  Left Ear: External ear normal.  Bilat tm's without erythema.  Max sinus areas non tender.  Pharynx with trace erythema, no exudate, no neck LA Eyes: . Pupils are equal, round, and reactive to light. Conjunctivae and EOM are normal Neck: Normal range of motion. Neck supple. + localized tender area  right post lateral at base of skull, no rash, swelling, no trapezoid or shoulder tender Cardiovascular: Normal rate and regular rhythm.   Pulmonary/Chest: Effort normal and breath sounds without rales or wheezing.  Neurological: Pt is alert. Not confused , motor 5/5 intact Skin: Skin is warm. No rash, no LE edema Psychiatric: Pt behavior is normal. No agitation. mild nervous    Assessment & Plan:

## 2016-02-12 ENCOUNTER — Other Ambulatory Visit: Payer: Self-pay | Admitting: Internal Medicine

## 2016-02-27 ENCOUNTER — Ambulatory Visit (INDEPENDENT_AMBULATORY_CARE_PROVIDER_SITE_OTHER): Payer: PPO | Admitting: Psychiatry

## 2016-02-27 ENCOUNTER — Encounter (HOSPITAL_COMMUNITY): Payer: Self-pay | Admitting: Psychiatry

## 2016-02-27 VITALS — BP 124/74 | HR 75 | Ht 65.0 in | Wt 201.0 lb

## 2016-02-27 DIAGNOSIS — F341 Dysthymic disorder: Secondary | ICD-10-CM

## 2016-02-27 MED ORDER — ATOMOXETINE HCL 40 MG PO CAPS: ORAL_CAPSULE | ORAL | Status: DC

## 2016-02-27 NOTE — Progress Notes (Signed)
Patient ID: Paige [name], female   DOB: 11-Feb-1951, 65 y.o.   MRN: [id number] Patient ID: Paige [name], female   DOB: [birth year], 65 y.o.   MRN: [id number] [facility] MD Progress Note  02/27/2016 2:44 PM [name]  MRN:  [id number] Subjective: Mild depression Principal Problem: Persistent depression disorder At this time the patient shares with me that she has stopped all her medications. She try to take the Adderall but week later she started having neck pain. She claims she did not was due to the medicine. It obviously was not. She is on a muscle relaxer now. But she has stopped all her medications including her Zoloft. She doesn't feel better nor worse. She'll take Lunesta now and then but for the most part I believe this patient's depression disorder is likely unaffected by antidepressants. Patient has a documented history of being diagnosed with attention deficit disorder on a neuropsych testing. I'm not anxious to give this patient a stimulant at this time. We will go ahead and begin on some Strattera. Generally she is sleeping and eating actually well. He simply is isolated and withdrawn. I think a lot of this is due to characterological state perhaps of an avoidant disorder. She does describe some mild depression. Total Time spent with patient: 30 minutes  Past Psychiatric History:   Past Medical History:  Past Medical History  Diagnosis Date  . Hypertension   . Thyroid disease   . GERD (gastroesophageal reflux disease)   . Arthritis   . Carpal tunnel syndrome   . Anxiety   . ADD (attention deficit disorder)     Pt has hard time focusing  . Colon polyp   . Depression   . Status post dilation of esophageal narrowing   . Gallstones     Past Surgical History  Procedure Laterality Date  . Cholecystectomy    . Abdominal hysterectomy    . Tubal ligation    . Fx leg    . Nasal sinus surgery    . Closed reduction hand fracture      right hand  . Carpal tunnel release  Left    Family History:  Family History  Problem Relation Age of Onset  . Lung cancer Father   . Hyperlipidemia Mother   . Cancer Paternal Grandfather     mets  . Leukemia Maternal Aunt    Family Psychiatric  History:  Social History:  History  Alcohol Use No     History  Drug Use No    Social History   Social History  . Marital Status: Married    Spouse Name: N/A  . Number of Children: 2  . Years of Education: N/A   Occupational History  . retired   . [place] History Main Topics  . Smoking status: Never Smoker   . Smokeless tobacco: Never Used  . Alcohol Use: No  . Drug Use: No  . Sexual Activity: No   Other Topics Concern  . None   Social History Narrative   Works at [employer] - husband died of lung cancer      No regular exercise   Additional Social History:                         Sleep: Fair  Appetite:  Good  Current Medications: Current Outpatient Prescriptions  Medication Sig Dispense Refill  . amphetamine-dextroamphetamine (  ADDERALL) 10 MG tablet 2  qam  Fill after  6/ 09/2015 60 tablet 0  . bisoprolol-hydrochlorothiazide (ZIAC) 5-6.25 MG tablet TAKE ONE TABLET BY MOUTH ONCE DAILY 90 tablet 3  . cyclobenzaprine (FLEXERIL) 5 MG tablet Take 1 tablet (5 mg total) by mouth 3 (three) times daily as needed for muscle spasms. 60 tablet 1  . eszopiclone 3 MG TABS Take 1 tablet (3 mg total) by mouth at bedtime as needed for sleep. Take immediately before bedtime 30 tablet 5  . levothyroxine (SYNTHROID, LEVOTHROID) 125 MCG tablet Take 1 tablet (125 mcg total) by mouth daily. 90 tablet 3  . meloxicam (MOBIC) 15 MG tablet Take 1 tablet (15 mg total) by mouth daily. As needed for pain 30 tablet 3  . sertraline (ZOLOFT) 100 MG tablet 1 and a half for 4 days then 2  qam 60 tablet 5   No current facility-administered medications for this visit.    Lab Results: No results found for this  or any previous visit (from the past 48 hour(s)).  Blood Alcohol level:  No results found for: [facility]  Physical Findings: AIMS:  , ,  ,  ,    CIWA:    COWS:     Musculoskeletal: Strength & Muscle Tone: within normal limits Gait & Station: normal Patient leans: N/A  Psychiatric Specialty Exam: ROS  Blood pressure 124/74, pulse 75, height 5\' 5"  (1.651 m), weight 201 lb (91.173 kg).Body mass index is 33.45 kg/(m^2).  General Appearance: Fairly Groomed  Engineer, water::  Good  Speech:  Clear and Coherent  Volume:  Normal  Mood:  Euthymic  Affect:  Blunt  Thought Process:  Coherent  Orientation:  Full (Time, Place, and Person)  Thought Content:  WDL  Suicidal Thoughts:  No  Homicidal Thoughts:  No  Memory:  NA  Judgement:  NA  Insight:  Good  Psychomotor Activity:  Decreased  Concentration:  Fair  Recall:  [employer] of Knowledge:Good  Language: Fair  Akathisia:  No  Handed:  Right  AIMS (if indicated):     Assets:  Desire for Improvement  ADL's:  Intact  Cognition: WNL  Sleep:      Treatment Plan Summary: 02/27/2016, 2:44 PM At this time the patient will begin on Strattera 40 mg 1 in the morning after week increasing to 40 twice a day. The patient return to see me in 3 months we'll reevaluate the issue of attention deficit disorder. This patient is not suicidal. She denies any physical complaints. She denies the use of alcohol or drugs.

## 2016-03-03 ENCOUNTER — Telehealth (HOSPITAL_COMMUNITY): Payer: Self-pay

## 2016-03-03 NOTE — Telephone Encounter (Signed)
Patient called and asked that you give her something to help her relax. Patient states her mind is racing and she can not relax - feel anxious all the time. Please review and advise, thank you.

## 2016-03-06 MED ORDER — HYDROXYZINE PAMOATE 25 MG PO CAPS: 25.0000 mg | ORAL_CAPSULE | Freq: Two times a day (BID) | ORAL | Status: DC

## 2016-03-06 NOTE — Telephone Encounter (Signed)
Spoke with Dr. Casimiro Needle, he had me send order to the pharmacy for Vistaril 25 mg 1 po BID. This was done and I called the patient to let her know.

## 2016-03-14 ENCOUNTER — Telehealth (HOSPITAL_COMMUNITY): Payer: Self-pay | Admitting: *Deleted

## 2016-03-14 NOTE — Telephone Encounter (Signed)
Prior authorization for Vistaril received. Submitted online with cover my meds. Awaiting decision to be faxed.

## 2016-03-19 ENCOUNTER — Encounter: Payer: Self-pay | Admitting: Internal Medicine

## 2016-03-19 ENCOUNTER — Ambulatory Visit (INDEPENDENT_AMBULATORY_CARE_PROVIDER_SITE_OTHER): Payer: PPO | Admitting: Internal Medicine

## 2016-03-19 VITALS — BP 128/78 | HR 69 | Temp 98.2°F | Ht 62.0 in | Wt 198.0 lb

## 2016-03-19 DIAGNOSIS — M5416 Radiculopathy, lumbar region: Secondary | ICD-10-CM | POA: Diagnosis not present

## 2016-03-19 HISTORY — DX: Radiculopathy, lumbar region: M54.16

## 2016-03-19 NOTE — Progress Notes (Signed)
Subjective:    Patient ID: Paige [name], female    DOB: May 04, 1951, 65 y.o.   MRN: [id number]  HPI She is here for an acute visit.   Tailbone pain:  She has had tailbone pain for months. The pain has been intermittent, but has been mild.  She denies obvious injuries.  In June she drove for a couple of hours and the tailbone pain got worse.  The tailbone pain increased since then.  Now she has increased tailbone pain, especially when sitting and trying to get up.  She has pain radiating down the left leg to the heel.  She has mild numbness/tingling in her leg.  She has some lower back pain and she has twinges of pain that shoots up her spine.    She denies stool incontinence or bowel changes.  She has mild urge incontinence which is new in the past few months.  It is mild.  She denies weakness in her legs - she walks around all day at work.  She has increased pain in her tailbone with sitting on harder surface.  She sleeps on her side and is able to sleep.    Medications and allergies reviewed with patient and updated if appropriate.  Patient Active Problem List   Diagnosis Date Noted  . Neck pain on right side 01/16/2016  . Depression 06/09/2014  . AK (actinic keratosis) 10/19/2012  . Cutaneous skin tags 12/26/2010  . CARCINOMA, BASAL CELL, BACK 11/16/2009  . ANXIETY DISORDER, GENERALIZED 07/30/2009  . OBSTRUCTIVE SLEEP APNEA 05/28/2009  . BACK PAIN WITH RADICULOPATHY 05/28/2009  . OSTEOPENIA 03/27/2009  . Unspecified thrombosed hemorrhoids 10/17/2008  . CONSTIPATION, INTERMITTENT 12/20/2007  . OSTEOARTHRITIS 12/20/2007  . Hypothyroidism 06/08/2007  . Attention deficit hyperactivity disorder (ADHD) 06/08/2007  . Carpal tunnel syndrome 06/08/2007  . Essential hypertension 06/08/2007  . GERD 06/08/2007    Current Outpatient Prescriptions on File Prior to Visit  Medication Sig Dispense Refill  . bisoprolol-hydrochlorothiazide (ZIAC) 5-6.25 MG tablet TAKE ONE TABLET BY MOUTH  ONCE DAILY 90 tablet 3  . cyclobenzaprine (FLEXERIL) 5 MG tablet Take 1 tablet (5 mg total) by mouth 3 (three) times daily as needed for muscle spasms. 60 tablet 1  . eszopiclone 3 MG TABS Take 1 tablet (3 mg total) by mouth at bedtime as needed for sleep. Take immediately before bedtime 30 tablet 5  . levothyroxine (SYNTHROID, LEVOTHROID) 125 MCG tablet Take 1 tablet (125 mcg total) by mouth daily. 90 tablet 3  . meloxicam (MOBIC) 15 MG tablet Take 1 tablet (15 mg total) by mouth daily. As needed for pain 30 tablet 3  . amphetamine-dextroamphetamine (ADDERALL) 10 MG tablet 2  qam  Fill after  6/ 09/2015 (Patient not taking: Reported on 03/19/2016) 60 tablet 0  . atomoxetine (STRATTERA) 40 MG capsule 1 qam  For 1 week then 1 qam  1  @ 5;00 PM (Patient not taking: Reported on 03/19/2016) 60 capsule 4  . hydrOXYzine (VISTARIL) 25 MG capsule Take 1 capsule (25 mg total) by mouth 2 (two) times daily. (Patient not taking: Reported on 03/19/2016) 60 capsule 0  . sertraline (ZOLOFT) 100 MG tablet 1 and a half for 4 days then 2  qam (Patient not taking: Reported on 03/19/2016) 60 tablet 5   No current facility-administered medications on file prior to visit.     Past Medical History:  Diagnosis Date  . ADD (attention deficit disorder)    Pt has hard time focusing  . Anxiety   .  Arthritis   . Carpal tunnel syndrome   . Colon polyp   . Depression   . Gallstones   . GERD (gastroesophageal reflux disease)   . Hypertension   . Status post dilation of esophageal narrowing   . Thyroid disease     Past Surgical History:  Procedure Laterality Date  . ABDOMINAL HYSTERECTOMY    . CARPAL TUNNEL RELEASE Left   . CHOLECYSTECTOMY    . CLOSED REDUCTION HAND FRACTURE     right hand  . fx leg    . NASAL SINUS SURGERY    . TUBAL LIGATION      Social History   Social History  . Marital status: Married    Spouse name: N/A  . Number of children: 2  . Years of education: N/A   Occupational History  .  retired Systems analyst  . [place] History Main Topics  . Smoking status: Never Smoker  . Smokeless tobacco: Never Used  . Alcohol use No  . Drug use: No  . Sexual activity: No   Other Topics Concern  . None   Social History Narrative   Works at [employer] - husband died of lung cancer      No regular exercise    Family History  Problem Relation Age of Onset  . Lung cancer Father   . Hyperlipidemia Mother   . Cancer Paternal Grandfather     mets  . Leukemia Maternal Aunt     Review of Systems  Constitutional: Negative for fever.  Gastrointestinal:       No fecal incontinence  Genitourinary:       Mild urge incontinence   Musculoskeletal: Positive for back pain.  Neurological: Positive for numbness. Negative for weakness.       Objective:   Vitals:   03/19/16 1125  BP: 128/78  Pulse: 69  Temp: 98.2 F (36.8 C)   Filed Weights   03/19/16 1125  Weight: 198 lb (89.8 kg)   Body mass index is 36.21 kg/m.   Physical Exam  Constitutional: She appears well-developed and well-nourished. No distress.  Musculoskeletal: She exhibits edema (trace in left ankle).  Tailbone pain with palpation  Neurological: She exhibits normal muscle tone.  Tenderness in lower back, no focal tenderness along spine.  Increased sensation in left leg, normal strength in LE b/l, gait with slight limp  Skin: Skin is warm and dry. She is not diaphoretic.          Assessment & Plan:   See Problem List for Assessment and Plan of chronic medical problems.

## 2016-03-19 NOTE — Progress Notes (Signed)
Pre visit review using our clinic review tool, if applicable. No additional management support is needed unless otherwise documented below in the visit note. 

## 2016-03-19 NOTE — Patient Instructions (Addendum)
Start taking the Meloxicam daily - take with food.    Medications reviewed and updated.  No changes recommended at this time.  An appointment will be made for Dr Tamala Julian.  Please call or followup if your pain is not improving and want a referral to physical therapy.

## 2016-03-19 NOTE — Assessment & Plan Note (Signed)
She is having symptoms consistent with lumbar radiculopathy and has tailbone pain She has meloxicam at home - advised her to start taking daily Ice the back Avoid hard surfaces and activities that make her pain worse She deferred PT Is willing to see Dr Tamala Julian - will refer

## 2016-03-26 NOTE — Progress Notes (Signed)
[facility] [address] [place], [place] [address] Phone: [phone] Subjective:    I'm seeing this patient by the request  of:   Binnie Rail, MD  CC Low back pain [id number]  Paige [name] is a 65 y.o. female coming in with complaint of low back pain. Patient is a set she has had pain for multiple months. Seems to be intermittent. With mild initially. Has been increasing since then. Especially worse with sitting. Radiating down the leg mostly to the left heel. Mild numbness is seems to be constant. Patient did see primary care provider and was given meloxicam.states that it did help minorly. Continues to have pain. Seems to be more in the buttocks region but does radiate to her groin. Worse with standing and walking. Better with rest.    Past Medical History:  Diagnosis Date  . ADD (attention deficit disorder)    Pt has hard time focusing  . Anxiety   . Arthritis   . Carpal tunnel syndrome   . Colon polyp   . Depression   . Gallstones   . GERD (gastroesophageal reflux disease)   . Hypertension   . Status post dilation of esophageal narrowing   . Thyroid disease    Past Surgical History:  Procedure Laterality Date  . ABDOMINAL HYSTERECTOMY    . CARPAL TUNNEL RELEASE Left   . CHOLECYSTECTOMY    . CLOSED REDUCTION HAND FRACTURE     right hand  . fx leg    . NASAL SINUS SURGERY    . TUBAL LIGATION     Social History   Social History  . Marital status: Married    Spouse name: N/A  . Number of children: 2  . Years of education: N/A   Occupational History  . retired Systems analyst  . [place] History Main Topics  . Smoking status: Never Smoker  . Smokeless tobacco: Never Used  . Alcohol use No  . Drug use: No  . Sexual activity: No   Other Topics Concern  . None   Social History Narrative   Works at [employer] - husband died of lung cancer      No regular  exercise   Allergies  Allergen Reactions  . Asa [Aspirin] Other (See Comments)    SOB and blocks ears and nose  . Morphine And Related Nausea And Vomiting   Family History  Problem Relation Age of Onset  . Lung cancer Father   . Hyperlipidemia Mother   . Cancer Paternal Grandfather     mets  . Leukemia Maternal Aunt     Past medical history, social, surgical and family history all reviewed in electronic medical record.  No pertanent information unless stated regarding to the chief complaint.   Review of Systems: No headache, visual changes, nausea, vomiting, diarrhea, constipation, dizziness, abdominal pain, skin rash, fevers, chills, night sweats, weight loss, swollen lymph nodes, body aches, joint swelling, muscle aches, chest pain, shortness of breath, mood changes.   Objective  Blood pressure 116/82, pulse 67, height 5\' 2"  (1.575 m), weight 201 lb (91.2 kg), SpO2 98 %.  General: No apparent distress alert and oriented x3 mood and affect normal, dressed appropriately.  HEENT: Pupils equal, extraocular movements intact  Respiratory: Patient's speak in full sentences and does not appear short of breath  Cardiovascular: No lower extremity edema, non tender, no erythema  Skin: Warm  dry intact with no signs of infection or rash on extremities or on axial skeleton.  Abdomen: Soft nontender  Neuro: Cranial nerves II through XII are intact, neurovascularly intact in all extremities with 2+ DTRs and 2+ pulses.  Lymph: No lymphadenopathy of posterior or anterior cervical chain or axillae bilaterally.  Gait mild antalgic gait  MSK:  Non tender with full range of motion and good stability and symmetric strength and tone of shoulders, elbows, wrist,  knee and ankles bilaterally.  Left hip exam shows the patient does have decreased internal rotation to 10. Significant pain in the groin with this movement. Mild crepitus. Patient does have full strength of the leg. Neurovascular intact distally.  Deep tendon reflexes are intact. Back exam shows the patient does have mild limitation in extension as well as left-sided rotation. Mild discomfort in the paraspinal musculature of L4-L5. Negative straight leg test. Negative Faber test but does have tightness.   Impression and Recommendations:     This case required medical decision making of moderate complexity.      Note: This dictation was prepared with Dragon dictation along with smaller phrase technology. Any transcriptional errors that result from this process are unintentional.

## 2016-03-27 ENCOUNTER — Ambulatory Visit (INDEPENDENT_AMBULATORY_CARE_PROVIDER_SITE_OTHER): Payer: PPO | Admitting: Family Medicine

## 2016-03-27 ENCOUNTER — Ambulatory Visit (INDEPENDENT_AMBULATORY_CARE_PROVIDER_SITE_OTHER)
Admission: RE | Admit: 2016-03-27 | Discharge: 2016-03-27 | Disposition: A | Discharge status: Home / Self Care | Payer: PPO | Source: Ambulatory Visit | Attending: Family Medicine | Admitting: Family Medicine

## 2016-03-27 ENCOUNTER — Encounter: Payer: Self-pay | Admitting: Family Medicine

## 2016-03-27 VITALS — BP 116/82 | HR 67 | Ht 62.0 in | Wt 201.0 lb

## 2016-03-27 DIAGNOSIS — M545 Low back pain: Secondary | ICD-10-CM | POA: Diagnosis not present

## 2016-03-27 DIAGNOSIS — M5416 Radiculopathy, lumbar region: Secondary | ICD-10-CM

## 2016-03-27 DIAGNOSIS — M25552 Pain in left hip: Secondary | ICD-10-CM

## 2016-03-27 HISTORY — DX: Pain in left hip: M25.552

## 2016-03-27 MED ORDER — GABAPENTIN 100 MG PO CAPS: 100.0000 mg | ORAL_CAPSULE | Freq: Every day | ORAL | 3 refills | Status: DC

## 2016-03-27 NOTE — Patient Instructions (Addendum)
Good to see you.  Xrays downstairs today  Ice 20 minutes 2 times daily. Usually after activity and before bed. Exercises 3 times a week.  [employer] orthotics "total support" online would be great  For exercise I would rather have you bike or elliptical Avoid any running or jumping.  Prescription: Gabapentin 100mg  at night Over the counter: Vitamin D 2000 IU dialy  Turmeric 500mg  twice daily  Tart cherry extract any dose at night See me again in 4-6 weeks.

## 2016-03-27 NOTE — Assessment & Plan Note (Signed)
Concerned with patient having decreasing internal range of motion.x-rays ordered today. We discussed different shoes as well as home exercises that could be beneficial. Concern for possible osteophytic changes. Worsening groin pain she could be a candidate for injection. Also worsening pain at follow-up question will formal physical therapy.

## 2016-03-27 NOTE — Assessment & Plan Note (Signed)
Patient does have what appears to be more of a lumbar radiculopathy. Patient subjectively states this. On exam today negative straight leg test and patient does have some mild limitation in internal range of motion of the hip. Concern for potential hip arthritis. Neck shows the patient's back as well as hip today. Given home exercises for range of motion. Patient declined formal physical therapy. Started gabapentin for any radicular symptoms. No weakness noted on exam today so I do not feel advance imaging is warranted. Patient knows if any bowel or bladder incontinence occurs to seek medical attention immediately. Patient will follow-up with me in 4-6 weeks for further evaluation.

## 2016-04-28 NOTE — Progress Notes (Signed)
[facility] [address] [place], [place] [address] Phone: [phone] Subjective:    I'm seeing this patient by the request  of:   Binnie Rail, MD  CC Low back pain, Follow-up [id number]  Paige [name] is a 65 y.o. female coming in with complaint of low back pain. Patient is a set she has had pain for multiple months. Seems to be intermittent. Patient was having pain and was given home exercises for low back as well as neck. There was some decrease in internal rotation of the hip. Patient states She has not made any significant improvement. Has been only doing the exercises intermittently. Forgets to do them and is tired at the end of the long day. Pain continues to be on the posterior aspect in the buttocks region. Patient denies any back pain associated with it. Significant radicular symptoms down the leg at this point. Patient did have x-rays of the back that were independently visualized by me showing no bony normality.    Past Medical History:  Diagnosis Date  . ADD (attention deficit disorder)    Pt has hard time focusing  . Anxiety   . Arthritis   . Carpal tunnel syndrome   . Colon polyp   . Depression   . Gallstones   . GERD (gastroesophageal reflux disease)   . Hypertension   . Status post dilation of esophageal narrowing   . Thyroid disease    Past Surgical History:  Procedure Laterality Date  . ABDOMINAL HYSTERECTOMY    . CARPAL TUNNEL RELEASE Left   . CHOLECYSTECTOMY    . CLOSED REDUCTION HAND FRACTURE     right hand  . fx leg    . NASAL SINUS SURGERY    . TUBAL LIGATION     Social History   Social History  . Marital status: Married    Spouse name: N/A  . Number of children: 2  . Years of education: N/A   Occupational History  . retired Systems analyst  . [place] History Main Topics  . Smoking status: Never Smoker  . Smokeless tobacco: Never Used  . Alcohol use No  . Drug use:  No  . Sexual activity: No   Other Topics Concern  . None   Social History Narrative   Works at [employer] - husband died of lung cancer      No regular exercise   Allergies  Allergen Reactions  . Asa [Aspirin] Other (See Comments)    SOB and blocks ears and nose  . Morphine And Related Nausea And Vomiting   Family History  Problem Relation Age of Onset  . Lung cancer Father   . Hyperlipidemia Mother   . Cancer Paternal Grandfather     mets  . Leukemia Maternal Aunt     Past medical history, social, surgical and family history all reviewed in electronic medical record.  No pertanent information unless stated regarding to the chief complaint.   Review of Systems: No headache, visual changes, nausea, vomiting, diarrhea, constipation, dizziness, abdominal pain, skin rash, fevers, chills, night sweats, weight loss, swollen lymph nodes, body aches, joint swelling, muscle aches, chest pain, shortness of breath, mood changes.   Objective  Blood pressure 128/70, pulse 80, weight 198 lb (89.8 kg), SpO2 98 %.  General: No apparent distress alert and oriented x3 mood and affect normal, dressed appropriately.  HEENT: Pupils equal, extraocular  movements intact  Respiratory: Patient's speak in full sentences and does not appear short of breath  Cardiovascular: No lower extremity edema, non tender, no erythema  Skin: Warm dry intact with no signs of infection or rash on extremities or on axial skeleton.  Abdomen: Soft nontender  Neuro: Cranial nerves II through XII are intact, neurovascularly intact in all extremities with 2+ DTRs and 2+ pulses.  Lymph: No lymphadenopathy of posterior or anterior cervical chain or axillae bilaterally.  Gait mild antalgic gait  MSK:  Non tender with full range of motion and good stability and symmetric strength and tone of shoulders, elbows, wrist,  knee and ankles bilaterally.  Left hip exam shows the patient does have decreased  internal rotation to 10. Significant pain in the groin with this movement. Mild crepitus. Patient does have full strength of the leg. Neurovascular intact distally. Deep tendon reflexes are intact.  Positive Faber on the left side. Patient does have significant tightness. Pain in the piriformis with palpation. Negative straight leg test. No tenderness over the back at this time.   Impression and Recommendations:     This case required medical decision making of moderate complexity.      Note: This dictation was prepared with Dragon dictation along with smaller phrase technology. Any transcriptional errors that result from this process are unintentional.

## 2016-04-29 ENCOUNTER — Encounter: Payer: Self-pay | Admitting: Family Medicine

## 2016-04-29 ENCOUNTER — Ambulatory Visit (INDEPENDENT_AMBULATORY_CARE_PROVIDER_SITE_OTHER): Payer: PPO | Admitting: Family Medicine

## 2016-04-29 DIAGNOSIS — G5702 Lesion of sciatic nerve, left lower limb: Secondary | ICD-10-CM | POA: Insufficient documentation

## 2016-04-29 HISTORY — DX: Lesion of sciatic nerve, left lower limb: G57.02

## 2016-04-29 MED ORDER — TIZANIDINE HCL 4 MG PO TABS: 4.0000 mg | ORAL_TABLET | Freq: Every evening | ORAL | 2 refills | Status: AC

## 2016-04-29 NOTE — Patient Instructions (Addendum)
Geat to see you  Good news is I do not think it is your back.  [facility]  Will be calling you  Ice 20 minutes 2 times daily. Usually after activity and before bed. Tennis ball in back left pocket.  Continue the vitamins Try zanaflex at night if needed.  See me again in 4 weeks and if not better we will try injection but you may want to bring someone with you .

## 2016-04-29 NOTE — Assessment & Plan Note (Signed)
Piriformis Syndrome  Using an anatomical model, reviewed with the patient the structures involved and how they related to diagnosis. The patient indicated understanding.   The patient was given a handout from Dr. Arne Cleveland book "The Sports Medicine Patient Advisor" describing the anatomy and rehabilitation of the following condition: Piriformis Syndrome  Also given a handout with more extensive Piriformis stretching, hip flexor and abductor strengthening, ham stretching  Rec deep massage, explained self-massage with ball Patient will also be sent to formal physical therapy. Given a muscle relaxer to take at night. Return to clinic in 4 weeks. If continued have pain consider injection.

## 2016-05-12 ENCOUNTER — Other Ambulatory Visit: Payer: Self-pay | Admitting: *Deleted

## 2016-05-12 DIAGNOSIS — G5702 Lesion of sciatic nerve, left lower limb: Secondary | ICD-10-CM

## 2016-05-28 ENCOUNTER — Ambulatory Visit: Payer: Self-pay | Admitting: Family Medicine

## 2016-05-28 ENCOUNTER — Ambulatory Visit (INDEPENDENT_AMBULATORY_CARE_PROVIDER_SITE_OTHER): Payer: PPO | Admitting: Psychiatry

## 2016-05-28 ENCOUNTER — Encounter (HOSPITAL_COMMUNITY): Payer: Self-pay | Admitting: Psychiatry

## 2016-05-28 VITALS — BP 112/66 | HR 80 | Ht 63.0 in | Wt 195.4 lb

## 2016-05-28 DIAGNOSIS — Z809 Family history of malignant neoplasm, unspecified: Secondary | ICD-10-CM | POA: Diagnosis not present

## 2016-05-28 DIAGNOSIS — F411 Generalized anxiety disorder: Secondary | ICD-10-CM

## 2016-05-28 DIAGNOSIS — Z806 Family history of leukemia: Secondary | ICD-10-CM

## 2016-05-28 DIAGNOSIS — Z801 Family history of malignant neoplasm of trachea, bronchus and lung: Secondary | ICD-10-CM

## 2016-05-28 MED ORDER — VENLAFAXINE HCL ER 75 MG PO CP24: 150.0000 mg | ORAL_CAPSULE | Freq: Every day | ORAL | 2 refills | Status: DC

## 2016-05-28 MED ORDER — CLONAZEPAM 0.5 MG PO TABS: ORAL_TABLET | ORAL | 4 refills | Status: DC

## 2016-05-28 NOTE — Progress Notes (Signed)
Patient ID: Paige [name], female   DOB: 05-16-1951, 65 y.o.   MRN: [id number] Patient ID: Paige [name], female   DOB: 04/26/1951, 65 y.o.   MRN: [id number] [facility] MD Progress Note  05/28/2016 5:04 PM Paige [name]  MRN:  [id number] Subjective: Mild depression sion. Total Time spent with patient: 30 minutes Today we reevaluate this patient. In a close assessment the patient mainly describes stated being anxious. In a close evaluation she says she worries about everything. She is always with ago. She's having problems concentrating obviously. He talks about straining intention. She admits that she's irritable person. She would always sleep because she takes [place]. In fact she needs to complete criteria of generalized anxiety disorder. This could explain why her concentration is poor. He could explain why she's not sleeping. The patient feels very anxious when she goes to work. The patient is not suicidal. I truly understand the concept of taking medicines for attention deficit disorder. She could not afford Strattera. She's taking Adderall the past but doesn't remember if it worked and he did seem to work is beginning lost effect. The patient has not been on any other medicines for attention deficit disorder. She is never to 9 knowledge and treated for generalized anxiety disorder. Past Psychiatric History:   Past Medical History:  Past Medical History:  Diagnosis Date  . ADD (attention deficit disorder)    Pt has hard time focusing  . Anxiety   . Arthritis   . Carpal tunnel syndrome   . Colon polyp   . Depression   . Gallstones   . GERD (gastroesophageal reflux disease)   . Hypertension   . Status post dilation of esophageal narrowing   . Thyroid disease     Past Surgical History:  Procedure Laterality Date  . ABDOMINAL HYSTERECTOMY    . CARPAL TUNNEL RELEASE Left   . CHOLECYSTECTOMY    . CLOSED REDUCTION HAND FRACTURE     right hand  . fx leg    . NASAL SINUS SURGERY     . TUBAL LIGATION     Family History:  Family History  Problem Relation Age of Onset  . Lung cancer Father   . Hyperlipidemia Mother   . Cancer Paternal Grandfather     mets  . Leukemia Maternal Aunt    Family Psychiatric  History:  Social History:  History  Alcohol Use No     History  Drug Use No    Social History   Social History  . Marital status: Married    Spouse name: N/A  . Number of children: 2  . Years of education: N/A   Occupational History  . retired Systems analyst  . [place] History Main Topics  . Smoking status: Never Smoker  . Smokeless tobacco: Never Used  . Alcohol use No  . Drug use: No  . Sexual activity: No   Other Topics Concern  . None   Social History Narrative   Works at [employer] - husband died of lung cancer      No regular exercise   Additional Social History:                         Sleep: Fair  Appetite:  Good  Current Medications: Current Outpatient Prescriptions  Medication Sig Dispense Refill  . bisoprolol-hydrochlorothiazide (ZIAC) 5-6.25 MG tablet TAKE ONE TABLET BY MOUTH ONCE  DAILY 90 tablet 3  . clonazePAM (KLONOPIN) 0.5 MG tablet 1  qam 30 tablet 4  . eszopiclone 3 MG TABS Take 1 tablet (3 mg total) by mouth at bedtime as needed for sleep. Take immediately before bedtime 30 tablet 5  . levothyroxine (SYNTHROID, LEVOTHROID) 125 MCG tablet Take 1 tablet (125 mcg total) by mouth daily. 90 tablet 3  . venlafaxine XR (EFFEXOR XR) 75 MG 24 hr capsule Take 2 capsules (150 mg total) by mouth daily. 30 capsule 2   No current facility-administered medications for this visit.     Lab Results: No results found for this or any previous visit (from the past 48 hour(s)).  Blood Alcohol level:  No results found for: [facility]  Physical Findings: AIMS:  , ,  ,  ,    CIWA:    COWS:     Musculoskeletal: Strength & Muscle Tone: within normal limits Gait &  Station: normal Patient leans: N/A  Psychiatric Specialty Exam: ROS  Blood pressure 112/66, pulse 80, height 5\' 3"  (1.6 m), weight 195 lb 6.4 oz (88.6 kg).Body mass index is 34.61 kg/m.  General Appearance: Fairly Groomed  Engineer, water::  Good  Speech:  Clear and Coherent  Volume:  Normal  Mood:  Euthymic  Affect:  Blunt  Thought Process:  Coherent  Orientation:  Full (Time, Place, and Person)  Thought Content:  WDL  Suicidal Thoughts:  No  Homicidal Thoughts:  No  Memory:  NA  Judgement:  NA  Insight:  Good  Psychomotor Activity:  Decreased  Concentration:  Fair  Recall:  [employer] of Knowledge:Good  Language: Fair  Akathisia:  No  Handed:  Right  AIMS (if indicated):     Assets:  Desire for Improvement  ADL's:  Intact  Cognition: WNL  Sleep:      Treatment Plan Summary: 05/28/2016, 5:04 PM At this time we will begin this patient on Effexor 75 mg X are. Also begin on Klonopin 0.5 mg one in the morning. The patient was tried since I seen her on Vistaril but at oversedated her. The patient really never took Strattera because she could not afford it as it had a high copayment. This patient is not suicidal. She's very frustrated at work. Everybody gets on her nerves. She's been having significant neck pain. She seeing another physician about this issue. This patient to return to see me in approximately 8 weeks for a 15 minute visit. At that time we'll consider increasing her Effexor she's not better.

## 2016-05-29 ENCOUNTER — Ambulatory Visit: Payer: Self-pay | Admitting: Family Medicine

## 2016-06-14 ENCOUNTER — Other Ambulatory Visit (HOSPITAL_COMMUNITY): Payer: Self-pay | Admitting: Psychiatry

## 2016-06-16 ENCOUNTER — Ambulatory Visit: Payer: Self-pay | Admitting: Family Medicine

## 2016-06-18 ENCOUNTER — Other Ambulatory Visit (HOSPITAL_COMMUNITY): Payer: Self-pay

## 2016-06-18 MED ORDER — ESZOPICLONE 3 MG PO TABS: 3.0000 mg | ORAL_TABLET | Freq: Every evening | ORAL | 3 refills | Status: DC | PRN

## 2016-06-27 ENCOUNTER — Encounter: Payer: Self-pay | Admitting: Internal Medicine

## 2016-06-27 ENCOUNTER — Other Ambulatory Visit (INDEPENDENT_AMBULATORY_CARE_PROVIDER_SITE_OTHER): Payer: PPO

## 2016-06-27 ENCOUNTER — Ambulatory Visit (INDEPENDENT_AMBULATORY_CARE_PROVIDER_SITE_OTHER): Payer: PPO | Admitting: Internal Medicine

## 2016-06-27 VITALS — BP 120/74 | HR 65 | Temp 97.9°F | Resp 16 | Ht 63.0 in | Wt 196.0 lb

## 2016-06-27 DIAGNOSIS — I10 Essential (primary) hypertension: Secondary | ICD-10-CM

## 2016-06-27 DIAGNOSIS — F32A Depression, unspecified: Secondary | ICD-10-CM

## 2016-06-27 DIAGNOSIS — Z1159 Encounter for screening for other viral diseases: Secondary | ICD-10-CM

## 2016-06-27 DIAGNOSIS — M8580 Other specified disorders of bone density and structure, unspecified site: Secondary | ICD-10-CM

## 2016-06-27 DIAGNOSIS — F329 Major depressive disorder, single episode, unspecified: Secondary | ICD-10-CM

## 2016-06-27 DIAGNOSIS — Z23 Encounter for immunization: Secondary | ICD-10-CM | POA: Diagnosis not present

## 2016-06-27 DIAGNOSIS — E039 Hypothyroidism, unspecified: Secondary | ICD-10-CM

## 2016-06-27 DIAGNOSIS — F411 Generalized anxiety disorder: Secondary | ICD-10-CM

## 2016-06-27 DIAGNOSIS — Z1211 Encounter for screening for malignant neoplasm of colon: Secondary | ICD-10-CM

## 2016-06-27 DIAGNOSIS — Z0000 Encounter for general adult medical examination without abnormal findings: Secondary | ICD-10-CM

## 2016-06-27 LAB — CBC WITH DIFFERENTIAL/PLATELET
Basophils Absolute: 0 K/uL (ref 0.0–0.1)
Basophils Relative: 0.4 % (ref 0.0–3.0)
Eosinophils Absolute: 0.3 K/uL (ref 0.0–0.7)
Eosinophils Relative: 4.1 % (ref 0.0–5.0)
HCT: 38 % (ref 36.0–46.0)
Hemoglobin: 12.7 g/dL (ref 12.0–15.0)
Lymphocytes Relative: 33.3 % (ref 12.0–46.0)
Lymphs Abs: 2.5 K/uL (ref 0.7–4.0)
MCHC: 33.5 g/dL (ref 30.0–36.0)
MCV: 80.6 fl (ref 78.0–100.0)
Monocytes Absolute: 0.5 K/uL (ref 0.1–1.0)
Monocytes Relative: 6.4 % (ref 3.0–12.0)
Neutro Abs: 4.1 K/uL (ref 1.4–7.7)
Neutrophils Relative %: 55.8 % (ref 43.0–77.0)
Platelets: 200 K/uL (ref 150.0–400.0)
RBC: 4.72 Mil/uL (ref 3.87–5.11)
RDW: 14 % (ref 11.5–15.5)
WBC: 7.4 K/uL (ref 4.0–10.5)

## 2016-06-27 LAB — LIPID PANEL
CHOLESTEROL: 241 mg/dL — AB (ref 0–200)
HDL: 63.8 mg/dL (ref >39.00)
LDL CALC: 148 mg/dL — AB (ref 0–99)
NonHDL: 177.29
TRIGLYCERIDES: 148 mg/dL (ref 0.0–149.0)
Total CHOL/HDL Ratio: 4
VLDL: 29.6 mg/dL (ref 0.0–40.0)

## 2016-06-27 LAB — COMPREHENSIVE METABOLIC PANEL WITH GFR
ALT: 7 U/L (ref 0–35)
AST: 19 U/L (ref 0–37)
Albumin: 4.5 g/dL (ref 3.5–5.2)
Alkaline Phosphatase: 96 U/L (ref 39–117)
BUN: 18 mg/dL (ref 6–23)
CO2: 29 meq/L (ref 19–32)
Calcium: 10.4 mg/dL (ref 8.4–10.5)
Chloride: 101 meq/L (ref 96–112)
Creatinine, Ser: 0.58 mg/dL (ref 0.40–1.20)
GFR: 110.44 mL/min
Glucose, Bld: 95 mg/dL (ref 70–99)
Potassium: 4.2 meq/L (ref 3.5–5.1)
Sodium: 139 meq/L (ref 135–145)
Total Bilirubin: 0.4 mg/dL (ref 0.2–1.2)
Total Protein: 8 g/dL (ref 6.0–8.3)

## 2016-06-27 LAB — VITAMIN D 25 HYDROXY (VIT D DEFICIENCY, FRACTURES): VITD: 34.82 ng/mL (ref 30.00–100.00)

## 2016-06-27 LAB — TSH: TSH: 0.34 u[IU]/mL — ABNORMAL LOW (ref 0.35–4.50)

## 2016-06-27 LAB — VITAMIN B12: Vitamin B-12: 419 pg/mL (ref 211–911)

## 2016-06-27 NOTE — Assessment & Plan Note (Signed)
Following with psychiatry - management per Dr Casimiro Needle

## 2016-06-27 NOTE — Patient Instructions (Addendum)
Test(s) ordered today. Your results will be released to [place] (or called to you) after review, usually within 72hours after test completion. If any changes need to be made, you will be notified at that same time.  All other Health Maintenance issues reviewed.   All recommended immunizations and age-appropriate screenings are up-to-date or discussed.  Flu  immunization administered today.  Schedule for a prevnar in about one month.  Medications reviewed and updated.  No changes recommended at this time.  Your prescription(s) have been submitted to your pharmacy. Please take as directed and contact our office if you believe you are having problem(s) with the medication(s).  A referral for GI - Dr Henrene Pastor was ordered  Please followup in one year for a physical exam  Health Maintenance, Female Adopting a healthy lifestyle and getting preventive care can go a long way to promote health and wellness. Talk with your health care provider about what schedule of regular examinations is right for you. This is a good chance for you to check in with your provider about disease prevention and staying healthy. In between checkups, there are plenty of things you can do on your own. Experts have done a lot of research about which lifestyle changes and preventive measures are most likely to keep you healthy. Ask your health care provider for more information. WEIGHT AND DIET  Eat a healthy diet  Be sure to include plenty of vegetables, fruits, low-fat dairy products, and lean protein.  Do not eat a lot of foods high in solid fats, added sugars, or salt.  Get regular exercise. This is one of the most important things you can do for your health.  Most adults should exercise for at least 150 minutes each week. The exercise should increase your heart rate and make you sweat (moderate-intensity exercise).  Most adults should also do strengthening exercises at least twice a week. This is in addition to the  moderate-intensity exercise.  Maintain a healthy weight  Body mass index (BMI) is a measurement that can be used to identify possible weight problems. It estimates body fat based on height and weight. Your health care provider can help determine your BMI and help you achieve or maintain a healthy weight.  For females 54 years of age and older:   A BMI below 18.5 is considered underweight.  A BMI of 18.5 to 24.9 is normal.  A BMI of 25 to 29.9 is considered overweight.  A BMI of 30 and above is considered obese.  Watch levels of cholesterol and blood lipids  You should start having your blood tested for lipids and cholesterol at 66 years of age, then have this test every 5 years.  You may need to have your cholesterol levels checked more often if:  Your lipid or cholesterol levels are high.  You are older than 66 years of age.  You are at high risk for heart disease.  CANCER SCREENING   Lung Cancer  Lung cancer screening is recommended for adults 72-69 years old who are at high risk for lung cancer because of a history of smoking.  A yearly low-dose CT scan of the lungs is recommended for people who:  Currently smoke.  Have quit within the past 15 years.  Have at least a 30-pack-year history of smoking. A pack year is smoking an average of one pack of cigarettes a day for 1 year.  Yearly screening should continue until it has been 15 years since you quit.  Yearly  screening should stop if you develop a health problem that would prevent you from having lung cancer treatment.  Breast Cancer  Practice breast self-awareness. This means understanding how your breasts normally appear and feel.  It also means doing regular breast self-exams. Let your health care provider know about any changes, no matter how small.  If you are in your 20s or 30s, you should have a clinical breast exam (CBE) by a health care provider every 1-3 years as part of a regular health exam.  If  you are 42 or older, have a CBE every year. Also consider having a breast X-ray (mammogram) every year.  If you have a family history of breast cancer, talk to your health care provider about genetic screening.  If you are at high risk for breast cancer, talk to your health care provider about having an MRI and a mammogram every year.  Breast cancer gene (BRCA) assessment is recommended for women who have family members with BRCA-related cancers. BRCA-related cancers include:  Breast.  Ovarian.  Tubal.  Peritoneal cancers.  Results of the assessment will determine the need for genetic counseling and BRCA1 and BRCA2 testing. Cervical Cancer Your health care provider may recommend that you be screened regularly for cancer of the pelvic organs (ovaries, uterus, and vagina). This screening involves a pelvic examination, including checking for microscopic changes to the surface of your cervix (Pap test). You may be encouraged to have this screening done every 3 years, beginning at age 62.  For women ages 80-65, health care providers may recommend pelvic exams and Pap testing every 3 years, or they may recommend the Pap and pelvic exam, combined with testing for human papilloma virus (HPV), every 5 years. Some types of HPV increase your risk of cervical cancer. Testing for HPV may also be done on women of any age with unclear Pap test results.  Other health care providers may not recommend any screening for nonpregnant women who are considered low risk for pelvic cancer and who do not have symptoms. Ask your health care provider if a screening pelvic exam is right for you.  If you have had past treatment for cervical cancer or a condition that could lead to cancer, you need Pap tests and screening for cancer for at least 20 years after your treatment. If Pap tests have been discontinued, your risk factors (such as having a new sexual partner) need to be reassessed to determine if screening should  resume. Some women have medical problems that increase the chance of getting cervical cancer. In these cases, your health care provider may recommend more frequent screening and Pap tests. Colorectal Cancer  This type of cancer can be detected and often prevented.  Routine colorectal cancer screening usually begins at 66 years of age and continues through 66 years of age.  Your health care provider may recommend screening at an earlier age if you have risk factors for colon cancer.  Your health care provider may also recommend using home test kits to check for hidden blood in the stool.  A small camera at the end of a tube can be used to examine your colon directly (sigmoidoscopy or colonoscopy). This is done to check for the earliest forms of colorectal cancer.  Routine screening usually begins at age 86.  Direct examination of the colon should be repeated every 5-10 years through 66 years of age. However, you may need to be screened more often if early forms of precancerous polyps or small  growths are found. Skin Cancer  Check your skin from head to toe regularly.  Tell your health care provider about any new moles or changes in moles, especially if there is a change in a mole's shape or color.  Also tell your health care provider if you have a mole that is larger than the size of a pencil eraser.  Always use sunscreen. Apply sunscreen liberally and repeatedly throughout the day.  Protect yourself by wearing long sleeves, pants, a wide-brimmed hat, and sunglasses whenever you are outside. HEART DISEASE, DIABETES, AND HIGH BLOOD PRESSURE   High blood pressure causes heart disease and increases the risk of stroke. High blood pressure is more likely to develop in:  People who have blood pressure in the high end of the normal range (130-139/85-89 mm Hg).  People who are overweight or obese.  People who are African American.  If you are 45-41 years of age, have your blood pressure  checked every 3-5 years. If you are 55 years of age or older, have your blood pressure checked every year. You should have your blood pressure measured twice--once when you are at a hospital or clinic, and once when you are not at a hospital or clinic. Record the average of the two measurements. To check your blood pressure when you are not at a hospital or clinic, you can use:  An automated blood pressure machine at a pharmacy.  A home blood pressure monitor.  If you are between 18 years and 31 years old, ask your health care provider if you should take aspirin to prevent strokes.  Have regular diabetes screenings. This involves taking a blood sample to check your fasting blood sugar level.  If you are at a normal weight and have a low risk for diabetes, have this test once every three years after 66 years of age.  If you are overweight and have a high risk for diabetes, consider being tested at a younger age or more often. PREVENTING INFECTION  Hepatitis B  If you have a higher risk for hepatitis B, you should be screened for this virus. You are considered at high risk for hepatitis B if:  You were born in a country where hepatitis B is common. Ask your health care provider which countries are considered high risk.  Your parents were born in a high-risk country, and you have not been immunized against hepatitis B (hepatitis B vaccine).  You have HIV or AIDS.  You use needles to inject street drugs.  You live with someone who has hepatitis B.  You have had sex with someone who has hepatitis B.  You get hemodialysis treatment.  You take certain medicines for conditions, including cancer, organ transplantation, and autoimmune conditions. Hepatitis C  Blood testing is recommended for:  Everyone born from [birth year] through 1966.  Anyone with known risk factors for hepatitis C. Sexually transmitted infections (STIs)  You should be screened for sexually transmitted infections (STIs)  including gonorrhea and chlamydia if:  You are sexually active and are younger than 66 years of age.  You are older than 66 years of age and your health care provider tells you that you are at risk for this type of infection.  Your sexual activity has changed since you were last screened and you are at an increased risk for chlamydia or gonorrhea. Ask your health care provider if you are at risk.  If you do not have HIV, but are at risk, it may be recommended  that you take a prescription medicine daily to prevent HIV infection. This is called pre-exposure prophylaxis (PrEP). You are considered at risk if:  You are sexually active and do not regularly use condoms or know the HIV status of your partner(s).  You take drugs by injection.  You are sexually active with a partner who has HIV. Talk with your health care provider about whether you are at high risk of being infected with HIV. If you choose to begin PrEP, you should first be tested for HIV. You should then be tested every 3 months for as long as you are taking PrEP.  PREGNANCY   If you are premenopausal and you may become pregnant, ask your health care provider about preconception counseling.  If you may become pregnant, take 400 to 800 micrograms (mcg) of folic acid every day.  If you want to prevent pregnancy, talk to your health care provider about birth control (contraception). OSTEOPOROSIS AND MENOPAUSE   Osteoporosis is a disease in which the bones lose minerals and strength with aging. This can result in serious bone fractures. Your risk for osteoporosis can be identified using a bone density scan.  If you are 42 years of age or older, or if you are at risk for osteoporosis and fractures, ask your health care provider if you should be screened.  Ask your health care provider whether you should take a calcium or vitamin D supplement to lower your risk for osteoporosis.  Menopause may have certain physical symptoms and  risks.  Hormone replacement therapy may reduce some of these symptoms and risks. Talk to your health care provider about whether hormone replacement therapy is right for you.  HOME CARE INSTRUCTIONS   Schedule regular health, dental, and eye exams.  Stay current with your immunizations.   Do not use any tobacco products including cigarettes, chewing tobacco, or electronic cigarettes.  If you are pregnant, do not drink alcohol.  If you are breastfeeding, limit how much and how often you drink alcohol.  Limit alcohol intake to no more than 1 drink per day for nonpregnant women. One drink equals 12 ounces of beer, 5 ounces of wine, or 1 ounces of hard liquor.  Do not use street drugs.  Do not share needles.  Ask your health care provider for help if you need support or information about quitting drugs.  Tell your health care provider if you often feel depressed.  Tell your health care provider if you have ever been abused or do not feel safe at home.   This information is not intended to replace advice given to you by your health care provider. Make sure you discuss any questions you have with your health care provider.   Document Released: 02/17/2011 Document Revised: 08/25/2014 Document Reviewed: 07/06/2013 Elsevier Interactive Patient Education [employer].

## 2016-06-27 NOTE — Progress Notes (Signed)
Pre visit review using our clinic review tool, if applicable. No additional management support is needed unless otherwise documented below in the visit note. 

## 2016-06-27 NOTE — Assessment & Plan Note (Addendum)
BP well controlled Current regimen effective and well tolerated Continue current medications at current doses cmp  

## 2016-06-27 NOTE — Assessment & Plan Note (Addendum)
dexa up to date Check vitamin d level

## 2016-06-27 NOTE — Progress Notes (Signed)
Subjective:    Patient ID: Paige [name], female    DOB: 1950/09/07, 65 y.o.   MRN: [id number]  HPI She is here for a physical exam.   She has no concerns.  She denies changes in her health since she was here last.   Medications and allergies reviewed with patient and updated if appropriate.  Patient Active Problem List   Diagnosis Date Noted  . Piriformis syndrome of left side 04/29/2016  . Left hip pain 03/27/2016  . Lumbar radiculopathy 03/19/2016  . Neck pain on right side 01/16/2016  . Depression 06/09/2014  . AK (actinic keratosis) 10/19/2012  . Cutaneous skin tags 12/26/2010  . CARCINOMA, BASAL CELL, BACK 11/16/2009  . ANXIETY DISORDER, GENERALIZED 07/30/2009  . OBSTRUCTIVE SLEEP APNEA 05/28/2009  . BACK PAIN WITH RADICULOPATHY 05/28/2009  . Osteopenia 03/27/2009  . Unspecified thrombosed hemorrhoids 10/17/2008  . CONSTIPATION, INTERMITTENT 12/20/2007  . OSTEOARTHRITIS 12/20/2007  . Hypothyroidism 06/08/2007  . Attention deficit hyperactivity disorder (ADHD) 06/08/2007  . Carpal tunnel syndrome 06/08/2007  . Essential hypertension 06/08/2007  . GERD 06/08/2007    Current Outpatient Prescriptions on File Prior to Visit  Medication Sig Dispense Refill  . bisoprolol-hydrochlorothiazide (ZIAC) 5-6.25 MG tablet TAKE ONE TABLET BY MOUTH ONCE DAILY 90 tablet 3  . clonazePAM (KLONOPIN) 0.5 MG tablet 1  qam 30 tablet 4  . Eszopiclone 3 MG TABS Take 1 tablet (3 mg total) by mouth at bedtime as needed. Take immediately before bedtime 30 tablet 3  . levothyroxine (SYNTHROID, LEVOTHROID) 125 MCG tablet Take 1 tablet (125 mcg total) by mouth daily. 90 tablet 3  . venlafaxine XR (EFFEXOR XR) 75 MG 24 hr capsule Take 2 capsules (150 mg total) by mouth daily. 30 capsule 2   No current facility-administered medications on file prior to visit.     Past Medical History:  Diagnosis Date  . ADD (attention deficit disorder)    Pt has hard time focusing  . Anxiety   .  Arthritis   . Carpal tunnel syndrome   . Colon polyp   . Depression   . Gallstones   . GERD (gastroesophageal reflux disease)   . Hypertension   . Status post dilation of esophageal narrowing   . Thyroid disease     Past Surgical History:  Procedure Laterality Date  . ABDOMINAL HYSTERECTOMY    . CARPAL TUNNEL RELEASE Left   . CHOLECYSTECTOMY    . CLOSED REDUCTION HAND FRACTURE     right hand  . fx leg    . NASAL SINUS SURGERY    . TUBAL LIGATION      Social History   Social History  . Marital status: Married    Spouse name: N/A  . Number of children: 2  . Years of education: N/A   Occupational History  . retired Systems analyst  . [place] History Main Topics  . Smoking status: Never Smoker  . Smokeless tobacco: Never Used  . Alcohol use No  . Drug use: No  . Sexual activity: No   Other Topics Concern  . Not on file   Social History Narrative   Works at [employer] - husband died of lung cancer      No regular exercise    Family History  Problem Relation Age of Onset  . Lung cancer Father   . Hyperlipidemia Mother   . Cancer Paternal Grandfather  mets  . Leukemia Maternal Aunt     Review of Systems  Constitutional: Positive for diaphoresis (sweats a lot). Negative for chills and fever.  Eyes: Negative for visual disturbance.  Respiratory: Negative for cough, shortness of breath and wheezing.   Cardiovascular: Positive for leg swelling (intermittent). Negative for chest pain and palpitations.  Gastrointestinal: Positive for abdominal pain (occ), anal bleeding and blood in stool. Negative for constipation, diarrhea and nausea.       Occ gerd  Endocrine: Negative for polyuria.  Genitourinary: Negative for dysuria and hematuria.  Skin: Negative for color change and rash.  Neurological: Positive for headaches (occ). Negative for light-headedness.  Psychiatric/Behavioral: Positive for dysphoric  mood. The patient is nervous/anxious.        Objective:   Vitals:   06/27/16 1401  BP: 120/74  Pulse: 65  Resp: 16  Temp: 97.9 F (36.6 C)   Filed Weights   06/27/16 1401  Weight: 196 lb (88.9 kg)   Body mass index is 34.72 kg/m.   Physical Exam Constitutional: She appears well-developed and well-nourished. No distress.  HENT:  Head: Normocephalic and atraumatic.  Right Ear: External ear normal. Normal ear canal and TM Left Ear: External ear normal.  Normal ear canal and TM Mouth/Throat: Oropharynx is clear and moist.  Eyes: Conjunctivae and EOM are normal.  Neck: Neck supple. No tracheal deviation present. No thyromegaly present.  No carotid bruit  Cardiovascular: Normal rate, regular rhythm and normal heart sounds.   No murmur heard.  No edema. Pulmonary/Chest: Effort normal and breath sounds normal. No respiratory distress. She has no wheezes. She has no rales.  Breast: deferred to Gyn Abdominal: Soft. She exhibits no distension. There is no tenderness.  Lymphadenopathy: She has no cervical adenopathy.  Skin: Skin is warm and dry. She is not diaphoretic.  Psychiatric: She has a normal mood and affect. Her behavior is normal.       Assessment & Plan:   Physical exam: Screening blood work  Ordered  Immunizations  Discussed shingles, prevnar, flu vaccine --  Flu vaccine Colonoscopy - will refer today - due Mammogram  Up to date  Dexa   Up to date  Eye exams   Due - will schedule  EKG normal in 2013 Exercise - stressed regular exercise Weight - advised  Skin  - no concerns, consider full skin evaluation by derm Substance abuse  none  See Problem List for Assessment and Plan of chronic medical problems.  F/u annually

## 2016-06-27 NOTE — Assessment & Plan Note (Signed)
Check tsh  Titrate med dose if needed  

## 2016-06-28 LAB — HEPATITIS C ANTIBODY: HCV AB: NEGATIVE

## 2016-06-30 ENCOUNTER — Encounter: Payer: Self-pay | Admitting: Internal Medicine

## 2016-07-03 DIAGNOSIS — M25552 Pain in left hip: Secondary | ICD-10-CM | POA: Diagnosis not present

## 2016-07-04 ENCOUNTER — Encounter: Payer: Self-pay | Admitting: Internal Medicine

## 2016-07-09 ENCOUNTER — Ambulatory Visit: Payer: Self-pay | Admitting: Family Medicine

## 2016-07-17 DIAGNOSIS — M25552 Pain in left hip: Secondary | ICD-10-CM | POA: Diagnosis not present

## 2016-07-18 ENCOUNTER — Ambulatory Visit (INDEPENDENT_AMBULATORY_CARE_PROVIDER_SITE_OTHER): Payer: PPO

## 2016-07-18 DIAGNOSIS — Z23 Encounter for immunization: Secondary | ICD-10-CM | POA: Diagnosis not present

## 2016-07-30 ENCOUNTER — Ambulatory Visit (INDEPENDENT_AMBULATORY_CARE_PROVIDER_SITE_OTHER): Payer: PPO | Admitting: Psychiatry

## 2016-07-30 VITALS — BP 126/72 | HR 81 | Ht 63.0 in | Wt 194.6 lb

## 2016-07-30 DIAGNOSIS — Z801 Family history of malignant neoplasm of trachea, bronchus and lung: Secondary | ICD-10-CM | POA: Diagnosis not present

## 2016-07-30 DIAGNOSIS — Z8489 Family history of other specified conditions: Secondary | ICD-10-CM

## 2016-07-30 DIAGNOSIS — F411 Generalized anxiety disorder: Secondary | ICD-10-CM

## 2016-07-30 DIAGNOSIS — Z806 Family history of leukemia: Secondary | ICD-10-CM | POA: Diagnosis not present

## 2016-07-30 DIAGNOSIS — Z9889 Other specified postprocedural states: Secondary | ICD-10-CM

## 2016-07-30 DIAGNOSIS — Z79899 Other long term (current) drug therapy: Secondary | ICD-10-CM

## 2016-07-30 MED ORDER — VENLAFAXINE HCL ER 75 MG PO CP24: 150.0000 mg | ORAL_CAPSULE | Freq: Every day | ORAL | 5 refills | Status: DC

## 2016-07-30 NOTE — Progress Notes (Signed)
Patient ID: Paige [name], female   DOB: 1950-09-20, 65 y.o.   MRN: [id number] Patient ID: Paige [name], female   DOB: 02-22-1951, 65 y.o.   MRN: [id number] [facility] MD Progress Note  07/30/2016 3:36 PM NOVELLE RAWLINGS  MRN:  [id number] Subjective: Mild depression Principal Problem  At this time the patient is actually doing better. Her anxiety is less. She is sleeping and eating well. People are getting lumbar nerves less so. I think in general the patient has features of generalized anxiety disorder which includes irritability. The patient really is sleeping better. She has more energy. Work is going well. Her anxiety level is reduced. The patient denies use of alcohol or drugs and never has been psychotic. She did need to take any Klonopin. She tried taking once or twice but she gets sedated. I told to taking just when she's anxious. Overall the Effexor seems to be beneficial.  Past Psychiatric History:   Past Medical History:  Past Medical History:  Diagnosis Date  . ADD (attention deficit disorder)    Pt has hard time focusing  . Anxiety   . Arthritis   . Carpal tunnel syndrome   . Colon polyp   . Depression   . Gallstones   . GERD (gastroesophageal reflux disease)   . Hypertension   . Status post dilation of esophageal narrowing   . Thyroid disease     Past Surgical History:  Procedure Laterality Date  . ABDOMINAL HYSTERECTOMY    . CARPAL TUNNEL RELEASE Left   . CHOLECYSTECTOMY    . CLOSED REDUCTION HAND FRACTURE     right hand  . fx leg    . NASAL SINUS SURGERY    . TUBAL LIGATION     Family History:  Family History  Problem Relation Age of Onset  . Hyperlipidemia Mother   . Lung cancer Father   . Cancer Paternal Grandfather     mets  . Leukemia Maternal Aunt    Family Psychiatric  History:  Social History:  History  Alcohol Use No     History  Drug Use No    Social History   Social History  . Marital status: Married    Spouse name: N/A  .  Number of children: 2  . Years of education: N/A   Occupational History  . retired Systems analyst  . [place] History Main Topics  . Smoking status: Never Smoker  . Smokeless tobacco: Never Used  . Alcohol use No  . Drug use: No  . Sexual activity: No   Other Topics Concern  . Not on file   Social History Narrative   Works at [employer] - husband died of lung cancer      No regular exercise   Additional Social History:                         Sleep: Fair  Appetite:  Good  Current Medications: Current Outpatient Prescriptions  Medication Sig Dispense Refill  . bisoprolol-hydrochlorothiazide (ZIAC) 5-6.25 MG tablet TAKE ONE TABLET BY MOUTH ONCE DAILY 90 tablet 3  . clonazePAM (KLONOPIN) 0.5 MG tablet 1  qam 30 tablet 4  . Eszopiclone 3 MG TABS Take 1 tablet (3 mg total) by mouth at bedtime as needed. Take immediately before bedtime 30 tablet 3  . levothyroxine (SYNTHROID, LEVOTHROID) 125 MCG tablet Take 1 tablet (125 mcg total)  by mouth daily. 90 tablet 3  . venlafaxine XR (EFFEXOR XR) 75 MG 24 hr capsule Take 2 capsules (150 mg total) by mouth daily. 30 capsule 5   No current facility-administered medications for this visit.     Lab Results: No results found for this or any previous visit (from the past 48 hour(s)).  Blood Alcohol level:  No results found for: [facility]  Physical Findings: AIMS:  , ,  ,  ,    CIWA:    COWS:     Musculoskeletal: Strength & Muscle Tone: within normal limits Gait & Station: normal Patient leans: N/A  Psychiatric Specialty Exam: ROS  There were no vitals taken for this visit.There is no height or weight on file to calculate BMI.  General Appearance: Fairly Groomed  Engineer, water::  Good  Speech:  Clear and Coherent  Volume:  Normal  Mood:  Euthymic  Affect:  Blunt  Thought Process:  Coherent  Orientation:  Full (Time, Place, and Person)  Thought Content:  WDL   Suicidal Thoughts:  No  Homicidal Thoughts:  No  Memory:  NA  Judgement:  NA  Insight:  Good  Psychomotor Activity:  Decreased  Concentration:  Fair  Recall:  [employer] of Knowledge:Good  Language: Fair  Akathisia:  No  Handed:  Right  AIMS (if indicated):     Assets:  Desire for Improvement  ADL's:  Intact  Cognition: WNL  Sleep:      Treatment Plan Summary: 07/30/2016, 3:36 PM At this time the patient will continue taking 75 mg XL of Effexor every morning. She'll take Klonopin as needed. She is doing much better. She'll return to see me in 4 months.Marland Kitchen

## 2016-08-18 HISTORY — PX: COLONOSCOPY: SHX174

## 2016-09-01 ENCOUNTER — Ambulatory Visit (AMBULATORY_SURGERY_CENTER): Payer: Self-pay

## 2016-09-01 VITALS — Ht 62.0 in | Wt 196.2 lb

## 2016-09-01 DIAGNOSIS — Z860100 Personal history of colon polyps, unspecified: Secondary | ICD-10-CM

## 2016-09-01 MED ORDER — SUPREP BOWEL PREP KIT 17.5-3.13-1.6 GM/177ML PO SOLN: 1.0000 | Freq: Once | ORAL | 0 refills | Status: AC

## 2016-09-01 NOTE — Progress Notes (Signed)
No diet meds No home oxygen No allergies to eggs or soy No past problems with anesthesia except PONV with general  Declined emmi

## 2016-09-03 ENCOUNTER — Encounter: Payer: Self-pay | Admitting: Internal Medicine

## 2016-09-16 ENCOUNTER — Ambulatory Visit (AMBULATORY_SURGERY_CENTER): Payer: PPO | Admitting: Internal Medicine

## 2016-09-16 ENCOUNTER — Encounter: Payer: Self-pay | Admitting: Internal Medicine

## 2016-09-16 VITALS — BP 126/64 | HR 66 | Temp 97.3°F | Resp 14 | Ht 62.0 in | Wt 196.0 lb

## 2016-09-16 DIAGNOSIS — Z86010 Personal history of colonic polyps: Secondary | ICD-10-CM | POA: Diagnosis not present

## 2016-09-16 DIAGNOSIS — Z800 Family history of malignant neoplasm of digestive organs: Secondary | ICD-10-CM

## 2016-09-16 DIAGNOSIS — I10 Essential (primary) hypertension: Secondary | ICD-10-CM | POA: Diagnosis not present

## 2016-09-16 DIAGNOSIS — G4733 Obstructive sleep apnea (adult) (pediatric): Secondary | ICD-10-CM | POA: Diagnosis not present

## 2016-09-16 DIAGNOSIS — E039 Hypothyroidism, unspecified: Secondary | ICD-10-CM | POA: Diagnosis not present

## 2016-09-16 DIAGNOSIS — F419 Anxiety disorder, unspecified: Secondary | ICD-10-CM | POA: Diagnosis not present

## 2016-09-16 MED ORDER — SODIUM CHLORIDE 0.9 % IV SOLN
500.0000 mL | INTRAVENOUS | Status: DC
Start: 2016-09-16 — End: 2017-07-03

## 2016-09-16 NOTE — Progress Notes (Signed)
Report to PACU, RN, vss, BBS= Clear.  

## 2016-09-16 NOTE — Op Note (Signed)
[facility] Patient Name: Paige [name] Procedure Date: 09/16/2016 11:23 AM MRN: [id number] Endoscopist: Docia Chuck. Henrene Pastor , MD Age: 66 Referring MD:  Date of Birth: 04-Sep-1950 Gender: Female Account #: [id number] Procedure:                Colonoscopy Indications:              High risk colon cancer surveillance: Personal                            history of adenoma (10 mm or greater in size).                            Index exam August 2013 with 15 mm tubular adenoma.                            Mother with colon cancer at 59 Medicines:                Monitored Anesthesia Care Procedure:                Pre-Anesthesia Assessment:                           - Prior to the procedure, a History and Physical                            was performed, and patient medications and                            allergies were reviewed. The patient's tolerance of                            previous anesthesia was also reviewed. The risks                            and benefits of the procedure and the sedation                            options and risks were discussed with the patient.                            All questions were answered, and informed consent                            was obtained. Prior Anticoagulants: The patient has                            taken no previous anticoagulant or antiplatelet                            agents. ASA Grade Assessment: II - A patient with                            mild systemic disease. After reviewing the risks  and benefits, the patient was deemed in                            satisfactory condition to undergo the procedure.                           After obtaining informed consent, the colonoscope                            was passed under direct vision. Throughout the                            procedure, the patient's blood pressure, pulse, and                            oxygen saturations were monitored  continuously. The                            Model CF-HQ190L [id number]) scope was introduced                            through the anus and advanced to the the cecum,                            identified by appendiceal orifice and ileocecal                            valve. The ileocecal valve, appendiceal orifice,                            and rectum were photographed. The quality of the                            bowel preparation was excellent. The colonoscopy                            was performed without difficulty. The patient                            tolerated the procedure well. The bowel preparation                            used was SUPREP. Scope In: 11:34:05 AM Scope Out: 11:46:33 AM Scope Withdrawal Time: 0 hours 9 minutes 55 seconds  Total Procedure Duration: 0 hours 12 minutes 28 seconds  Findings:                 Internal hemorrhoids were found during retroflexion.                           The entire examined colon appeared normal on direct                            and retroflexion views. Complications:  No immediate complications. Estimated blood loss:                            None. Estimated Blood Loss:     Estimated blood loss: none. Impression:               - Internal hemorrhoids.                           - The entire examined colon is normal on direct and                            retroflexion views.                           - No specimens collected. Recommendation:           - Repeat colonoscopy in 5 years for surveillance                            (history of advanced adenoma).                           - Patient has a contact number available for                            emergencies. The signs and symptoms of potential                            delayed complications were discussed with the                            patient. Return to normal activities tomorrow.                            Written discharge instructions were  provided to the                            patient.                           - Resume previous diet.                           - Continue present medications. Docia Chuck. Henrene Pastor, MD 09/16/2016 11:52:10 AM This report has been signed electronically.

## 2016-09-16 NOTE — Patient Instructions (Signed)
YOU HAD AN ENDOSCOPIC PROCEDURE TODAY AT THE [facility]:   Refer to the procedure report that was given to you for any specific questions about what was found during the examination.  If the procedure report does not answer your questions, please call your gastroenterologist to clarify.  If you requested that your care partner not be given the details of your procedure findings, then the procedure report has been included in a sealed envelope for you to review at your convenience later.  YOU SHOULD EXPECT: Some feelings of bloating in the abdomen. Passage of more gas than usual.  Walking can help get rid of the air that was put into your GI tract during the procedure and reduce the bloating. If you had a lower endoscopy (such as a colonoscopy or flexible sigmoidoscopy) you may notice spotting of blood in your stool or on the toilet paper. If you underwent a bowel prep for your procedure, you may not have a normal bowel movement for a few days.  Please Note:  You might notice some irritation and congestion in your nose or some drainage.  This is from the oxygen used during your procedure.  There is no need for concern and it should clear up in a day or so.  SYMPTOMS TO REPORT IMMEDIATELY:   Following lower endoscopy (colonoscopy or flexible sigmoidoscopy):  Excessive amounts of blood in the stool  Significant tenderness or worsening of abdominal pains  Swelling of the abdomen that is new, acute  Fever of 100F or higher  For urgent or emergent issues, a gastroenterologist can be reached at any hour by calling [phone].   DIET:  We do recommend a small meal at first, but then you may proceed to your regular diet.  Drink plenty of fluids but you should avoid alcoholic beverages for 24 hours.  ACTIVITY:  You should plan to take it easy for the rest of today and you should NOT DRIVE or use heavy machinery until tomorrow (because of the sedation medicines used during the test).     FOLLOW UP: Our staff will call the number listed on your records the next business day following your procedure to check on you and address any questions or concerns that you may have regarding the information given to you following your procedure. If we do not reach you, we will leave a message.  However, if you are feeling well and you are not experiencing any problems, there is no need to return our call.  We will assume that you have returned to your regular daily activities without incident.  If any biopsies were taken you will be contacted by phone or by letter within the next 1-3 weeks.  Please call us at [phone] if you have not heard about the biopsies in 3 weeks.    SIGNATURES/CONFIDENTIALITY: You and/or your care partner have signed paperwork which will be entered into your electronic medical record.  These signatures attest to the fact that that the information above on your After Visit Summary has been reviewed and is understood.  Full responsibility of the confidentiality of this discharge information lies with you and/or your care-partner. 

## 2016-09-17 ENCOUNTER — Telehealth: Payer: Self-pay

## 2016-09-17 NOTE — Telephone Encounter (Signed)
  Follow up Call-  Call back number 09/16/2016  Post procedure Call Back phone  # [phone]  Permission to leave phone message Yes  Some recent data might be hidden     Patient questions:  Do you have a fever, pain , or abdominal swelling? No. Pain Score  0 *  Have you tolerated food without any problems? Yes.    Have you been able to return to your normal activities? Yes.    Do you have any questions about your discharge instructions: Diet   No. Medications  No. Follow up visit  No.  Do you have questions or concerns about your Care? No.  Actions: * If pain score is 4 or above: No action needed, pain <4.

## 2016-10-16 ENCOUNTER — Other Ambulatory Visit (HOSPITAL_COMMUNITY): Payer: Self-pay | Admitting: Psychiatry

## 2016-10-17 ENCOUNTER — Other Ambulatory Visit (HOSPITAL_COMMUNITY): Payer: Self-pay

## 2016-10-17 MED ORDER — ESZOPICLONE 3 MG PO TABS: 3.0000 mg | ORAL_TABLET | Freq: Every evening | ORAL | 0 refills | Status: DC | PRN

## 2016-11-18 ENCOUNTER — Other Ambulatory Visit (HOSPITAL_COMMUNITY): Payer: Self-pay | Admitting: Psychiatry

## 2016-11-19 ENCOUNTER — Other Ambulatory Visit (HOSPITAL_COMMUNITY): Payer: Self-pay | Admitting: Psychiatry

## 2016-11-21 ENCOUNTER — Other Ambulatory Visit (HOSPITAL_COMMUNITY): Payer: Self-pay

## 2016-11-21 MED ORDER — ESZOPICLONE 3 MG PO TABS: 3.0000 mg | ORAL_TABLET | Freq: Every evening | ORAL | 0 refills | Status: DC | PRN

## 2016-12-03 ENCOUNTER — Ambulatory Visit (INDEPENDENT_AMBULATORY_CARE_PROVIDER_SITE_OTHER): Payer: PPO | Admitting: Psychiatry

## 2016-12-03 DIAGNOSIS — F411 Generalized anxiety disorder: Secondary | ICD-10-CM | POA: Diagnosis not present

## 2016-12-03 DIAGNOSIS — Z79899 Other long term (current) drug therapy: Secondary | ICD-10-CM | POA: Diagnosis not present

## 2016-12-03 MED ORDER — VENLAFAXINE HCL ER 150 MG PO CP24: 150.0000 mg | ORAL_CAPSULE | Freq: Every day | ORAL | 5 refills | Status: DC

## 2016-12-03 MED ORDER — ESZOPICLONE 3 MG PO TABS: 3.0000 mg | ORAL_TABLET | Freq: Every evening | ORAL | 5 refills | Status: DC | PRN

## 2016-12-03 NOTE — Progress Notes (Signed)
Patient ID: Paige [name], female   DOB: [birth year], 66 y.o.   MRN: [id number] Patient ID: Paige [name], female   DOB: 12-31-1950, 66 y.o.   MRN: [id number] [facility] MD Progress Note  12/03/2016 4:15 PM Paige [name]  MRN:  [id number] Subjective: Mild depression Principal Probl Today the patient is seen on time.patient says she is a little bit better she says since being on the Effexor she feels somewhat calmer .  Interestingly she is not taking the full dose.  She is only taking 75 mg.  She could not tolerate any she doesn't.  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  She likes her job and the people she will.  She likes her boss.-Please a secure job.  Unfortunately the patient has significant financial problems.  She is in the middle of bankruptcy.  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  Overall she is sleeping and eating well.  He get good energy.  She gets off work and she is bored and tired and she does watches TV.  He doesn't read.  She no longer has an animal because she can't afford it.  She denies use of alcohol or drugs.  Patient denies any chest pain and shortness of breath or any neurological symptoms.  The patient sleeps well as long she takes 3 mg of Lunesta.    Pa time.st Medical History:  Past Medical History:  Diagnosis Date  . ADD (attention deficit disorder)    Pt has hard time focusing  . Anxiety   . Arthritis   . Carpal tunnel syndrome   . Colon polyp   . Constipation   . Depression   . Gallstones   . GERD (gastroesophageal reflux disease)   . Hypertension   . Status post dilation of esophageal narrowing   . Thyroid disease     Past Surgical History:  Procedure Laterality Date  . ABDOMINAL HYSTERECTOMY    . CARPAL TUNNEL RELEASE Left   . CHOLECYSTECTOMY    . CLOSED REDUCTION HAND FRACTURE     right hand  . fx leg    . NASAL SINUS SURGERY    . SHOULDER SURGERY     reattac tendon; right shoulder  .  TIBIA FRACTURE SURGERY     steel rod right leg  . TUBAL LIGATION     Family History:  Family History  Problem Relation Age of Onset  . Hyperlipidemia Mother   . Colon cancer Mother     65  . Lung cancer Father   . Cancer Paternal Grandfather     mets  . Leukemia Maternal Aunt    Family Psychiatric  History:  Social History:  History  Alcohol Use No     History  Drug Use No    Social History   Social History  . Marital status: Married    Spouse name: N/A  . Number of children: 2  . Years of education: N/A   Occupational History  . retired Systems analyst  . [place] History Main Topics  . Smoking status: Never Smoker  . Smokeless tobacco: Never Used  . Alcohol use No  . Drug use: No  . Sexual activity: No   Other Topics Concern  . Not on file   Social History Narrative   Works at [employer] - husband died of lung cancer  No regular exercise   Additional Social History:                         Sleep: Fair  Appetite:  Good  Current Medications: Current Outpatient Prescriptions  Medication Sig Dispense Refill  . bisoprolol-hydrochlorothiazide (ZIAC) 5-6.25 MG tablet TAKE ONE TABLET BY MOUTH ONCE DAILY 90 tablet 3  . Eszopiclone 3 MG TABS Take 1 tablet (3 mg total) by mouth at bedtime as needed. Take immediately before bedtime 30 tablet 5  . levothyroxine (SYNTHROID, LEVOTHROID) 125 MCG tablet Take 1 tablet (125 mcg total) by mouth daily. 90 tablet 3  . NON FORMULARY EQUATE STOOL SOFTNER PLUS STIMULANT LAXATIVE    . TURMERIC PO Take 500 mg by mouth.    . venlafaxine XR (EFFEXOR-XR) 150 MG 24 hr capsule Take 1 capsule (150 mg total) by mouth daily. 30 capsule 5   Current Facility-Administered Medications  Medication Dose Route Frequency Provider Last Rate Last Dose  . 0.9 %  sodium chloride infusion  500 mL Intravenous Continuous Irene Shipper, MD        Lab Results: No results  found for this or any previous visit (from the past 48 hour(s)).  Blood Alcohol level:  No results found for: [facility]  Physical Findings: AIMS:  , ,  ,  ,    CIWA:    COWS:     Musculoskeletal: Strength & Muscle Tone: within normal limits Gait & Station: normal Patient leans: N/A  Psychiatric Specialty Exam: ROS  There were no vitals taken for this visit.There is no height or weight on file to calculate BMI.  General Appearance: Fairly Groomed  Engineer, water::  Good  Speech:  Clear and Coherent  Volume:  Normal  Mood:  Euthymic  Affect:  Blunt  Thought Process:  Coherent  Orientation:  Full (Time, Place, and Person)  Thought Content:  WDL  Suicidal Thoughts:  No  Homicidal Thoughts:  No  Memory:  NA  Judgement:  NA  Insight:  Good  Psychomotor Activity:  Decreased  Concentration:  Fair  Recall:  [employer] of Knowledge:Good  Language: Fair  Akathisia:  No  Handed:  Right  AIMS (if indicated):     Assets:  Desire for Improvement  ADL's:  Intact  Cognition: WNL  Sleep:      Treatment Plan Summary: 12/03/2016, 4:15 PM At this time the patient is doing fairly well.  We will go ahead and increase her Effexor to the full dose of 150 mg.  We'll continue Lunesta 3 mg she'll return to see me in 2 months for reevaluation.  The patient is not suicidal.  She is functioning actually quite well.  He does experience results a chronic worrier and she does get irritable at regular time.  She is little other clear evidence of generalized anxiety disorder.

## 2016-12-25 ENCOUNTER — Other Ambulatory Visit: Payer: Self-pay | Admitting: Internal Medicine

## 2017-01-18 ENCOUNTER — Other Ambulatory Visit (HOSPITAL_COMMUNITY): Payer: Self-pay | Admitting: Psychiatry

## 2017-01-21 ENCOUNTER — Telehealth (HOSPITAL_COMMUNITY): Payer: Self-pay

## 2017-01-21 NOTE — Telephone Encounter (Signed)
Medication management - Telephone call with patient to follow up on refill request for Lunesta as patient was given a new order 12/03/16 + 5 refills. Pt. reported taking Rx to [facility], returned next day and they lost prescription. [place] to verify they did not have this order and then discussed with Dr. Casimiro Needle as he authorized this nurse to call in order from 12/03/16.  [place] back on [place] and left order verification from 12/03/16 on their prescription voice line per attendants request as verbally authorized by Dr. Casimiro Needle.  Called patient back and left her a message to inform this order was called into her pharmacy.

## 2017-01-22 ENCOUNTER — Ambulatory Visit (INDEPENDENT_AMBULATORY_CARE_PROVIDER_SITE_OTHER): Payer: PPO | Admitting: Psychiatry

## 2017-01-22 ENCOUNTER — Other Ambulatory Visit (HOSPITAL_COMMUNITY): Payer: Self-pay | Admitting: Psychiatry

## 2017-01-22 VITALS — BP 130/81 | HR 95 | Ht 61.0 in | Wt 196.0 lb

## 2017-01-22 DIAGNOSIS — F320 Major depressive disorder, single episode, mild: Secondary | ICD-10-CM | POA: Diagnosis not present

## 2017-01-22 DIAGNOSIS — F341 Dysthymic disorder: Secondary | ICD-10-CM

## 2017-01-22 DIAGNOSIS — Z79899 Other long term (current) drug therapy: Secondary | ICD-10-CM | POA: Diagnosis not present

## 2017-01-22 MED ORDER — ESZOPICLONE 3 MG PO TABS: 3.0000 mg | ORAL_TABLET | Freq: Every evening | ORAL | 5 refills | Status: DC | PRN

## 2017-01-22 MED ORDER — VENLAFAXINE HCL ER 150 MG PO CP24: 150.0000 mg | ORAL_CAPSULE | Freq: Every day | ORAL | 5 refills | Status: DC

## 2017-01-22 NOTE — Telephone Encounter (Signed)
Mediacation management - Telephone call with [place] after pt. left a message they were still reporting not having her Lunesta order.  [place] to question problem and again gave order from 12/03/16. Called pt. to inform as she reported increased problems managing mood and requested an earlier appointment.  Patient stated she could be seen this evening if any appointments available and worked in at 1pm to see Dr. Casimiro Needle and to cancel currently scheduled appointment for 02/04/17.

## 2017-01-22 NOTE — Progress Notes (Signed)
Patient ID: Paige [name], female   DOB: [birth year], 66 y.o.   MRN: [id number] Patient ID: Paige [name], female   DOB: July 18, 1951, 66 y.o.   MRN: [id number] [facility] MD Progress Note  01/22/2017 1:39 PM Paige [name]  MRN:  [id number] Subjective: Mild depression Principal Probl Today the patient is doing fair. Last night she had intense dream didn't sleep well and actually called in sick at work. She rarely does this. I suspect it might of had something to coming to our visit. There are problems around her Lunesta. She said that she turned it into the pharmacy that the pharmacy said he didn't have it the next day. This patient has no evidence of ever misusing or abusing medications. She doesn't drink alcohol and uses no drugs. This is a technical air or prescription has been lost either by the patient of the pharmacist. Overall the patient's mood is fairly good. The closer evaluation he do not think she has generalized anxiety disorder. I think she has anxiety that's related to the circumstances. I also think she has a mild depression. She claims the Effexor at a higher dose has helped. She feels calmer and more in control of her emotions. She feels somewhat less irritable. Generally she sleeping and eating well. She's got good energy. She works all the time. She works on the weekends and will continue to do so for about a year from now when her bankruptcies over. She clearly is financial distressed. Once her finances are better in about a year I think 12 more time more energy more money to do some things for enjoyment. The patient is not psychotic. She is not suicidal. She actually functions quite well. 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 I believe her to be very reliable consistent responsible.  Pa time.st Medical History:  Past Medical History:  Diagnosis Date  . ADD (attention deficit disorder)    Pt has hard time  focusing  . Anxiety   . Arthritis   . Carpal tunnel syndrome   . Colon polyp   . Constipation   . Depression   . Gallstones   . GERD (gastroesophageal reflux disease)   . Hypertension   . Status post dilation of esophageal narrowing   . Thyroid disease     Past Surgical History:  Procedure Laterality Date  . ABDOMINAL HYSTERECTOMY    . CARPAL TUNNEL RELEASE Left   . CHOLECYSTECTOMY    . CLOSED REDUCTION HAND FRACTURE     right hand  . fx leg    . NASAL SINUS SURGERY    . SHOULDER SURGERY     reattac tendon; right shoulder  . TIBIA FRACTURE SURGERY     steel rod right leg  . TUBAL LIGATION     Family History:  Family History  Problem Relation Age of Onset  . Hyperlipidemia Mother   . Colon cancer Mother        74  . Lung cancer Father   . Cancer Paternal Grandfather        mets  . Leukemia Maternal Aunt    Family Psychiatric  History:  Social History:  History  Alcohol Use No     History  Drug Use No    Social History   Social History  . Marital status: Married    Spouse name: N/A  . Number of children: 2  . Years of  education: N/A   Occupational History  . retired Systems analyst  . [place] History Main Topics  . Smoking status: Never Smoker  . Smokeless tobacco: Never Used  . Alcohol use No  . Drug use: No  . Sexual activity: No   Other Topics Concern  . Not on file   Social History Narrative   Works at [employer] - husband died of lung cancer      No regular exercise   Additional Social History:                         Sleep: Fair  Appetite:  Good  Current Medications: Current Outpatient Prescriptions  Medication Sig Dispense Refill  . bisoprolol-hydrochlorothiazide (ZIAC) 5-6.25 MG tablet TAKE ONE TABLET BY MOUTH ONCE DAILY 90 tablet 3  . Eszopiclone 3 MG TABS Take 1 tablet (3 mg total) by mouth at bedtime as needed. Take immediately before bedtime 30 tablet 5   . levothyroxine (SYNTHROID, LEVOTHROID) 125 MCG tablet TAKE ONE TABLET BY MOUTH ONCE DAILY 90 tablet 1  . NON FORMULARY EQUATE STOOL SOFTNER PLUS STIMULANT LAXATIVE    . TURMERIC PO Take 500 mg by mouth.    . venlafaxine XR (EFFEXOR-XR) 150 MG 24 hr capsule Take 1 capsule (150 mg total) by mouth daily. 30 capsule 5   Current Facility-Administered Medications  Medication Dose Route Frequency Provider Last Rate Last Dose  . 0.9 %  sodium chloride infusion  500 mL Intravenous Continuous Irene Shipper, MD        Lab Results: No results found for this or any previous visit (from the past 48 hour(s)).  Blood Alcohol level:  No results found for: [facility]  Physical Findings: AIMS:  , ,  ,  ,    CIWA:    COWS:     Musculoskeletal: Strength & Muscle Tone: within normal limits Gait & Station: normal Patient leans: N/A  Psychiatric Specialty Exam: ROS  Blood pressure 130/81, pulse 95, height 5\' 1"  (1.549 m), weight 196 lb (88.9 kg).Body mass index is 37.03 kg/m.  General Appearance: Fairly Groomed  Engineer, water::  Good  Speech:  Clear and Coherent  Volume:  Normal  Mood:  Euthymic  Affect:  Blunt  Thought Process:  Coherent  Orientation:  Full (Time, Place, and Person)  Thought Content:  WDL  Suicidal Thoughts:  No  Homicidal Thoughts:  No  Memory:  NA  Judgement:  NA  Insight:  Good  Psychomotor Activity:  Decreased  Concentration:  Fair  Recall:  [employer] of Knowledge:Good  Language: Fair  Akathisia:  No  Handed:  Right  AIMS (if indicated):     Assets:  Desire for Improvement  ADL's:  Intact  Cognition: WNL  Sleep:      Treatment Plan Summary: 01/22/2017, 1:39 PM Today the patient is doing fairly well. She can state that the higher dose of Effexor is better than the lower dose. She's a difficult time articulating exactly improvement but I believe her. She had a difficult cream last night. Essentially she dreamed of running away feeling trapped feeling a lot of anxiety  and feeling a she was in a great deal of financial stress. All of these thoughts and emotions that she had as part of her dream are actually reality during her life during the day. We try to talk about it to express that depresses  a lot of feelings and that they likely come out at night dreams. She seemed to understand that concept. Nonetheless the patient is pleased taking Effexor and Lunesta at night helps her sleep. Overall she is functioning fairly well. She's not suicidal. She denies chest pain shortness of breath or any neurological symptoms at this time. She returned to see me in 5 months.

## 2017-02-04 ENCOUNTER — Ambulatory Visit (HOSPITAL_COMMUNITY): Payer: Self-pay | Admitting: Psychiatry

## 2017-02-06 ENCOUNTER — Other Ambulatory Visit: Payer: Self-pay | Admitting: Internal Medicine

## 2017-06-23 ENCOUNTER — Encounter (HOSPITAL_COMMUNITY): Payer: Self-pay

## 2017-06-24 ENCOUNTER — Ambulatory Visit (INDEPENDENT_AMBULATORY_CARE_PROVIDER_SITE_OTHER): Payer: PPO | Admitting: Psychiatry

## 2017-06-24 VITALS — BP 118/68 | HR 73 | Ht 62.0 in | Wt 194.8 lb

## 2017-06-24 DIAGNOSIS — F341 Dysthymic disorder: Secondary | ICD-10-CM

## 2017-06-24 DIAGNOSIS — Z560 Unemployment, unspecified: Secondary | ICD-10-CM | POA: Diagnosis not present

## 2017-06-24 MED ORDER — VENLAFAXINE HCL ER 150 MG PO CP24: 150.0000 mg | ORAL_CAPSULE | Freq: Every day | ORAL | 5 refills | Status: DC

## 2017-06-24 MED ORDER — ESZOPICLONE 3 MG PO TABS: 3.0000 mg | ORAL_TABLET | Freq: Every evening | ORAL | 5 refills | Status: DC | PRN

## 2017-06-24 NOTE — Progress Notes (Signed)
Patient ID: Paige [name], female   DOB: 1951/07/24, 66 y.o.   MRN: [id number] Patient ID: Paige [name], female   DOB: 1950-11-26, 66 y.o.   MRN: [id number] [facility] MD Progress Note  06/24/2017 4:17 PM Paige [name]  MRN:  [id number] Subjective: Mild depression Principal Probl Today the patient is doing fairly well. She is at her baseline. 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 She is financially she is here. She takes her Effexor regular says is helpful. She takes Lunesta at night and it is helpful.Taking her medicines she sleeping and eating well has good energy. She has to see her primary care doctor for yearly exam is no physical complaints. She denies chest pain or should shortness of breath. Patient denies use of alcohol or drugs. She doesn't like her home that she is actually doesn't feel very engaging many people. She is very cheerful and her visit. The patient is very reliable and consistent Insurance underwriter. She still enjoys life.. Overall she's doing well. I believe she is at her baseline. Believe she has a chronic dysthymic disorder response to Effexor.  Pa time.st Medical History:  Past Medical History:  Diagnosis Date  . ADD (attention deficit disorder)    Pt has hard time focusing  . Anxiety   . Arthritis   . Carpal tunnel syndrome   . Colon polyp   . Constipation   . Depression   . Gallstones   . GERD (gastroesophageal reflux disease)   . Hypertension   . Status post dilation of esophageal narrowing   . Thyroid disease     Past Surgical History:  Procedure Laterality Date  . ABDOMINAL HYSTERECTOMY    . CARPAL TUNNEL RELEASE Left   . CHOLECYSTECTOMY    . CLOSED REDUCTION HAND FRACTURE     right hand  . fx leg    . NASAL SINUS SURGERY    . SHOULDER SURGERY     reattac tendon; right shoulder  . TIBIA FRACTURE SURGERY     steel rod right leg  . TUBAL LIGATION     Family History:  Family History  Problem Relation Age of Onset  . Hyperlipidemia Mother    . Colon cancer Mother        40  . Lung cancer Father   . Cancer Paternal Grandfather        mets  . Leukemia Maternal Aunt    Family Psychiatric  History:  Social History:  Social History   Substance and Sexual Activity  Alcohol Use No     Social History   Substance and Sexual Activity  Drug Use No    Social History   Socioeconomic History  . Marital status: Married    Spouse name: Not on file  . Number of children: 2  . Years of education: Not on file  . Highest education level: Not on file  Social Needs  . Financial resource strain: Not on file  . Food insecurity - worry: Not on file  . Food insecurity - inability: Not on file  . Transportation needs - medical: Not on file  . Transportation needs - non-medical: Not on file  Occupational History  . Occupation: retired    Fish farm manager: UNEMPLOYED  . Occupation: Conservation officer, historic buildings: [employer]  Tobacco Use  . Smoking status: Never Smoker  . Smokeless tobacco: Never Used  Substance and Sexual Activity  . Alcohol use: No  . Drug use: No  .  Sexual activity: No  Other Topics Concern  . Not on file  Social History Narrative   Works at [employer] - husband died of lung cancer      No regular exercise   Additional Social History:                         Sleep: Fair  Appetite:  Good  Current Medications: Current Outpatient Medications  Medication Sig Dispense Refill  . bisoprolol-hydrochlorothiazide (ZIAC) 5-6.25 MG tablet TAKE ONE TABLET BY MOUTH ONCE DAILY 90 tablet 1  . Eszopiclone 3 MG TABS Take 1 tablet (3 mg total) at bedtime as needed by mouth. Take immediately before bedtime 30 tablet 5  . levothyroxine (SYNTHROID, LEVOTHROID) 125 MCG tablet TAKE ONE TABLET BY MOUTH ONCE DAILY 90 tablet 1  . NON FORMULARY EQUATE STOOL SOFTNER PLUS STIMULANT LAXATIVE    . TURMERIC PO Take 500 mg by mouth.    . venlafaxine XR (EFFEXOR-XR) 150 MG 24 hr capsule Take 1  capsule (150 mg total) daily by mouth. 30 capsule 5   Current Facility-Administered Medications  Medication Dose Route Frequency Provider Last Rate Last Dose  . 0.9 %  sodium chloride infusion  500 mL Intravenous Continuous Irene Shipper, MD        Lab Results: No results found for this or any previous visit (from the past 48 hour(s)).  Blood Alcohol level:  No results found for: [facility]  Physical Findings: AIMS:  , ,  ,  ,    CIWA:    COWS:     Musculoskeletal: Strength & Muscle Tone: within normal limits Gait & Station: normal Patient leans: N/A  Psychiatric Specialty Exam: ROS  Blood pressure 118/68, pulse 73, height 5\' 2"  (1.575 m), weight 194 lb 12.8 oz (88.4 kg).Body mass index is 35.63 kg/m.  General Appearance: Fairly Groomed  Engineer, water::  Good  Speech:  Clear and Coherent  Volume:  Normal  Mood:  Euthymic  Affect:  Blunt  Thought Process:  Coherent  Orientation:  Full (Time, Place, and Person)  Thought Content:  WDL  Suicidal Thoughts:  No  Homicidal Thoughts:  No  Memory:  NA  Judgement:  NA  Insight:  Good  Psychomotor Activity:  Decreased  Concentration:  Fair  Recall:  [employer] of Knowledge:Good  Language: Fair  Akathisia:  No  Handed:  Right  AIMS (if indicated):     Assets:  Desire for Improvement  ADL's:  Intact  Cognition: WNL  Sleep:      Treatment Plan Summary: 06/24/2017, 4:17 PM   At this time the patient is doing well. She works hard as he relationships with other people outside of work. She actually likes the people at work and likes going to work. 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 She just simply have to continue working for the next year at both of these jobs to get financially more stable. The patient continue taking Effexor 150 mg X are each morning continue taking 3 mg Lunesta. Patient is not suicidal. She is actually functioning very well. Her one and only problems dysthymic  disorder which is now well treated with antidepressant and her sleeping aid. She'll return to see me in5 months for a 30 minute reevaluation.

## 2017-06-27 ENCOUNTER — Other Ambulatory Visit: Payer: Self-pay | Admitting: Internal Medicine

## 2017-06-29 ENCOUNTER — Encounter: Payer: Self-pay | Admitting: Internal Medicine

## 2017-07-03 ENCOUNTER — Other Ambulatory Visit (INDEPENDENT_AMBULATORY_CARE_PROVIDER_SITE_OTHER): Payer: PPO

## 2017-07-03 ENCOUNTER — Encounter: Payer: Self-pay | Admitting: Internal Medicine

## 2017-07-03 ENCOUNTER — Ambulatory Visit (INDEPENDENT_AMBULATORY_CARE_PROVIDER_SITE_OTHER): Payer: PPO | Admitting: Internal Medicine

## 2017-07-03 VITALS — BP 104/70 | HR 73 | Temp 98.1°F | Resp 16 | Ht 62.0 in | Wt 196.0 lb

## 2017-07-03 DIAGNOSIS — Z0000 Encounter for general adult medical examination without abnormal findings: Secondary | ICD-10-CM

## 2017-07-03 DIAGNOSIS — F339 Major depressive disorder, recurrent, unspecified: Secondary | ICD-10-CM | POA: Diagnosis not present

## 2017-07-03 DIAGNOSIS — F329 Major depressive disorder, single episode, unspecified: Secondary | ICD-10-CM

## 2017-07-03 DIAGNOSIS — I10 Essential (primary) hypertension: Secondary | ICD-10-CM

## 2017-07-03 DIAGNOSIS — F32A Depression, unspecified: Secondary | ICD-10-CM

## 2017-07-03 DIAGNOSIS — Z23 Encounter for immunization: Secondary | ICD-10-CM

## 2017-07-03 DIAGNOSIS — E039 Hypothyroidism, unspecified: Secondary | ICD-10-CM

## 2017-07-03 DIAGNOSIS — F411 Generalized anxiety disorder: Secondary | ICD-10-CM

## 2017-07-03 LAB — CBC WITH DIFFERENTIAL/PLATELET
Basophils Absolute: 0 10*3/uL (ref 0.0–0.1)
Basophils Relative: 0.5 % (ref 0.0–3.0)
EOS PCT: 1.2 % (ref 0.0–5.0)
Eosinophils Absolute: 0.1 10*3/uL (ref 0.0–0.7)
HCT: 37.8 % (ref 36.0–46.0)
Hemoglobin: 12.3 g/dL (ref 12.0–15.0)
LYMPHS PCT: 30 % (ref 12.0–46.0)
Lymphs Abs: 2.1 10*3/uL (ref 0.7–4.0)
MCHC: 32.7 g/dL (ref 30.0–36.0)
MCV: 83.3 fl (ref 78.0–100.0)
MONO ABS: 0.5 10*3/uL (ref 0.1–1.0)
MONOS PCT: 7.7 % (ref 3.0–12.0)
NEUTROS PCT: 60.6 % (ref 43.0–77.0)
Neutro Abs: 4.2 10*3/uL (ref 1.4–7.7)
PLATELETS: 212 10*3/uL (ref 150.0–400.0)
RBC: 4.53 Mil/uL (ref 3.87–5.11)
RDW: 13.6 % (ref 11.5–15.5)
WBC: 7 10*3/uL (ref 4.0–10.5)

## 2017-07-03 LAB — COMPREHENSIVE METABOLIC PANEL
ALBUMIN: 4.4 g/dL (ref 3.5–5.2)
ALK PHOS: 89 U/L (ref 39–117)
ALT: 6 U/L (ref 0–35)
AST: 18 U/L (ref 0–37)
BUN: 25 mg/dL — ABNORMAL HIGH (ref 6–23)
CALCIUM: 10 mg/dL (ref 8.4–10.5)
CO2: 32 mEq/L (ref 19–32)
Chloride: 101 mEq/L (ref 96–112)
Creatinine, Ser: 0.79 mg/dL (ref 0.40–1.20)
GFR: 77.07 mL/min (ref >60.00)
Glucose, Bld: 105 mg/dL — ABNORMAL HIGH (ref 70–99)
POTASSIUM: 3.7 meq/L (ref 3.5–5.1)
Sodium: 142 mEq/L (ref 135–145)
TOTAL PROTEIN: 7.6 g/dL (ref 6.0–8.3)
Total Bilirubin: 0.3 mg/dL (ref 0.2–1.2)

## 2017-07-03 LAB — LIPID PANEL
Cholesterol: 242 mg/dL — ABNORMAL HIGH (ref 0–200)
HDL: 56 mg/dL (ref >39.00)
NonHDL: 186.34
TRIGLYCERIDES: 238 mg/dL — AB (ref 0.0–149.0)
Total CHOL/HDL Ratio: 4
VLDL: 47.6 mg/dL — AB (ref 0.0–40.0)

## 2017-07-03 LAB — LDL CHOLESTEROL, DIRECT: LDL DIRECT: 148 mg/dL

## 2017-07-03 LAB — TSH: TSH: 0.34 u[IU]/mL — AB (ref 0.35–4.50)

## 2017-07-03 NOTE — Patient Instructions (Addendum)
Call and schedule your mammogram -  [facility] [place] 7 a.m.-6:30 p.m., Monday 7 a.m.-5 p.m., Tuesday-Friday Schedule an appointment by calling [phone]  Take 1000-2000 units of vitamin D daily.  Start B complex vitamin daily to see if that helps with your energy level or vitamin B 12 1000 mcg daily.    You can try taking a probiotic - align or florastar are two good brands.    Test(s) ordered today. Your results will be released to [place] (or called to you) after review, usually within 72hours after test completion. If any changes need to be made, you will be notified at that same time.  All other Health Maintenance issues reviewed.   All recommended immunizations and age-appropriate screenings are up-to-date or discussed.  Flu immunization administered today.   Medications reviewed and updated.  No changes recommended at this time.   Please followup in one year for a physical   Health Maintenance, Female Adopting a healthy lifestyle and getting preventive care can go a long way to promote health and wellness. Talk with your health care provider about what schedule of regular examinations is right for you. This is a good chance for you to check in with your provider about disease prevention and staying healthy. In between checkups, there are plenty of things you can do on your own. Experts have done a lot of research about which lifestyle changes and preventive measures are most likely to keep you healthy. Ask your health care provider for more information. Weight and diet Eat a healthy diet  Be sure to include plenty of vegetables, fruits, low-fat dairy products, and lean protein.  Do not eat a lot of foods high in solid fats, added sugars, or salt.  Get regular exercise. This is one of the most important things you can do for your health. ? Most adults should exercise for at least 150 minutes each week. The exercise should increase your heart rate  and make you sweat (moderate-intensity exercise). ? Most adults should also do strengthening exercises at least twice a week. This is in addition to the moderate-intensity exercise.  Maintain a healthy weight  Body mass index (BMI) is a measurement that can be used to identify possible weight problems. It estimates body fat based on height and weight. Your health care provider can help determine your BMI and help you achieve or maintain a healthy weight.  For females 50 years of age and older: ? A BMI below 18.5 is considered underweight. ? A BMI of 18.5 to 24.9 is normal. ? A BMI of 25 to 29.9 is considered overweight. ? A BMI of 30 and above is considered obese.  Watch levels of cholesterol and blood lipids  You should start having your blood tested for lipids and cholesterol at 67 years of age, then have this test every 5 years.  You may need to have your cholesterol levels checked more often if: ? Your lipid or cholesterol levels are high. ? You are older than 67 years of age. ? You are at high risk for heart disease.  Cancer screening Lung Cancer  Lung cancer screening is recommended for adults 73-79 years old who are at high risk for lung cancer because of a history of smoking.  A yearly low-dose CT scan of the lungs is recommended for people who: ? Currently smoke. ? Have quit within the past 15 years. ? Have at least a 30-pack-year history of smoking. A pack year is  smoking an average of one pack of cigarettes a day for 1 year.  Yearly screening should continue until it has been 15 years since you quit.  Yearly screening should stop if you develop a health problem that would prevent you from having lung cancer treatment.  Breast Cancer  Practice breast self-awareness. This means understanding how your breasts normally appear and feel.  It also means doing regular breast self-exams. Let your health care provider know about any changes, no matter how small.  If you are  in your 20s or 30s, you should have a clinical breast exam (CBE) by a health care provider every 1-3 years as part of a regular health exam.  If you are 76 or older, have a CBE every year. Also consider having a breast X-ray (mammogram) every year.  If you have a family history of breast cancer, talk to your health care provider about genetic screening.  If you are at high risk for breast cancer, talk to your health care provider about having an MRI and a mammogram every year.  Breast cancer gene (BRCA) assessment is recommended for women who have family members with BRCA-related cancers. BRCA-related cancers include: ? Breast. ? Ovarian. ? Tubal. ? Peritoneal cancers.  Results of the assessment will determine the need for genetic counseling and BRCA1 and BRCA2 testing.  Cervical Cancer Your health care provider may recommend that you be screened regularly for cancer of the pelvic organs (ovaries, uterus, and vagina). This screening involves a pelvic examination, including checking for microscopic changes to the surface of your cervix (Pap test). You may be encouraged to have this screening done every 3 years, beginning at age 73.  For women ages 66-65, health care providers may recommend pelvic exams and Pap testing every 3 years, or they may recommend the Pap and pelvic exam, combined with testing for human papilloma virus (HPV), every 5 years. Some types of HPV increase your risk of cervical cancer. Testing for HPV may also be done on women of any age with unclear Pap test results.  Other health care providers may not recommend any screening for nonpregnant women who are considered low risk for pelvic cancer and who do not have symptoms. Ask your health care provider if a screening pelvic exam is right for you.  If you have had past treatment for cervical cancer or a condition that could lead to cancer, you need Pap tests and screening for cancer for at least 20 years after your treatment.  If Pap tests have been discontinued, your risk factors (such as having a new sexual partner) need to be reassessed to determine if screening should resume. Some women have medical problems that increase the chance of getting cervical cancer. In these cases, your health care provider may recommend more frequent screening and Pap tests.  Colorectal Cancer  This type of cancer can be detected and often prevented.  Routine colorectal cancer screening usually begins at 67 years of age and continues through 67 years of age.  Your health care provider may recommend screening at an earlier age if you have risk factors for colon cancer.  Your health care provider may also recommend using home test kits to check for hidden blood in the stool.  A small camera at the end of a tube can be used to examine your colon directly (sigmoidoscopy or colonoscopy). This is done to check for the earliest forms of colorectal cancer.  Routine screening usually begins at age 32.  Direct examination  of the colon should be repeated every 5-10 years through 67 years of age. However, you may need to be screened more often if early forms of precancerous polyps or small growths are found.  Skin Cancer  Check your skin from head to toe regularly.  Tell your health care provider about any new moles or changes in moles, especially if there is a change in a mole's shape or color.  Also tell your health care provider if you have a mole that is larger than the size of a pencil eraser.  Always use sunscreen. Apply sunscreen liberally and repeatedly throughout the day.  Protect yourself by wearing long sleeves, pants, a wide-brimmed hat, and sunglasses whenever you are outside.  Heart disease, diabetes, and high blood pressure  High blood pressure causes heart disease and increases the risk of stroke. High blood pressure is more likely to develop in: ? People who have blood pressure in the high end of the normal range  (130-139/85-89 mm Hg). ? People who are overweight or obese. ? People who are African American.  If you are 2-48 years of age, have your blood pressure checked every 3-5 years. If you are 61 years of age or older, have your blood pressure checked every year. You should have your blood pressure measured twice-once when you are at a hospital or clinic, and once when you are not at a hospital or clinic. Record the average of the two measurements. To check your blood pressure when you are not at a hospital or clinic, you can use: ? An automated blood pressure machine at a pharmacy. ? A home blood pressure monitor.  If you are between 3 years and 41 years old, ask your health care provider if you should take aspirin to prevent strokes.  Have regular diabetes screenings. This involves taking a blood sample to check your fasting blood sugar level. ? If you are at a normal weight and have a low risk for diabetes, have this test once every three years after 67 years of age. ? If you are overweight and have a high risk for diabetes, consider being tested at a younger age or more often. Preventing infection Hepatitis B  If you have a higher risk for hepatitis B, you should be screened for this virus. You are considered at high risk for hepatitis B if: ? You were born in a country where hepatitis B is common. Ask your health care provider which countries are considered high risk. ? Your parents were born in a high-risk country, and you have not been immunized against hepatitis B (hepatitis B vaccine). ? You have HIV or AIDS. ? You use needles to inject street drugs. ? You live with someone who has hepatitis B. ? You have had sex with someone who has hepatitis B. ? You get hemodialysis treatment. ? You take certain medicines for conditions, including cancer, organ transplantation, and autoimmune conditions.  Hepatitis C  Blood testing is recommended for: ? Everyone born from [birth year] through  1966. ? Anyone with known risk factors for hepatitis C.  Sexually transmitted infections (STIs)  You should be screened for sexually transmitted infections (STIs) including gonorrhea and chlamydia if: ? You are sexually active and are younger than 67 years of age. ? You are older than 66 years of age and your health care provider tells you that you are at risk for this type of infection. ? Your sexual activity has changed since you were last screened and you are  at an increased risk for chlamydia or gonorrhea. Ask your health care provider if you are at risk.  If you do not have HIV, but are at risk, it may be recommended that you take a prescription medicine daily to prevent HIV infection. This is called pre-exposure prophylaxis (PrEP). You are considered at risk if: ? You are sexually active and do not regularly use condoms or know the HIV status of your partner(s). ? You take drugs by injection. ? You are sexually active with a partner who has HIV.  Talk with your health care provider about whether you are at high risk of being infected with HIV. If you choose to begin PrEP, you should first be tested for HIV. You should then be tested every 3 months for as long as you are taking PrEP. Pregnancy  If you are premenopausal and you may become pregnant, ask your health care provider about preconception counseling.  If you may become pregnant, take 400 to 800 micrograms (mcg) of folic acid every day.  If you want to prevent pregnancy, talk to your health care provider about birth control (contraception). Osteoporosis and menopause  Osteoporosis is a disease in which the bones lose minerals and strength with aging. This can result in serious bone fractures. Your risk for osteoporosis can be identified using a bone density scan.  If you are 80 years of age or older, or if you are at risk for osteoporosis and fractures, ask your health care provider if you should be screened.  Ask your health  care provider whether you should take a calcium or vitamin D supplement to lower your risk for osteoporosis.  Menopause may have certain physical symptoms and risks.  Hormone replacement therapy may reduce some of these symptoms and risks. Talk to your health care provider about whether hormone replacement therapy is right for you. Follow these instructions at home:  Schedule regular health, dental, and eye exams.  Stay current with your immunizations.  Do not use any tobacco products including cigarettes, chewing tobacco, or electronic cigarettes.  If you are pregnant, do not drink alcohol.  If you are breastfeeding, limit how much and how often you drink alcohol.  Limit alcohol intake to no more than 1 drink per day for nonpregnant women. One drink equals 12 ounces of beer, 5 ounces of wine, or 1 ounces of hard liquor.  Do not use street drugs.  Do not share needles.  Ask your health care provider for help if you need support or information about quitting drugs.  Tell your health care provider if you often feel depressed.  Tell your health care provider if you have ever been abused or do not feel safe at home. This information is not intended to replace advice given to you by your health care provider. Make sure you discuss any questions you have with your health care provider. Document Released: 02/17/2011 Document Revised: 01/10/2016 Document Reviewed: 05/08/2015 Elsevier Interactive Patient Education  [employer].

## 2017-07-03 NOTE — Assessment & Plan Note (Signed)
Management per psych 

## 2017-07-03 NOTE — Assessment & Plan Note (Signed)
Check tsh  Titrate med dose if needed  

## 2017-07-03 NOTE — Assessment & Plan Note (Signed)
BP well controlled Current regimen effective and well tolerated Continue current medications at current doses cmp  

## 2017-07-03 NOTE — Progress Notes (Signed)
Subjective:    Patient ID: Paige [name], female    DOB: 08-Mar-1951, 66 y.o.   MRN: [id number]  HPI She is here for a physical exam.   She still feels fatigued - it is chronic.  She is active at work.    Medications and allergies reviewed with patient and updated if appropriate.  Patient Active Problem List   Diagnosis Date Noted  . Piriformis syndrome of left side 04/29/2016  . Left hip pain 03/27/2016  . Lumbar radiculopathy 03/19/2016  . Neck pain on right side 01/16/2016  . Depression 06/09/2014  . AK (actinic keratosis) 10/19/2012  . CARCINOMA, BASAL CELL, BACK 11/16/2009  . ANXIETY DISORDER, GENERALIZED 07/30/2009  . OBSTRUCTIVE SLEEP APNEA 05/28/2009  . BACK PAIN WITH RADICULOPATHY 05/28/2009  . Unspecified thrombosed hemorrhoids 10/17/2008  . CONSTIPATION, INTERMITTENT 12/20/2007  . OSTEOARTHRITIS 12/20/2007  . Hypothyroidism 06/08/2007  . Attention deficit hyperactivity disorder (ADHD) 06/08/2007  . Carpal tunnel syndrome 06/08/2007  . Essential hypertension 06/08/2007    Current Outpatient Medications on File Prior to Visit  Medication Sig Dispense Refill  . bisoprolol-hydrochlorothiazide (ZIAC) 5-6.25 MG tablet TAKE ONE TABLET BY MOUTH ONCE DAILY 90 tablet 1  . Eszopiclone 3 MG TABS Take 1 tablet (3 mg total) at bedtime as needed by mouth. Take immediately before bedtime 30 tablet 5  . levothyroxine (SYNTHROID, LEVOTHROID) 125 MCG tablet TAKE 1 TABLET BY MOUTH ONCE DAILY 30 tablet 0  . NON FORMULARY EQUATE STOOL SOFTNER PLUS STIMULANT LAXATIVE    . TURMERIC PO Take 500 mg by mouth.    . venlafaxine XR (EFFEXOR-XR) 150 MG 24 hr capsule Take 1 capsule (150 mg total) daily by mouth. 30 capsule 5   No current facility-administered medications on file prior to visit.     Past Medical History:  Diagnosis Date  . ADD (attention deficit disorder)    Pt has hard time focusing  . Anxiety   . Arthritis   . Carpal tunnel syndrome   . Colon polyp   .  Constipation   . Depression   . Gallstones   . GERD (gastroesophageal reflux disease)   . Hypertension   . Status post dilation of esophageal narrowing   . Thyroid disease     Past Surgical History:  Procedure Laterality Date  . ABDOMINAL HYSTERECTOMY    . CARPAL TUNNEL RELEASE Left   . CHOLECYSTECTOMY    . CLOSED REDUCTION HAND FRACTURE     right hand  . fx leg    . NASAL SINUS SURGERY    . SHOULDER SURGERY     reattac tendon; right shoulder  . TIBIA FRACTURE SURGERY     steel rod right leg  . TUBAL LIGATION      Social History   Socioeconomic History  . Marital status: Married    Spouse name: Not on file  . Number of children: 2  . Years of education: Not on file  . Highest education level: Not on file  Social Needs  . Financial resource strain: Not on file  . Food insecurity - worry: Not on file  . Food insecurity - inability: Not on file  . Transportation needs - medical: Not on file  . Transportation needs - non-medical: Not on file  Occupational History  . Occupation: retired    Fish farm manager: UNEMPLOYED  . Occupation: Conservation officer, historic buildings: [employer]  Tobacco Use  . Smoking status: Never Smoker  . Smokeless tobacco: Never Used  Substance  and Sexual Activity  . Alcohol use: No  . Drug use: No  . Sexual activity: No  Other Topics Concern  . Not on file  Social History Narrative   Works at [employer] - husband died of lung cancer      No regular exercise    Family History  Problem Relation Age of Onset  . Hyperlipidemia Mother   . Colon cancer Mother        57  . Lung cancer Father   . Cancer Paternal Grandfather        mets  . Leukemia Maternal Aunt     Review of Systems  Constitutional: Positive for fatigue. Negative for chills and fever.  Eyes: Positive for visual disturbance (straing eyes - needs new rx).  Respiratory: Negative for cough, shortness of breath and wheezing.   Cardiovascular: Positive  for palpitations (occ) and leg swelling (mild ankle swelling). Negative for chest pain.  Gastrointestinal: Positive for anal bleeding (occ with constipation) and constipation. Negative for abdominal pain, blood in stool, diarrhea and nausea.       Rare gerd  Genitourinary: Negative for dysuria and hematuria.  Musculoskeletal: Positive for arthralgias (mild arthritis in hands).  Skin: Negative for color change and rash.       New moles  Neurological: Negative for light-headedness and headaches.  Psychiatric/Behavioral: Positive for dysphoric mood and sleep disturbance (controlled, refreshing). The patient is nervous/anxious.        Objective:   Vitals:   07/03/17 1445  BP: 104/70  Pulse: 73  Resp: 16  Temp: 98.1 F (36.7 C)  SpO2: 96%   Filed Weights   07/03/17 1445  Weight: 196 lb (88.9 kg)   Body mass index is 35.85 kg/m.  Wt Readings from Last 3 Encounters:  07/03/17 196 lb (88.9 kg)  09/16/16 196 lb (88.9 kg)  09/01/16 196 lb 3.2 oz (89 kg)     Physical Exam Constitutional: She appears well-developed and well-nourished. No distress.  HENT:  Head: Normocephalic and atraumatic.  Right Ear: External ear normal. Normal ear canal and TM Left Ear: External ear normal.  Normal ear canal and TM Mouth/Throat: Oropharynx is clear and moist.  Eyes: Conjunctivae and EOM are normal.  Neck: Neck supple. No tracheal deviation present. No thyromegaly present.  No carotid bruit  Cardiovascular: Normal rate, regular rhythm and normal heart sounds.   No murmur heard.  No edema. Pulmonary/Chest: Effort normal and breath sounds normal. No respiratory distress. She has no wheezes. She has no rales.  Breast: deferred Abdominal: Soft. She exhibits no distension. There is no tenderness.  Lymphadenopathy: She has no cervical adenopathy.  Skin: Skin is warm and dry. She is not diaphoretic.  Psychiatric: She has a normal mood and affect. Her behavior is normal.        Assessment &  Plan:   Physical exam: Screening blood work   orderd Immunizations  Flu vaccine today, discussed shingrix Colonoscopy  Up to date  Mammogram  Due - will schedule Gyn  No longer seeing Dexa  Normal density in 2016 - up to date Eye exams  Not up to date - will schedule EKG   Last done 2013 Exercise  No regular exercise Weight  Obese, advised weight loss Skin    No concerns Substance abuse   none    See Problem List for Assessment and Plan of chronic medical problems.  Fu in one year

## 2017-07-04 ENCOUNTER — Encounter: Payer: Self-pay | Admitting: Internal Medicine

## 2017-07-04 DIAGNOSIS — E785 Hyperlipidemia, unspecified: Secondary | ICD-10-CM | POA: Insufficient documentation

## 2017-07-08 ENCOUNTER — Encounter: Payer: Self-pay | Admitting: Emergency Medicine

## 2017-07-23 ENCOUNTER — Other Ambulatory Visit: Payer: Self-pay | Admitting: Internal Medicine

## 2017-07-29 ENCOUNTER — Ambulatory Visit: Payer: Self-pay | Admitting: *Deleted

## 2017-07-29 NOTE — Telephone Encounter (Signed)
Pt called in c/o pain in her middle back going from the left to right side along with feeling very full in her abd.  She let me know she has not had a BM in 6 days and she normally goes twice/day.  After many questions I finally determined she started using a muscle relaxer a wk ago for "muscle spasms in my back"  This could be contributing to her being constipated.  She took some Milk of Magnesia 2 hrs ago.  She also has a stool softener she is going to take.   She has not taken anything for constipation until the MOM 2 hrs ago. I got her an appt with Dr. Raeford Razor on 07/30/17 at 1:00. Instructed her to call us back if she becomes worse between now and the time she sees the doctor.   She verbalized understanding. Reason for Disposition . Last bowel movement (BM) > 4 days ago  Answer Assessment - Initial Assessment Questions 1. STOOL PATTERN OR FREQUENCY: "How often do you pass bowel movements (BMs)?"  (Normal range: tid to q 3 days)  "When was the last BM passed?"       Twice a day.  Last BM was Friday.  Took muscle spasm medicine Dr. Jenny Reichmann gave me on on Sunday.  I took 3 throughout the day.   I've been taking it every day.   I'm taking it for muscle spasms in my back, I think.  I thought it would help my back. 2. STRAINING: "Do you have to strain to have a BM?"      Yes 3. RECTAL PAIN: "Does your rectum hurt when the stool comes out?" If so, ask: "Do you have hemorrhoids? How bad is the pain?"  (Scale 1-10; or mild, moderate, severe)     No pain in rectal area.   I had a colonoscopy a yr ago and everything was ok. 4. STOOL COMPOSITION: "Are the stools hard?"      Yes.   They are large and long. 5. BLOOD ON STOOLS: "Has there been any blood on the toilet tissue or on the surface of the BM?" If so, ask: "When was the last time?"      My stools look orange in color. 6. CHRONIC CONSTIPATION: "Is this a new problem for you?"  If no, ask: How long have you had this problem?" (days, weeks, months)      I  usually don't have a problem with constipation. 7. CHANGES IN DIET: "Have there been any recent changes in your diet?"      No 8. MEDICATIONS: "Have you been taking any new medications?"     Muscle relaxer  Cyclobenzarien? 5 mg 9. LAXATIVES: "Have you been using any laxatives or enemas?"  If yes, ask "What, how often, and when was the last time?"     Milk of Magnesia 2 hrs ago.   10. CAUSE: "What do you think is causing the constipation?"        No  11. OTHER SYMPTOMS: "Do you have any other symptoms?" (e.g., abdominal pain, fever, vomiting)       No abd pain just feels so full.   No vomiting  12. PREGNANCY: "Is there any chance you are pregnant?" "When was your last menstrual period?"       N/A  Protocols used: CONSTIPATION-A-AH

## 2017-07-30 ENCOUNTER — Encounter: Payer: Self-pay | Admitting: Family Medicine

## 2017-07-30 ENCOUNTER — Ambulatory Visit (INDEPENDENT_AMBULATORY_CARE_PROVIDER_SITE_OTHER): Payer: PPO | Admitting: Family Medicine

## 2017-07-30 VITALS — BP 122/66 | HR 66 | Temp 98.6°F | Ht 62.0 in | Wt 196.0 lb

## 2017-07-30 DIAGNOSIS — G8929 Other chronic pain: Secondary | ICD-10-CM

## 2017-07-30 DIAGNOSIS — M545 Low back pain, unspecified: Secondary | ICD-10-CM | POA: Insufficient documentation

## 2017-07-30 HISTORY — DX: Other chronic pain: G89.29

## 2017-07-30 NOTE — Assessment & Plan Note (Signed)
This appears to be muscular in nature and reports some improvement since it initiated. - Counseled on home exercise therapy - Counseled on over-the-counter treatments - If no improvement could consider physical therapy

## 2017-07-30 NOTE — Patient Instructions (Signed)
Thank you for coming in,   Please try the exercises that I have provided   Please try over-the-counter muscle rub such as Aspercreme with some lidocaine.   Please feel free to call with any questions or concerns at any time, at [phone]. --Dr. Raeford Razor

## 2017-07-30 NOTE — Progress Notes (Signed)
Paige [name] - 66 y.o. female MRN [id number]  Date of birth: [birth year]  SUBJECTIVE:  Including CC & ROS.  Chief Complaint  Patient presents with  . Back Pain    Paige [name] is a 66 y.o. female that is here today for an evaluation for back pain. She states the back pain is located lower left and the pain radiates across her back. It has been present for two weeks. Denies injury or surgeries to the area. It is hard to be in one position for long periods of time. The pain is described as an achy constant pain. She did take a muscle relaxer which showed some improvement. Denies any radicular symptoms. The pain is getting somewhat better. She denies any numbness or weakness.   Independent review of the lumbar spine from 03/27/16 shows some mild facet arthritis.  Review of bone density from 2016 shows normal exam.  Vitamin D from 2017 was normal.  Review of Systems  Constitutional: Negative for fever.  Respiratory: Negative for shortness of breath.   Cardiovascular: Negative for chest pain.  Musculoskeletal: Positive for back pain and myalgias. Negative for gait problem.  Skin: Negative for color change.  Neurological: Negative for weakness and numbness.  Hematological: Negative for adenopathy.    HISTORY: Past Medical, Surgical, Social, and Family History Reviewed & Updated per EMR.   Pertinent Historical Findings include:  Past Medical History:  Diagnosis Date  . ADD (attention deficit disorder)    Pt has hard time focusing  . Anxiety   . Arthritis   . Carpal tunnel syndrome   . Colon polyp   . Constipation   . Depression   . Gallstones   . GERD (gastroesophageal reflux disease)   . Hypertension   . Status post dilation of esophageal narrowing   . Thyroid disease     Past Surgical History:  Procedure Laterality Date  . ABDOMINAL HYSTERECTOMY    . CARPAL TUNNEL RELEASE Left   . CHOLECYSTECTOMY    . CLOSED REDUCTION HAND FRACTURE     right hand  . fx leg      . NASAL SINUS SURGERY    . SHOULDER SURGERY     reattac tendon; right shoulder  . TIBIA FRACTURE SURGERY     steel rod right leg  . TUBAL LIGATION      Allergies  Allergen Reactions  . Asa [Aspirin] Other (See Comments)    SOB and blocks ears and nose  . Morphine And Related Nausea And Vomiting    Family History  Problem Relation Age of Onset  . Hyperlipidemia Mother   . Colon cancer Mother        52  . Lung cancer Father   . Cancer Paternal Grandfather        mets  . Leukemia Maternal Aunt      Social History   Socioeconomic History  . Marital status: Married    Spouse name: Not on file  . Number of children: 2  . Years of education: Not on file  . Highest education level: Not on file  Social Needs  . Financial resource strain: Not on file  . Food insecurity - worry: Not on file  . Food insecurity - inability: Not on file  . Transportation needs - medical: Not on file  . Transportation needs - non-medical: Not on file  Occupational History  . Occupation: retired    Fish farm manager: UNEMPLOYED  . Occupation: Conservation officer, historic buildings: [employer]  Tobacco Use  . Smoking status: Never Smoker  . Smokeless tobacco: Never Used  Substance and Sexual Activity  . Alcohol use: No  . Drug use: No  . Sexual activity: No  Other Topics Concern  . Not on file  Social History Narrative   Works at [employer] - husband died of lung cancer      No regular exercise     PHYSICAL EXAM:  VS: BP 122/66 (BP Location: Left Arm, Patient Position: Sitting, Cuff Size: Normal)   Pulse 66   Temp 98.6 F (37 C) (Oral)   Ht 5\' 2"  (1.575 m)   Wt 196 lb (88.9 kg)   SpO2 98%   BMI 35.85 kg/m  Physical Exam Gen: NAD, alert, cooperative with exam, well-appearing ENT: normal lips, normal nasal mucosa,  Eye: normal EOM, normal conjunctiva and lids CV:  no edema, +2 pedal pulses   Resp: no accessory muscle use, non-labored,  Skin: no rashes, no  areas of induration  Neuro: normal tone, normal sensation to touch Psych:  normal insight, alert and oriented MSK:  Back: Some tenderness to palpation over the flank region to suggest external oblique pain. No tenderness to palpation over the lumbar midline no tenderness to palpation of the greater trochanter. Normal internal and external rotation of the hips. Normal strength to resistance with hip flexion Normal knee flexion and extension strength resistance Negative straight leg raise bilaterally Normal plantar and dorsiflexion Neurovascularly intact     ASSESSMENT & PLAN:   Chronic bilateral low back pain without sciatica This appears to be muscular in nature and reports some improvement since it initiated. - Counseled on home exercise therapy - Counseled on over-the-counter treatments - If no improvement could consider physical therapy

## 2017-08-06 ENCOUNTER — Other Ambulatory Visit: Payer: Self-pay | Admitting: Internal Medicine

## 2017-11-25 ENCOUNTER — Ambulatory Visit (INDEPENDENT_AMBULATORY_CARE_PROVIDER_SITE_OTHER): Payer: PPO | Admitting: Psychiatry

## 2017-11-25 ENCOUNTER — Encounter (HOSPITAL_COMMUNITY): Payer: Self-pay | Admitting: Psychiatry

## 2017-11-25 VITALS — BP 115/69 | HR 77 | Ht 62.0 in | Wt 197.0 lb

## 2017-11-25 DIAGNOSIS — Z560 Unemployment, unspecified: Secondary | ICD-10-CM

## 2017-11-25 DIAGNOSIS — F341 Dysthymic disorder: Secondary | ICD-10-CM | POA: Diagnosis not present

## 2017-11-25 MED ORDER — VENLAFAXINE HCL ER 150 MG PO CP24: 150.0000 mg | ORAL_CAPSULE | Freq: Every day | ORAL | 7 refills | Status: DC

## 2017-11-25 MED ORDER — ESZOPICLONE 3 MG PO TABS: 3.0000 mg | ORAL_TABLET | Freq: Every evening | ORAL | 5 refills | Status: DC | PRN

## 2017-11-25 NOTE — Progress Notes (Signed)
Patient ID: Paige [name], female   DOB: 31-Aug-1950, 67 y.o.   MRN: [id number] Patient ID: Paige [name], female   DOB: 17-May-1951, 67 y.o.   MRN: [id number] [facility] MD Progress Note  11/25/2017 3:57 PM Paige Mohair   Pa time.st Medical History:   MRN:  [id number] Subjective: Mild depression Today the patient isn't as well as she's been in the past. 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 In about a month patient will be off of her bankruptcy process. The patient denies any worsening depression. She is chronic dysthymia. Unfortunately she's quite isolated. She is sleeping and eating well as long she takes [place]. She takes her to record. She drinks no alcohol at all. 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 The patient denies the use of alcohol or drugs. She takes her medicine as prescribed and claims it is beneficial. Past Medical History:  Diagnosis Date  . ADD (attention deficit disorder)    Pt has hard time focusing  . Anxiety   . Arthritis   . Carpal tunnel syndrome   . Colon polyp   . Constipation   . Depression   . Gallstones   . GERD (gastroesophageal reflux disease)   . Hypertension   . Status post dilation of esophageal narrowing   . Thyroid disease     Past Surgical History:  Procedure Laterality Date  . ABDOMINAL HYSTERECTOMY    . CARPAL TUNNEL RELEASE Left   . CHOLECYSTECTOMY    . CLOSED REDUCTION HAND FRACTURE     right hand  . fx leg    . NASAL SINUS SURGERY    . SHOULDER SURGERY     reattac tendon; right shoulder  . TIBIA FRACTURE SURGERY     steel rod right leg  . TUBAL LIGATION     Family History:  Family History  Problem Relation Age of Onset  . Hyperlipidemia Mother   . Colon cancer Mother        13  . Lung cancer Father   . Cancer Paternal Grandfather        mets  . Leukemia Maternal  Aunt    Family Psychiatric  History:  Social History:  Social History   Substance and Sexual Activity  Alcohol Use No     Social History   Substance and Sexual Activity  Drug Use No    Social History   Socioeconomic History  . Marital status: Married    Spouse name: Not on file  . Number of children: 2  . Years of education: Not on file  . Highest education level: Not on file  Occupational History  . Occupation: retired    Fish farm manager: UNEMPLOYED  . Occupation: Conservation officer, historic buildings: [employer]  . Financial resource strain: Not on file  . Food insecurity:    Worry: Not on file    Inability: Not on file  . Transportation needs:    Medical: Not on file    Non-medical: Not on file  Tobacco Use  . Smoking status: Never Smoker  . Smokeless tobacco: Never Used  Substance and Sexual Activity  . Alcohol use: No  . Drug use: No  . Sexual activity: Never  Lifestyle  . Physical activity:    Days per week: Not on file  Minutes per session: Not on file  . Stress: Not on file  Relationships  . Social connections:    Talks on phone: Not on file    Gets together: Not on file    Attends religious service: Not on file    Active member of club or organization: Not on file    Attends meetings of clubs or organizations: Not on file    Relationship status: Not on file  Other Topics Concern  . Not on file  Social History Narrative   Works at [employer] - husband died of lung cancer      No regular exercise   Additional Social History:                         Sleep: Fair  Appetite:  Good  Current Medications: Current Outpatient Medications  Medication Sig Dispense Refill  . bisoprolol-hydrochlorothiazide (ZIAC) 5-6.25 MG tablet TAKE 1 TABLET BY MOUTH ONCE DAILY 90 tablet 3  . Eszopiclone 3 MG TABS Take 1 tablet (3 mg total) by mouth at bedtime as needed. Take immediately before bedtime 30 tablet 5  .  levothyroxine (SYNTHROID, LEVOTHROID) 125 MCG tablet TAKE 1 TABLET BY MOUTH ONCE DAILY 90 tablet 1  . NON FORMULARY EQUATE STOOL SOFTNER PLUS STIMULANT LAXATIVE    . venlafaxine XR (EFFEXOR-XR) 150 MG 24 hr capsule Take 1 capsule (150 mg total) by mouth daily. 30 capsule 7  . TURMERIC PO Take 500 mg by mouth.     No current facility-administered medications for this visit.     Lab Results: No results found for this or any previous visit (from the past 48 hour(s)).  Blood Alcohol level:  No results found for: [facility]  Physical Findings: AIMS:  , ,  ,  ,    CIWA:    COWS:     Musculoskeletal: Strength & Muscle Tone: within normal limits Gait & Station: normal Patient leans: N/A  Psychiatric Specialty Exam: ROS  Blood pressure 115/69, pulse 77, height 5\' 2"  (1.575 m), weight 197 lb (89.4 kg), SpO2 97 %.Body mass index is 36.03 kg/m.  General Appearance: Fairly Groomed  Engineer, water::  Good  Speech:  Clear and Coherent  Volume:  Normal  Mood:  Euthymic  Affect:  Blunt  Thought Process:  Coherent  Orientation:  Full (Time, Place, and Person)  Thought Content:  WDL  Suicidal Thoughts:  No  Homicidal Thoughts:  No  Memory:  NA  Judgement:  NA  Insight:  Good  Psychomotor Activity:  Decreased  Concentration:  Fair  Recall:  [employer] of Knowledge:Good  Language: Fair  Akathisia:  No  Handed:  Right  AIMS (if indicated):     Assets:  Desire for Improvement  ADL's:  Intact  Cognition: WNL  Sleep:      Treatment Plan Summary: 11/25/2017, 3:57 PM  At this time the patient continue taking Lunesta 3 mg taking Effexor 150 mg.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 At that time we will more thorough evaluation this time I believe she is stable. She certainly is not suicidal.

## 2018-01-26 ENCOUNTER — Other Ambulatory Visit: Payer: Self-pay | Admitting: Internal Medicine

## 2018-02-11 ENCOUNTER — Ambulatory Visit (INDEPENDENT_AMBULATORY_CARE_PROVIDER_SITE_OTHER): Payer: PPO | Admitting: *Deleted

## 2018-02-11 VITALS — BP 106/62 | HR 65 | Resp 18 | Ht 62.0 in | Wt 201.0 lb

## 2018-02-11 DIAGNOSIS — Z0000 Encounter for general adult medical examination without abnormal findings: Secondary | ICD-10-CM | POA: Diagnosis not present

## 2018-02-11 DIAGNOSIS — Z1231 Encounter for screening mammogram for malignant neoplasm of breast: Secondary | ICD-10-CM

## 2018-02-11 NOTE — Progress Notes (Addendum)
Subjective:   Paige [name] is a 67 y.o. female who presents for an Initial Medicare Annual Wellness Visit.  Review of Systems    No ROS.  Medicare Wellness Visit. Additional risk factors are reflected in the social history.  Cardiac Risk Factors include: advanced age (>68men, >47 women);hypertension Sleep patterns: gets up 1 times nightly to void and sleeps 6 hours nightly. Patient reports insomnia issues, discussed recommended sleep tips and stress reduction tips.   Home Safety/Smoke Alarms: Feels safe in home. Smoke alarms in place.  Living environment; residence and Firearm Safety: 1-story house/ trailer, no firearms. Lives alone, no needs for DME, good support system Seat Belt Safety/Bike Helmet: Wears seat belt.    Objective:    Today's Vitals   02/11/18 1033  BP: 106/62  Pulse: 65  Resp: 18  SpO2: 98%  Weight: 201 lb (91.2 kg)  Height: 5\' 2"  (1.575 m)   Body mass index is 36.76 kg/m.  Advanced Directives 02/11/2018 09/01/2016  Does Patient Have a Medical Advance Directive? No No  Would patient like information on creating a medical advance directive? Yes (ED - Information included in AVS) -    Current Medications (verified) Outpatient Encounter Medications as of 02/11/2018  Medication Sig  . bisoprolol-hydrochlorothiazide (ZIAC) 5-6.25 MG tablet TAKE 1 TABLET BY MOUTH ONCE DAILY  . Eszopiclone 3 MG TABS Take 1 tablet (3 mg total) by mouth at bedtime as needed. Take immediately before bedtime  . levothyroxine (SYNTHROID, LEVOTHROID) 125 MCG tablet TAKE 1 TABLET BY MOUTH ONCE DAILY  . NON FORMULARY EQUATE STOOL SOFTNER PLUS STIMULANT LAXATIVE  . venlafaxine XR (EFFEXOR-XR) 150 MG 24 hr capsule Take 1 capsule (150 mg total) by mouth daily.  . [DISCONTINUED] TURMERIC PO Take 500 mg by mouth.   No facility-administered encounter medications on file as of 02/11/2018.     Allergies (verified) Asa [aspirin] and Morphine and related   History: Past Medical  History:  Diagnosis Date  . ADD (attention deficit disorder)    Pt has hard time focusing  . Anxiety   . Arthritis   . Carpal tunnel syndrome   . Colon polyp   . Constipation   . Depression   . Gallstones   . GERD (gastroesophageal reflux disease)   . Hypertension   . Status post dilation of esophageal narrowing   . Thyroid disease    Past Surgical History:  Procedure Laterality Date  . ABDOMINAL HYSTERECTOMY    . CARPAL TUNNEL RELEASE Left   . CHOLECYSTECTOMY    . CLOSED REDUCTION HAND FRACTURE     right hand  . fx leg    . NASAL SINUS SURGERY    . SHOULDER SURGERY     reattac tendon; right shoulder  . TIBIA FRACTURE SURGERY     steel rod right leg  . TUBAL LIGATION     Family History  Problem Relation Age of Onset  . Hyperlipidemia Mother   . Colon cancer Mother        64  . Lung cancer Father   . Cancer Paternal Grandfather        mets  . Leukemia Maternal Aunt    Social History   Socioeconomic History  . Marital status: Widowed    Spouse name: Not on file  . Number of children: 2  . Years of education: Not on file  . Highest education level: Not on file  Occupational History  . Occupation: retired    Fish farm manager: UNEMPLOYED  .  Occupation: Conservation officer, historic buildings: [place]  . Financial resource strain: Not hard at all  . Food insecurity:    Worry: Never true    Inability: Never true  . Transportation needs:    Medical: No    Non-medical: No  Tobacco Use  . Smoking status: Never Smoker  . Smokeless tobacco: Never Used  Substance and Sexual Activity  . Alcohol use: No  . Drug use: No  . Sexual activity: Never  Lifestyle  . Physical activity:    Days per week: 0 days    Minutes per session: 0 min  . Stress: Only a little  Relationships  . Social connections:    Talks on phone: More than three times a week    Gets together: More than three times a week    Attends religious service: Not on file    Active member of  club or organization: Not on file    Attends meetings of clubs or organizations: Not on file    Relationship status: Not on file  Other Topics Concern  . Not on file  Social History Narrative   Works at [employer] - husband died of lung cancer      No regular exercise    Tobacco Counseling Counseling given: Not Answered     Activities of Daily Living In your present state of health, do you have any difficulty performing the following activities: 02/11/2018  Hearing? N  Vision? N  Difficulty concentrating or making decisions? N  Walking or climbing stairs? N  Dressing or bathing? N  Doing errands, shopping? N  Preparing Food and eating ? N  Using the Toilet? N  In the past six months, have you accidently leaked urine? N  Do you have problems with loss of bowel control? N  Managing your Medications? N  Managing your Finances? N  Housekeeping or managing your Housekeeping? N  Some recent data might be hidden     Immunizations and Health Maintenance Immunization History  Administered Date(s) Administered  . Influenza, High Dose Seasonal PF 06/27/2016, 07/03/2017  . Pneumococcal Conjugate-13 07/18/2016  . Pneumococcal Polysaccharide-23 05/25/2015  . Tdap 03/03/2012   Health Maintenance Due  Topic Date Due  . MAMMOGRAM  06/17/2017    Patient Care Team: Binnie Rail, MD as PCP - General (Internal Medicine) Norma Fredrickson, MD as Consulting Physician (Psychiatry)  Indicate any recent Medical Services you may have received from other than [facility] providers in the past year (date may be approximate).     Assessment:   This is a routine wellness examination for Paige [name]. Physical assessment deferred to PCP.   Hearing/Vision screen Hearing Screening Comments: Able to hear conversational tones w/o difficulty. No issues reported.  Passed whisper test Vision Screening Comments: appointment yearly   Dietary issues and exercise activities  discussed: Current Exercise Habits: The patient does not participate in regular exercise at present(chair exercise pamphlets provided)  Diet (meal preparation, eat out, water intake, caffeinated beverages, dairy products, fruits and vegetables): in general, a "healthy" diet     Reviewed heart healthy diet. Encouraged patient to increase daily water and fluid intake. Discussed weight loss strategies, Diet education was attached to patient's AVS.  Goals    . Patient Stated     Lose weight by decrease eating sweets, breads, eat more fruit and veg. Increase physical activity by starting chair exercises.       Depression  Screen PHQ 2/9 Scores 02/11/2018 07/03/2017 06/27/2016 04/13/2015 02/21/2013  PHQ - 2 Score 2 0 0 0 2  PHQ- 9 Score 6 - - - 12    Fall Risk Fall Risk  02/11/2018 07/03/2017 06/27/2016 04/13/2015 02/21/2013  Falls in the past year? No No No Yes Yes  Number falls in past yr: - - - 1 2 or more  Injury with Fall? - - - No -  Risk for fall due to : - - - Impaired balance/gait Impaired balance/gait;Impaired mobility     Cognitive Function: MMSE - Mini Mental State Exam 02/11/2018  Orientation to time 5  Orientation to Place 5  Registration 3  Attention/ Calculation 5  Recall 1  Language- name 2 objects 2  Language- repeat 1  Language- follow 3 step command 3  Language- read & follow direction 1  Write a sentence 1  Copy design 1  Total score 28        Screening Tests Health Maintenance  Topic Date Due  . MAMMOGRAM  06/17/2017  . INFLUENZA VACCINE  03/18/2018  . DEXA SCAN  06/17/2020  . COLONOSCOPY  09/30/2021  . TETANUS/TDAP  03/03/2022  . Hepatitis C Screening  Completed  . PNA vac Low Risk Adult  Completed       Plan:     Continue doing brain stimulating activities (puzzles, reading, adult coloring books, staying active) to keep memory sharp.   Continue to eat heart healthy diet (full of fruits, vegetables, whole grains, lean protein, water--limit salt,  fat, and sugar intake) and increase physical activity as tolerated.  I have personally reviewed and noted the following in the patient's chart:   . Medical and social history . Use of alcohol, tobacco or illicit drugs  . Current medications and supplements . Functional ability and status . Nutritional status . Physical activity . Advanced directives . List of other physicians . Vitals . Screenings to include cognitive, depression, and falls . Referrals and appointments  In addition, I have reviewed and discussed with patient certain preventive protocols, quality metrics, and best practice recommendations. A written personalized care plan for preventive services as well as general preventive health recommendations were provided to patient.     Michiel Cowboy, RN   02/11/2018    Medical screening examination/treatment/procedure(s) were performed by non-physician practitioner and as supervising physician I was immediately available for consultation/collaboration. I agree with above. Binnie Rail, MD

## 2018-02-11 NOTE — Patient Instructions (Addendum)
[place] [facility] in [place] in: [place] Address: [address], [place], [place] [address] Phone: [phone]  [facility] [place] [facility]ologist in [place], [place] Address: [address] [place], [place] [address] Phone: [phone]  Dr. Cliffton Asters, MD Dermatology  13Rate This Doctor [phone]   [facility] [address], [place] [address]  Continue doing brain stimulating activities (puzzles, reading, adult coloring books, staying active) to keep memory sharp.   Continue to eat heart healthy diet (full of fruits, vegetables, whole grains, lean protein, water--limit salt, fat, and sugar intake) and increase physical activity as tolerated.   Paige [name] , Thank you for taking time to come for your Medicare Wellness Visit. I appreciate your ongoing commitment to your health goals. Please review the following plan we discussed and let me know if I can assist you in the future.   These are the goals we discussed: Goals    . Patient Stated     Lose weight by decrease eating sweets, breads, eat more fruit and veg. Increase physical activity by starting chair exercises.        This is a list of the screening recommended for you and due dates:  Health Maintenance  Topic Date Due  . Mammogram  06/17/2017  . Flu Shot  03/18/2018  . DEXA scan (bone density measurement)  06/17/2020  . Colon Cancer Screening  09/30/2021  . Tetanus Vaccine  03/03/2022  .  Hepatitis C: One time screening is recommended by Center for Disease Control  (CDC) for  adults born from [birth year] through 1966.   Completed  . Pneumonia vaccines  Completed     Health Maintenance, Female Adopting a healthy lifestyle and getting preventive care can go a long way to promote health and wellness. Talk with your health care provider about what schedule of regular examinations is right for  you. This is a good chance for you to check in with your provider about disease prevention and staying healthy. In between checkups, there are plenty of things you can do on your own. Experts have done a lot of research about which lifestyle changes and preventive measures are most likely to keep you healthy. Ask your health care provider for more information. Weight and diet Eat a healthy diet  Be sure to include plenty of vegetables, fruits, low-fat dairy products, and lean protein.  Do not eat a lot of foods high in solid fats, added sugars, or salt.  Get regular exercise. This is one of the most important things you can do for your health. ? Most adults should exercise for at least 150 minutes each week. The exercise should increase your heart rate and make you sweat (moderate-intensity exercise). ? Most adults should also do strengthening exercises at least twice a week. This is in addition to the moderate-intensity exercise.  Maintain a healthy weight  Body mass index (BMI) is a measurement that can be used to identify possible weight problems. It estimates body fat based on height and weight. Your health care provider can help determine your BMI and help you achieve or maintain a healthy weight.  For females 56 years of age and older: ? A BMI below 18.5 is considered underweight. ? A BMI of 18.5 to 24.9 is normal. ? A BMI of 25 to 29.9 is considered overweight. ? A BMI of 30 and above is considered obese.  Watch levels of cholesterol  and blood lipids  You should start having your blood tested for lipids and cholesterol at 68 years of age, then have this test every 5 years.  You may need to have your cholesterol levels checked more often if: ? Your lipid or cholesterol levels are high. ? You are older than 68 years of age. ? You are at high risk for heart disease.  Cancer screening Lung Cancer  Lung cancer screening is recommended for adults 38-70 years old who are at high  risk for lung cancer because of a history of smoking.  A yearly low-dose CT scan of the lungs is recommended for people who: ? Currently smoke. ? Have quit within the past 15 years. ? Have at least a 30-pack-year history of smoking. A pack year is smoking an average of one pack of cigarettes a day for 1 year.  Yearly screening should continue until it has been 15 years since you quit.  Yearly screening should stop if you develop a health problem that would prevent you from having lung cancer treatment.  Breast Cancer  Practice breast self-awareness. This means understanding how your breasts normally appear and feel.  It also means doing regular breast self-exams. Let your health care provider know about any changes, no matter how small.  If you are in your 20s or 30s, you should have a clinical breast exam (CBE) by a health care provider every 1-3 years as part of a regular health exam.  If you are 38 or older, have a CBE every year. Also consider having a breast X-ray (mammogram) every year.  If you have a family history of breast cancer, talk to your health care provider about genetic screening.  If you are at high risk for breast cancer, talk to your health care provider about having an MRI and a mammogram every year.  Breast cancer gene (BRCA) assessment is recommended for women who have family members with BRCA-related cancers. BRCA-related cancers include: ? Breast. ? Ovarian. ? Tubal. ? Peritoneal cancers.  Results of the assessment will determine the need for genetic counseling and BRCA1 and BRCA2 testing.  Cervical Cancer Your health care provider may recommend that you be screened regularly for cancer of the pelvic organs (ovaries, uterus, and vagina). This screening involves a pelvic examination, including checking for microscopic changes to the surface of your cervix (Pap test). You may be encouraged to have this screening done every 3 years, beginning at age 14.  For  women ages 3-65, health care providers may recommend pelvic exams and Pap testing every 3 years, or they may recommend the Pap and pelvic exam, combined with testing for human papilloma virus (HPV), every 5 years. Some types of HPV increase your risk of cervical cancer. Testing for HPV may also be done on women of any age with unclear Pap test results.  Other health care providers may not recommend any screening for nonpregnant women who are considered low risk for pelvic cancer and who do not have symptoms. Ask your health care provider if a screening pelvic exam is right for you.  If you have had past treatment for cervical cancer or a condition that could lead to cancer, you need Pap tests and screening for cancer for at least 20 years after your treatment. If Pap tests have been discontinued, your risk factors (such as having a new sexual partner) need to be reassessed to determine if screening should resume. Some women have medical problems that increase the chance of getting  cervical cancer. In these cases, your health care provider may recommend more frequent screening and Pap tests.  Colorectal Cancer  This type of cancer can be detected and often prevented.  Routine colorectal cancer screening usually begins at 68 years of age and continues through 68 years of age.  Your health care provider may recommend screening at an earlier age if you have risk factors for colon cancer.  Your health care provider may also recommend using home test kits to check for hidden blood in the stool.  A small camera at the end of a tube can be used to examine your colon directly (sigmoidoscopy or colonoscopy). This is done to check for the earliest forms of colorectal cancer.  Routine screening usually begins at age 22.  Direct examination of the colon should be repeated every 5-10 years through 68 years of age. However, you may need to be screened more often if early forms of precancerous polyps or small  growths are found.  Skin Cancer  Check your skin from head to toe regularly.  Tell your health care provider about any new moles or changes in moles, especially if there is a change in a mole's shape or color.  Also tell your health care provider if you have a mole that is larger than the size of a pencil eraser.  Always use sunscreen. Apply sunscreen liberally and repeatedly throughout the day.  Protect yourself by wearing long sleeves, pants, a wide-brimmed hat, and sunglasses whenever you are outside.  Heart disease, diabetes, and high blood pressure  High blood pressure causes heart disease and increases the risk of stroke. High blood pressure is more likely to develop in: ? People who have blood pressure in the high end of the normal range (130-139/85-89 mm Hg). ? People who are overweight or obese. ? People who are African American.  If you are 37-71 years of age, have your blood pressure checked every 3-5 years. If you are 51 years of age or older, have your blood pressure checked every year. You should have your blood pressure measured twice-once when you are at a hospital or clinic, and once when you are not at a hospital or clinic. Record the average of the two measurements. To check your blood pressure when you are not at a hospital or clinic, you can use: ? An automated blood pressure machine at a pharmacy. ? A home blood pressure monitor.  If you are between 68 years and 49 years old, ask your health care provider if you should take aspirin to prevent strokes.  Have regular diabetes screenings. This involves taking a blood sample to check your fasting blood sugar level. ? If you are at a normal weight and have a low risk for diabetes, have this test once every three years after 68 years of age. ? If you are overweight and have a high risk for diabetes, consider being tested at a younger age or more often. Preventing infection Hepatitis B  If you have a higher risk for  hepatitis B, you should be screened for this virus. You are considered at high risk for hepatitis B if: ? You were born in a country where hepatitis B is common. Ask your health care provider which countries are considered high risk. ? Your parents were born in a high-risk country, and you have not been immunized against hepatitis B (hepatitis B vaccine). ? You have HIV or AIDS. ? You use needles to inject street drugs. ? You live  with someone who has hepatitis B. ? You have had sex with someone who has hepatitis B. ? You get hemodialysis treatment. ? You take certain medicines for conditions, including cancer, organ transplantation, and autoimmune conditions.  Hepatitis C  Blood testing is recommended for: ? Everyone born from [birth year] through 1966. ? Anyone with known risk factors for hepatitis C.  Sexually transmitted infections (STIs)  You should be screened for sexually transmitted infections (STIs) including gonorrhea and chlamydia if: ? You are sexually active and are younger than 68 years of age. ? You are older than 68 years of age and your health care provider tells you that you are at risk for this type of infection. ? Your sexual activity has changed since you were last screened and you are at an increased risk for chlamydia or gonorrhea. Ask your health care provider if you are at risk.  If you do not have HIV, but are at risk, it may be recommended that you take a prescription medicine daily to prevent HIV infection. This is called pre-exposure prophylaxis (PrEP). You are considered at risk if: ? You are sexually active and do not regularly use condoms or know the HIV status of your partner(s). ? You take drugs by injection. ? You are sexually active with a partner who has HIV.  Talk with your health care provider about whether you are at high risk of being infected with HIV. If you choose to begin PrEP, you should first be tested for HIV. You should then be tested every 3  months for as long as you are taking PrEP. Pregnancy  If you are premenopausal and you may become pregnant, ask your health care provider about preconception counseling.  If you may become pregnant, take 400 to 800 micrograms (mcg) of folic acid every day.  If you want to prevent pregnancy, talk to your health care provider about birth control (contraception). Osteoporosis and menopause  Osteoporosis is a disease in which the bones lose minerals and strength with aging. This can result in serious bone fractures. Your risk for osteoporosis can be identified using a bone density scan.  If you are 76 years of age or older, or if you are at risk for osteoporosis and fractures, ask your health care provider if you should be screened.  Ask your health care provider whether you should take a calcium or vitamin D supplement to lower your risk for osteoporosis.  Menopause may have certain physical symptoms and risks.  Hormone replacement therapy may reduce some of these symptoms and risks. Talk to your health care provider about whether hormone replacement therapy is right for you. Follow these instructions at home:  Schedule regular health, dental, and eye exams.  Stay current with your immunizations.  Do not use any tobacco products including cigarettes, chewing tobacco, or electronic cigarettes.  If you are pregnant, do not drink alcohol.  If you are breastfeeding, limit how much and how often you drink alcohol.  Limit alcohol intake to no more than 1 drink per day for nonpregnant women. One drink equals 12 ounces of beer, 5 ounces of wine, or 1 ounces of hard liquor.  Do not use street drugs.  Do not share needles.  Ask your health care provider for help if you need support or information about quitting drugs.  Tell your health care provider if you often feel depressed.  Tell your health care provider if you have ever been abused or do not feel safe at home. This  information is  not intended to replace advice given to you by your health care provider. Make sure you discuss any questions you have with your health care provider. Document Released: 02/17/2011 Document Revised: 01/10/2016 Document Reviewed: 05/08/2015 Elsevier Interactive Patient Education  2018 [place] on a Budget There are many ways to save money at the grocery store and continue to eat healthy. You can be successful if you plan your meals according to your budget, purchase according to your budget and grocery list, and prepare food yourself. How can I buy more food on a limited budget? Plan  Plan meals and snacks according to a grocery list and budget you create.  Look for recipes where you can cook once and make enough food for two meals.  Include meals that will "stretch" more expensive foods such as stews, casseroles, and stir-fry dishes.  Make a grocery list and make sure to bring it with you to the store. If you have a smart phone, you could use your phone to create your shopping list. Purchase  When grocery shopping, buy only the items on your grocery list and go only to the areas of the store that have the items on your list. Prepare  Some meal items can be prepared in advance. Pre-cook on days when you have extra time.  Make extra food (such as by doubling recipes) and freeze the extras in meal-sized containers or in individual portions for fast meals and snacks.  Use leftovers in your meal plan for the week.  Try some meatless meals or try "no cook" meals like salads.  When you come home from the grocery store, wash and prepare your fruits and vegetables so they are ready to use and eat. This will help reduce food waste. How can I buy more food on a limited budget? Try these tips the next time you go shopping:  [place] store brands or generic brands.  Use coupons only for foods and brands you normally buy. Avoid buying items you wouldn't normally buy simply  because they are on sale.  Check online and in newspapers for weekly deals.  Buy healthy items from the bulk bins when available, such as herbs, spices, flours, pastas, nuts, and dried fruit.  Buy fruits and vegetables that are in season. Prices are usually lower on in-season produce.  Compare and contrast different items. You can do this by looking at the unit price on the price tag. Use it to compare different brands and sizes to find out which item is the best deal.  Choose naturally low-cost healthy items, such as carrots, potatoes, apples, bananas, and oranges. Dried or canned beans are a low-cost protein source.  Buy in bulk and freeze extra food. Items you can buy in bulk include meats, fish, poultry, frozen fruits, and frozen vegetables.  Limit the purchase of prepared or "ready-to-eat" foods, such as pre-cut fruits and vegetables and pre-made salads.  If possible, shop around to discover which grocery store offers the best prices. Some stores charge much more than other stores for the same items.  Do not shop when you are hungry. If you shop while hungry, It may be hard to stick to your list and budget.  Stick to your list and resist impulse buys. Treat your list as your official plan for the week.  Buy a variety of vegetables and fruit by purchasing fresh, frozen, and canned items.  Look beyond eye level. Foods at eye level (adult or  child eye level) are more expensive. Look at the top and bottom shelves for deals.  Be efficient with your time when shopping. The more time you spend at the store, the more money you are likely to spend.  Consider other retailers such as dollar stores, larger [employer], local fruit and vegetable stands, and farmers markets.  What are some tips for less expensive food substitutions? When choosing more expensive foods like meats and dairy, try these tips to save money:  Choose cheaper cuts of meat, such as bone-in chicken thighs and  drumsticks instead skinless and boneless chicken. When you are ready to prepare the chicken, you can remove the skin yourself to make it healthier.  Choose lean meats like chicken or [place]. When choosing ground beef, make sure it is lean ground beef (92% lean, 8% fat). If you do buy a fattier ground beef, drain the fat before eating.  Buy dried beans and peas, such as lentils, split peas, or kidney beans.  For seafood, choose canned tuna, salmon, or sardines.  Eggs are a low-cost source of protein.  Buy the larger tubs of yogurt instead of individual-sized containers.  Choose water instead of sodas and other sweetened beverages.  Skip buying chips, cookies, and other "junk food". These items are usually expensive, high in calories, and low in nutritional value.  How can I prepare the foods I buy in the healthiest way? Practice these tips for cooking foods in the healthiest way to reduce excess fat and calorie intake:  Steam, saute, grill, or bake foods instead of frying them.  Make sure half your plate is filled with fruits or vegetables. Choose from fresh, frozen, or canned fruits and vegetables. If eating canned, remember to rinse them before eating. This will remove any excess salt added for packaging.  Trim all fat from meat before cooking. Remove the skin from chicken or [place].  Spoon off fat from meat dishes once they have been chilled in the refrigerator and the fat has hardened on the top.  Use skim milk, low-fat milk, or evaporated skim milk when making cream sauces, soups, or puddings.  Substitute low-fat yogurt, sour cream, or cottage cheese for sour cream and mayonnaise in dips and dressings.  Try lemon juice, herbs, or spices to season food instead of salt, butter, or margarine.  This information is not intended to replace advice given to you by your health care provider. Make sure you discuss any questions you have with your health care provider. Document Released:  04/07/2014 Document Revised: 02/22/2016 Document Reviewed: 03/07/2014 Elsevier Interactive Patient Education  [employer].

## 2018-03-16 ENCOUNTER — Other Ambulatory Visit: Payer: Self-pay | Admitting: Internal Medicine

## 2018-03-16 DIAGNOSIS — Z1231 Encounter for screening mammogram for malignant neoplasm of breast: Secondary | ICD-10-CM

## 2018-04-08 ENCOUNTER — Ambulatory Visit
Admission: RE | Admit: 2018-04-08 | Discharge: 2018-04-08 | Disposition: A | Discharge status: Home / Self Care | Payer: PPO | Source: Ambulatory Visit | Attending: Internal Medicine | Admitting: Internal Medicine

## 2018-04-08 DIAGNOSIS — Z1231 Encounter for screening mammogram for malignant neoplasm of breast: Secondary | ICD-10-CM

## 2018-05-05 ENCOUNTER — Encounter (HOSPITAL_COMMUNITY): Payer: Self-pay | Admitting: Psychiatry

## 2018-05-05 ENCOUNTER — Ambulatory Visit (INDEPENDENT_AMBULATORY_CARE_PROVIDER_SITE_OTHER): Payer: PPO | Admitting: Psychiatry

## 2018-05-05 VITALS — BP 112/72 | HR 76 | Ht 62.0 in | Wt 201.0 lb

## 2018-05-05 DIAGNOSIS — Z560 Unemployment, unspecified: Secondary | ICD-10-CM

## 2018-05-05 DIAGNOSIS — F341 Dysthymic disorder: Secondary | ICD-10-CM | POA: Diagnosis not present

## 2018-05-05 MED ORDER — ESZOPICLONE 3 MG PO TABS: 3.0000 mg | ORAL_TABLET | Freq: Every evening | ORAL | 5 refills | Status: DC | PRN

## 2018-05-05 MED ORDER — VENLAFAXINE HCL ER 150 MG PO CP24: 150.0000 mg | ORAL_CAPSULE | Freq: Every day | ORAL | 7 refills | Status: DC

## 2018-05-05 NOTE — Progress Notes (Signed)
Patient ID: Paige [name], female   DOB: 11-23-1950, 67 y.o.   MRN: [id number] Patient ID: Paige [name], female   DOB: 28-Mar-1951, 67 y.o.   MRN: [id number] [facility] MD Progress Note  05/05/2018 4:09 PM Paige Mohair   Pa time.st Medical History:   MRN:  [id number] Subjective: Mild depression At this time the patient is doing well. She denies daily depression. She is sleeping and eating well. 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 She enjoys being back at work. Patient has no problems thinking concentration well. She denies suicidal thinking. She's not. She enjoys reading watching TV. The patient has financial stresses but her 2 sons are helping her out. She denies the use of alcohol or drugs she denies chest pain or shortness of breath or any neurological the patient exercises very little. Patient is not suicidal. She believes her Paige [name] helps her great deal sleep at night. At this time she is very stable. Past Medical History:  Diagnosis Date  . ADD (attention deficit disorder)    Pt has hard time focusing  . Anxiety   . Arthritis   . Carpal tunnel syndrome   . Colon polyp   . Constipation   . Depression   . Gallstones   . GERD (gastroesophageal reflux disease)   . Hypertension   . Status post dilation of esophageal narrowing   . Thyroid disease     Past Surgical History:  Procedure Laterality Date  . ABDOMINAL HYSTERECTOMY    . CARPAL TUNNEL RELEASE Left   . CHOLECYSTECTOMY    . CLOSED REDUCTION HAND FRACTURE     right hand  . fx leg    . NASAL SINUS SURGERY    . SHOULDER SURGERY     reattac tendon; right shoulder  . TIBIA FRACTURE SURGERY     steel rod right leg  . TUBAL LIGATION     Family History:  Family History  Problem Relation Age of Onset  . Hyperlipidemia Mother   . Colon cancer Mother        52  .  Lung cancer Father   . Cancer Paternal Grandfather        mets  . Leukemia Maternal Aunt    Family Psychiatric  History:  Social History:  Social History   Substance and Sexual Activity  Alcohol Use No     Social History   Substance and Sexual Activity  Drug Use No    Social History   Socioeconomic History  . Marital status: Widowed    Spouse name: Not on file  . Number of children: 2  . Years of education: Not on file  . Highest education level: Not on file  Occupational History  . Occupation: retired    Fish farm manager: UNEMPLOYED  . Occupation: Conservation officer, historic buildings: [place]  . Financial resource strain: Not hard at all  . Food insecurity:    Worry: Never true    Inability: Never true  . Transportation needs:    Medical: No    Non-medical: No  Tobacco Use  . Smoking status: Never Smoker  . Smokeless tobacco: Never Used  Substance and Sexual Activity  . Alcohol use: No  . Drug use: No  . Sexual activity: Never  Lifestyle  . Physical activity:    Days per week: 0  days    Minutes per session: 0 min  . Stress: Only a little  Relationships  . Social connections:    Talks on phone: More than three times a week    Gets together: More than three times a week    Attends religious service: Not on file    Active member of club or organization: Not on file    Attends meetings of clubs or organizations: Not on file    Relationship status: Not on file  Other Topics Concern  . Not on file  Social History Narrative   Works at [employer] - husband died of lung cancer      No regular exercise   Additional Social History:                         Sleep: Fair  Appetite:  Good  Current Medications: Current Outpatient Medications  Medication Sig Dispense Refill  . B Complex-C (SUPER B COMPLEX PO) Take by mouth.    . bisoprolol-hydrochlorothiazide (ZIAC) 5-6.25 MG tablet TAKE 1 TABLET BY MOUTH ONCE  DAILY 90 tablet 3  . Eszopiclone 3 MG TABS Take 1 tablet (3 mg total) by mouth at bedtime as needed. Take immediately before bedtime 30 tablet 5  . levothyroxine (SYNTHROID, LEVOTHROID) 125 MCG tablet TAKE 1 TABLET BY MOUTH ONCE DAILY 90 tablet 1  . venlafaxine XR (EFFEXOR-XR) 150 MG 24 hr capsule Take 1 capsule (150 mg total) by mouth daily. 30 capsule 7  . NON FORMULARY EQUATE STOOL SOFTNER PLUS STIMULANT LAXATIVE     No current facility-administered medications for this visit.     Lab Results: No results found for this or any previous visit (from the past 48 hour(s)).  Blood Alcohol level:  No results found for: [facility]  Physical Findings: AIMS:  , ,  ,  ,    CIWA:    COWS:     Musculoskeletal: Strength & Muscle Tone: within normal limits Gait & Station: normal Patient leans: N/A  Psychiatric Specialty Exam: ROS  Blood pressure 112/72, pulse 76, height 5\' 2"  (1.575 m), weight 201 lb (91.2 kg), SpO2 94 %.Body mass index is 36.76 kg/m.  General Appearance: Fairly Groomed  Engineer, water::  Good  Speech:  Clear and Coherent  Volume:  Normal  Mood:  Euthymic  Affect:  Blunt  Thought Process:  Coherent  Orientation:  Full (Time, Place, and Person)  Thought Content:  WDL  Suicidal Thoughts:  No  Homicidal Thoughts:  No  Memory:  NA  Judgement:  NA  Insight:  Good  Psychomotor Activity:  Decreased  Concentration:  Fair  Recall:  [employer] of Knowledge:Good  Language: Fair  Akathisia:  No  Handed:  Right  AIMS (if indicated):     Assets:  Desire for Improvement  ADL's:  Intact  Cognition: WNL  Sleep:      Treatment Plan Summary: 05/05/2018, 4:09 PM  This patient is first problem is that of a major clinical depression area he continues taking Effexor as prescribed. Her second associated problem is problems sleeping. She takes [place] on a regular basisand this helps her great deal. There is that more than 50% of the time talking about her medications and counseling her  and giving her education and how the medications work. She denies any significant physical problems at this time. Her thoughts are clear organized and connected. Her symptoms are mild in  nature. He is out of baseline.

## 2018-05-05 NOTE — Progress Notes (Signed)
Patient ID: Paige [name], female   DOB: 28-Jun-1951, 67 y.o.   MRN: [id number] Patient ID: Paige [name], female   DOB: 1951-03-08, 67 y.o.   MRN: [id number] [facility] MD Progress Note  05/05/2018 4:08 PM [name]   Pa time.st Medical History:   MRN:  [id number] Subjective: Mild depression Today the patient isn't as well as she's been in the past. 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 In about a month patient will be off of her bankruptcy process. The patient denies any worsening depression. She is chronic dysthymia. Unfortunately she's quite isolated. She is sleeping and eating well as long she takes [place]. She takes her to record. She drinks no alcohol at all. 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 The patient denies the use of alcohol or drugs. She takes her medicine as prescribed and claims it is beneficial. Past Medical History:  Diagnosis Date  . ADD (attention deficit disorder)    Pt has hard time focusing  . Anxiety   . Arthritis   . Carpal tunnel syndrome   . Colon polyp   . Constipation   . Depression   . Gallstones   . GERD (gastroesophageal reflux disease)   . Hypertension   . Status post dilation of esophageal narrowing   . Thyroid disease     Past Surgical History:  Procedure Laterality Date  . ABDOMINAL HYSTERECTOMY    . CARPAL TUNNEL RELEASE Left   . CHOLECYSTECTOMY    . CLOSED REDUCTION HAND FRACTURE     right hand  . fx leg    . NASAL SINUS SURGERY    . SHOULDER SURGERY     reattac tendon; right shoulder  . TIBIA FRACTURE SURGERY     steel rod right leg  . TUBAL LIGATION     Family History:  Family History  Problem Relation Age of Onset  . Hyperlipidemia Mother   . Colon cancer Mother        22  . Lung cancer Father   . Cancer Paternal Grandfather        mets  . Leukemia Maternal  Aunt    Family Psychiatric  History:  Social History:  Social History   Substance and Sexual Activity  Alcohol Use No     Social History   Substance and Sexual Activity  Drug Use No    Social History   Socioeconomic History  . Marital status: Widowed    Spouse name: Not on file  . Number of children: 2  . Years of education: Not on file  . Highest education level: Not on file  Occupational History  . Occupation: retired    Fish farm manager: UNEMPLOYED  . Occupation: Conservation officer, historic buildings: [place]  . Financial resource strain: Not hard at all  . Food insecurity:    Worry: Never true    Inability: Never true  . Transportation needs:    Medical: No    Non-medical: No  Tobacco Use  . Smoking status: Never Smoker  . Smokeless tobacco: Never Used  Substance and Sexual Activity  . Alcohol use: No  . Drug use: No  . Sexual activity: Never  Lifestyle  . Physical activity:    Days per week: 0 days    Minutes per session:  0 min  . Stress: Only a little  Relationships  . Social connections:    Talks on phone: More than three times a week    Gets together: More than three times a week    Attends religious service: Not on file    Active member of club or organization: Not on file    Attends meetings of clubs or organizations: Not on file    Relationship status: Not on file  Other Topics Concern  . Not on file  Social History Narrative   Works at [employer] - husband died of lung cancer      No regular exercise   Additional Social History:                         Sleep: Fair  Appetite:  Good  Current Medications: Current Outpatient Medications  Medication Sig Dispense Refill  . B Complex-C (SUPER B COMPLEX PO) Take by mouth.    . bisoprolol-hydrochlorothiazide (ZIAC) 5-6.25 MG tablet TAKE 1 TABLET BY MOUTH ONCE DAILY 90 tablet 3  . Eszopiclone 3 MG TABS Take 1 tablet (3 mg total) by mouth at bedtime  as needed. Take immediately before bedtime 30 tablet 5  . levothyroxine (SYNTHROID, LEVOTHROID) 125 MCG tablet TAKE 1 TABLET BY MOUTH ONCE DAILY 90 tablet 1  . venlafaxine XR (EFFEXOR-XR) 150 MG 24 hr capsule Take 1 capsule (150 mg total) by mouth daily. 30 capsule 7  . NON FORMULARY EQUATE STOOL SOFTNER PLUS STIMULANT LAXATIVE     No current facility-administered medications for this visit.     Lab Results: No results found for this or any previous visit (from the past 48 hour(s)).  Blood Alcohol level:  No results found for: [facility]  Physical Findings: AIMS:  , ,  ,  ,    CIWA:    COWS:     Musculoskeletal: Strength & Muscle Tone: within normal limits Gait & Station: normal Patient leans: N/A  Psychiatric Specialty Exam: ROS  Blood pressure 112/72, pulse 76, height 5\' 2"  (1.575 m), weight 201 lb (91.2 kg), SpO2 94 %.Body mass index is 36.76 kg/m.  General Appearance: Fairly Groomed  Engineer, water::  Good  Speech:  Clear and Coherent  Volume:  Normal  Mood:  Euthymic  Affect:  Blunt  Thought Process:  Coherent  Orientation:  Full (Time, Place, and Person)  Thought Content:  WDL  Suicidal Thoughts:  No  Homicidal Thoughts:  No  Memory:  NA  Judgement:  NA  Insight:  Good  Psychomotor Activity:  Decreased  Concentration:  Fair  Recall:  [employer] of Knowledge:Good  Language: Fair  Akathisia:  No  Handed:  Right  AIMS (if indicated):     Assets:  Desire for Improvement  ADL's:  Intact  Cognition: WNL  Sleep:      Treatment Plan Summary: 05/05/2018, 4:08 PM  At this time the patient continue taking Lunesta 3 mg taking Effexor 150 mg.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 At that time we will more thorough evaluation this time I believe she is stable. She certainly is not suicidal.

## 2018-06-14 DIAGNOSIS — H52223 Regular astigmatism, bilateral: Secondary | ICD-10-CM | POA: Diagnosis not present

## 2018-06-14 DIAGNOSIS — H524 Presbyopia: Secondary | ICD-10-CM | POA: Diagnosis not present

## 2018-06-14 DIAGNOSIS — H5213 Myopia, bilateral: Secondary | ICD-10-CM | POA: Diagnosis not present

## 2018-07-04 NOTE — Patient Instructions (Addendum)
  Tests ordered today. Your results will be released to MyChart (or called to you) after review, usually within 72hours after test completion. If any changes need to be made, you will be notified at that same time.  Flu immunization administered today.    Medications reviewed and updated.  Changes include :   none    Please followup in 6 months   

## 2018-07-04 NOTE — Progress Notes (Signed)
Subjective:    Patient ID: Paige [name], female    DOB: February 07, 1951, 67 y.o.   MRN: [id number]  HPI The patient is here for follow up.  Hypertension: She is taking her medication daily. She is compliant with a low sodium diet.  If she walks very fast she will experience some shortness of breath, but she is not exercising regularly-this is not new.  She denies chest pain, palpitations and regular headaches. She is not exercising regularly.      Hypothyroidism:  She is taking her medication daily.  She denies any recent changes in energy or weight that are unexplained.   Hyperglycemia:   She is sometimes compliant with a low sugar/carbohydrate diet.  She is not exercising regularly.  Hyperlipidemia: She is not currently on any medication.  She eats fairly well, but states she could do better.  She is not exercising regularly.  Depression, anxiety, sleep difficulties: she follows with Dr Casimiro Needle.  Her left knee pops when she gets up - it is occurring more often.  She denies pain or swelling in her knees.  She is not exercising.  She does have joint stiffness when she first starts moving.  Medications and allergies reviewed with patient and updated if appropriate.  Patient Active Problem List   Diagnosis Date Noted  . Chronic bilateral low back pain without sciatica 07/30/2017  . Hyperlipidemia 07/04/2017  . Piriformis syndrome of left side 04/29/2016  . Left hip pain 03/27/2016  . Lumbar radiculopathy 03/19/2016  . Neck pain on right side 01/16/2016  . Depression 06/09/2014  . AK (actinic keratosis) 10/19/2012  . CARCINOMA, BASAL CELL, BACK 11/16/2009  . ANXIETY DISORDER, GENERALIZED 07/30/2009  . BACK PAIN WITH RADICULOPATHY 05/28/2009  . Unspecified thrombosed hemorrhoids 10/17/2008  . CONSTIPATION, INTERMITTENT 12/20/2007  . OSTEOARTHRITIS 12/20/2007  . Hypothyroidism 06/08/2007  . Attention deficit hyperactivity disorder (ADHD) 06/08/2007  . Carpal tunnel syndrome  06/08/2007  . Essential hypertension 06/08/2007    Current Outpatient Medications on File Prior to Visit  Medication Sig Dispense Refill  . B Complex-C (SUPER B COMPLEX PO) Take by mouth.    . bisoprolol-hydrochlorothiazide (ZIAC) 5-6.25 MG tablet TAKE 1 TABLET BY MOUTH ONCE DAILY 90 tablet 3  . Eszopiclone 3 MG TABS Take 1 tablet (3 mg total) by mouth at bedtime as needed. Take immediately before bedtime 30 tablet 5  . levothyroxine (SYNTHROID, LEVOTHROID) 125 MCG tablet TAKE 1 TABLET BY MOUTH ONCE DAILY 90 tablet 1  . NON FORMULARY EQUATE STOOL SOFTNER PLUS STIMULANT LAXATIVE    . venlafaxine XR (EFFEXOR-XR) 150 MG 24 hr capsule Take 1 capsule (150 mg total) by mouth daily. 30 capsule 7   No current facility-administered medications on file prior to visit.     Past Medical History:  Diagnosis Date  . ADD (attention deficit disorder)    Pt has hard time focusing  . Anxiety   . Arthritis   . Carpal tunnel syndrome   . Colon polyp   . Constipation   . Depression   . Gallstones   . GERD (gastroesophageal reflux disease)   . Hypertension   . Status post dilation of esophageal narrowing   . Thyroid disease     Past Surgical History:  Procedure Laterality Date  . ABDOMINAL HYSTERECTOMY    . CARPAL TUNNEL RELEASE Left   . CHOLECYSTECTOMY    . CLOSED REDUCTION HAND FRACTURE     right hand  . fx leg    . NASAL  SINUS SURGERY    . SHOULDER SURGERY     reattac tendon; right shoulder  . TIBIA FRACTURE SURGERY     steel rod right leg  . TUBAL LIGATION      Social History   Socioeconomic History  . Marital status: Widowed    Spouse name: Not on file  . Number of children: 2  . Years of education: Not on file  . Highest education level: Not on file  Occupational History  . Occupation: retired    Fish farm manager: UNEMPLOYED  . 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 Financial resource strain: Not hard at all  . Food insecurity:    Worry:  Never true    Inability: Never true  . Transportation needs:    Medical: No    Non-medical: No  Tobacco Use  . Smoking status: Never Smoker  . Smokeless tobacco: Never Used  Substance and Sexual Activity  . Alcohol use: No  . Drug use: No  . Sexual activity: Never  Lifestyle  . Physical activity:    Days per week: 0 days    Minutes per session: 0 min  . Stress: Only a little  Relationships  . Social connections:    Talks on phone: More than three times a week    Gets together: More than three times a week    Attends religious service: Not on file    Active member of club or organization: Not on file    Attends meetings of clubs or organizations: Not on file    Relationship status: Not on file  Other Topics Concern  . Not on file  Social History Narrative   Works at [employer] - husband died of lung cancer      No regular exercise    Family History  Problem Relation Age of Onset  . Hyperlipidemia Mother   . Colon cancer Mother        55  . Lung cancer Father   . Cancer Paternal Grandfather        mets  . Leukemia Maternal Aunt     Review of Systems  Constitutional: Negative for chills and fever.  HENT: Positive for hearing loss.   Respiratory: Positive for shortness of breath (with strenous exertion). Negative for cough and wheezing.   Cardiovascular: Positive for leg swelling (mild at times). Negative for chest pain and palpitations.  Musculoskeletal: Negative for arthralgias.       Knees pop  Neurological: Negative for light-headedness and headaches.       Objective:   Vitals:   07/05/18 1519  BP: 126/74  Pulse: 78  Resp: 16  Temp: 98.2 F (36.8 C)  SpO2: 98%   BP Readings from Last 3 Encounters:  07/05/18 126/74  02/11/18 106/62  07/30/17 122/66   Wt Readings from Last 3 Encounters:  07/05/18 198 lb (89.8 kg)  02/11/18 201 lb (91.2 kg)  07/30/17 196 lb (88.9 kg)   Body mass index is 36.21 kg/m.   Physical  Exam    Constitutional: Appears well-developed and well-nourished. No distress.  HENT:  Head: Normocephalic and atraumatic.  Neck: Neck supple. No tracheal deviation present. No thyromegaly present.  No cervical lymphadenopathy Cardiovascular: Normal rate, regular rhythm and normal heart sounds.   No murmur heard. No carotid bruit .  No edema Pulmonary/Chest: Effort normal and breath sounds normal. No respiratory distress. No has no wheezes. No rales.  Skin: Skin is warm and dry. Not diaphoretic.  Psychiatric: Normal mood and affect. Behavior is normal.      Assessment & Plan:    See Problem List for Assessment and Plan of chronic medical problems.

## 2018-07-05 ENCOUNTER — Ambulatory Visit (INDEPENDENT_AMBULATORY_CARE_PROVIDER_SITE_OTHER): Payer: PPO | Admitting: Internal Medicine

## 2018-07-05 ENCOUNTER — Encounter: Payer: Self-pay | Admitting: Internal Medicine

## 2018-07-05 ENCOUNTER — Other Ambulatory Visit (INDEPENDENT_AMBULATORY_CARE_PROVIDER_SITE_OTHER): Payer: PPO

## 2018-07-05 VITALS — BP 126/74 | HR 78 | Temp 98.2°F | Resp 16 | Ht 62.0 in | Wt 198.0 lb

## 2018-07-05 DIAGNOSIS — I10 Essential (primary) hypertension: Secondary | ICD-10-CM | POA: Diagnosis not present

## 2018-07-05 DIAGNOSIS — E119 Type 2 diabetes mellitus without complications: Secondary | ICD-10-CM | POA: Insufficient documentation

## 2018-07-05 DIAGNOSIS — Z23 Encounter for immunization: Secondary | ICD-10-CM | POA: Diagnosis not present

## 2018-07-05 DIAGNOSIS — R7303 Prediabetes: Secondary | ICD-10-CM

## 2018-07-05 DIAGNOSIS — E782 Mixed hyperlipidemia: Secondary | ICD-10-CM

## 2018-07-05 DIAGNOSIS — E039 Hypothyroidism, unspecified: Secondary | ICD-10-CM | POA: Diagnosis not present

## 2018-07-05 DIAGNOSIS — R29898 Other symptoms and signs involving the musculoskeletal system: Secondary | ICD-10-CM | POA: Insufficient documentation

## 2018-07-05 HISTORY — DX: Prediabetes: R73.03

## 2018-07-05 LAB — CBC WITH DIFFERENTIAL/PLATELET
BASOS ABS: 0 10*3/uL (ref 0.0–0.1)
Basophils Relative: 0.4 % (ref 0.0–3.0)
Eosinophils Absolute: 0.1 10*3/uL (ref 0.0–0.7)
Eosinophils Relative: 1 % (ref 0.0–5.0)
HEMATOCRIT: 38.4 % (ref 36.0–46.0)
Hemoglobin: 12.9 g/dL (ref 12.0–15.0)
LYMPHS PCT: 25.3 % (ref 12.0–46.0)
Lymphs Abs: 2.3 10*3/uL (ref 0.7–4.0)
MCHC: 33.5 g/dL (ref 30.0–36.0)
MCV: 82.1 fl (ref 78.0–100.0)
Monocytes Absolute: 0.7 10*3/uL (ref 0.1–1.0)
Monocytes Relative: 7.3 % (ref 3.0–12.0)
Neutro Abs: 5.9 10*3/uL (ref 1.4–7.7)
Neutrophils Relative %: 66 % (ref 43.0–77.0)
Platelets: 226 10*3/uL (ref 150.0–400.0)
RBC: 4.68 Mil/uL (ref 3.87–5.11)
RDW: 13.9 % (ref 11.5–15.5)
WBC: 8.9 10*3/uL (ref 4.0–10.5)

## 2018-07-05 LAB — COMPREHENSIVE METABOLIC PANEL
ALBUMIN: 4.7 g/dL (ref 3.5–5.2)
ALK PHOS: 97 U/L (ref 39–117)
ALT: 9 U/L (ref 0–35)
AST: 21 U/L (ref 0–37)
BUN: 19 mg/dL (ref 6–23)
CO2: 29 mEq/L (ref 19–32)
CREATININE: 0.66 mg/dL (ref 0.40–1.20)
Calcium: 10.4 mg/dL (ref 8.4–10.5)
Chloride: 101 mEq/L (ref 96–112)
GFR: 94.56 mL/min (ref >60.00)
Glucose, Bld: 112 mg/dL — ABNORMAL HIGH (ref 70–99)
Potassium: 3.5 mEq/L (ref 3.5–5.1)
SODIUM: 141 meq/L (ref 135–145)
TOTAL PROTEIN: 8.1 g/dL (ref 6.0–8.3)
Total Bilirubin: 0.3 mg/dL (ref 0.2–1.2)

## 2018-07-05 LAB — LIPID PANEL
CHOL/HDL RATIO: 4
Cholesterol: 239 mg/dL — ABNORMAL HIGH (ref 0–200)
HDL: 61 mg/dL (ref >39.00)
LDL Cholesterol: 139 mg/dL — ABNORMAL HIGH (ref 0–99)
NONHDL: 177.79
TRIGLYCERIDES: 192 mg/dL — AB (ref 0.0–149.0)
VLDL: 38.4 mg/dL (ref 0.0–40.0)

## 2018-07-05 LAB — TSH: TSH: 0.12 u[IU]/mL — ABNORMAL LOW (ref 0.35–4.50)

## 2018-07-05 LAB — HEMOGLOBIN A1C: Hgb A1c MFr Bld: 6.1 % (ref 4.6–6.5)

## 2018-07-05 NOTE — Assessment & Plan Note (Signed)
BP well controlled Current regimen effective and well tolerated Continue current medications at current doses cmp  

## 2018-07-05 NOTE — Assessment & Plan Note (Signed)
Left knee Without pain Encourage regular exercise No need for further evaluation-discussed this may be early arthritis

## 2018-07-05 NOTE — Assessment & Plan Note (Signed)
Not currently on medication Check lipid panel, CMP Start regular exercise

## 2018-07-05 NOTE — Assessment & Plan Note (Signed)
Sugars have been on the high side of normal for a while-looking back several years ago her A1c was elevated Check A1c Stressed the importance of low sugar/carbohydrate diet Stressed regular exercise Follow-up in 6 months

## 2018-07-05 NOTE — Assessment & Plan Note (Signed)
Clinically euthyroid Check tsh  Titrate med dose if needed  

## 2018-07-09 MED ORDER — LEVOTHYROXINE SODIUM 112 MCG PO TABS: 112.0000 ug | ORAL_TABLET | Freq: Every day | ORAL | 0 refills | Status: DC

## 2018-07-27 ENCOUNTER — Other Ambulatory Visit: Payer: Self-pay | Admitting: Internal Medicine

## 2018-09-08 ENCOUNTER — Encounter (HOSPITAL_COMMUNITY): Payer: Self-pay | Admitting: Psychiatry

## 2018-09-08 ENCOUNTER — Ambulatory Visit (INDEPENDENT_AMBULATORY_CARE_PROVIDER_SITE_OTHER): Payer: PPO | Admitting: Psychiatry

## 2018-09-08 VITALS — BP 100/66 | HR 88 | Ht 62.0 in | Wt 202.0 lb

## 2018-09-08 DIAGNOSIS — F341 Dysthymic disorder: Secondary | ICD-10-CM

## 2018-09-08 MED ORDER — VENLAFAXINE HCL ER 150 MG PO CP24: 150.0000 mg | ORAL_CAPSULE | Freq: Every day | ORAL | 7 refills | Status: DC

## 2018-09-08 MED ORDER — ESZOPICLONE 3 MG PO TABS: 3.0000 mg | ORAL_TABLET | Freq: Every evening | ORAL | 5 refills | Status: DC | PRN

## 2018-09-08 NOTE — Progress Notes (Signed)
Patient ID: Paige [name], female   DOB: March 10, 1951, 68 y.o.   MRN: [id number] Patient ID: Paige [name], female   DOB: 20-Aug-1950, 68 y.o.   MRN: [id number] [facility] MD Progress Note  09/08/2018 4:04 PM [name]   Pa time.st Medical History:   MRN:  [id number] Diagnosis major depression in remission  Chief complaint depressed mood state  At this time this patient has been treated for years here for depression is at her baseline.  She denies daily depression.  She is very slow sedentary lifestyle.  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  Generally she is sleeping and eating pretty well.  Her energy level is only fair.  She has no problems thinking and concentrating.  Finally her financial status is improved.  She been paying off her bankruptcy for the last few years.  For the first time starting this year she will have more money and has to figure out how to spend it.  The patient has 2 sons who are doing well.  She actually had a fairly good Christmas.  This patient drinks no alcohol uses no drugs.  She is functioning fairly well.  She works 40 hours.  The patient's health is good.  She denies chest pain shortness of breath or any physical problems.  Financially she is stable at this time.  She does have somewhat of an isolated lifestyle.  She functions well enough to go to work come home and take care of her home.  Clearly with more money job and opportunity to make some changes.  The patient has never been psychotic. Past Medical History:  Diagnosis Date  . ADD (attention deficit disorder)    Pt has hard time focusing  . Anxiety   . Arthritis   . Carpal tunnel syndrome   . Colon polyp   . Constipation   . Depression   . Gallstones   . GERD (gastroesophageal reflux disease)   . Hypertension   . Status post dilation of esophageal narrowing   . Thyroid disease     Past Surgical History:  Procedure Laterality Date  . ABDOMINAL  HYSTERECTOMY    . CARPAL TUNNEL RELEASE Left   . CHOLECYSTECTOMY    . CLOSED REDUCTION HAND FRACTURE     right hand  . fx leg    . NASAL SINUS SURGERY    . SHOULDER SURGERY     reattac tendon; right shoulder  . TIBIA FRACTURE SURGERY     steel rod right leg  . TUBAL LIGATION     Family History:  Family History  Problem Relation Age of Onset  . Hyperlipidemia Mother   . Colon cancer Mother        51  . Lung cancer Father   . Cancer Paternal Grandfather        mets  . Leukemia Maternal Aunt    Family Psychiatric  History:  Social History:  Social History   Substance and Sexual Activity  Alcohol Use No     Social History   Substance and Sexual Activity  Drug Use No    Social History   Socioeconomic History  . Marital status: Widowed    Spouse name: Not on file  . Number of children: 2  . Years of education: Not on file  . Highest education level: Not on file  Occupational History  . Occupation: retired    Fish farm manager: UNEMPLOYED  . Occupation: cafeteria  Employer: [employer]  Social Needs  . Financial resource strain: Not hard at all  . Food insecurity:    Worry: Never true    Inability: Never true  . Transportation needs:    Medical: No    Non-medical: No  Tobacco Use  . Smoking status: Never Smoker  . Smokeless tobacco: Never Used  Substance and Sexual Activity  . Alcohol use: No  . Drug use: No  . Sexual activity: Never  Lifestyle  . Physical activity:    Days per week: 0 days    Minutes per session: 0 min  . Stress: Only a little  Relationships  . Social connections:    Talks on phone: More than three times a week    Gets together: More than three times a week    Attends religious service: Not on file    Active member of club or organization: Not on file    Attends meetings of clubs or organizations: Not on file    Relationship status: Not on file  Other Topics Concern  . Not on file  Social History Narrative   Works at  [employer] - husband died of lung cancer      No regular exercise   Additional Social History:                         Sleep: Fair  Appetite:  Good  Current Medications: Current Outpatient Medications  Medication Sig Dispense Refill  . B Complex-C (SUPER B COMPLEX PO) Take by mouth.    . bisoprolol-hydrochlorothiazide (ZIAC) 5-6.25 MG tablet TAKE 1 TABLET BY MOUTH ONCE DAILY 90 tablet 1  . Eszopiclone 3 MG TABS Take 1 tablet (3 mg total) by mouth at bedtime as needed. Take immediately before bedtime 30 tablet 5  . levothyroxine (SYNTHROID, LEVOTHROID) 112 MCG tablet Take 1 tablet (112 mcg total) by mouth daily. 90 tablet 0  . venlafaxine XR (EFFEXOR-XR) 150 MG 24 hr capsule Take 1 capsule (150 mg total) by mouth daily. 30 capsule 7  . NON FORMULARY EQUATE STOOL SOFTNER PLUS STIMULANT LAXATIVE     No current facility-administered medications for this visit.     Lab Results: No results found for this or any previous visit (from the past 48 hour(s)).  Blood Alcohol level:  No results found for: [facility]  Physical Findings: AIMS:  , ,  ,  ,    CIWA:    COWS:     Musculoskeletal: Strength & Muscle Tone: within normal limits Gait & Station: normal Patient leans: N/A  Psychiatric Specialty Exam: ROS  Blood pressure 100/66, pulse 88, height 5\' 2"  (1.575 m), weight 202 lb (91.6 kg), SpO2 98 %.Body mass index is 36.95 kg/m.  General Appearance: Fairly Groomed  Engineer, water::  Good  Speech:  Clear and Coherent  Volume:  Normal  Mood:  Euthymic  Affect:  Blunt  Thought Process:  Coherent  Orientation:  Full (Time, Place, and Person)  Thought Content:  WDL  Suicidal Thoughts:  No  Homicidal Thoughts:  No  Memory:  NA  Judgement:  NA  Insight:  Good  Psychomotor Activity:  Decreased  Concentration:  Fair  Recall:  [employer] of Knowledge:Good  Language: Fair  Akathisia:  No  Handed:  Right  AIMS (if indicated):     Assets:  Desire for  Improvement  ADL's:  Intact  Cognition: WNL  Sleep:  Treatment Plan Summary: 09/08/2018, 4:04 PM  This patient is doing fairly well.  She has 1 chronic problem have a history of major depression.  She is sleeping fairly well taking Lunesta and does well taking Effexor 150 mg 2 a day.  She is functioning well enough and does not actually have any complaints.  She is happy with the way life is even though she says she like to do more with her life.  Her energy level is adequate.  Her level of severity is minimal.  The patient does well just taking Effexor and the Lunesta has clearly been helpful for her.  She will return to see me in 5 months for a 15-minute visit.

## 2018-09-14 DIAGNOSIS — L820 Inflamed seborrheic keratosis: Secondary | ICD-10-CM | POA: Diagnosis not present

## 2018-09-14 DIAGNOSIS — D1801 Hemangioma of skin and subcutaneous tissue: Secondary | ICD-10-CM | POA: Diagnosis not present

## 2018-09-14 DIAGNOSIS — D485 Neoplasm of uncertain behavior of skin: Secondary | ICD-10-CM | POA: Diagnosis not present

## 2018-09-14 DIAGNOSIS — L821 Other seborrheic keratosis: Secondary | ICD-10-CM | POA: Diagnosis not present

## 2018-09-14 DIAGNOSIS — L720 Epidermal cyst: Secondary | ICD-10-CM | POA: Diagnosis not present

## 2018-09-14 DIAGNOSIS — L570 Actinic keratosis: Secondary | ICD-10-CM | POA: Diagnosis not present

## 2018-09-14 DIAGNOSIS — C44712 Basal cell carcinoma of skin of right lower limb, including hip: Secondary | ICD-10-CM | POA: Diagnosis not present

## 2018-09-14 DIAGNOSIS — D0462 Carcinoma in situ of skin of left upper limb, including shoulder: Secondary | ICD-10-CM | POA: Diagnosis not present

## 2018-09-14 DIAGNOSIS — C44519 Basal cell carcinoma of skin of other part of trunk: Secondary | ICD-10-CM | POA: Diagnosis not present

## 2018-09-14 DIAGNOSIS — D2272 Melanocytic nevi of left lower limb, including hip: Secondary | ICD-10-CM | POA: Diagnosis not present

## 2018-09-14 DIAGNOSIS — L905 Scar conditions and fibrosis of skin: Secondary | ICD-10-CM | POA: Diagnosis not present

## 2018-09-14 DIAGNOSIS — C44619 Basal cell carcinoma of skin of left upper limb, including shoulder: Secondary | ICD-10-CM | POA: Diagnosis not present

## 2018-10-21 ENCOUNTER — Other Ambulatory Visit: Payer: Self-pay | Admitting: Internal Medicine

## 2018-10-26 DIAGNOSIS — D485 Neoplasm of uncertain behavior of skin: Secondary | ICD-10-CM | POA: Diagnosis not present

## 2018-10-26 DIAGNOSIS — C44519 Basal cell carcinoma of skin of other part of trunk: Secondary | ICD-10-CM | POA: Diagnosis not present

## 2018-10-26 DIAGNOSIS — Z85828 Personal history of other malignant neoplasm of skin: Secondary | ICD-10-CM | POA: Diagnosis not present

## 2018-10-26 DIAGNOSIS — D0461 Carcinoma in situ of skin of right upper limb, including shoulder: Secondary | ICD-10-CM | POA: Diagnosis not present

## 2018-12-20 ENCOUNTER — Other Ambulatory Visit: Payer: Self-pay

## 2018-12-20 ENCOUNTER — Encounter: Payer: Self-pay | Admitting: Internal Medicine

## 2018-12-20 ENCOUNTER — Ambulatory Visit (INDEPENDENT_AMBULATORY_CARE_PROVIDER_SITE_OTHER): Payer: PPO | Admitting: Internal Medicine

## 2018-12-20 ENCOUNTER — Ambulatory Visit: Payer: Self-pay | Admitting: *Deleted

## 2018-12-20 VITALS — BP 136/72 | HR 62 | Temp 98.4°F | Resp 16 | Ht 62.0 in | Wt 203.8 lb

## 2018-12-20 DIAGNOSIS — L03211 Cellulitis of face: Secondary | ICD-10-CM

## 2018-12-20 MED ORDER — CEFTRIAXONE SODIUM 1 G IJ SOLR
1.0000 g | Freq: Once | INTRAMUSCULAR | Status: AC
  Administered 2018-12-20: 1 g via INTRAMUSCULAR

## 2018-12-20 MED ORDER — AMOXICILLIN-POT CLAVULANATE 875-125 MG PO TABS: 1.0000 | ORAL_TABLET | Freq: Two times a day (BID) | ORAL | 0 refills | Status: DC

## 2018-12-20 NOTE — Telephone Encounter (Signed)
Patient is scheduled to be seen today.Marland Kitchen She was unable to do a virtual visit.

## 2018-12-20 NOTE — Telephone Encounter (Signed)
Patient is having swelling with L eye- Patient states she has been working outside- she has swelling that started under eye near nose- it swelled under the eye up over the top of eye. Cheek is puffy. Patient did rub some vasaline and cold pack on it. She has not taken benadryl or anything to stop a reaction to possible insect bite. Patient states her coworkers think she got bit by something.  Reason for Disposition . [1] Red or very tender (to touch) area AND [2] getting larger over 48 hours after the bite  Answer Assessment - Initial Assessment Questions 1. ONSET: "When did the swelling start?" (e.g., minutes, hours, days)     Saturday afternoon 2. LOCATION: "What part of the eyelids is swollen?"     Left- bottom area of eye/face 3. SEVERITY: "How swollen is it?"     puffy 4. ITCHING: "Is there any itching?" If so, ask: "How much?"   (Scale 1-10; mild, moderate or severe)     mild 5. PAIN: "Is the swelling painful to touch?" If so, ask: "How painful is it?"   (Scale 1-10; mild, moderate or severe)     Under at nose it is sore 6. FEVER: "Do you have a fever?" If so, ask: "What is it, how was it measured, and when did it start?"      Not sure 7. CAUSE: "What do you think is causing the swelling?"     Not sure 8. RECURRENT SYMPTOM: "Have you had eyelid swelling before?" If so, ask: "When was the last time?" "What happened that time?"     no 9. OTHER SYMPTOMS: "Do you have any other symptoms?" (e.g., blurred vision, eye discharge, rash, runny nose)     no 10. PREGNANCY: "Is there any chance you are pregnant?" "When was your last menstrual period?"       n/a  Protocols used: INSECT BITE-A-AH, EYE - [facility]

## 2018-12-20 NOTE — Assessment & Plan Note (Addendum)
Cellulitis - ? From bug bite or scratch mark with associated face and eyelid swelling Ceftriaxone 2 g IM x 1 now Augmentin orally x 10 days - to start tonight Cold compresses Monitor closely - she will call if there are any questions or if her symptoms are not improving

## 2018-12-20 NOTE — Patient Instructions (Addendum)
You received an injection of an antibiotic today.  Start the Augmentin tonight and then take twice a day until you complete the entire course.    You can apply cold compresses.   Call immediately if the infection looks worse.     Cellulitis, Adult  Cellulitis is a skin infection. The infected area is usually warm, red, swollen, and tender. This condition occurs most often in the arms and lower legs. The infection can travel to the muscles, blood, and underlying tissue and become serious. It is very important to get treated for this condition. What are the causes? Cellulitis is caused by bacteria. The bacteria enter through a break in the skin, such as a cut, burn, insect bite, open sore, or crack. What increases the risk? This condition is more likely to occur in people who:  Have a weak body defense system (immune system).  Have open wounds on the skin, such as cuts, Wai Minotti, bites, and scrapes. Bacteria can enter the body through these open wounds.  Are older than 69 years of age.  Have diabetes.  Have a type of long-lasting (chronic) liver disease (cirrhosis) or kidney disease.  Are obese.  Have a skin condition such as: ? Itchy rash (eczema). ? Slow movement of blood in the veins (venous stasis). ? Fluid buildup below the skin (edema).  Have had radiation therapy.  Use IV drugs. What are the signs or symptoms? Symptoms of this condition include:  Redness, streaking, or spotting on the skin.  Swollen area of the skin.  Tenderness or pain when an area of the skin is touched.  Warm skin.  A fever.  Chills.  Blisters. How is this diagnosed? This condition is diagnosed based on a medical history and physical exam. You may also have tests, including:  Blood tests.  Imaging tests. How is this treated? Treatment for this condition may include:  Medicines, such as antibiotic medicines or medicines to treat allergies (antihistamines).  Supportive care, such as  rest and application of cold or warm cloths (compresses) to the skin.  Hospital care, if the condition is severe. The infection usually starts to get better within 1-2 days of treatment. Follow these instructions at home:  Medicines  Take over-the-counter and prescription medicines only as told by your health care provider.  If you were prescribed an antibiotic medicine, take it as told by your health care provider. Do not stop taking the antibiotic even if you start to feel better. General instructions  Drink enough fluid to keep your urine pale yellow.  Do not touch or rub the infected area.  Raise (elevate) the infected area above the level of your heart while you are sitting or lying down.  Apply warm or cold compresses to the affected area as told by your health care provider.  Keep all follow-up visits as told by your health care provider. This is important. These visits let your health care provider make sure a more serious infection is not developing. Contact a health care provider if:  You have a fever.  Your symptoms do not begin to improve within 1-2 days of starting treatment.  Your bone or joint underneath the infected area becomes painful after the skin has healed.  Your infection returns in the same area or another area.  You notice a swollen bump in the infected area.  You develop new symptoms.  You have a general ill feeling (malaise) with muscle aches and pains. Get help right away if:  Your symptoms  get worse.  You feel very sleepy.  You develop vomiting or diarrhea that persists.  You notice red streaks coming from the infected area.  Your red area gets larger or turns dark in color. These symptoms may represent a serious problem that is an emergency. Do not wait to see if the symptoms will go away. Get medical help right away. Call your local emergency services (911 in the [place] Do not drive yourself to the hospital. Summary  Cellulitis is a  skin infection. This condition occurs most often in the arms and lower legs.  Treatment for this condition may include medicines, such as antibiotic medicines or antihistamines.  Take over-the-counter and prescription medicines only as told by your health care provider. If you were prescribed an antibiotic medicine, do not stop taking the antibiotic even if you start to feel better.  Contact a health care provider if your symptoms do not begin to improve within 1-2 days of starting treatment or your symptoms get worse.  Keep all follow-up visits as told by your health care provider. This is important. These visits let your health care provider make sure that a more serious infection is not developing. This information is not intended to replace advice given to you by your health care provider. Make sure you discuss any questions you have with your health care provider. Document Released: 05/14/2005 Document Revised: 12/24/2017 Document Reviewed: 12/24/2017 Elsevier Interactive Patient Education  2019 [employer].

## 2018-12-20 NOTE — Progress Notes (Signed)
Subjective:    Patient ID: Paige [name], female    DOB: 12-Oct-1950, 68 y.o.   MRN: [id number]  HPI The patient is here for an acute visit.   Right eye swelling:  It started three days ago.  It was mild redness on the left side of her nose and the next morning it was worse.  2 nights ago it started swelling.  It has gone down a little.  She was outside for an hour the day that it started and is unsure if she was bite.  She was wearing her glasses and a mask the entire day.  Her mask is frequently falling down and she is adjusting it.  She denies other possible causes.    She denies fever/chills.  She denies a wound.  He denies sore throat, sinus pain and throat swelling.  She denies headaches, changes in vision.   Medications and allergies reviewed with patient and updated if appropriate.  Patient Active Problem List   Diagnosis Date Noted  . Prediabetes 07/05/2018  . Popping sound of knee joint 07/05/2018  . Chronic bilateral low back pain without sciatica 07/30/2017  . Hyperlipidemia 07/04/2017  . Piriformis syndrome of left side 04/29/2016  . Left hip pain 03/27/2016  . Lumbar radiculopathy 03/19/2016  . Neck pain on right side 01/16/2016  . Depression 06/09/2014  . AK (actinic keratosis) 10/19/2012  . CARCINOMA, BASAL CELL, BACK 11/16/2009  . ANXIETY DISORDER, GENERALIZED 07/30/2009  . BACK PAIN WITH RADICULOPATHY 05/28/2009  . Unspecified thrombosed hemorrhoids 10/17/2008  . CONSTIPATION, INTERMITTENT 12/20/2007  . OSTEOARTHRITIS 12/20/2007  . Hypothyroidism 06/08/2007  . Attention deficit hyperactivity disorder (ADHD) 06/08/2007  . Carpal tunnel syndrome 06/08/2007  . Essential hypertension 06/08/2007    Current Outpatient Medications on File Prior to Visit  Medication Sig Dispense Refill  . B Complex-C (SUPER B COMPLEX PO) Take by mouth.    . bisoprolol-hydrochlorothiazide (ZIAC) 5-6.25 MG tablet TAKE 1 TABLET BY MOUTH ONCE DAILY 90 tablet 1  . Eszopiclone 3  MG TABS Take 1 tablet (3 mg total) by mouth at bedtime as needed. Take immediately before bedtime 30 tablet 5  . levothyroxine (SYNTHROID, LEVOTHROID) 112 MCG tablet TAKE 1 TABLET BY MOUTH ONCE DAILY 90 tablet 0  . venlafaxine XR (EFFEXOR-XR) 150 MG 24 hr capsule Take 1 capsule (150 mg total) by mouth daily. 30 capsule 7   No current facility-administered medications on file prior to visit.     Past Medical History:  Diagnosis Date  . ADD (attention deficit disorder)    Pt has hard time focusing  . Anxiety   . Arthritis   . Carpal tunnel syndrome   . Colon polyp   . Constipation   . Depression   . Gallstones   . GERD (gastroesophageal reflux disease)   . Hypertension   . Status post dilation of esophageal narrowing   . Thyroid disease     Past Surgical History:  Procedure Laterality Date  . ABDOMINAL HYSTERECTOMY    . CARPAL TUNNEL RELEASE Left   . CHOLECYSTECTOMY    . CLOSED REDUCTION HAND FRACTURE     right hand  . fx leg    . NASAL SINUS SURGERY    . SHOULDER SURGERY     reattac tendon; right shoulder  . TIBIA FRACTURE SURGERY     steel rod right leg  . TUBAL LIGATION      Social History   Socioeconomic History  . Marital status: Widowed  Spouse name: Not on file  . Number of children: 2  . Years of education: Not on file  . Highest education level: Not on file  Occupational History  . Occupation: retired    Fish farm manager: UNEMPLOYED  . Occupation: Conservation officer, historic buildings: [employer]  . Financial resource strain: Not hard at all  . Food insecurity:    Worry: Never true    Inability: Never true  . Transportation needs:    Medical: No    Non-medical: No  Tobacco Use  . Smoking status: Never Smoker  . Smokeless tobacco: Never Used  Substance and Sexual Activity  . Alcohol use: No  . Drug use: No  . Sexual activity: Never  Lifestyle  . Physical activity:    Days per week: 0 days    Minutes per session: 0 min  . Stress: Only  a little  Relationships  . Social connections:    Talks on phone: More than three times a week    Gets together: More than three times a week    Attends religious service: Not on file    Active member of club or organization: Not on file    Attends meetings of clubs or organizations: Not on file    Relationship status: Not on file  Other Topics Concern  . Not on file  Social History Narrative   Works at [employer] - husband died of lung cancer      No regular exercise    Family History  Problem Relation Age of Onset  . Hyperlipidemia Mother   . Colon cancer Mother        64  . Lung cancer Father   . Cancer Paternal Grandfather        mets  . Leukemia Maternal Aunt     Review of Systems  Constitutional: Negative for chills and fever.  HENT: Negative for sinus pain, sore throat and trouble swallowing.        No throat swelling  Eyes: Negative for pain and visual disturbance.  Respiratory: Negative for cough, shortness of breath and wheezing.   Cardiovascular: Positive for leg swelling (mild, occasional). Negative for chest pain and palpitations.  Skin: Positive for color change. Negative for wound.  Neurological: Negative for dizziness, light-headedness and headaches.       Objective:   Vitals:   12/20/18 0906  BP: 136/72  Pulse: 62  Resp: 16  Temp: 98.4 F (36.9 C)  SpO2: 97%   BP Readings from Last 3 Encounters:  12/20/18 136/72  07/05/18 126/74  02/11/18 106/62   Wt Readings from Last 3 Encounters:  12/20/18 203 lb 12.8 oz (92.4 kg)  07/05/18 198 lb (89.8 kg)  02/11/18 201 lb (91.2 kg)   Body mass index is 37.28 kg/m.   Physical Exam    GENERAL APPEARANCE: Appears stated age, well appearing, NAD EYES: conjunctiva clear, no icterus, left eye lid swelling, but eye is open, EOMi b/l HEENT: swelling and erythema left side of face involving left uppper and lower eye lid, left side of nose and cheek with mild swelling - rough  area in central region of swelling/erythema - ? scratch or bite mark, no open wound, bilateral tympanic membranes and ear canals normal, oropharynx with no erythema or swelling, no thyromegaly, trachea midline, no cervical lymphadenopathy LUNGS: Clear to auscultation without wheeze or crackles, unlabored breathing, good air entry bilaterally CARDIOVASCULAR: Normal S1,S2 without murmurs,  no edema SKIN: Warm, dry      Assessment & Plan:    See Problem List for Assessment and Plan of chronic medical problems.

## 2018-12-21 ENCOUNTER — Ambulatory Visit: Payer: Self-pay | Admitting: Internal Medicine

## 2018-12-21 MED ORDER — DOXYCYCLINE HYCLATE 100 MG PO TABS: 100.0000 mg | ORAL_TABLET | Freq: Two times a day (BID) | ORAL | 0 refills | Status: DC

## 2018-12-21 NOTE — Telephone Encounter (Signed)
Doxycycline sent to [facility] 

## 2018-12-21 NOTE — Addendum Note (Signed)
Addended by: Binnie Rail on: 12/21/2018 10:09 AM   Modules accepted: Orders

## 2018-12-21 NOTE — Telephone Encounter (Signed)
Pt aware.

## 2018-12-21 NOTE — Telephone Encounter (Signed)
Pt. Reports she took one of her Augmentin last night, and "vomited all night.I can't take that medicine." Thinks the vomiting has stopped. Pt. States she feels she needs a different antibiotic. Please advise pt.  Answer Assessment - Initial Assessment Questions 1. SYMPTOMS: "Do you have any symptoms?"     Vomited last night 2. SEVERITY: If symptoms are present, ask "Are they mild, moderate or severe?"     Severe - seems to have stopped  Protocols used: MEDICATION QUESTION CALL-A-AH

## 2019-01-03 NOTE — Progress Notes (Signed)
Subjective:    Patient ID: Paige [name], female    DOB: 01-29-1951, 68 y.o.   MRN: [id number]  HPI The patient is here for follow up.  She is not exercising regularly.     Hypertension: She is taking her medication daily. She is compliant with a low sodium diet.  She denies chest pain, palpitations, edema, shortness of breath and regular headaches. She does not monitor her blood pressure at home.    Hypothyroidism:  She is taking her medication daily.  She denies any recent changes in energy or weight that are unexplained.   Hyperglycemia:  She is not compliant with a low sugar/carbohydrate diet.  She is not exercising regularly.  She has gained some weight and thinks that is because she is eating more.  Hyperlipidemia: She is taking her medication daily. She is compliant with a low fat/cholesterol diet. She denies myalgias.   Anxiety, depression, insomnia:  She follows with Dr Casimiro Needle  Facial cellulitis: She was seen 5/4 for facial cellulitis.  She did complete the antibiotics a few days ago.  There has been improvement in her symptoms-the swelling and redness have decreased, but she still has some remaining mild redness and possible swelling.  She also has intermittent, transient pain on the left side of her head that radiates down towards her left sinus region.  She has not had any fevers or chills.  Medications and allergies reviewed with patient and updated if appropriate.  Patient Active Problem List   Diagnosis Date Noted  . Facial cellulitis 12/20/2018  . Prediabetes 07/05/2018  . Popping sound of knee joint 07/05/2018  . Chronic bilateral low back pain without sciatica 07/30/2017  . Hyperlipidemia 07/04/2017  . Piriformis syndrome of left side 04/29/2016  . Left hip pain 03/27/2016  . Lumbar radiculopathy 03/19/2016  . Neck pain on right side 01/16/2016  . Depression 06/09/2014  . AK (actinic keratosis) 10/19/2012  . CARCINOMA, BASAL CELL, BACK 11/16/2009  .  ANXIETY DISORDER, GENERALIZED 07/30/2009  . BACK PAIN WITH RADICULOPATHY 05/28/2009  . Unspecified thrombosed hemorrhoids 10/17/2008  . CONSTIPATION, INTERMITTENT 12/20/2007  . OSTEOARTHRITIS 12/20/2007  . Hypothyroidism 06/08/2007  . Attention deficit hyperactivity disorder (ADHD) 06/08/2007  . Carpal tunnel syndrome 06/08/2007  . Essential hypertension 06/08/2007    Current Outpatient Medications on File Prior to Visit  Medication Sig Dispense Refill  . B Complex-C (SUPER B COMPLEX PO) Take by mouth.    . bisoprolol-hydrochlorothiazide (ZIAC) 5-6.25 MG tablet TAKE 1 TABLET BY MOUTH ONCE DAILY 90 tablet 1  . Eszopiclone 3 MG TABS Take 1 tablet (3 mg total) by mouth at bedtime as needed. Take immediately before bedtime 30 tablet 5  . levothyroxine (SYNTHROID, LEVOTHROID) 112 MCG tablet TAKE 1 TABLET BY MOUTH ONCE DAILY 90 tablet 0  . venlafaxine XR (EFFEXOR-XR) 150 MG 24 hr capsule Take 1 capsule (150 mg total) by mouth daily. 30 capsule 7   No current facility-administered medications on file prior to visit.     Past Medical History:  Diagnosis Date  . ADD (attention deficit disorder)    Pt has hard time focusing  . Anxiety   . Arthritis   . Carpal tunnel syndrome   . Colon polyp   . Constipation   . Depression   . Gallstones   . GERD (gastroesophageal reflux disease)   . Hypertension   . Status post dilation of esophageal narrowing   . Thyroid disease     Past Surgical History:  Procedure  Laterality Date  . ABDOMINAL HYSTERECTOMY    . CARPAL TUNNEL RELEASE Left   . CHOLECYSTECTOMY    . CLOSED REDUCTION HAND FRACTURE     right hand  . fx leg    . NASAL SINUS SURGERY    . SHOULDER SURGERY     reattac tendon; right shoulder  . TIBIA FRACTURE SURGERY     steel rod right leg  . TUBAL LIGATION      Social History   Socioeconomic History  . Marital status: Widowed    Spouse name: Not on file  . Number of children: 2  . Years of education: Not on file  .  Highest education level: Not on file  Occupational History  . Occupation: retired    Fish farm manager: UNEMPLOYED  . Occupation: Conservation officer, historic buildings: [place]  . Financial resource strain: Not hard at all  . Food insecurity:    Worry: Never true    Inability: Never true  . Transportation needs:    Medical: No    Non-medical: No  Tobacco Use  . Smoking status: Never Smoker  . Smokeless tobacco: Never Used  Substance and Sexual Activity  . Alcohol use: No  . Drug use: No  . Sexual activity: Never  Lifestyle  . Physical activity:    Days per week: 0 days    Minutes per session: 0 min  . Stress: Only a little  Relationships  . Social connections:    Talks on phone: More than three times a week    Gets together: More than three times a week    Attends religious service: Not on file    Active member of club or organization: Not on file    Attends meetings of clubs or organizations: Not on file    Relationship status: Not on file  Other Topics Concern  . Not on file  Social History Narrative   Works at [employer] - husband died of lung cancer      No regular exercise    Family History  Problem Relation Age of Onset  . Hyperlipidemia Mother   . Colon cancer Mother        72  . Lung cancer Father   . Cancer Paternal Grandfather        mets  . Leukemia Maternal Aunt     Review of Systems  Constitutional: Negative for chills and fever.  HENT: Negative for congestion and sinus pain.   Eyes: Negative for pain and visual disturbance.  Respiratory: Negative for cough, shortness of breath and wheezing.   Cardiovascular: Negative for chest pain, palpitations and leg swelling.  Neurological: Positive for headaches (electric like pain down left side of head to face). Negative for dizziness and light-headedness.       Objective:   Vitals:   01/04/19 1533  BP: 138/84  Pulse: 69  Resp: 16  Temp: 98.4 F (36.9 C)   SpO2: 98%   BP Readings from Last 3 Encounters:  01/04/19 138/84  12/20/18 136/72  07/05/18 126/74   Wt Readings from Last 3 Encounters:  01/04/19 204 lb 6.4 oz (92.7 kg)  12/20/18 203 lb 12.8 oz (92.4 kg)  07/05/18 198 lb (89.8 kg)   Body mass index is 37.39 kg/m.   Physical Exam    Constitutional: Appears well-developed and well-nourished. No distress.  HENT:  Head: Normocephalic and atraumatic.  Mild erythema and swelling just left  of nose - no tenderness Neck: Neck supple. No tracheal deviation present. No thyromegaly present.  No cervical lymphadenopathy Cardiovascular: Normal rate, regular rhythm and normal heart sounds.   No murmur heard. No carotid bruit .  No edema Pulmonary/Chest: Effort normal and breath sounds normal. No respiratory distress. No has no wheezes. No rales.  Skin: Skin is warm and dry. Not diaphoretic.  Psychiatric: Normal mood and affect. Behavior is normal.      Assessment & Plan:    See Problem List for Assessment and Plan of chronic medical problems.

## 2019-01-04 ENCOUNTER — Encounter: Payer: Self-pay | Admitting: Internal Medicine

## 2019-01-04 ENCOUNTER — Other Ambulatory Visit: Payer: Self-pay

## 2019-01-04 ENCOUNTER — Ambulatory Visit (INDEPENDENT_AMBULATORY_CARE_PROVIDER_SITE_OTHER): Payer: PPO | Admitting: Internal Medicine

## 2019-01-04 ENCOUNTER — Other Ambulatory Visit (INDEPENDENT_AMBULATORY_CARE_PROVIDER_SITE_OTHER): Payer: PPO

## 2019-01-04 VITALS — BP 138/84 | HR 69 | Temp 98.4°F | Resp 16 | Ht 62.0 in | Wt 204.4 lb

## 2019-01-04 DIAGNOSIS — E782 Mixed hyperlipidemia: Secondary | ICD-10-CM

## 2019-01-04 DIAGNOSIS — F32A Depression, unspecified: Secondary | ICD-10-CM

## 2019-01-04 DIAGNOSIS — E039 Hypothyroidism, unspecified: Secondary | ICD-10-CM | POA: Diagnosis not present

## 2019-01-04 DIAGNOSIS — I10 Essential (primary) hypertension: Secondary | ICD-10-CM

## 2019-01-04 DIAGNOSIS — F411 Generalized anxiety disorder: Secondary | ICD-10-CM

## 2019-01-04 DIAGNOSIS — L03211 Cellulitis of face: Secondary | ICD-10-CM | POA: Diagnosis not present

## 2019-01-04 DIAGNOSIS — R7303 Prediabetes: Secondary | ICD-10-CM

## 2019-01-04 DIAGNOSIS — F329 Major depressive disorder, single episode, unspecified: Secondary | ICD-10-CM

## 2019-01-04 MED ORDER — DOXYCYCLINE HYCLATE 100 MG PO TABS: 100.0000 mg | ORAL_TABLET | Freq: Two times a day (BID) | ORAL | 0 refills | Status: DC

## 2019-01-04 NOTE — Assessment & Plan Note (Signed)
BP Readings from Last 3 Encounters:  01/04/19 138/84  12/20/18 136/72  07/05/18 126/74    BP well controlled Current regimen effective and well tolerated Continue current medications at current doses cmp

## 2019-01-04 NOTE — Patient Instructions (Addendum)
  Tests ordered today. Your results will be released to [place] (or called to you) after review, usually within 72hours after test completion. If any changes need to be made, you will be notified at that same time.   Medications reviewed and updated.  Changes include :  Doxycycline for one week   Your prescription(s) have been submitted to your pharmacy. Please take as directed and contact our office if you believe you are having problem(s) with the medication(s).   Please followup in 6 months

## 2019-01-04 NOTE — Assessment & Plan Note (Signed)
Not completely resolved Will start doxycyline 100 mg bid x 7 days Call if symptoms do not resolve completely

## 2019-01-04 NOTE — Assessment & Plan Note (Signed)
Management per psych

## 2019-01-04 NOTE — Assessment & Plan Note (Signed)
Check a1c Low sugar / carb diet Stressed regular exercise   

## 2019-01-04 NOTE — Assessment & Plan Note (Signed)
Not currently on any medication Will hold off on checking lipid panel since she is not fasting Encouraged regular exercise and weight loss Low-fat/cholesterol diet

## 2019-01-04 NOTE — Assessment & Plan Note (Signed)
Clinically euthyroid Check tsh  Titrate med dose if needed  

## 2019-01-05 LAB — COMPREHENSIVE METABOLIC PANEL
ALT: 9 U/L (ref 0–35)
AST: 21 U/L (ref 0–37)
Albumin: 4.4 g/dL (ref 3.5–5.2)
Alkaline Phosphatase: 83 U/L (ref 39–117)
BUN: 22 mg/dL (ref 6–23)
CO2: 28 mEq/L (ref 19–32)
Calcium: 9.8 mg/dL (ref 8.4–10.5)
Chloride: 102 mEq/L (ref 96–112)
Creatinine, Ser: 0.88 mg/dL (ref 0.40–1.20)
GFR: 63.74 mL/min (ref >60.00)
Glucose, Bld: 110 mg/dL — ABNORMAL HIGH (ref 70–99)
Potassium: 3.5 mEq/L (ref 3.5–5.1)
Sodium: 142 mEq/L (ref 135–145)
Total Bilirubin: 0.3 mg/dL (ref 0.2–1.2)
Total Protein: 7.6 g/dL (ref 6.0–8.3)

## 2019-01-05 LAB — HEMOGLOBIN A1C: Hgb A1c MFr Bld: 6.3 % (ref 4.6–6.5)

## 2019-01-05 LAB — TSH: TSH: 0.39 u[IU]/mL (ref 0.35–4.50)

## 2019-01-17 ENCOUNTER — Other Ambulatory Visit: Payer: Self-pay | Admitting: Internal Medicine

## 2019-01-26 DIAGNOSIS — L570 Actinic keratosis: Secondary | ICD-10-CM | POA: Diagnosis not present

## 2019-01-26 DIAGNOSIS — C44612 Basal cell carcinoma of skin of right upper limb, including shoulder: Secondary | ICD-10-CM | POA: Diagnosis not present

## 2019-01-26 DIAGNOSIS — Z85828 Personal history of other malignant neoplasm of skin: Secondary | ICD-10-CM | POA: Diagnosis not present

## 2019-01-26 DIAGNOSIS — L821 Other seborrheic keratosis: Secondary | ICD-10-CM | POA: Diagnosis not present

## 2019-01-26 DIAGNOSIS — L720 Epidermal cyst: Secondary | ICD-10-CM | POA: Diagnosis not present

## 2019-01-26 DIAGNOSIS — D485 Neoplasm of uncertain behavior of skin: Secondary | ICD-10-CM | POA: Diagnosis not present

## 2019-02-09 ENCOUNTER — Ambulatory Visit (INDEPENDENT_AMBULATORY_CARE_PROVIDER_SITE_OTHER): Payer: PPO | Admitting: Psychiatry

## 2019-02-09 ENCOUNTER — Other Ambulatory Visit: Payer: Self-pay

## 2019-02-09 DIAGNOSIS — F3342 Major depressive disorder, recurrent, in full remission: Secondary | ICD-10-CM | POA: Diagnosis not present

## 2019-02-09 MED ORDER — ESZOPICLONE 3 MG PO TABS: 3.0000 mg | ORAL_TABLET | Freq: Every evening | ORAL | 5 refills | Status: DC | PRN

## 2019-02-09 MED ORDER — VENLAFAXINE HCL ER 150 MG PO CP24: 150.0000 mg | ORAL_CAPSULE | Freq: Every day | ORAL | 7 refills | Status: DC

## 2019-02-09 NOTE — Progress Notes (Signed)
Patient ID: Paige [name], female   DOB: [birth year], 68 y.o.   MRN: [id number] Patient ID: Paige [name], female   DOB: [birth year], 68 y.o.   MRN: [id number] [facility] MD Progress Note  02/09/2019 2:45 PM Paige Mohair   Pa time.st Medical History:   MRN:  [id number] Diagnosis major depression in remission  Chief complaint depressed mood state   At this time the patient is doing well.  She is stable.  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  Patient is very happy with her work.  Her bankruptcy issue is done.  She finally is got a headache.  She is eating getting paid overtime for her work.  Patient is sleeping and eating well.  She is got good energy.  She has no problems thinking concentrating.  She has a good sense of work.  She denies chest pain shortness of breath or any neurological symptoms.  She shows no signs of a viral infection.  She takes her medicines just as prescribed.  She denies any significant fatigue.  She is functioning very well.  She is isolated and she is very independent but she is actually doing quite well.  She does start going back to being able to go to stores because of the virus. Past Medical History:  Diagnosis Date  . ADD (attention deficit disorder)    Pt has hard time focusing  . Anxiety   . Arthritis   . Carpal tunnel syndrome   . Colon polyp   . Constipation   . Depression   . Gallstones   . GERD (gastroesophageal reflux disease)   . Hypertension   . Status post dilation of esophageal narrowing   . Thyroid disease     Past Surgical History:  Procedure Laterality Date  . ABDOMINAL HYSTERECTOMY    . CARPAL TUNNEL RELEASE Left   . CHOLECYSTECTOMY    . CLOSED REDUCTION HAND FRACTURE     right hand  . fx leg    . NASAL SINUS SURGERY    . SHOULDER SURGERY     reattac tendon; right shoulder  . TIBIA FRACTURE SURGERY     steel rod right leg   . TUBAL LIGATION     Family History:  Family History  Problem Relation Age of Onset  . Hyperlipidemia Mother   . Colon cancer Mother        17  . Lung cancer Father   . Cancer Paternal Grandfather        mets  . Leukemia Maternal Aunt    Family Psychiatric  History:  Social History:  Social History   Substance and Sexual Activity  Alcohol Use No     Social History   Substance and Sexual Activity  Drug Use No    Social History   Socioeconomic History  . Marital status: Widowed    Spouse name: Not on file  . Number of children: 2  . Years of education: Not on file  . Highest education level: Not on file  Occupational History  . Occupation: retired    Fish farm manager: UNEMPLOYED  . 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 Financial resource strain: Not hard at all  . Food insecurity    Worry: Never true    Inability: Never true  . Transportation needs    Medical: No  Non-medical: No  Tobacco Use  . Smoking status: Never Smoker  . Smokeless tobacco: Never Used  Substance and Sexual Activity  . Alcohol use: No  . Drug use: No  . Sexual activity: Never  Lifestyle  . Physical activity    Days per week: 0 days    Minutes per session: 0 min  . Stress: Only a little  Relationships  . Social connections    Talks on phone: More than three times a week    Gets together: More than three times a week    Attends religious service: Not on file    Active member of club or organization: Not on file    Attends meetings of clubs or organizations: Not on file    Relationship status: Not on file  Other Topics Concern  . Not on file  Social History Narrative   Works at [employer] - husband died of lung cancer      No regular exercise   Additional Social History:                         Sleep: Fair  Appetite:  Good  Current Medications: Current Outpatient Medications  Medication Sig Dispense  Refill  . B Complex-C (SUPER B COMPLEX PO) Take by mouth.    . bisoprolol-hydrochlorothiazide (ZIAC) 5-6.25 MG tablet Take 1 tablet by mouth once daily 90 tablet 1  . doxycycline (VIBRA-TABS) 100 MG tablet Take 1 tablet (100 mg total) by mouth 2 (two) times daily. 14 tablet 0  . Eszopiclone 3 MG TABS Take 1 tablet (3 mg total) by mouth at bedtime as needed. Take immediately before bedtime 30 tablet 5  . levothyroxine (SYNTHROID) 112 MCG tablet Take 1 tablet by mouth once daily 90 tablet 1  . venlafaxine XR (EFFEXOR-XR) 150 MG 24 hr capsule Take 1 capsule (150 mg total) by mouth daily. 30 capsule 7   No current facility-administered medications for this visit.     Lab Results: No results found for this or any previous visit (from the past 48 hour(s)).  Blood Alcohol level:  No results found for: [facility]  Physical Findings: AIMS:  , ,  ,  ,    CIWA:    COWS:     Musculoskeletal: Strength & Muscle Tone: within normal limits Gait & Station: normal Patient leans: N/A  Psychiatric Specialty Exam: ROS  There were no vitals taken for this visit.There is no height or weight on file to calculate BMI.  General Appearance: Fairly Groomed  Engineer, water::  Good  Speech:  Clear and Coherent  Volume:  Normal  Mood:  Euthymic  Affect:  Blunt  Thought Process:  Coherent  Orientation:  Full (Time, Place, and Person)  Thought Content:  WDL  Suicidal Thoughts:  No  Homicidal Thoughts:  No  Memory:  NA  Judgement:  NA  Insight:  Good  Psychomotor Activity:  Decreased  Concentration:  Fair  Recall:  [employer] of Knowledge:Good  Language: Fair  Akathisia:  No  Handed:  Right  AIMS (if indicated):     Assets:  Desire for Improvement  ADL's:  Intact  Cognition: WNL  Sleep:      Treatment Plan Summary: 02/09/2019, 2:45 PM  At this time the patient #1 problem is that of clinical major depression.  He takes Effexor as prescribed.  She also takes [place] for sleep.  Patient is doing very  well.  She is stable.  She will return to see me in 5 months.  She denies any physical complaints.

## 2019-02-10 ENCOUNTER — Encounter (HOSPITAL_COMMUNITY): Payer: Self-pay | Admitting: Emergency Medicine

## 2019-02-10 ENCOUNTER — Emergency Department (HOSPITAL_COMMUNITY): Payer: Worker's Compensation

## 2019-02-10 ENCOUNTER — Emergency Department (HOSPITAL_COMMUNITY)
Admission: EM | Admit: 2019-02-10 | Discharge: 2019-02-10 | Disposition: A | Discharge status: Home / Self Care | Payer: Worker's Compensation | Attending: Emergency Medicine | Admitting: Emergency Medicine

## 2019-02-10 ENCOUNTER — Other Ambulatory Visit: Payer: Self-pay

## 2019-02-10 DIAGNOSIS — M542 Cervicalgia: Secondary | ICD-10-CM | POA: Insufficient documentation

## 2019-02-10 DIAGNOSIS — E039 Hypothyroidism, unspecified: Secondary | ICD-10-CM | POA: Insufficient documentation

## 2019-02-10 DIAGNOSIS — S022XXB Fracture of nasal bones, initial encounter for open fracture: Secondary | ICD-10-CM | POA: Diagnosis not present

## 2019-02-10 DIAGNOSIS — Z23 Encounter for immunization: Secondary | ICD-10-CM | POA: Diagnosis not present

## 2019-02-10 DIAGNOSIS — R58 Hemorrhage, not elsewhere classified: Secondary | ICD-10-CM | POA: Diagnosis not present

## 2019-02-10 DIAGNOSIS — Y929 Unspecified place or not applicable: Secondary | ICD-10-CM | POA: Insufficient documentation

## 2019-02-10 DIAGNOSIS — R609 Edema, unspecified: Secondary | ICD-10-CM | POA: Diagnosis not present

## 2019-02-10 DIAGNOSIS — Y998 Other external cause status: Secondary | ICD-10-CM | POA: Diagnosis not present

## 2019-02-10 DIAGNOSIS — I10 Essential (primary) hypertension: Secondary | ICD-10-CM | POA: Diagnosis not present

## 2019-02-10 DIAGNOSIS — Y9389 Activity, other specified: Secondary | ICD-10-CM | POA: Insufficient documentation

## 2019-02-10 DIAGNOSIS — Z9049 Acquired absence of other specified parts of digestive tract: Secondary | ICD-10-CM | POA: Diagnosis not present

## 2019-02-10 DIAGNOSIS — R0902 Hypoxemia: Secondary | ICD-10-CM | POA: Diagnosis not present

## 2019-02-10 DIAGNOSIS — W19XXXA Unspecified fall, initial encounter: Secondary | ICD-10-CM | POA: Diagnosis not present

## 2019-02-10 DIAGNOSIS — R079 Chest pain, unspecified: Secondary | ICD-10-CM | POA: Diagnosis not present

## 2019-02-10 DIAGNOSIS — Z79899 Other long term (current) drug therapy: Secondary | ICD-10-CM | POA: Insufficient documentation

## 2019-02-10 DIAGNOSIS — W01198A Fall on same level from slipping, tripping and stumbling with subsequent striking against other object, initial encounter: Secondary | ICD-10-CM | POA: Insufficient documentation

## 2019-02-10 DIAGNOSIS — S022XXA Fracture of nasal bones, initial encounter for closed fracture: Secondary | ICD-10-CM | POA: Insufficient documentation

## 2019-02-10 DIAGNOSIS — S0993XA Unspecified injury of face, initial encounter: Secondary | ICD-10-CM | POA: Diagnosis present

## 2019-02-10 DIAGNOSIS — R52 Pain, unspecified: Secondary | ICD-10-CM | POA: Diagnosis not present

## 2019-02-10 MED ORDER — TETANUS-DIPHTH-ACELL PERTUSSIS 5-2.5-18.5 LF-MCG/0.5 IM SUSP
0.5000 mL | Freq: Once | INTRAMUSCULAR | Status: AC
  Administered 2019-02-10: 0.5 mL via INTRAMUSCULAR
  Filled 2019-02-10: qty 0.5

## 2019-02-10 MED ORDER — DOXYCYCLINE HYCLATE 100 MG PO CAPS: 100.0000 mg | ORAL_CAPSULE | Freq: Two times a day (BID) | ORAL | 0 refills | Status: DC

## 2019-02-10 MED ORDER — DOXYCYCLINE HYCLATE 100 MG PO TABS
100.0000 mg | ORAL_TABLET | Freq: Once | ORAL | Status: AC
  Administered 2019-02-10: 17:00:00 100 mg via ORAL
  Filled 2019-02-10: qty 1

## 2019-02-10 NOTE — ED Notes (Signed)
Patient transported to X-ray 

## 2019-02-10 NOTE — Discharge Instructions (Addendum)
Please read attached information. If you experience any new or worsening signs or symptoms please return to the emergency room for evaluation. Please follow-up with your primary care provider or specialist as discussed. Please use medication prescribed only as directed and discontinue taking if you have any concerning signs or symptoms.   °

## 2019-02-10 NOTE — ED Provider Notes (Signed)
[facility] EMERGENCY DEPARTMENT Provider Note   CSN: [id number] Arrival date & time: 02/10/19  1232    History   Chief Complaint Chief Complaint  Patient presents with   Fall   Facial Injury   Shoulder Pain    HPI Paige [name] is a 68 y.o. female.     HPI   68 year old female presents status post fall.  Patient notes she had a mechanical fall stumbling forward striking her nose on the ground.  She notes no loss of consciousness no neurological deficits.  She notes bleeding to the bridge of her nose with associated pain and swelling.  She has blood coming out both of her nostrils.  She notes some pain in her right shoulder, pain on the right anterior chest wall and some injuries to the right upper extremity.  She notes she is not on anticoagulation or antiplatelet therapy.  She denies shortness of breath.  Past Medical History:  Diagnosis Date   ADD (attention deficit disorder)    Pt has hard time focusing   Anxiety    Arthritis    Carpal tunnel syndrome    Colon polyp    Constipation    Depression    Gallstones    GERD (gastroesophageal reflux disease)    Hypertension    Status post dilation of esophageal narrowing    Thyroid disease     Patient Active Problem List   Diagnosis Date Noted   Facial cellulitis 12/20/2018   Prediabetes 07/05/2018   Popping sound of knee joint 07/05/2018   Chronic bilateral low back pain without sciatica 07/30/2017   Hyperlipidemia 07/04/2017   Piriformis syndrome of left side 04/29/2016   Left hip pain 03/27/2016   Lumbar radiculopathy 03/19/2016   Neck pain on right side 01/16/2016   Depression 06/09/2014   AK (actinic keratosis) 10/19/2012   CARCINOMA, BASAL CELL, BACK 11/16/2009   ANXIETY DISORDER, GENERALIZED 07/30/2009   BACK PAIN WITH RADICULOPATHY 05/28/2009   Unspecified thrombosed hemorrhoids 10/17/2008   CONSTIPATION, INTERMITTENT 12/20/2007   OSTEOARTHRITIS  12/20/2007   Hypothyroidism 06/08/2007   Attention deficit hyperactivity disorder (ADHD) 06/08/2007   Carpal tunnel syndrome 06/08/2007   Essential hypertension 06/08/2007    Past Surgical History:  Procedure Laterality Date   ABDOMINAL HYSTERECTOMY     CARPAL TUNNEL RELEASE Left    CHOLECYSTECTOMY     CLOSED REDUCTION HAND FRACTURE     right hand   fx leg     NASAL SINUS SURGERY     SHOULDER SURGERY     reattac tendon; right shoulder   TIBIA FRACTURE SURGERY     steel rod right leg   TUBAL LIGATION       OB History    Gravida  3   Para      Term      Preterm      AB  1   Living  2     SAB  1   TAB      Ectopic      Multiple      Live Births               Home Medications    Prior to Admission medications   Medication Sig Start Date End Date Taking? Authorizing Provider  B Complex-C (SUPER B COMPLEX PO) Take by mouth.    [provider]  bisoprolol-hydrochlorothiazide [facility]) 5-6.25 MG tablet Take 1 tablet by mouth once daily 01/17/19   Binnie Rail, MD  doxycycline (VIBRAMYCIN) 100 MG capsule Take 1 capsule (100 mg total) by mouth 2 (two) times daily. 02/10/19   Catcher Dehoyos, Dellis Filbert, PA-C  Eszopiclone 3 MG TABS Take 1 tablet (3 mg total) by mouth at bedtime as needed. Take immediately before bedtime 02/09/19   Norma Fredrickson, MD  levothyroxine (SYNTHROID) 112 MCG tablet Take 1 tablet by mouth once daily 01/17/19   Binnie Rail, MD  venlafaxine XR (EFFEXOR-XR) 150 MG 24 hr capsule Take 1 capsule (150 mg total) by mouth daily. 02/09/19 02/09/20  Norma Fredrickson, MD    Family History Family History  Problem Relation Age of Onset   Hyperlipidemia Mother    Colon cancer Mother        73   Lung cancer Father    Cancer Paternal Grandfather        mets   Leukemia Maternal Aunt     Social History Social History   Tobacco Use   Smoking status: Never Smoker   Smokeless tobacco: Never Used  Substance Use Topics   Alcohol  use: No   Drug use: No     Allergies   Asa [aspirin], Augmentin [amoxicillin-pot clavulanate], and Morphine and related   Review of Systems Review of Systems  All other systems reviewed and are negative.  Physical Exam Updated Vital Signs BP 125/64 (BP Location: Right Arm)    Pulse 75    Temp 97.9 F (36.6 C) (Oral)    Resp 16    Ht 5\' 2"  (1.575 m)    Wt 91 kg    SpO2 99%    BMI 36.69 kg/m   Physical Exam Vitals signs and nursing note reviewed.  Constitutional:      Appearance: She is well-developed.  HENT:     Head: Normocephalic and atraumatic.     Comments: 0.5 cm laceration to the bridge of the nose with surrounding swelling, tenderness to the nasal bridge, minor tenderness to palpation to the right inferior periocular soft tissue-no obvious deformities of the face, no septal hematoma noted bilateral nares patent bilateral-no active full active range of motion Eyes:     General: No scleral icterus.       Right eye: No discharge.        Left eye: No discharge.     Conjunctiva/sclera: Conjunctivae normal.     Pupils: Pupils are equal, round, and reactive to light.  Neck:     Musculoskeletal: Normal range of motion.     Vascular: No JVD.     Trachea: No tracheal deviation.  Pulmonary:     Effort: Pulmonary effort is normal.     Breath sounds: No stridor.     Comments: Right anterior chest with minimal tenderness to palpation, no crepitus, no obvious deformities or bruising, lungs clear throughout Musculoskeletal:     Comments: No cervical spine tenderness palpation-some right shoulder pain with palpation of the mid lower cervical region  Neurological:     Mental Status: She is alert and oriented to person, place, and time.     Coordination: Coordination normal.  Psychiatric:        Behavior: Behavior normal.        Thought Content: Thought content normal.        Judgment: Judgment normal.     ED Treatments / Results  Labs (all labs ordered are listed, but only  abnormal results are displayed) Labs Reviewed - No data to display  EKG None  Radiology Dg Chest 2 View  Result Date: 02/10/2019  CLINICAL DATA:  68 year old female with chest pain EXAM: CHEST - 2 VIEW COMPARISON:  None. FINDINGS: Cardiomediastinal silhouette within normal limits in size and contour. No evidence of central vascular congestion or interlobular septal thickening. No pneumothorax. No pleural effusion. No confluent airspace disease. No acute displaced fracture. Degenerative changes of the spine. Posttraumatic deformity of right chest wall. IMPRESSION: Low lung volumes with likely atelectasis and chronic changes with no evidence of acute cardiopulmonary disease. Electronically Signed   By: Corrie Mckusick D.O.   On: 02/10/2019 16:53   Ct Head Wo Contrast  Result Date: 02/10/2019 CLINICAL DATA:  Tripped on curb, facial laceration EXAM: CT HEAD WITHOUT CONTRAST CT MAXILLOFACIAL WITHOUT CONTRAST CT CERVICAL SPINE WITHOUT CONTRAST TECHNIQUE: Multidetector CT imaging of the head, cervical spine, and maxillofacial structures were performed using the standard protocol without intravenous contrast. Multiplanar CT image reconstructions of the cervical spine and maxillofacial structures were also generated. COMPARISON:  None. FINDINGS: CT HEAD FINDINGS Brain: No evidence of acute infarction, hemorrhage, hydrocephalus, extra-axial collection or mass lesion/mass effect. Vascular: No hyperdense vessel or unexpected calcification. CT FACIAL BONES FINDINGS Skull: Normal. Negative for fracture or focal lesion. Facial bones: Mildly displaced fractures of the bilateral nasal bones. No other displaced fractures or dislocations. Sinuses/Orbits: No acute finding. Other: None. CT CERVICAL SPINE FINDINGS Alignment: Straightening of the normal cervical lordosis. Skull base and vertebrae: No acute fracture. No primary bone lesion or focal pathologic process. Soft tissues and spinal canal: No prevertebral fluid or  swelling. No visible canal hematoma. Disc levels: Mild multilevel disc space height loss and osteophytosis. Upper chest: Negative. Other: None. IMPRESSION: 1.  No acute intracranial pathology. 2. Mildly displaced fractures of the bilateral nasal bones. No other displaced fractures or dislocations of the facial bones. 3.  No fracture or static subluxation of the cervical spine. Electronically Signed   By: Eddie Candle M.D.   On: 02/10/2019 14:55   Ct Cervical Spine Wo Contrast  Result Date: 02/10/2019 CLINICAL DATA:  Tripped on curb, facial laceration EXAM: CT HEAD WITHOUT CONTRAST CT MAXILLOFACIAL WITHOUT CONTRAST CT CERVICAL SPINE WITHOUT CONTRAST TECHNIQUE: Multidetector CT imaging of the head, cervical spine, and maxillofacial structures were performed using the standard protocol without intravenous contrast. Multiplanar CT image reconstructions of the cervical spine and maxillofacial structures were also generated. COMPARISON:  None. FINDINGS: CT HEAD FINDINGS Brain: No evidence of acute infarction, hemorrhage, hydrocephalus, extra-axial collection or mass lesion/mass effect. Vascular: No hyperdense vessel or unexpected calcification. CT FACIAL BONES FINDINGS Skull: Normal. Negative for fracture or focal lesion. Facial bones: Mildly displaced fractures of the bilateral nasal bones. No other displaced fractures or dislocations. Sinuses/Orbits: No acute finding. Other: None. CT CERVICAL SPINE FINDINGS Alignment: Straightening of the normal cervical lordosis. Skull base and vertebrae: No acute fracture. No primary bone lesion or focal pathologic process. Soft tissues and spinal canal: No prevertebral fluid or swelling. No visible canal hematoma. Disc levels: Mild multilevel disc space height loss and osteophytosis. Upper chest: Negative. Other: None. IMPRESSION: 1.  No acute intracranial pathology. 2. Mildly displaced fractures of the bilateral nasal bones. No other displaced fractures or dislocations of the  facial bones. 3.  No fracture or static subluxation of the cervical spine. Electronically Signed   By: Eddie Candle M.D.   On: 02/10/2019 14:55   Ct Maxillofacial Wo Contrast  Result Date: 02/10/2019 CLINICAL DATA:  Tripped on curb, facial laceration EXAM: CT HEAD WITHOUT CONTRAST CT MAXILLOFACIAL WITHOUT CONTRAST CT CERVICAL SPINE WITHOUT CONTRAST TECHNIQUE: Multidetector CT imaging  of the head, cervical spine, and maxillofacial structures were performed using the standard protocol without intravenous contrast. Multiplanar CT image reconstructions of the cervical spine and maxillofacial structures were also generated. COMPARISON:  None. FINDINGS: CT HEAD FINDINGS Brain: No evidence of acute infarction, hemorrhage, hydrocephalus, extra-axial collection or mass lesion/mass effect. Vascular: No hyperdense vessel or unexpected calcification. CT FACIAL BONES FINDINGS Skull: Normal. Negative for fracture or focal lesion. Facial bones: Mildly displaced fractures of the bilateral nasal bones. No other displaced fractures or dislocations. Sinuses/Orbits: No acute finding. Other: None. CT CERVICAL SPINE FINDINGS Alignment: Straightening of the normal cervical lordosis. Skull base and vertebrae: No acute fracture. No primary bone lesion or focal pathologic process. Soft tissues and spinal canal: No prevertebral fluid or swelling. No visible canal hematoma. Disc levels: Mild multilevel disc space height loss and osteophytosis. Upper chest: Negative. Other: None. IMPRESSION: 1.  No acute intracranial pathology. 2. Mildly displaced fractures of the bilateral nasal bones. No other displaced fractures or dislocations of the facial bones. 3.  No fracture or static subluxation of the cervical spine. Electronically Signed   By: Eddie Candle M.D.   On: 02/10/2019 14:55    Procedures Procedures (including critical care time)  Medications Ordered in ED Medications  Tdap (BOOSTRIX) injection 0.5 mL (0.5 mLs Intramuscular  Given 02/10/19 1420)  doxycycline (VIBRA-TABS) tablet 100 mg (100 mg Oral Given 02/10/19 1650)     Initial Impression / Assessment and Plan / ED Course  I have reviewed the triage vital signs and the nursing notes.  Pertinent labs & imaging results that were available during my care of the patient were reviewed by me and considered in my medical decision making (see chart for details).        Labs:   Imaging: Dg chest 2 view, CT head wo, CT maxillofacial without  Consults:  Therapeutics:  Discharge Meds:   Assessment/Plan: 68 year old female presents status post fall.  Patient with nasal fracture no septal hematoma.  She does have an overlying small laceration not amenable to repair.  I will cover her with antibiotics.  I discussed not blowing her nose, nasal saline rinses.  Case discussed with Dr. Melina Copa who agreed for disposition with oral antibiotics.  Patient will follow-up as an outpatient with ENT.  No other acute abnormalities noted.  Strict return precautions given, patient verbalized understanding and agreement to this plan had no further questions or concerns.   Final Clinical Impressions(s) / ED Diagnoses   Final diagnoses:  Fall, initial encounter  Open fracture of nasal bone, initial encounter    ED Discharge Orders         Ordered    doxycycline (VIBRAMYCIN) 100 MG capsule  2 times daily     02/10/19 1716           Okey Regal, PA-C 02/10/19 1910    Quintella Reichert, MD 02/11/19 [phone]

## 2019-02-10 NOTE — ED Notes (Signed)
Patient transported to CT 

## 2019-02-10 NOTE — ED Triage Notes (Signed)
Onset today tripped on a curb while at work. Denies LOC wearing glasses and has a laceration on nose with swelling. Airway intact bilateral equal chest rise and fall. Patient states also fell onto right side shoulder/right chest. Alert answering and following commands appropriate.

## 2019-02-10 NOTE — ED Notes (Signed)
Patient verbalizes understanding of discharge instructions. Opportunity for questioning and answers were provided. Armband removed by staff, pt discharged from ED.  

## 2019-02-13 DIAGNOSIS — S022XXA Fracture of nasal bones, initial encounter for closed fracture: Secondary | ICD-10-CM

## 2019-02-13 HISTORY — DX: Fracture of nasal bones, initial encounter for closed fracture: S02.2XXA

## 2019-02-13 NOTE — Patient Instructions (Addendum)
Start using saline nasal spray.  Avoid blowing your nose.    Apply Vaseline or antibiotic ointment to your nasal cut.    A referral was ordered for ENT.  They will call you to schedule an appointment.    Call with any concerns or questions.

## 2019-02-13 NOTE — Progress Notes (Signed)
Subjective:    Patient ID: Paige [name], female    DOB: 1951-06-20, 68 y.o.   MRN: [id number]  HPI The patient is here for follow up from the emergency room.  She went to the ED 6/25 after a fall.  She had a mechanical fall, stumbling forward and striking her nose on the ground.  She denied any loss of consciousness, numbness/tingling or weakness in her extremities.  She had pain, swelling and bleeding of the bridge of her nose.  She was actively bleeding in the emergency room from both nostrils.  She also noted right shoulder pain, pain in the right anterior chest wall and some injuries to the right upper extremity.  She denied any shortness of breath.  She had a 0.5 cm laceration to the bridge of her nose with surrounding swelling and tenderness. There was minor tenderness and swelling to right inferior periocular soft tissue without obvious deformities.  There is no septal hematoma.  Her right anterior chest had minimal tenderness to palpation.  There is no cervical spine tenderness.  She had right shoulder pain with palpation with palpation of the mid lower cervical region.  She had a chest x-ray, CT of the head and CT of the maxillofacial bones.  She was found to have a nasal fracture without septal hematoma.  She had a small laceration on her nasal bridge that was not amendable to repair.  She was advised to follow-up with outpatient ENT.  She was advised normal saline rinses and was started on doxycycline.  She was given a tetanus.   Nasal fracture:  She has mild nasal congestion, but can breathe through her nose.  She only has mld pain.  She has not been doing the nasal saline rinses, because she was not sure what to use.  She did get a scab out of her nose and there is no additional bleeding.  The nose is still very swollen.  Nasal laceration:  It is healing.  There is no bleeding or discharge.  She has minimal discomfort.    Chest wall pain:  She hit her chest when she fell and has  residual pain.  She has increased pain with movements and deep breaths.  Her pain is on the right side of the chest and that has improved.  She has not taken anything for the pain.  Right shoulder pain: Right shoulder pain has improved.  She does have some pain from the fall, but does have some chronic right shoulder issues as well.   Medications and allergies reviewed with patient and updated if appropriate.  Patient Active Problem List   Diagnosis Date Noted  . Chest wall pain 02/14/2019  . Nasal fracture 02/13/2019  . Facial cellulitis 12/20/2018  . Prediabetes 07/05/2018  . Popping sound of knee joint 07/05/2018  . Chronic bilateral low back pain without sciatica 07/30/2017  . Hyperlipidemia 07/04/2017  . Piriformis syndrome of left side 04/29/2016  . Left hip pain 03/27/2016  . Lumbar radiculopathy 03/19/2016  . Neck pain on right side 01/16/2016  . Depression 06/09/2014  . AK (actinic keratosis) 10/19/2012  . CARCINOMA, BASAL CELL, BACK 11/16/2009  . ANXIETY DISORDER, GENERALIZED 07/30/2009  . BACK PAIN WITH RADICULOPATHY 05/28/2009  . Unspecified thrombosed hemorrhoids 10/17/2008  . CONSTIPATION, INTERMITTENT 12/20/2007  . OSTEOARTHRITIS 12/20/2007  . Hypothyroidism 06/08/2007  . Attention deficit hyperactivity disorder (ADHD) 06/08/2007  . Carpal tunnel syndrome 06/08/2007  . Essential hypertension 06/08/2007    Current Outpatient Medications on  File Prior to Visit  Medication Sig Dispense Refill  . B Complex-C (SUPER B COMPLEX PO) Take by mouth.    . bisoprolol-hydrochlorothiazide (ZIAC) 5-6.25 MG tablet Take 1 tablet by mouth once daily 90 tablet 1  . doxycycline (VIBRAMYCIN) 100 MG capsule Take 1 capsule (100 mg total) by mouth 2 (two) times daily. 20 capsule 0  . Eszopiclone 3 MG TABS Take 1 tablet (3 mg total) by mouth at bedtime as needed. Take immediately before bedtime 30 tablet 5  . levothyroxine (SYNTHROID) 112 MCG tablet Take 1 tablet by mouth once daily 90  tablet 1  . venlafaxine XR (EFFEXOR-XR) 150 MG 24 hr capsule Take 1 capsule (150 mg total) by mouth daily. 30 capsule 7   No current facility-administered medications on file prior to visit.     Past Medical History:  Diagnosis Date  . ADD (attention deficit disorder)    Pt has hard time focusing  . Anxiety   . Arthritis   . Carpal tunnel syndrome   . Colon polyp   . Constipation   . Depression   . Gallstones   . GERD (gastroesophageal reflux disease)   . Hypertension   . Status post dilation of esophageal narrowing   . Thyroid disease     Past Surgical History:  Procedure Laterality Date  . ABDOMINAL HYSTERECTOMY    . CARPAL TUNNEL RELEASE Left   . CHOLECYSTECTOMY    . CLOSED REDUCTION HAND FRACTURE     right hand  . fx leg    . NASAL SINUS SURGERY    . SHOULDER SURGERY     reattac tendon; right shoulder  . TIBIA FRACTURE SURGERY     steel rod right leg  . TUBAL LIGATION      Social History   Socioeconomic History  . Marital status: Widowed    Spouse name: Not on file  . Number of children: 2  . Years of education: Not on file  . Highest education level: Not on file  Occupational History  . Occupation: retired    Fish farm manager: UNEMPLOYED  . Occupation: Conservation officer, historic buildings: [place]  . Financial resource strain: Not hard at all  . Food insecurity    Worry: Never true    Inability: Never true  . Transportation needs    Medical: No    Non-medical: No  Tobacco Use  . Smoking status: Never Smoker  . Smokeless tobacco: Never Used  Substance and Sexual Activity  . Alcohol use: No  . Drug use: No  . Sexual activity: Never  Lifestyle  . Physical activity    Days per week: 0 days    Minutes per session: 0 min  . Stress: Only a little  Relationships  . Social connections    Talks on phone: More than three times a week    Gets together: More than three times a week    Attends religious service: Not on file    Active member  of club or organization: Not on file    Attends meetings of clubs or organizations: Not on file    Relationship status: Not on file  Other Topics Concern  . Not on file  Social History Narrative   Works at [employer] - husband died of lung cancer      No regular exercise    Family History  Problem Relation Age of Onset  . Hyperlipidemia Mother   .  Colon cancer Mother        51  . Lung cancer Father   . Cancer Paternal Grandfather        mets  . Leukemia Maternal Aunt     Review of Systems  Constitutional: Negative for chills and fever.  HENT: Positive for congestion (mild). Negative for nosebleeds.   Eyes: Negative for visual disturbance.  Respiratory: Negative for cough, shortness of breath and wheezing.   Cardiovascular: Positive for chest pain (chest wall from fall).  Neurological: Positive for light-headedness (once) and headaches (mild).       Objective:   Vitals:   02/14/19 0838  BP: 124/70  Pulse: 69  Resp: 16  Temp: 98.4 F (36.9 C)  SpO2: 97%   BP Readings from Last 3 Encounters:  02/14/19 124/70  02/10/19 125/64  01/04/19 138/84   Wt Readings from Last 3 Encounters:  02/14/19 206 lb (93.4 kg)  02/10/19 200 lb 9.9 oz (91 kg)  01/04/19 204 lb 6.4 oz (92.7 kg)   Body mass index is 37.68 kg/m.   Physical Exam    Constitutional: Appears well-developed and well-nourished. No distress.  HENT:  Head: Normocephalic. Nasal bridge laceration - 0.5 cm - scabbed, no discharge or bleeding.  Nasal swelling and bruising.  Nasal passageways bilaterally without bleeding, swelling evident.  Ecchymosis under b/l eyes - no tenderness to periorbital regions Neck: Neck supple. No tracheal deviation present. No thyromegaly present.  No cervical lymphadenopathy Cardiovascular: Normal rate, regular rhythm and normal heart sounds.   No murmur heard. No carotid bruit .  No edema Pulmonary/Chest: tenderness right anterior chest pain - focal.   Effort normal and breath sounds normal. No respiratory distress. Musculoskeletal: Mild tenderness right shoulder, decreased range of motion of right shoulder, which is chronic and not changed no has no wheezes. No rales.     Lab Results  Component Value Date   WBC 8.9 07/05/2018   HGB 12.9 07/05/2018   HCT 38.4 07/05/2018   PLT 226.0 07/05/2018   GLUCOSE 110 (H) 01/04/2019   CHOL 239 (H) 07/05/2018   TRIG 192.0 (H) 07/05/2018   HDL 61.00 07/05/2018   LDLDIRECT 148.0 07/03/2017   LDLCALC 139 (H) 07/05/2018   ALT 9 01/04/2019   AST 21 01/04/2019   NA 142 01/04/2019   K 3.5 01/04/2019   CL 102 01/04/2019   CREATININE 0.88 01/04/2019   BUN 22 01/04/2019   CO2 28 01/04/2019   TSH 0.39 01/04/2019   HGBA1C 6.3 01/04/2019    DG Chest 2 View CLINICAL DATA:  68 year old female with chest pain  EXAM: CHEST - 2 VIEW  COMPARISON:  None.  FINDINGS: Cardiomediastinal silhouette within normal limits in size and contour. No evidence of central vascular congestion or interlobular septal thickening. No pneumothorax. No pleural effusion. No confluent airspace disease.  No acute displaced fracture. Degenerative changes of the spine. Posttraumatic deformity of right chest wall.  IMPRESSION: Low lung volumes with likely atelectasis and chronic changes with no evidence of acute cardiopulmonary disease.  Electronically Signed   By: Corrie Mckusick D.O.   On: 02/10/2019 16:53 CT Cervical Spine Wo Contrast CLINICAL DATA:  Tripped on curb, facial laceration  EXAM: CT HEAD WITHOUT CONTRAST  CT MAXILLOFACIAL WITHOUT CONTRAST  CT CERVICAL SPINE WITHOUT CONTRAST  TECHNIQUE: Multidetector CT imaging of the head, cervical spine, and maxillofacial structures were performed using the standard protocol without intravenous contrast. Multiplanar CT image reconstructions of the cervical spine and maxillofacial structures were also generated.  COMPARISON:  None.  FINDINGS: CT HEAD  FINDINGS  Brain: No evidence of acute infarction, hemorrhage, hydrocephalus, extra-axial collection or mass lesion/mass effect.  Vascular: No hyperdense vessel or unexpected calcification.  CT FACIAL BONES FINDINGS  Skull: Normal. Negative for fracture or focal lesion.  Facial bones: Mildly displaced fractures of the bilateral nasal bones. No other displaced fractures or dislocations.  Sinuses/Orbits: No acute finding.  Other: None.  CT CERVICAL SPINE FINDINGS  Alignment: Straightening of the normal cervical lordosis.  Skull base and vertebrae: No acute fracture. No primary bone lesion or focal pathologic process.  Soft tissues and spinal canal: No prevertebral fluid or swelling. No visible canal hematoma.  Disc levels: Mild multilevel disc space height loss and osteophytosis.  Upper chest: Negative.  Other: None.  IMPRESSION: 1.  No acute intracranial pathology.  2. Mildly displaced fractures of the bilateral nasal bones. No other displaced fractures or dislocations of the facial bones.  3.  No fracture or static subluxation of the cervical spine.  Electronically Signed   By: Eddie Candle M.D.   On: 02/10/2019 14:55 CT Maxillofacial Wo Contrast CLINICAL DATA:  Tripped on curb, facial laceration  EXAM: CT HEAD WITHOUT CONTRAST  CT MAXILLOFACIAL WITHOUT CONTRAST  CT CERVICAL SPINE WITHOUT CONTRAST  TECHNIQUE: Multidetector CT imaging of the head, cervical spine, and maxillofacial structures were performed using the standard protocol without intravenous contrast. Multiplanar CT image reconstructions of the cervical spine and maxillofacial structures were also generated.  COMPARISON:  None.  FINDINGS: CT HEAD FINDINGS  Brain: No evidence of acute infarction, hemorrhage, hydrocephalus, extra-axial collection or mass lesion/mass effect.  Vascular: No hyperdense vessel or unexpected calcification.  CT FACIAL BONES FINDINGS  Skull: Normal. Negative  for fracture or focal lesion.  Facial bones: Mildly displaced fractures of the bilateral nasal bones. No other displaced fractures or dislocations.  Sinuses/Orbits: No acute finding.  Other: None.  CT CERVICAL SPINE FINDINGS  Alignment: Straightening of the normal cervical lordosis.  Skull base and vertebrae: No acute fracture. No primary bone lesion or focal pathologic process.  Soft tissues and spinal canal: No prevertebral fluid or swelling. No visible canal hematoma.  Disc levels: Mild multilevel disc space height loss and osteophytosis.  Upper chest: Negative.  Other: None.  IMPRESSION: 1.  No acute intracranial pathology.  2. Mildly displaced fractures of the bilateral nasal bones. No other displaced fractures or dislocations of the facial bones.  3.  No fracture or static subluxation of the cervical spine.  Electronically Signed   By: Eddie Candle M.D.   On: 02/10/2019 14:55 CT Head Wo Contrast CLINICAL DATA:  Tripped on curb, facial laceration  EXAM: CT HEAD WITHOUT CONTRAST  CT MAXILLOFACIAL WITHOUT CONTRAST  CT CERVICAL SPINE WITHOUT CONTRAST  TECHNIQUE: Multidetector CT imaging of the head, cervical spine, and maxillofacial structures were performed using the standard protocol without intravenous contrast. Multiplanar CT image reconstructions of the cervical spine and maxillofacial structures were also generated.  COMPARISON:  None.  FINDINGS: CT HEAD FINDINGS  Brain: No evidence of acute infarction, hemorrhage, hydrocephalus, extra-axial collection or mass lesion/mass effect.  Vascular: No hyperdense vessel or unexpected calcification.  CT FACIAL BONES FINDINGS  Skull: Normal. Negative for fracture or focal lesion.  Facial bones: Mildly displaced fractures of the bilateral nasal bones. No other displaced fractures or dislocations.  Sinuses/Orbits: No acute finding.  Other: None.  CT CERVICAL SPINE FINDINGS  Alignment:  Straightening of the normal cervical lordosis.  Skull base and vertebrae: No acute fracture.  No primary bone lesion or focal pathologic process.  Soft tissues and spinal canal: No prevertebral fluid or swelling. No visible canal hematoma.  Disc levels: Mild multilevel disc space height loss and osteophytosis.  Upper chest: Negative.  Other: None.  IMPRESSION: 1.  No acute intracranial pathology.  2. Mildly displaced fractures of the bilateral nasal bones. No other displaced fractures or dislocations of the facial bones.  3.  No fracture or static subluxation of the cervical spine.  Electronically Signed   By: Eddie Candle M.D.   On: 02/10/2019 14:55    Assessment & Plan:    See Problem List for Assessment and Plan of chronic medical problems.

## 2019-02-14 ENCOUNTER — Encounter: Payer: Self-pay | Admitting: Internal Medicine

## 2019-02-14 ENCOUNTER — Ambulatory Visit (INDEPENDENT_AMBULATORY_CARE_PROVIDER_SITE_OTHER): Payer: Worker's Compensation | Admitting: Internal Medicine

## 2019-02-14 ENCOUNTER — Other Ambulatory Visit: Payer: Self-pay

## 2019-02-14 DIAGNOSIS — S022XXA Fracture of nasal bones, initial encounter for closed fracture: Secondary | ICD-10-CM | POA: Diagnosis not present

## 2019-02-14 DIAGNOSIS — S0121XA Laceration without foreign body of nose, initial encounter: Secondary | ICD-10-CM | POA: Diagnosis not present

## 2019-02-14 DIAGNOSIS — M25519 Pain in unspecified shoulder: Secondary | ICD-10-CM | POA: Insufficient documentation

## 2019-02-14 DIAGNOSIS — M25511 Pain in right shoulder: Secondary | ICD-10-CM

## 2019-02-14 DIAGNOSIS — R0789 Other chest pain: Secondary | ICD-10-CM | POA: Insufficient documentation

## 2019-02-14 HISTORY — DX: Pain in right shoulder: M25.511

## 2019-02-14 NOTE — Assessment & Plan Note (Signed)
Acute on chronic pain She does have chronic shoulder pain and decreased range of motion, but pain slightly increased after fall last week Pain has improved Can take Tylenol if needed

## 2019-02-14 NOTE — Assessment & Plan Note (Addendum)
Right anterior chest from fall No rib fracture Pain has improved, Symptomatic treatment

## 2019-02-14 NOTE — Assessment & Plan Note (Signed)
Saline nasal sprays-discussed when to get over-the-counter Continue and finish doxycycline Referred to ENT Tylenol if needed for pain

## 2019-02-14 NOTE — Assessment & Plan Note (Signed)
0.5 cm laceration to nasal bridge Was not amendable to repair Healing-with scab present, no active discharge or bleeding Advised to apply antibacterial ointment or Vaseline

## 2019-02-21 DIAGNOSIS — J342 Deviated nasal septum: Secondary | ICD-10-CM

## 2019-02-21 HISTORY — DX: Deviated nasal septum: J34.2

## 2019-05-16 ENCOUNTER — Other Ambulatory Visit: Payer: Self-pay

## 2019-05-16 ENCOUNTER — Ambulatory Visit (INDEPENDENT_AMBULATORY_CARE_PROVIDER_SITE_OTHER): Payer: PPO

## 2019-05-16 DIAGNOSIS — Z23 Encounter for immunization: Secondary | ICD-10-CM | POA: Diagnosis not present

## 2019-06-08 ENCOUNTER — Other Ambulatory Visit: Payer: Self-pay

## 2019-06-08 ENCOUNTER — Ambulatory Visit (INDEPENDENT_AMBULATORY_CARE_PROVIDER_SITE_OTHER): Payer: PPO | Admitting: Psychiatry

## 2019-06-08 DIAGNOSIS — G4700 Insomnia, unspecified: Secondary | ICD-10-CM

## 2019-06-08 DIAGNOSIS — F325 Major depressive disorder, single episode, in full remission: Secondary | ICD-10-CM | POA: Diagnosis not present

## 2019-06-08 MED ORDER — VENLAFAXINE HCL ER 150 MG PO CP24: 150.0000 mg | ORAL_CAPSULE | Freq: Every day | ORAL | 7 refills | Status: DC

## 2019-06-08 MED ORDER — ESZOPICLONE 3 MG PO TABS: 3.0000 mg | ORAL_TABLET | Freq: Every evening | ORAL | 5 refills | Status: DC | PRN

## 2019-06-08 NOTE — Progress Notes (Signed)
Patient ID: RIVER VIOLET, female   DOB: Oct 16, 1950, 68 y.o.   MRN: [id number] Patient ID: PROVIDENCE HAGGERTY, female   DOB: [birth year], 68 y.o.   MRN: [id number] [facility] MD Progress Note  06/08/2019 4:12 PM [name]   Pa time.st Medical History:   MRN:  [id number] Diagnosis major depression in remission  Chief complaint depressed mood state  Today the patient is doing very well.  She continues to work.  She denies daily depression.  She is enjoying life.  Financially she is much more stable.  She continues to work for the [employer] system.  She likes her work.  She likes working overtime.  She is sleeping and eating very well.  She is got good energy.  She does not like the pandemic scare her.  She has no evidence of psychosis.  She denies use of alcohol or drugs.  She takes her medicines just as prescribed.  She denies any chest pain or shortness of breath or any respiratory symptoms at all.  She is no fever.  She has no evidence of any acute medical condition at this time.  Her children are doing well.  Noted is that after I spoke to her last she actually had a fall when she was at work.  She actually cut the front of her face.  She is completely recovered from this event. Past Medical History:  Diagnosis Date  . ADD (attention deficit disorder)    Pt has hard time focusing  . Anxiety   . Arthritis   . Carpal tunnel syndrome   . Colon polyp   . Constipation   . Depression   . Gallstones   . GERD (gastroesophageal reflux disease)   . Hypertension   . Status post dilation of esophageal narrowing   . Thyroid disease     Past Surgical History:  Procedure Laterality Date  . ABDOMINAL HYSTERECTOMY    . CARPAL TUNNEL RELEASE Left   . CHOLECYSTECTOMY    . CLOSED REDUCTION HAND FRACTURE     right hand  . fx leg    . NASAL SINUS SURGERY    . SHOULDER SURGERY     reattac tendon; right shoulder  . TIBIA FRACTURE SURGERY     steel rod right leg  .  TUBAL LIGATION     Family History:  Family History  Problem Relation Age of Onset  . Hyperlipidemia Mother   . Colon cancer Mother        1  . Lung cancer Father   . Cancer Paternal Grandfather        mets  . Leukemia Maternal Aunt    Family Psychiatric  History:  Social History:  Social History   Substance and Sexual Activity  Alcohol Use No     Social History   Substance and Sexual Activity  Drug Use No    Social History   Socioeconomic History  . Marital status: Widowed    Spouse name: Not on file  . Number of children: 2  . Years of education: Not on file  . Highest education level: Not on file  Occupational History  . Occupation: retired    Fish farm manager: UNEMPLOYED  . Occupation: Conservation officer, historic buildings: [place]  . Financial resource strain: Not hard at all  . Food insecurity    Worry: Never true    Inability: Never true  . Transportation needs    Medical: No  Non-medical: No  Tobacco Use  . Smoking status: Never Smoker  . Smokeless tobacco: Never Used  Substance and Sexual Activity  . Alcohol use: No  . Drug use: No  . Sexual activity: Never  Lifestyle  . Physical activity    Days per week: 0 days    Minutes per session: 0 min  . Stress: Only a little  Relationships  . Social connections    Talks on phone: More than three times a week    Gets together: More than three times a week    Attends religious service: Not on file    Active member of club or organization: Not on file    Attends meetings of clubs or organizations: Not on file    Relationship status: Not on file  Other Topics Concern  . Not on file  Social History Narrative   Works at [employer] - husband died of lung cancer      No regular exercise   Additional Social History:                         Sleep: Fair  Appetite:  Good  Current Medications: Current Outpatient Medications  Medication Sig Dispense  Refill  . B Complex-C (SUPER B COMPLEX PO) Take by mouth.    . bisoprolol-hydrochlorothiazide (ZIAC) 5-6.25 MG tablet Take 1 tablet by mouth once daily 90 tablet 1  . doxycycline (VIBRAMYCIN) 100 MG capsule Take 1 capsule (100 mg total) by mouth 2 (two) times daily. 20 capsule 0  . Eszopiclone 3 MG TABS Take 1 tablet (3 mg total) by mouth at bedtime as needed. Take immediately before bedtime 30 tablet 5  . levothyroxine (SYNTHROID) 112 MCG tablet Take 1 tablet by mouth once daily 90 tablet 1  . venlafaxine XR (EFFEXOR-XR) 150 MG 24 hr capsule Take 1 capsule (150 mg total) by mouth daily. 30 capsule 7   No current facility-administered medications for this visit.     Lab Results: No results found for this or any previous visit (from the past 48 hour(s)).  Blood Alcohol level:  No results found for: [facility]  Physical Findings: AIMS:  , ,  ,  ,    CIWA:    COWS:     Musculoskeletal: Strength & Muscle Tone: within normal limits Gait & Station: normal Patient leans: N/A  Psychiatric Specialty Exam: ROS  There were no vitals taken for this visit.There is no height or weight on file to calculate BMI.  General Appearance: Fairly Groomed  Engineer, water::  Good  Speech:  Clear and Coherent  Volume:  Normal  Mood:  Euthymic  Affect:  Blunt  Thought Process:  Coherent  Orientation:  Full (Time, Place, and Person)  Thought Content:  WDL  Suicidal Thoughts:  No  Homicidal Thoughts:  No  Memory:  NA  Judgement:  NA  Insight:  Good  Psychomotor Activity:  Decreased  Concentration:  Fair  Recall:  [employer] of Knowledge:Good  Language: Fair  Akathisia:  No  Handed:  Right  AIMS (if indicated):     Assets:  Desire for Improvement  ADL's:  Intact  Cognition: WNL  Sleep:      Treatment Plan Summary: 06/08/2019, 4:12 PM  This patient is #1 problem is that of major depression in remission.  She continues taking Effexor as ordered.  Her second problem is that of insomnia.  She takes  Lunesta for this condition.  Together these 2 medicines this patient is very stable.  She is not suicidal.  She is functioning extremely well.  She is not fatigued.  She will be reevaluated again in 5 months.

## 2019-07-08 ENCOUNTER — Ambulatory Visit: Payer: PPO | Admitting: Internal Medicine

## 2019-07-11 NOTE — Progress Notes (Signed)
Subjective:    Patient ID: Paige [name], female    DOB: Aug 29, 1950, 68 y.o.   MRN: [id number]  HPI The patient is here for follow up.   She is not exercising regularly.    Hypertension: She is taking her medication daily. She is compliant with a low sodium diet.  She denies chest pain, palpitations, edema, shortness of breath and regular headaches. She does not monitor her blood pressure at home.    Hypothyroidism:  She is taking her medication daily.  She denies any recent changes in energy or weight that are unexplained.   prediabetes:  She is not always compliant with a low sugar/carbohydrate diet.  She is not exercising regularly.  Hyperlipidemia: She is controlling her cholesterol with diet She is compliant with a low fat/cholesterol diet.    Anxiety, depression, insomnia:  She follows with Dr Casimiro Needle.     Medications and allergies reviewed with patient and updated if appropriate.  Patient Active Problem List   Diagnosis Date Noted  . Chest wall pain 02/14/2019  . Right shoulder pain 02/14/2019  . Simple laceration of nose 02/14/2019  . Nasal fracture 02/13/2019  . Facial cellulitis 12/20/2018  . Prediabetes 07/05/2018  . Popping sound of knee joint 07/05/2018  . Chronic bilateral low back pain without sciatica 07/30/2017  . Hyperlipidemia 07/04/2017  . Piriformis syndrome of left side 04/29/2016  . Left hip pain 03/27/2016  . Lumbar radiculopathy 03/19/2016  . Neck pain on right side 01/16/2016  . Depression 06/09/2014  . AK (actinic keratosis) 10/19/2012  . CARCINOMA, BASAL CELL, BACK 11/16/2009  . ANXIETY DISORDER, GENERALIZED 07/30/2009  . BACK PAIN WITH RADICULOPATHY 05/28/2009  . Unspecified thrombosed hemorrhoids 10/17/2008  . CONSTIPATION, INTERMITTENT 12/20/2007  . OSTEOARTHRITIS 12/20/2007  . Hypothyroidism 06/08/2007  . Attention deficit hyperactivity disorder (ADHD) 06/08/2007  . Carpal tunnel syndrome 06/08/2007  . Essential hypertension  06/08/2007    Current Outpatient Medications on File Prior to Visit  Medication Sig Dispense Refill  . B Complex-C (SUPER B COMPLEX PO) Take by mouth.    . bisoprolol-hydrochlorothiazide (ZIAC) 5-6.25 MG tablet Take 1 tablet by mouth once daily 90 tablet 1  . Eszopiclone 3 MG TABS Take 1 tablet (3 mg total) by mouth at bedtime as needed. Take immediately before bedtime 30 tablet 5  . levothyroxine (SYNTHROID) 112 MCG tablet Take 1 tablet by mouth once daily 90 tablet 1  . venlafaxine XR (EFFEXOR-XR) 150 MG 24 hr capsule Take 1 capsule (150 mg total) by mouth daily. 30 capsule 7   No current facility-administered medications on file prior to visit.     Past Medical History:  Diagnosis Date  . ADD (attention deficit disorder)    Pt has hard time focusing  . Anxiety   . Arthritis   . Carpal tunnel syndrome   . Colon polyp   . Constipation   . Depression   . Gallstones   . GERD (gastroesophageal reflux disease)   . Hypertension   . Status post dilation of esophageal narrowing   . Thyroid disease     Past Surgical History:  Procedure Laterality Date  . ABDOMINAL HYSTERECTOMY    . CARPAL TUNNEL RELEASE Left   . CHOLECYSTECTOMY    . CLOSED REDUCTION HAND FRACTURE     right hand  . fx leg    . NASAL SINUS SURGERY    . SHOULDER SURGERY     reattac tendon; right shoulder  . TIBIA FRACTURE SURGERY  steel rod right leg  . TUBAL LIGATION      Social History   Socioeconomic History  . Marital status: Widowed    Spouse name: Not on file  . Number of children: 2  . Years of education: Not on file  . Highest education level: Not on file  Occupational History  . Occupation: retired    Fish farm manager: UNEMPLOYED  . Occupation: Conservation officer, historic buildings: [place]  . Financial resource strain: Not hard at all  . Food insecurity    Worry: Never true    Inability: Never true  . Transportation needs    Medical: No    Non-medical: No  Tobacco Use  .  Smoking status: Never Smoker  . Smokeless tobacco: Never Used  Substance and Sexual Activity  . Alcohol use: No  . Drug use: No  . Sexual activity: Never  Lifestyle  . Physical activity    Days per week: 0 days    Minutes per session: 0 min  . Stress: Only a little  Relationships  . Social connections    Talks on phone: More than three times a week    Gets together: More than three times a week    Attends religious service: Not on file    Active member of club or organization: Not on file    Attends meetings of clubs or organizations: Not on file    Relationship status: Not on file  Other Topics Concern  . Not on file  Social History Narrative   Works at [employer] - husband died of lung cancer      No regular exercise    Family History  Problem Relation Age of Onset  . Hyperlipidemia Mother   . Colon cancer Mother        52  . Lung cancer Father   . Cancer Paternal Grandfather        mets  . Leukemia Maternal Aunt     Review of Systems  Constitutional: Negative for chills and fever.  Respiratory: Negative for cough, shortness of breath and wheezing.   Cardiovascular: Positive for palpitations (occasional). Negative for chest pain and leg swelling.  Neurological: Positive for numbness (intermittent in right fingers 1-4) and headaches (occ).       Objective:   Vitals:   07/12/19 1359  BP: 122/84  Pulse: 78  Resp: 16  Temp: 98.2 F (36.8 C)  SpO2: 95%   BP Readings from Last 3 Encounters:  07/12/19 122/84  02/14/19 124/70  02/10/19 125/64   Wt Readings from Last 3 Encounters:  07/12/19 206 lb 12.8 oz (93.8 kg)  02/14/19 206 lb (93.4 kg)  02/10/19 200 lb 9.9 oz (91 kg)   Body mass index is 37.82 kg/m.   Physical Exam    Constitutional: Appears well-developed and well-nourished. No distress.  HENT:  Head: Normocephalic and atraumatic.  Neck: Neck supple. No tracheal deviation present. No thyromegaly present.  No  cervical lymphadenopathy Cardiovascular: Normal rate, regular rhythm and normal heart sounds.   No murmur heard. No carotid bruit .  No edema Pulmonary/Chest: Effort normal and breath sounds normal. No respiratory distress. No has no wheezes. No rales.  MSK:  Atrophy of right thenar process, no weakness in right hand Skin: Skin is warm and dry. Not diaphoretic.  Psychiatric: Normal mood and affect. Behavior is normal.      Assessment & Plan:    See  Problem List for Assessment and Plan of chronic medical problems.

## 2019-07-11 NOTE — Patient Instructions (Addendum)
  Tests ordered today. Your results will be released to [employer] (or called to you) after review.  If any changes need to be made, you will be notified at that same time.   Medications reviewed and updated.  Changes include :  none   Your prescription(s) have been submitted to your pharmacy. Please take as directed and contact our office if you believe you are having problem(s) with the medication(s).  A referral for orthopedics was ordered.   Please followup in 6 months

## 2019-07-12 ENCOUNTER — Ambulatory Visit (INDEPENDENT_AMBULATORY_CARE_PROVIDER_SITE_OTHER): Payer: PPO | Admitting: Internal Medicine

## 2019-07-12 ENCOUNTER — Other Ambulatory Visit (INDEPENDENT_AMBULATORY_CARE_PROVIDER_SITE_OTHER): Payer: PPO

## 2019-07-12 ENCOUNTER — Other Ambulatory Visit: Payer: Self-pay

## 2019-07-12 ENCOUNTER — Encounter: Payer: Self-pay | Admitting: Internal Medicine

## 2019-07-12 VITALS — BP 122/84 | HR 78 | Temp 98.2°F | Resp 16 | Ht 62.0 in | Wt 206.8 lb

## 2019-07-12 DIAGNOSIS — E039 Hypothyroidism, unspecified: Secondary | ICD-10-CM

## 2019-07-12 DIAGNOSIS — E782 Mixed hyperlipidemia: Secondary | ICD-10-CM

## 2019-07-12 DIAGNOSIS — I10 Essential (primary) hypertension: Secondary | ICD-10-CM

## 2019-07-12 DIAGNOSIS — R7303 Prediabetes: Secondary | ICD-10-CM

## 2019-07-12 DIAGNOSIS — G5601 Carpal tunnel syndrome, right upper limb: Secondary | ICD-10-CM | POA: Diagnosis not present

## 2019-07-12 LAB — COMPREHENSIVE METABOLIC PANEL
ALT: 10 U/L (ref 0–35)
AST: 23 U/L (ref 0–37)
Albumin: 4.2 g/dL (ref 3.5–5.2)
Alkaline Phosphatase: 98 U/L (ref 39–117)
BUN: 20 mg/dL (ref 6–23)
CO2: 31 mEq/L (ref 19–32)
Calcium: 10.1 mg/dL (ref 8.4–10.5)
Chloride: 97 mEq/L (ref 96–112)
Creatinine, Ser: 0.72 mg/dL (ref 0.40–1.20)
GFR: 80.23 mL/min (ref >60.00)
Glucose, Bld: 97 mg/dL (ref 70–99)
Potassium: 3.8 mEq/L (ref 3.5–5.1)
Sodium: 137 mEq/L (ref 135–145)
Total Bilirubin: 0.4 mg/dL (ref 0.2–1.2)
Total Protein: 8 g/dL (ref 6.0–8.3)

## 2019-07-12 LAB — LIPID PANEL
Cholesterol: 263 mg/dL — ABNORMAL HIGH (ref 0–200)
HDL: 62.3 mg/dL (ref >39.00)
LDL Cholesterol: 171 mg/dL — ABNORMAL HIGH (ref 0–99)
NonHDL: 200.71
Total CHOL/HDL Ratio: 4
Triglycerides: 149 mg/dL (ref 0.0–149.0)
VLDL: 29.8 mg/dL (ref 0.0–40.0)

## 2019-07-12 LAB — HEMOGLOBIN A1C: Hgb A1c MFr Bld: 6.2 % (ref 4.6–6.5)

## 2019-07-12 LAB — TSH: TSH: 0.84 u[IU]/mL (ref 0.35–4.50)

## 2019-07-12 MED ORDER — BISOPROLOL-HYDROCHLOROTHIAZIDE 5-6.25 MG PO TABS: 1.0000 | ORAL_TABLET | Freq: Every day | ORAL | 1 refills | Status: DC

## 2019-07-12 NOTE — Assessment & Plan Note (Signed)
BP well controlled Current regimen effective and well tolerated Continue current medications at current doses cmp  

## 2019-07-12 NOTE — Assessment & Plan Note (Signed)
Check a1c Low sugar / carb diet Stressed regular exercise   

## 2019-07-12 NOTE — Assessment & Plan Note (Signed)
Check lipid panel  Diet controlled Regular exercise and healthy diet encouraged  

## 2019-07-12 NOTE — Assessment & Plan Note (Signed)
Clinically euthyroid Check tsh  Titrate med dose if needed  

## 2019-07-12 NOTE — Assessment & Plan Note (Signed)
Right side N/T in right fingers 1-4 Thenar process atrophy on right Referred to ortho

## 2019-07-18 NOTE — Progress Notes (Signed)
Referral sent to [facility] and pt is aware

## 2019-07-23 ENCOUNTER — Other Ambulatory Visit: Payer: Self-pay | Admitting: Internal Medicine

## 2019-07-28 DIAGNOSIS — L570 Actinic keratosis: Secondary | ICD-10-CM | POA: Diagnosis not present

## 2019-07-28 DIAGNOSIS — L814 Other melanin hyperpigmentation: Secondary | ICD-10-CM | POA: Diagnosis not present

## 2019-07-28 DIAGNOSIS — L2089 Other atopic dermatitis: Secondary | ICD-10-CM | POA: Diagnosis not present

## 2019-07-28 DIAGNOSIS — L821 Other seborrheic keratosis: Secondary | ICD-10-CM | POA: Diagnosis not present

## 2019-07-28 DIAGNOSIS — D485 Neoplasm of uncertain behavior of skin: Secondary | ICD-10-CM | POA: Diagnosis not present

## 2019-07-28 DIAGNOSIS — Z85828 Personal history of other malignant neoplasm of skin: Secondary | ICD-10-CM | POA: Diagnosis not present

## 2019-07-28 DIAGNOSIS — D1801 Hemangioma of skin and subcutaneous tissue: Secondary | ICD-10-CM | POA: Diagnosis not present

## 2019-07-28 DIAGNOSIS — L905 Scar conditions and fibrosis of skin: Secondary | ICD-10-CM | POA: Diagnosis not present

## 2019-07-28 DIAGNOSIS — D225 Melanocytic nevi of trunk: Secondary | ICD-10-CM | POA: Diagnosis not present

## 2019-07-28 DIAGNOSIS — C44712 Basal cell carcinoma of skin of right lower limb, including hip: Secondary | ICD-10-CM | POA: Diagnosis not present

## 2019-08-17 ENCOUNTER — Other Ambulatory Visit (HOSPITAL_COMMUNITY): Payer: Self-pay | Admitting: Psychiatry

## 2019-09-21 ENCOUNTER — Telehealth: Payer: Self-pay | Admitting: Emergency Medicine

## 2019-09-21 NOTE — Telephone Encounter (Signed)
Yes, she should get the vaccine.  There is no contraindications or concerns with her having high blood pressure or being on blood pressure medication.

## 2019-09-21 NOTE — Telephone Encounter (Signed)
Pt called asking if it is ok to get her Covid vaccine even though she has blood pressure issues and also take blood pressure medication. Please give her a call back thanks.

## 2019-09-22 DIAGNOSIS — M79644 Pain in right finger(s): Secondary | ICD-10-CM

## 2019-09-22 DIAGNOSIS — M79641 Pain in right hand: Secondary | ICD-10-CM | POA: Diagnosis not present

## 2019-09-22 DIAGNOSIS — G5601 Carpal tunnel syndrome, right upper limb: Secondary | ICD-10-CM | POA: Diagnosis not present

## 2019-09-22 HISTORY — DX: Pain in right finger(s): M79.644

## 2019-09-22 NOTE — Telephone Encounter (Signed)
Pt aware of response.  

## 2019-10-25 ENCOUNTER — Other Ambulatory Visit: Payer: Self-pay | Admitting: Internal Medicine

## 2019-10-27 DIAGNOSIS — G5601 Carpal tunnel syndrome, right upper limb: Secondary | ICD-10-CM | POA: Diagnosis not present

## 2019-10-27 DIAGNOSIS — Z4789 Encounter for other orthopedic aftercare: Secondary | ICD-10-CM | POA: Diagnosis not present

## 2019-11-10 ENCOUNTER — Ambulatory Visit (INDEPENDENT_AMBULATORY_CARE_PROVIDER_SITE_OTHER): Payer: PPO | Admitting: Psychiatry

## 2019-11-10 ENCOUNTER — Other Ambulatory Visit: Payer: Self-pay

## 2019-11-10 DIAGNOSIS — F3342 Major depressive disorder, recurrent, in full remission: Secondary | ICD-10-CM

## 2019-11-10 DIAGNOSIS — R29898 Other symptoms and signs involving the musculoskeletal system: Secondary | ICD-10-CM | POA: Diagnosis not present

## 2019-11-10 MED ORDER — VENLAFAXINE HCL ER 150 MG PO CP24: 150.0000 mg | ORAL_CAPSULE | Freq: Every day | ORAL | 7 refills | Status: DC

## 2019-11-10 MED ORDER — ESZOPICLONE 3 MG PO TABS: 3.0000 mg | ORAL_TABLET | Freq: Every evening | ORAL | 5 refills | Status: DC | PRN

## 2019-11-10 NOTE — Progress Notes (Signed)
Patient ID: Paige [name], female   DOB: 13-Jun-1951, 69 y.o.   MRN: [id number] Patient ID: Paige [name], female   DOB: October 06, 1950, 69 y.o.   MRN: [id number] [facility] MD Progress Note  11/10/2019 2:16 PM [name]   Pa time.st Medical History:   MRN:  [id number] Diagnosis major depression in remission  Chief complaint depressed mood state  Today the patient is doing only fairly well.  Have surgery on her thumb.  She thinks is a form of carpal tunnels.  She just had the stitches out.  She has been off of work for a few weeks.  She is now going through physical therapy to build up the muscle in her thumb.  It is very important that she has thumbs that are working in her job.  Generally the patient is sleeping and eating well.  She had to take some prednisone which did affect her sleep a little bit but now the prednisone prednisone is gone and she is doing better.  Her energy level is fairly good.  She has a supportive family.  She really does not get back to work.  She drinks no alcohol.  She is never been psychotic.  She takes her medicines as prescribed.  She claims the Lunesta helps her sleep well.  Overall she is functioning reasonably well.  She likes her job working in the [employer]. Past Medical History:  Diagnosis Date  . ADD (attention deficit disorder)    Pt has hard time focusing  . Anxiety   . Arthritis   . Carpal tunnel syndrome   . Colon polyp   . Constipation   . Depression   . Gallstones   . GERD (gastroesophageal reflux disease)   . Hypertension   . Status post dilation of esophageal narrowing   . Thyroid disease     Past Surgical History:  Procedure Laterality Date  . ABDOMINAL HYSTERECTOMY    . CARPAL TUNNEL RELEASE Left   . CHOLECYSTECTOMY    . CLOSED REDUCTION HAND FRACTURE     right hand  . fx leg    . NASAL SINUS SURGERY    . SHOULDER SURGERY     reattac tendon; right shoulder  . TIBIA FRACTURE SURGERY      steel rod right leg  . TUBAL LIGATION     Family History:  Family History  Problem Relation Age of Onset  . Hyperlipidemia Mother   . Colon cancer Mother        48  . Lung cancer Father   . Cancer Paternal Grandfather        mets  . Leukemia Maternal Aunt    Family Psychiatric  History:  Social History:  Social History   Substance and Sexual Activity  Alcohol Use No     Social History   Substance and Sexual Activity  Drug Use No    Social History   Socioeconomic History  . Marital status: Widowed    Spouse name: Not on file  . Number of children: 2  . Years of education: Not on file  . Highest education level: Not on file  Occupational History  . Occupation: retired    Fish farm manager: UNEMPLOYED  . Occupation: Conservation officer, historic buildings: [employer]  Tobacco Use  . Smoking status: Never Smoker  . Smokeless tobacco: Never Used  Substance and Sexual Activity  . Alcohol use: No  . Drug use: No  . Sexual  activity: Never  Other Topics Concern  . Not on file  Social History Narrative   Works at [employer] - husband died of lung cancer      No regular exercise   Social Determinants of Health   Financial Resource Strain:   . Difficulty of Paying Living Expenses:   Food Insecurity:   . Worried About Charity fundraiser in the Last Year:   . Arboriculturist in the Last Year:   Transportation Needs:   . Film/video editor (Medical):   Marland Kitchen Lack of Transportation (Non-Medical):   Physical Activity:   . Days of Exercise per Week:   . Minutes of Exercise per Session:   Stress:   . Feeling of Stress :   Social Connections:   . Frequency of Communication with Friends and Family:   . Frequency of Social Gatherings with Friends and Family:   . Attends Religious Services:   . Active Member of Clubs or Organizations:   . Attends Archivist Meetings:   Marland Kitchen Marital Status:    Additional Social History:                          Sleep: Fair  Appetite:  Good  Current Medications: Current Outpatient Medications  Medication Sig Dispense Refill  . B Complex-C (SUPER B COMPLEX PO) Take by mouth.    . bisoprolol-hydrochlorothiazide (ZIAC) 5-6.25 MG tablet Take 1 tablet by mouth daily. 90 tablet 1  . Eszopiclone 3 MG TABS Take 1 tablet (3 mg total) by mouth at bedtime as needed. Take immediately before bedtime 30 tablet 5  . EUTHYROX 112 MCG tablet Take 1 tablet by mouth once daily 90 tablet 0  . venlafaxine XR (EFFEXOR-XR) 150 MG 24 hr capsule Take 1 capsule (150 mg total) by mouth daily. 30 capsule 7   No current facility-administered medications for this visit.    Lab Results: No results found for this or any previous visit (from the past 48 hour(s)).  Blood Alcohol level:  No results found for: [facility]  Physical Findings: AIMS:  , ,  ,  ,    CIWA:    COWS:     Musculoskeletal: Strength & Muscle Tone: within normal limits Gait & Station: normal Patient leans: N/A  Psychiatric Specialty Exam: ROS  There were no vitals taken for this visit.There is no height or weight on file to calculate BMI.  General Appearance: Fairly Groomed  Engineer, water::  Good  Speech:  Clear and Coherent  Volume:  Normal  Mood:  Euthymic  Affect:  Blunt  Thought Process:  Coherent  Orientation:  Full (Time, Place, and Person)  Thought Content:  WDL  Suicidal Thoughts:  No  Homicidal Thoughts:  No  Memory:  NA  Judgement:  NA  Insight:  Good  Psychomotor Activity:  Decreased  Concentration:  Fair  Recall:  [employer] of Knowledge:Good  Language: Fair  Akathisia:  No  Handed:  Right  AIMS (if indicated):     Assets:  Desire for Improvement  ADL's:  Intact  Cognition: WNL  Sleep:      Treatment Plan Summary: 11/10/2019, 2:16 PM  This patient is to problems.  First is that of major depression.  She takes her Effexor regularly.  She denies anhedonia and she denies persistent daily depression.  Her second  problem is that of insomnia.  She takes  Lunesta 3 mg every night and gets a good night sleep.  Generally she is functioning fairly well.  She wants to get back to work.  I will see her again in 4 months.  She has had her vaccines.

## 2019-11-22 DIAGNOSIS — R29898 Other symptoms and signs involving the musculoskeletal system: Secondary | ICD-10-CM | POA: Diagnosis not present

## 2020-01-05 ENCOUNTER — Ambulatory Visit (INDEPENDENT_AMBULATORY_CARE_PROVIDER_SITE_OTHER): Payer: PPO

## 2020-01-05 DIAGNOSIS — Z0000 Encounter for general adult medical examination without abnormal findings: Secondary | ICD-10-CM

## 2020-01-05 DIAGNOSIS — Z1231 Encounter for screening mammogram for malignant neoplasm of breast: Secondary | ICD-10-CM | POA: Diagnosis not present

## 2020-01-05 NOTE — Progress Notes (Addendum)
Subjective:   Paige [name] is a 69 y.o. female who presents for Medicare Annual (Subsequent) preventive examination.  I connected with Lorinda Saragoza today by telephone and verified that I am speaking with the correct person using two identifiers. Location patient: home Location provider: work Persons participating in the virtual visit: Chylee Konkel and Lisette Abu, LPN.   I discussed the limitations, risks, security and privacy concerns of performing an evaluation and management service by telephone and the availability of in person appointments. I also discussed with the patient that there may be a patient responsible charge related to this service. The patient expressed understanding and verbally consented to this telephonic visit.    Interactive audio and video telecommunications were attempted between this provider and patient, however failed, due to patient having technical difficulties OR patient did not have access to video capability.  We continued and completed visit with audio only.  Some vital signs may be absent or patient reported.   Time Spent with patient on telephone encounter: 30 minutes   Review of Systems:   Cardiac Risk Factors include: advanced age (>13men, >73 women);dyslipidemia;family history of premature cardiovascular disease;hypertension     Objective:     Vitals: There were no vitals taken for this visit.  There is no height or weight on file to calculate BMI.  Advanced Directives 01/05/2020 02/11/2018 09/01/2016  Does Patient Have a Medical Advance Directive? No No No  Would patient like information on creating a medical advance directive? - Yes (ED - Information included in AVS) -    Tobacco Social History   Tobacco Use  Smoking Status Never Smoker  Smokeless Tobacco Never Used     Counseling given: Not Answered   Clinical Intake:  Pre-visit preparation completed: Yes  Pain : No/denies pain     Nutritional Risks:  None Diabetes: No  How often do you need to have someone help you when you read instructions, pamphlets, or other written materials from your doctor or pharmacy?: 1 - Never What is the last grade level you completed in school?: HSG  Interpreter Needed?: No  Information entered by :: Sheral Flow, LPN  Past Medical History:  Diagnosis Date  . ADD (attention deficit disorder)    Pt has hard time focusing  . Anxiety   . Arthritis   . Carpal tunnel syndrome   . Colon polyp   . Constipation   . Depression   . Gallstones   . GERD (gastroesophageal reflux disease)   . Hypertension   . Status post dilation of esophageal narrowing   . Thyroid disease    Past Surgical History:  Procedure Laterality Date  . ABDOMINAL HYSTERECTOMY    . CARPAL TUNNEL RELEASE Left   . CHOLECYSTECTOMY    . CLOSED REDUCTION HAND FRACTURE     right hand  . fx leg    . NASAL SINUS SURGERY    . SHOULDER SURGERY     reattac tendon; right shoulder  . TIBIA FRACTURE SURGERY     steel rod right leg  . TUBAL LIGATION     Family History  Problem Relation Age of Onset  . Hyperlipidemia Mother   . Colon cancer Mother        23  . Lung cancer Father   . Cancer Paternal Grandfather        mets  . Leukemia Maternal Aunt    Social History   Socioeconomic History  . Marital status: Widowed    Spouse  name: Not on file  . Number of children: 2  . Years of education: Not on file  . Highest education level: High school graduate  Occupational History  . Occupation: retired    Fish farm manager: UNEMPLOYED  . Occupation: Conservation officer, historic buildings: [employer]  Tobacco Use  . Smoking status: Never Smoker  . Smokeless tobacco: Never Used  Substance and Sexual Activity  . Alcohol use: No  . Drug use: No  . Sexual activity: Never  Other Topics Concern  . Not on file  Social History Narrative   Works at [employer] - husband died of lung cancer      No regular exercise    Social Determinants of Health   Financial Resource Strain: Low Risk   . Difficulty of Paying Living Expenses: Not hard at all  Food Insecurity: No Food Insecurity  . Worried About Charity fundraiser in the Last Year: Never true  . Ran Out of Food in the Last Year: Never true  Transportation Needs: No Transportation Needs  . Lack of Transportation (Medical): No  . Lack of Transportation (Non-Medical): No  Physical Activity: Sufficiently Active  . Days of Exercise per Week: 5 days  . Minutes of Exercise per Session: 150+ min  Stress:   . Feeling of Stress :   Social Connections: Somewhat Isolated  . Frequency of Communication with Friends and Family: More than three times a week  . Frequency of Social Gatherings with Friends and Family: Three times a week  . Attends Religious Services: 1 to 4 times per year  . Active Member of Clubs or Organizations: No  . Attends Archivist Meetings: Never  . Marital Status: Widowed    Outpatient Encounter Medications as of 01/05/2020  Medication Sig  . B Complex-C (SUPER B COMPLEX PO) Take by mouth.  . bisoprolol-hydrochlorothiazide (ZIAC) 5-6.25 MG tablet Take 1 tablet by mouth daily.  . Eszopiclone 3 MG TABS Take 1 tablet (3 mg total) by mouth at bedtime as needed. Take immediately before bedtime  . EUTHYROX 112 MCG tablet Take 1 tablet by mouth once daily  . venlafaxine XR (EFFEXOR-XR) 150 MG 24 hr capsule Take 1 capsule (150 mg total) by mouth daily.   No facility-administered encounter medications on file as of 01/05/2020.    Activities of Daily Living In your present state of health, do you have any difficulty performing the following activities: 01/05/2020  Hearing? N  Vision? N  Comment uses glasses  Difficulty concentrating or making decisions? Y  Comment at times  Walking or climbing stairs? N  Dressing or bathing? N  Doing errands, shopping? N  Preparing Food and eating ? N  Using the Toilet? N  In the past six  months, have you accidently leaked urine? Y  Do you have problems with loss of bowel control? N  Managing your Medications? N  Managing your Finances? N  Housekeeping or managing your Housekeeping? N  Some recent data might be hidden    Patient Care Team: Binnie Rail, MD as PCP - General (Internal Medicine) Norma Fredrickson, MD as Consulting Physician (Psychiatry) Roseanne Kaufman, MD as Consulting Physician (Orthopedic Surgery) Calvert Cantor, MD as Consulting Physician (Ophthalmology)    Assessment:   This is a routine wellness examination for [place].  Exercise Activities and Dietary recommendations Current Exercise Habits: Home exercise routine, Type of exercise: walking, Time (Minutes): 30, Frequency (Times/Week): 5, Weekly Exercise (Minutes/Week):  150, Intensity: Moderate, Exercise limited by: None identified  Goals Addressed            This Visit's Progress   . Patient Stated       Try to lose at least 60 pounds.       Fall Risk: Fall Risk  01/05/2020 01/04/2019 12/20/2018 02/11/2018 07/03/2017  Falls in the past year? 0 0 0 No No  Number falls in past yr: 0 0 - - -  Injury with Fall? 0 - - - -  Risk for fall due to : No Fall Risks - - - -  Follow up Falls evaluation completed;Falls prevention discussed - - - -    FALL RISK PREVENTION PERTAINING TO THE HOME:  Any stairs in or around the home? No  If so, are there any without handrails? No   Home free of loose throw rugs in walkways, pet beds, electrical cords, etc? Yes  Adequate lighting in your home to reduce risk of falls? Yes   ASSISTIVE DEVICES UTILIZED TO PREVENT FALLS:  Life alert? No  Use of a cane, walker or w/c? No  Grab bars in the bathroom? Yes  Shower chair or bench in shower? No  Elevated toilet seat or a handicapped toilet? No    Depression Screen PHQ 2/9 Scores 01/05/2020 01/05/2020 02/11/2018 07/03/2017  PHQ - 2 Score 0 0 2 0  PHQ- 9 Score - - 6 -     Cognitive Function MMSE - Mini Mental  State Exam 02/11/2018  Orientation to time 5  Orientation to Place 5  Registration 3  Attention/ Calculation 5  Recall 1  Language- name 2 objects 2  Language- repeat 1  Language- follow 3 step command 3  Language- read & follow direction 1  Write a sentence 1  Copy design 1  Total score 28        Immunization History  Administered Date(s) Administered  . Fluad Quad(high Dose 65+) 05/16/2019  . Influenza, High Dose Seasonal PF 06/27/2016, 07/03/2017, 07/05/2018  . Pneumococcal Conjugate-13 07/18/2016  . Pneumococcal Polysaccharide-23 05/25/2015  . Tdap 03/03/2012, 02/10/2019    Qualifies for Shingles Vaccine? Yes  Zostavax completed n/a. Due for Shingrix. Education has been provided regarding the importance of this vaccine. Pt has been advised to call insurance company to determine out of pocket expense. Advised may also receive vaccine at local pharmacy or Health Dept. Verbalized acceptance and understanding.  Covid-19 Vaccine:  Completed vaccines; patient unsure of dates will bring in immunization card.  Screening Tests Health Maintenance  Topic Date Due  . COVID-19 Vaccine (1) Never done  . INFLUENZA VACCINE  03/18/2020  . MAMMOGRAM  04/08/2020  . DEXA SCAN  06/17/2020  . COLONOSCOPY  09/30/2021  . TETANUS/TDAP  02/09/2029  . Hepatitis C Screening  Completed  . PNA vac Low Risk Adult  Completed    Cancer Screenings:  Colorectal Screening: Completed on 09/30/2016. Repeat every 5 years  Mammogram: Completed on 04/08/2018. Repeat every year; Ordered  Bone Density: Completed 06/18/2015   Lung Cancer Screening: (Low Dose CT Chest recommended if Age 49-80 years, 30 pack-year currently smoking OR have quit w/in 15years.) does not qualify.   Additional Screening:  Hepatitis C Screening: does qualify; Completed 2017  Vision Screening: Recommended annual ophthalmology exams for early detection of glaucoma and other disorders of the eye. Is the patient up to date with  their annual eye exam?  Yes , [facility]  Dental Screening: Recommended annual dental exams  for proper oral hygiene  Community Resource Referral:  CRR required this visit?  No       Plan:  I have personally reviewed and addressed the Medicare Annual Wellness questionnaire and have noted the following in the patient's chart:  A. Medical and social history B. Use of alcohol, tobacco or illicit drugs  C. Current medications and supplements D. Functional ability and status E.  Nutritional status F.  Physical activity G. Advance directives H. List of other physicians I.  Hospitalizations, surgeries, and ER visits in previous 12 months J.  [place] such as hearing and vision if needed, cognitive and depression L. Referrals and appointments   In addition, I have reviewed and discussed with patient certain preventive protocols, quality metrics, and best practice recommendations. A written personalized care plan for preventive services as well as general preventive health recommendations were provided to patient.  Nena Alexander, LPN  [id number] Nurse Health Advisor   Nurse Notes: none    Medical screening examination/treatment/procedure(s) were performed by non-physician practitioner and as supervising physician I was immediately available for consultation/collaboration. I agree with above. Binnie Rail, MD

## 2020-01-05 NOTE — Patient Instructions (Signed)
[name] , Thank you for taking time to come for your Medicare Wellness Visit. I appreciate your ongoing commitment to your health goals. Please review the following plan we discussed and let me know if I can assist you in the future.   Screening recommendations/referrals: Colonoscopy: last done 09/30/2016; repeat every 5 years Mammogram: last done on 04/08/2018; Please call [phone] to schedule your mammogram. ([place]) Bone Density: last done on 06/18/2015 Recommended yearly ophthalmology/optometry visit for glaucoma screening and checkup Recommended yearly dental visit for hygiene and checkup  Vaccinations: Influenza vaccine: 05/16/2019 Pneumococcal vaccine: completed Tdap vaccine: 02/10/2019; due every 10 years Shingles vaccine: Shingles eligible; will check with provider office   Covid-19: completed (please make sure to bring card)  Advanced directives: Advance directive discussed with you today.  Once this is complete please bring a copy in to our office so we can scan it into your chart.   Conditions/risks identified: Discussed goal of weight lose.  Next appointment: schedule medicare wellness visit in 1 year   Preventive Care 70 Years and Older, Female Preventive care refers to lifestyle choices and visits with your health care provider that can promote health and wellness. What does preventive care include?  A yearly physical exam. This is also called an annual well check.  Dental exams once or twice a year.  Routine eye exams. Ask your health care provider how often you should have your eyes checked.  Personal lifestyle choices, including:  Daily care of your teeth and gums.  Regular physical activity.  Eating a healthy diet.  Avoiding tobacco and drug use.  Limiting alcohol use.  Practicing safe sex.  Taking low-dose aspirin every day.  Taking vitamin and mineral supplements as recommended by your health care provider. What  happens during an annual well check? The services and screenings done by your health care provider during your annual well check will depend on your age, overall health, lifestyle risk factors, and family history of disease. Counseling  Your health care provider may ask you questions about your:  Alcohol use.  Tobacco use.  Drug use.  Emotional well-being.  Home and relationship well-being.  Sexual activity.  Eating habits.  History of falls.  Memory and ability to understand (cognition).  Work and work Statistician.  Reproductive health. Screening  You may have the following tests or measurements:  Height, weight, and BMI.  Blood pressure.  Lipid and cholesterol levels. These may be checked every 5 years, or more frequently if you are over 20 years old.  Skin check.  Lung cancer screening. You may have this screening every year starting at age 69 if you have a 30-pack-year history of smoking and currently smoke or have quit within the past 15 years.  Fecal occult blood test (FOBT) of the stool. You may have this test every year starting at age 2.  Flexible sigmoidoscopy or colonoscopy. You may have a sigmoidoscopy every 5 years or a colonoscopy every 10 years starting at age 52.  Hepatitis C blood test.  Hepatitis B blood test.  Sexually transmitted disease (STD) testing.  Diabetes screening. This is done by checking your blood sugar (glucose) after you have not eaten for a while (fasting). You may have this done every 1-3 years.  Bone density scan. This is done to screen for osteoporosis. You may have this done starting at age 60.  Mammogram. This may be done every 1-2 years. Talk to your health care provider about how often you should  have regular mammograms. Talk with your health care provider about your test results, treatment options, and if necessary, the need for more tests. Vaccines  Your health care provider may recommend certain vaccines, such  as:  Influenza vaccine. This is recommended every year.  Tetanus, diphtheria, and acellular pertussis (Tdap, Td) vaccine. You may need a Td booster every 10 years.  Zoster vaccine. You may need this after age 15.  Pneumococcal 13-valent conjugate (PCV13) vaccine. One dose is recommended after age 32.  Pneumococcal polysaccharide (PPSV23) vaccine. One dose is recommended after age 70. Talk to your health care provider about which screenings and vaccines you need and how often you need them. This information is not intended to replace advice given to you by your health care provider. Make sure you discuss any questions you have with your health care provider. Document Released: 08/31/2015 Document Revised: 04/23/2016 Document Reviewed: 06/05/2015 Elsevier Interactive Patient Education  2017 [place] Prevention in the Home Falls can cause injuries. They can happen to people of all ages. There are many things you can do to make your home safe and to help prevent falls. What can I do on the outside of my home?  Regularly fix the edges of walkways and driveways and fix any cracks.  Remove anything that might make you trip as you walk through a door, such as a raised step or threshold.  Trim any bushes or trees on the path to your home.  Use bright outdoor lighting.  Clear any walking paths of anything that might make someone trip, such as rocks or tools.  Regularly check to see if handrails are loose or broken. Make sure that both sides of any steps have handrails.  Any raised decks and porches should have guardrails on the edges.  Have any leaves, snow, or ice cleared regularly.  Use sand or salt on walking paths during winter.  Clean up any spills in your garage right away. This includes oil or grease spills. What can I do in the bathroom?  Use night lights.  Install grab bars by the toilet and in the tub and shower. Do not use towel bars as grab bars.  Use  non-skid mats or decals in the tub or shower.  If you need to sit down in the shower, use a plastic, non-slip stool.  Keep the floor dry. Clean up any water that spills on the floor as soon as it happens.  Remove soap buildup in the tub or shower regularly.  Attach bath mats securely with double-sided non-slip rug tape.  Do not have throw rugs and other things on the floor that can make you trip. What can I do in the bedroom?  Use night lights.  Make sure that you have a light by your bed that is easy to reach.  Do not use any sheets or blankets that are too big for your bed. They should not hang down onto the floor.  Have a firm chair that has side arms. You can use this for support while you get dressed.  Do not have throw rugs and other things on the floor that can make you trip. What can I do in the kitchen?  Clean up any spills right away.  Avoid walking on wet floors.  Keep items that you use a lot in easy-to-reach places.  If you need to reach something above you, use a strong step stool that has a grab bar.  Keep electrical cords out of  the way.  Do not use floor polish or wax that makes floors slippery. If you must use wax, use non-skid floor wax.  Do not have throw rugs and other things on the floor that can make you trip. What can I do with my stairs?  Do not leave any items on the stairs.  Make sure that there are handrails on both sides of the stairs and use them. Fix handrails that are broken or loose. Make sure that handrails are as long as the stairways.  Check any carpeting to make sure that it is firmly attached to the stairs. Fix any carpet that is loose or worn.  Avoid having throw rugs at the top or bottom of the stairs. If you do have throw rugs, attach them to the floor with carpet tape.  Make sure that you have a light switch at the top of the stairs and the bottom of the stairs. If you do not have them, ask someone to add them for you. What  else can I do to help prevent falls?  Wear shoes that:  Do not have high heels.  Have rubber bottoms.  Are comfortable and fit you well.  Are closed at the toe. Do not wear sandals.  If you use a stepladder:  Make sure that it is fully opened. Do not climb a closed stepladder.  Make sure that both sides of the stepladder are locked into place.  Ask someone to hold it for you, if possible.  Clearly mark and make sure that you can see:  Any grab bars or handrails.  First and last steps.  Where the edge of each step is.  Use tools that help you move around (mobility aids) if they are needed. These include:  Canes.  Walkers.  Scooters.  Crutches.  Turn on the lights when you go into a dark area. Replace any light bulbs as soon as they burn out.  Set up your furniture so you have a clear path. Avoid moving your furniture around.  If any of your floors are uneven, fix them.  If there are any pets around you, be aware of where they are.  Review your medicines with your doctor. Some medicines can make you feel dizzy. This can increase your chance of falling. Ask your doctor what other things that you can do to help prevent falls. This information is not intended to replace advice given to you by your health care provider. Make sure you discuss any questions you have with your health care provider. Document Released: 05/31/2009 Document Revised: 01/10/2016 Document Reviewed: 09/08/2014 Elsevier Interactive Patient Education  2017 [employer].

## 2020-01-08 NOTE — Patient Instructions (Addendum)
  Blood work was ordered.     Medications reviewed and updated.  Changes include :   none  Your prescription(s) have been submitted to your pharmacy. Please take as directed and contact our office if you believe you are having problem(s) with the medication(s).   Please followup in 6 months   

## 2020-01-08 NOTE — Progress Notes (Signed)
Subjective:    Patient ID: ARTERIA BOODOO, female    DOB: 08/21/1950, 69 y.o.   MRN: [id number]  HPI The patient is here for follow up of their chronic medical problems, including hypertension, hypothyroidism, prediabetes, hyperlipidemia   She is not exercising regularly, but is very active at work.       Medications and allergies reviewed with patient and updated if appropriate.  Patient Active Problem List   Diagnosis Date Noted  . Deviated septum 02/21/2019  . Chest wall pain 02/14/2019  . Right shoulder pain 02/14/2019  . Nasal fracture 02/13/2019  . Facial cellulitis 12/20/2018  . Prediabetes 07/05/2018  . Popping sound of knee joint 07/05/2018  . Chronic bilateral low back pain without sciatica 07/30/2017  . Hyperlipidemia 07/04/2017  . Piriformis syndrome of left side 04/29/2016  . Left hip pain 03/27/2016  . Lumbar radiculopathy 03/19/2016  . Neck pain on right side 01/16/2016  . Depression 06/09/2014  . AK (actinic keratosis) 10/19/2012  . CARCINOMA, BASAL CELL, BACK 11/16/2009  . ANXIETY DISORDER, GENERALIZED 07/30/2009  . BACK PAIN WITH RADICULOPATHY 05/28/2009  . Unspecified thrombosed hemorrhoids 10/17/2008  . CONSTIPATION, INTERMITTENT 12/20/2007  . OSTEOARTHRITIS 12/20/2007  . Hypothyroidism 06/08/2007  . Attention deficit hyperactivity disorder (ADHD) 06/08/2007  . Carpal tunnel syndrome 06/08/2007  . Essential hypertension 06/08/2007    Current Outpatient Medications on File Prior to Visit  Medication Sig Dispense Refill  . B Complex-C (SUPER B COMPLEX PO) Take by mouth.    . bisoprolol-hydrochlorothiazide (ZIAC) 5-6.25 MG tablet Take 1 tablet by mouth daily. 90 tablet 1  . Eszopiclone 3 MG TABS Take 1 tablet (3 mg total) by mouth at bedtime as needed. Take immediately before bedtime 30 tablet 5  . EUTHYROX 112 MCG tablet Take 1 tablet by mouth once daily 90 tablet 0  . venlafaxine XR (EFFEXOR-XR) 150 MG 24 hr capsule Take 1 capsule (150 mg  total) by mouth daily. 30 capsule 7   No current facility-administered medications on file prior to visit.    Past Medical History:  Diagnosis Date  . ADD (attention deficit disorder)    Pt has hard time focusing  . Anxiety   . Arthritis   . Carpal tunnel syndrome   . Colon polyp   . Constipation   . Depression   . Gallstones   . GERD (gastroesophageal reflux disease)   . Hypertension   . Status post dilation of esophageal narrowing   . Thyroid disease     Past Surgical History:  Procedure Laterality Date  . ABDOMINAL HYSTERECTOMY    . CARPAL TUNNEL RELEASE Left   . CHOLECYSTECTOMY    . CLOSED REDUCTION HAND FRACTURE     right hand  . fx leg    . NASAL SINUS SURGERY    . SHOULDER SURGERY     reattac tendon; right shoulder  . TIBIA FRACTURE SURGERY     steel rod right leg  . TUBAL LIGATION      Social History   Socioeconomic History  . Marital status: Widowed    Spouse name: Not on file  . Number of children: 2  . Years of education: Not on file  . Highest education level: High school graduate  Occupational History  . Occupation: retired    Fish farm manager: UNEMPLOYED  . Occupation: Conservation officer, historic buildings: [employer]  Tobacco Use  . Smoking status: Never Smoker  . Smokeless tobacco: Never Used  Substance and Sexual Activity  .  Alcohol use: No  . Drug use: No  . Sexual activity: Never  Other Topics Concern  . Not on file  Social History Narrative   Works at [employer] - husband died of lung cancer      No regular exercise   Social Determinants of Health   Financial Resource Strain: Low Risk   . Difficulty of Paying Living Expenses: Not hard at all  Food Insecurity: No Food Insecurity  . Worried About Charity fundraiser in the Last Year: Never true  . Ran Out of Food in the Last Year: Never true  Transportation Needs: No Transportation Needs  . Lack of Transportation (Medical): No  . Lack of Transportation  (Non-Medical): No  Physical Activity: Sufficiently Active  . Days of Exercise per Week: 5 days  . Minutes of Exercise per Session: 150+ min  Stress:   . Feeling of Stress :   Social Connections: Somewhat Isolated  . Frequency of Communication with Friends and Family: More than three times a week  . Frequency of Social Gatherings with Friends and Family: Three times a week  . Attends Religious Services: 1 to 4 times per year  . Active Member of Clubs or Organizations: No  . Attends Archivist Meetings: Never  . Marital Status: Widowed    Family History  Problem Relation Age of Onset  . Hyperlipidemia Mother   . Colon cancer Mother        25  . Lung cancer Father   . Cancer Paternal Grandfather        mets  . Leukemia Maternal Aunt     Review of Systems  Constitutional: Negative for chills and fever.       Energy level good  Respiratory: Negative for cough, shortness of breath and wheezing.   Cardiovascular: Negative for chest pain, palpitations and leg swelling.  Neurological: Positive for headaches (occ). Negative for light-headedness.       Objective:   Vitals:   01/09/20 1428  BP: 124/70  Pulse: 72  Resp: 16  Temp: 98.4 F (36.9 C)  SpO2: 97%   BP Readings from Last 3 Encounters:  01/09/20 124/70  07/12/19 122/84  02/14/19 124/70   Wt Readings from Last 3 Encounters:  01/09/20 206 lb (93.4 kg)  07/12/19 206 lb 12.8 oz (93.8 kg)  02/14/19 206 lb (93.4 kg)   Body mass index is 37.68 kg/m.   Physical Exam    Constitutional: Appears well-developed and well-nourished. No distress.  HENT:  Head: Normocephalic and atraumatic.  Neck: Neck supple. No tracheal deviation present. No thyromegaly present.  No cervical lymphadenopathy Cardiovascular: Normal rate, regular rhythm and normal heart sounds.  No murmur heard. No carotid bruit .  No edema Pulmonary/Chest: Effort normal and breath sounds normal. No respiratory distress. No has no wheezes. No  rales.  Skin: Skin is warm and dry. Not diaphoretic.  Psychiatric: Normal mood and affect. Behavior is normal.    The 10-year ASCVD risk score Mikey Bussing DC Jr., et al., 2013) is: 11.4%   Values used to calculate the score:     Age: 69 years     Sex: Female     Is Non-Hispanic African American: No     Diabetic: No     Tobacco smoker: No     Systolic Blood Pressure: [id number] mmHg     Is BP treated: Yes     HDL Cholesterol: 62.3 mg/dL  Total Cholesterol: 263 mg/dL   Assessment & Plan:    See Problem List for Assessment and Plan of chronic medical problems.    This visit occurred during the SARS-CoV-2 public health emergency.  Safety protocols were in place, including screening questions prior to the visit, additional usage of staff PPE, and extensive cleaning of exam room while observing appropriate contact time as indicated for disinfecting solutions.

## 2020-01-09 ENCOUNTER — Ambulatory Visit (INDEPENDENT_AMBULATORY_CARE_PROVIDER_SITE_OTHER): Payer: PPO | Admitting: Internal Medicine

## 2020-01-09 ENCOUNTER — Encounter: Payer: Self-pay | Admitting: Internal Medicine

## 2020-01-09 ENCOUNTER — Other Ambulatory Visit: Payer: Self-pay

## 2020-01-09 VITALS — BP 124/70 | HR 72 | Temp 98.4°F | Resp 16 | Ht 62.0 in | Wt 206.0 lb

## 2020-01-09 DIAGNOSIS — R7303 Prediabetes: Secondary | ICD-10-CM | POA: Diagnosis not present

## 2020-01-09 DIAGNOSIS — E782 Mixed hyperlipidemia: Secondary | ICD-10-CM | POA: Diagnosis not present

## 2020-01-09 DIAGNOSIS — I10 Essential (primary) hypertension: Secondary | ICD-10-CM

## 2020-01-09 DIAGNOSIS — E039 Hypothyroidism, unspecified: Secondary | ICD-10-CM | POA: Diagnosis not present

## 2020-01-09 LAB — COMPREHENSIVE METABOLIC PANEL
ALT: 11 U/L (ref 0–35)
AST: 25 U/L (ref 0–37)
Albumin: 4.5 g/dL (ref 3.5–5.2)
Alkaline Phosphatase: 84 U/L (ref 39–117)
BUN: 22 mg/dL (ref 6–23)
CO2: 28 mEq/L (ref 19–32)
Calcium: 9.6 mg/dL (ref 8.4–10.5)
Chloride: 101 mEq/L (ref 96–112)
Creatinine, Ser: 0.62 mg/dL (ref 0.40–1.20)
GFR: 95.2 mL/min (ref >60.00)
Glucose, Bld: 94 mg/dL (ref 70–99)
Potassium: 3.7 mEq/L (ref 3.5–5.1)
Sodium: 138 mEq/L (ref 135–145)
Total Bilirubin: 0.4 mg/dL (ref 0.2–1.2)
Total Protein: 7.5 g/dL (ref 6.0–8.3)

## 2020-01-09 LAB — LIPID PANEL
Cholesterol: 221 mg/dL — ABNORMAL HIGH (ref 0–200)
HDL: 58.9 mg/dL (ref >39.00)
LDL Cholesterol: 139 mg/dL — ABNORMAL HIGH (ref 0–99)
NonHDL: 162.33
Total CHOL/HDL Ratio: 4
Triglycerides: 119 mg/dL (ref 0.0–149.0)
VLDL: 23.8 mg/dL (ref 0.0–40.0)

## 2020-01-09 LAB — TSH: TSH: 0.48 u[IU]/mL (ref 0.35–4.50)

## 2020-01-09 LAB — HEMOGLOBIN A1C: Hgb A1c MFr Bld: 6.2 % (ref 4.6–6.5)

## 2020-01-09 NOTE — Assessment & Plan Note (Signed)
Chronic BP well controlled Current regimen effective and well tolerated Continue current medications at current doses cmp  

## 2020-01-09 NOTE — Assessment & Plan Note (Signed)
Chronic Check a1c Low sugar / carb diet Stressed regular exercise  

## 2020-01-09 NOTE — Assessment & Plan Note (Signed)
Chronic  Clinically euthyroid Check tsh  Titrate med dose if needed  

## 2020-01-09 NOTE — Assessment & Plan Note (Addendum)
Chronic Check lipid panel  Diet controlled, would prefer to avoid medication Regular exercise and healthy diet encouraged

## 2020-01-12 ENCOUNTER — Other Ambulatory Visit: Payer: Self-pay | Admitting: Internal Medicine

## 2020-01-24 ENCOUNTER — Other Ambulatory Visit: Payer: Self-pay | Admitting: Internal Medicine

## 2020-02-03 ENCOUNTER — Ambulatory Visit
Admission: RE | Admit: 2020-02-03 | Discharge: 2020-02-03 | Disposition: A | Discharge status: Home / Self Care | Payer: PPO | Source: Ambulatory Visit | Attending: Internal Medicine | Admitting: Internal Medicine

## 2020-02-03 ENCOUNTER — Other Ambulatory Visit: Payer: Self-pay

## 2020-02-03 DIAGNOSIS — Z1231 Encounter for screening mammogram for malignant neoplasm of breast: Secondary | ICD-10-CM

## 2020-03-14 ENCOUNTER — Encounter (HOSPITAL_COMMUNITY): Payer: Self-pay | Admitting: Psychiatry

## 2020-03-14 ENCOUNTER — Other Ambulatory Visit: Payer: Self-pay

## 2020-03-14 ENCOUNTER — Ambulatory Visit (INDEPENDENT_AMBULATORY_CARE_PROVIDER_SITE_OTHER): Payer: PPO | Admitting: Psychiatry

## 2020-03-14 DIAGNOSIS — F325 Major depressive disorder, single episode, in full remission: Secondary | ICD-10-CM | POA: Diagnosis not present

## 2020-03-14 MED ORDER — ESZOPICLONE 3 MG PO TABS: 3.0000 mg | ORAL_TABLET | Freq: Every evening | ORAL | 5 refills | Status: DC | PRN

## 2020-03-14 MED ORDER — VENLAFAXINE HCL ER 37.5 MG PO CP24: ORAL_CAPSULE | ORAL | 1 refills | Status: DC

## 2020-03-14 MED ORDER — BUPROPION HCL ER (XL) 150 MG PO TB24: ORAL_TABLET | ORAL | 3 refills | Status: DC

## 2020-03-14 NOTE — Progress Notes (Signed)
Patient ID: Paige [name], female   DOB: 10-14-1950, 69 y.o.   MRN: [id number] Patient ID: Paige [name], female   DOB: 1951-01-01, 69 y.o.   MRN: [id number] [facility] MD Progress Note  03/14/2020 2:04 PM [name]   Pa time.st Medical History:   MRN:  [id number] Diagnosis major depression in remission  Chief complaint depressed mood state  At this time the patient is doing fairly well.  Her only complaint which is a constant complaint is that she is having trouble focusing and concentrating.  We have considered the possibility of attention deficit disorder but she is very equivalent about meeting the criteria.  Her mood is been doing great since she has been on the Effexor.  Lunesta helps her sleep.  She is eating well has good energy but her only issue is that she does not stay on task in her work setting.  Fortunately has not gotten her in trouble with her job.  The patient does say the people complain that she is not attending and concentrating.  The patient drinks no alcohol uses no drugs.  She has no psychosis.  Overall she is in good spirits.  She is somewhat introverted and quiet.  The patient denies chest pain shortness of breath or any neurological symptoms at this time. Past Medical History:  Diagnosis Date  . ADD (attention deficit disorder)    Pt has hard time focusing  . Anxiety   . Arthritis   . Carpal tunnel syndrome   . Colon polyp   . Constipation   . Depression   . Gallstones   . GERD (gastroesophageal reflux disease)   . Hypertension   . Status post dilation of esophageal narrowing   . Thyroid disease     Past Surgical History:  Procedure Laterality Date  . ABDOMINAL HYSTERECTOMY    . CARPAL TUNNEL RELEASE Left   . CHOLECYSTECTOMY    . CLOSED REDUCTION HAND FRACTURE     right hand  . fx leg    . NASAL SINUS SURGERY    . SHOULDER SURGERY     reattac tendon; right shoulder  . TIBIA FRACTURE SURGERY     steel rod right leg  . TUBAL LIGATION      Family History:  Family History  Problem Relation Age of Onset  . Hyperlipidemia Mother   . Colon cancer Mother        5  . Lung cancer Father   . Cancer Paternal Grandfather        mets  . Leukemia Maternal Aunt    Family Psychiatric  History:  Social History:  Social History   Substance and Sexual Activity  Alcohol Use No     Social History   Substance and Sexual Activity  Drug Use No    Social History   Socioeconomic History  . Marital status: Widowed    Spouse name: Not on file  . Number of children: 2  . Years of education: Not on file  . Highest education level: High school graduate  Occupational History  . Occupation: retired    Fish farm manager: UNEMPLOYED  . Occupation: Conservation officer, historic buildings: [employer]  Tobacco Use  . Smoking status: Never Smoker  . Smokeless tobacco: Never Used  Vaping Use  . Vaping Use: Never used  Substance and Sexual Activity  . Alcohol use: No  . Drug use: No  . Sexual activity: Never  Other Topics Concern  . Not on file  Social History Narrative   Works at [employer] - husband died of lung cancer      No regular exercise   Social Determinants of Health   Financial Resource Strain: Low Risk   . Difficulty of Paying Living Expenses: Not hard at all  Food Insecurity: No Food Insecurity  . Worried About Charity fundraiser in the Last Year: Never true  . Ran Out of Food in the Last Year: Never true  Transportation Needs: No Transportation Needs  . Lack of Transportation (Medical): No  . Lack of Transportation (Non-Medical): No  Physical Activity: Sufficiently Active  . Days of Exercise per Week: 5 days  . Minutes of Exercise per Session: 150+ min  Stress:   . Feeling of Stress :   Social Connections: Moderately Isolated  . Frequency of Communication with Friends and Family: More than three times a week  . Frequency of Social Gatherings with Friends and Family: Three times a week  .  Attends Religious Services: 1 to 4 times per year  . Active Member of Clubs or Organizations: No  . Attends Archivist Meetings: Never  . Marital Status: Widowed   Additional Social History:                         Sleep: Fair  Appetite:  Good  Current Medications: Current Outpatient Medications  Medication Sig Dispense Refill  . B Complex-C (SUPER B COMPLEX PO) Take by mouth.    . bisoprolol-hydrochlorothiazide (ZIAC) 5-6.25 MG tablet Take 1 tablet by mouth once daily 90 tablet 0  . buPROPion (WELLBUTRIN XL) 150 MG 24 hr tablet 1  qam 30 tablet 3  . Eszopiclone 3 MG TABS Take 1 tablet (3 mg total) by mouth at bedtime as needed. Take immediately before bedtime 30 tablet 5  . EUTHYROX 112 MCG tablet Take 1 tablet by mouth once daily 90 tablet 1  . venlafaxine XR (EFFEXOR-XR) 37.5 MG 24 hr capsule 2  qam  For 2 weeks  Then 1  qam  For  2weeks then DC 50 capsule 1   No current facility-administered medications for this visit.    Lab Results: No results found for this or any previous visit (from the past 48 hour(s)).  Blood Alcohol level:  No results found for: [facility]  Physical Findings: AIMS:  , ,  ,  ,    CIWA:    COWS:     Musculoskeletal: Strength & Muscle Tone: within normal limits Gait & Station: normal Patient leans: N/A  Psychiatric Specialty Exam: ROS  There were no vitals taken for this visit.There is no height or weight on file to calculate BMI.  General Appearance: Fairly Groomed  Engineer, water::  Good  Speech:  Clear and Coherent  Volume:  Normal  Mood:  Euthymic  Affect:  Blunt  Thought Process:  Coherent  Orientation:  Full (Time, Place, and Person)  Thought Content:  WDL  Suicidal Thoughts:  No  Homicidal Thoughts:  No  Memory:  NA  Judgement:  NA  Insight:  Good  Psychomotor Activity:  Decreased  Concentration:  Fair  Recall:  [place] of Knowledge:Good  Language: Fair  Akathisia:  No  Handed:  Right  AIMS (if indicated):      Assets:  Desire for Improvement  ADL's:  Intact  Cognition: WNL  Sleep:      Treatment Plan Summary: 03/14/2020,  2:04 PM  At this time the patient is doing fairly well.  She continues to have major depression.  At this time we are going to slowly taper off her Effexor over the next 1 month.  She will take 37.5 mg 2 in the morning for 2 weeks and then take 1 in the morning for 2 weeks and then discontinue it.  At the same time she is going to start her Wellbutrin at low-dose 150 mg XL.  She will return to see me in 2 months and at that time we will consider increasing her Wellbutrin to relatively high dose hopefully to have a positive effect on her focus and concentration.  Her second problem is that of insomnia which is well controlled with Lunesta.  The patient is not suicidal.  She is working full-time.

## 2020-04-04 DIAGNOSIS — C44612 Basal cell carcinoma of skin of right upper limb, including shoulder: Secondary | ICD-10-CM | POA: Diagnosis not present

## 2020-04-04 DIAGNOSIS — Z85828 Personal history of other malignant neoplasm of skin: Secondary | ICD-10-CM | POA: Diagnosis not present

## 2020-04-04 DIAGNOSIS — L720 Epidermal cyst: Secondary | ICD-10-CM | POA: Diagnosis not present

## 2020-04-04 DIAGNOSIS — L814 Other melanin hyperpigmentation: Secondary | ICD-10-CM | POA: Diagnosis not present

## 2020-04-04 DIAGNOSIS — L821 Other seborrheic keratosis: Secondary | ICD-10-CM | POA: Diagnosis not present

## 2020-04-04 DIAGNOSIS — C44619 Basal cell carcinoma of skin of left upper limb, including shoulder: Secondary | ICD-10-CM | POA: Diagnosis not present

## 2020-04-04 DIAGNOSIS — D1801 Hemangioma of skin and subcutaneous tissue: Secondary | ICD-10-CM | POA: Diagnosis not present

## 2020-04-04 DIAGNOSIS — D485 Neoplasm of uncertain behavior of skin: Secondary | ICD-10-CM | POA: Diagnosis not present

## 2020-04-28 ENCOUNTER — Other Ambulatory Visit: Payer: Self-pay | Admitting: Internal Medicine

## 2020-05-16 ENCOUNTER — Telehealth (INDEPENDENT_AMBULATORY_CARE_PROVIDER_SITE_OTHER): Payer: PPO | Admitting: Psychiatry

## 2020-05-16 ENCOUNTER — Other Ambulatory Visit: Payer: Self-pay

## 2020-05-16 ENCOUNTER — Telehealth (HOSPITAL_COMMUNITY): Payer: PPO | Admitting: Psychiatry

## 2020-05-16 DIAGNOSIS — F324 Major depressive disorder, single episode, in partial remission: Secondary | ICD-10-CM

## 2020-05-16 MED ORDER — ESZOPICLONE 3 MG PO TABS: 3.0000 mg | ORAL_TABLET | Freq: Every evening | ORAL | 5 refills | Status: DC | PRN

## 2020-05-16 MED ORDER — BUPROPION HCL ER (SR) 100 MG PO TB12: ORAL_TABLET | ORAL | 3 refills | Status: DC

## 2020-05-16 MED ORDER — BUPROPION HCL ER (SR) 100 MG PO TB12: 100.0000 mg | ORAL_TABLET | Freq: Two times a day (BID) | ORAL | 2 refills | Status: DC

## 2020-05-16 NOTE — Progress Notes (Signed)
Patient ID: Paige [name], female   DOB: [birth year], 69 y.o.   MRN: [id number] Patient ID: Paige [name], female   DOB: [birth year], 69 y.o.   MRN: [id number] [facility] MD Progress Note  05/16/2020 4:26 PM [name]   Pa time.st Medical History:   MRN:  [id number] Diagnosis major depressis  Today the patient is doing fairly well.  She says continues to Wellbutrin, however this change in her perspective of things.  Her feel like as if she is got humming in her head.  When she felt like she is been hit in the head and she was hearing a ringing.  I think also her sleep might be a little bit disturbed.  Patient says that she feels like she is having character change.  She now feels more problem with people she is more sure..  She feels like she is stepping out and being more direct and clear with people.  She does not really clear if she is concentrating any better but I suspect she is more focused in some way.  I do not believe that she is depressed.  Her appetite is normal.  So it is mainly a change in her perspective.  She is more assertive and direct.  I think the Wellbutrin has perhaps energized her in some way.  She still doing okay at work.  Chief complaint depressed mood stat  At this time the patient i Past Medical History:  Diagnosis Date  . ADD (attention deficit disorder)    Pt has hard time focusing  . Anxiety   . Arthritis   . Carpal tunnel syndrome   . Colon polyp   . Constipation   . Depression   . Gallstones   . GERD (gastroesophageal reflux disease)   . Hypertension   . Status post dilation of esophageal narrowing   . Thyroid disease     Past Surgical History:  Procedure Laterality Date  . ABDOMINAL HYSTERECTOMY    . CARPAL TUNNEL RELEASE Left   . CHOLECYSTECTOMY    . CLOSED REDUCTION HAND FRACTURE     right hand  . fx leg    . NASAL SINUS SURGERY    . SHOULDER SURGERY     reattac tendon; right shoulder  . TIBIA FRACTURE SURGERY     steel rod right leg   . TUBAL LIGATION     Family History:  Family History  Problem Relation Age of Onset  . Hyperlipidemia Mother   . Colon cancer Mother        55  . Lung cancer Father   . Cancer Paternal Grandfather        mets  . Leukemia Maternal Aunt    Family Psychiatric  History:  Social History:  Social History   Substance and Sexual Activity  Alcohol Use No     Social History   Substance and Sexual Activity  Drug Use No    Social History   Socioeconomic History  . Marital status: Widowed    Spouse name: Not on file  . Number of children: 2  . Years of education: Not on file  . Highest education level: High school graduate  Occupational History  . Occupation: retired    Fish farm manager: UNEMPLOYED  . Occupation: Conservation officer, historic buildings: [employer]  Tobacco Use  . Smoking status: Never Smoker  . Smokeless tobacco: Never Used  Vaping Use  . Vaping Use: Never used  Substance and Sexual Activity  .  Alcohol use: No  . Drug use: No  . Sexual activity: Never  Other Topics Concern  . Not on file  Social History Narrative   Works at [employer] - husband died of lung cancer      No regular exercise   Social Determinants of Health   Financial Resource Strain: Low Risk   . Difficulty of Paying Living Expenses: Not hard at all  Food Insecurity: No Food Insecurity  . Worried About Charity fundraiser in the Last Year: Never true  . Ran Out of Food in the Last Year: Never true  Transportation Needs: No Transportation Needs  . Lack of Transportation (Medical): No  . Lack of Transportation (Non-Medical): No  Physical Activity: Sufficiently Active  . Days of Exercise per Week: 5 days  . Minutes of Exercise per Session: 150+ min  Stress:   . Feeling of Stress : Not on file  Social Connections: Moderately Isolated  . Frequency of Communication with Friends and Family: More than three times a week  . Frequency of Social Gatherings with Friends  and Family: Three times a week  . Attends Religious Services: 1 to 4 times per year  . Active Member of Clubs or Organizations: No  . Attends Archivist Meetings: Never  . Marital Status: Widowed   Additional Social History:                         Sleep: Fair  Appetite:  Good  Current Medications: Current Outpatient Medications  Medication Sig Dispense Refill  . B Complex-C (SUPER B COMPLEX PO) Take by mouth.    . bisoprolol-hydrochlorothiazide (ZIAC) 5-6.25 MG tablet Take 1 tablet by mouth daily. Annual appt due in Nov must see provider for future refills 90 tablet 0  . buPROPion (WELLBUTRIN SR) 100 MG 12 hr tablet 1  qam 30 tablet 3  . Eszopiclone 3 MG TABS Take 1 tablet (3 mg total) by mouth at bedtime as needed. Take immediately before bedtime 30 tablet 5  . EUTHYROX 112 MCG tablet Take 1 tablet by mouth once daily 90 tablet 1  . venlafaxine XR (EFFEXOR-XR) 37.5 MG 24 hr capsule 2  qam  For 2 weeks  Then 1  qam  For  2weeks then DC 50 capsule 1   No current facility-administered medications for this visit.    Lab Results: No results found for this or any previous visit (from the past 48 hour(s)).  Blood Alcohol level:  No results found for: [facility]  Physical Findings: AIMS:  , ,  ,  ,    CIWA:    COWS:     Musculoskeletal: Strength & Muscle Tone: within normal limits Gait & Station: normal Patient leans: N/A  Psychiatric Specialty Exam: ROS  There were no vitals taken for this visit.There is no height or weight on file to calculate BMI.  General Appearance: Fairly Groomed  Engineer, water::  Good  Speech:  Clear and Coherent  Volume:  Normal  Mood:  Euthymic  Affect:  Blunt  Thought Process:  Coherent  Orientation:  Full (Time, Place, and Person)  Thought Content:  WDL  Suicidal Thoughts:  No  Homicidal Thoughts:  No  Memory:  NA  Judgement:  NA  Insight:  Good  Psychomotor Activity:  Decreased  Concentration:  Fair  Recall:  [place] of  Knowledge:Good  Language: Fair  Akathisia:  No  Handed:  Right  AIMS (if indicated):     Assets:  Desire for Improvement  ADL's:  Intact  Cognition: WNL  Sleep:      Treatment Plan Summary: 05/16/2020, 4:26 PM  At this time the patient this patient has 2 problems.  First is that of major depression.  She will continue taking Wellbutrin but we will in fact reduce the dose from 150 XL down to a much lower dose of 100 mg slow release that she will take in the morning.  Her second problem is that of insomnia and she will continue taking Lunesta.  She will return to see me in 2-1/2 months.  The patient does not describe any other side effects from the Wellbutrin other than it seems to make her more energized and assertive.

## 2020-06-06 ENCOUNTER — Other Ambulatory Visit (HOSPITAL_COMMUNITY): Payer: Self-pay | Admitting: Psychiatry

## 2020-06-07 ENCOUNTER — Other Ambulatory Visit (HOSPITAL_COMMUNITY): Payer: Self-pay

## 2020-06-07 MED ORDER — ESCITALOPRAM OXALATE 10 MG PO TABS: 10.0000 mg | ORAL_TABLET | Freq: Every morning | ORAL | 1 refills | Status: DC

## 2020-06-07 NOTE — Progress Notes (Unsigned)
lexapro

## 2020-06-11 ENCOUNTER — Ambulatory Visit: Payer: PPO

## 2020-06-16 ENCOUNTER — Ambulatory Visit (INDEPENDENT_AMBULATORY_CARE_PROVIDER_SITE_OTHER): Payer: PPO

## 2020-06-16 DIAGNOSIS — Z23 Encounter for immunization: Secondary | ICD-10-CM | POA: Diagnosis not present

## 2020-06-28 IMAGING — CT CT CERVICAL SPINE WITHOUT CONTRAST
2 of 11 series · 7 of 33 positions shown, 8 images · non-contrast
Comparison: None.

CLINICAL DATA: Tripped on curb, facial laceration

EXAM:
CT HEAD WITHOUT CONTRAST
CT MAXILLOFACIAL WITHOUT CONTRAST
CT CERVICAL SPINE WITHOUT CONTRAST
TECHNIQUE: Multidetector CT imaging of the head, cervical spine, and
maxillofacial structures were performed using the standard protocol
without intravenous contrast. Multiplanar CT image reconstructions
of the cervical spine and maxillofacial structures were also
generated.

[Series 15: facialbone 2.0 sag st · sagittal · 0.35mm/px · 4 of 100 slices shown]
[im 20/100  bone]
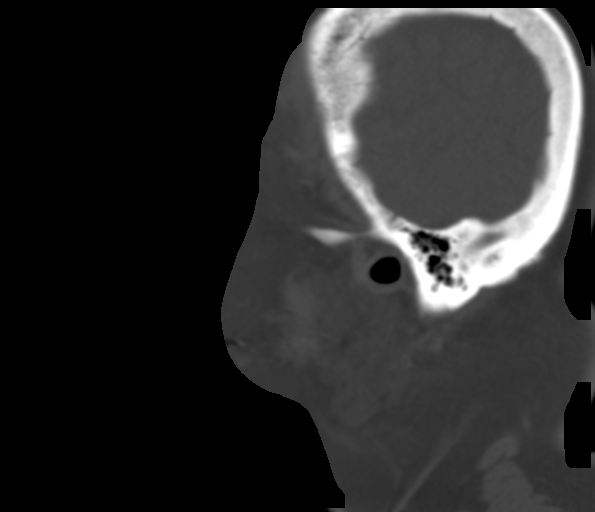
[im 40/100  bone]
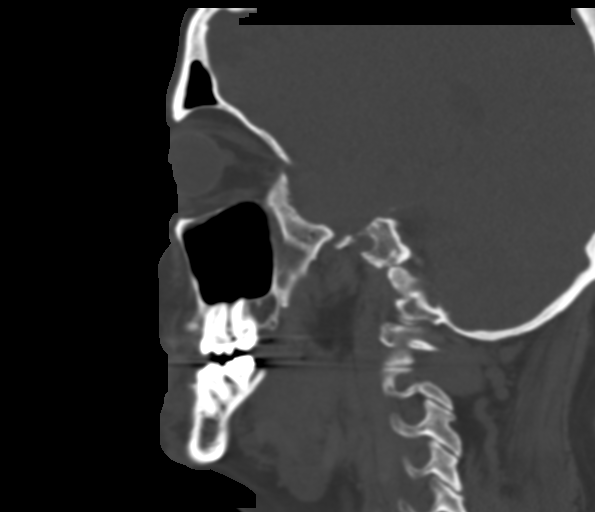
[im 60/100  bone]
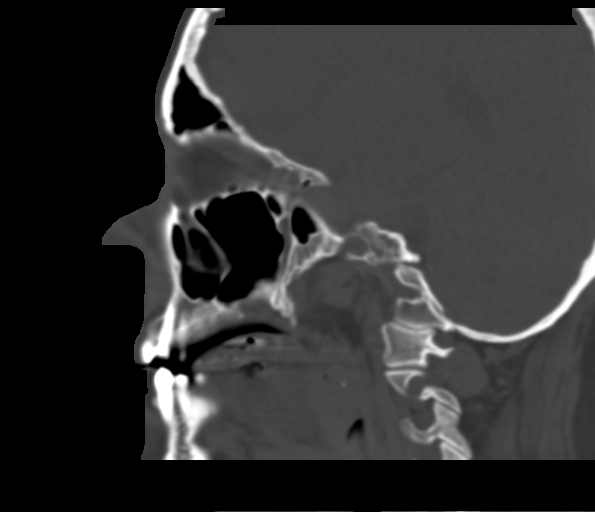
[im 80/100  bone]
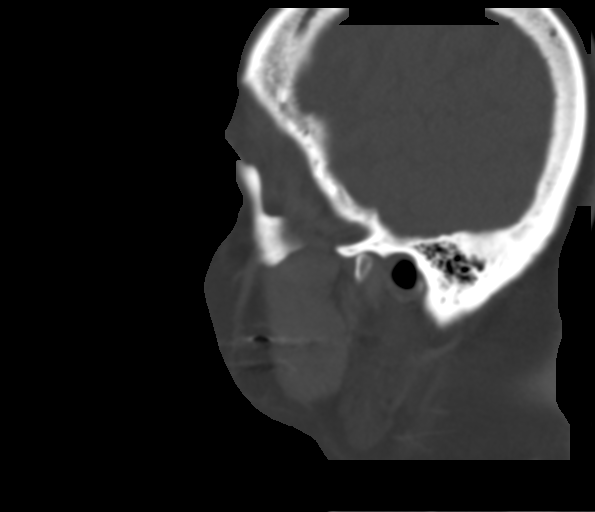

[Series 16: c_spine 2.0 st · axial · 0.25mm/px · z∈[+1041,+1235]mm · 3 of 98 slices shown, 4 images]
[im 1/98  soft-tissue]
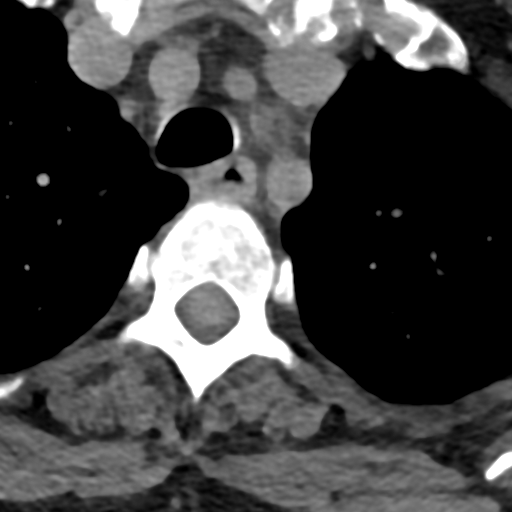
[im 1/98  bone]
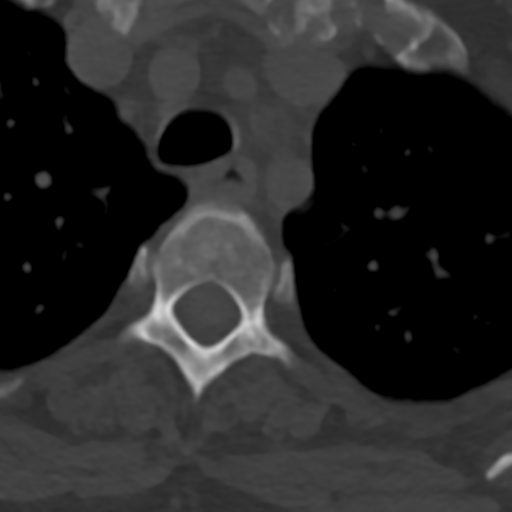
[im 49/98  bone]
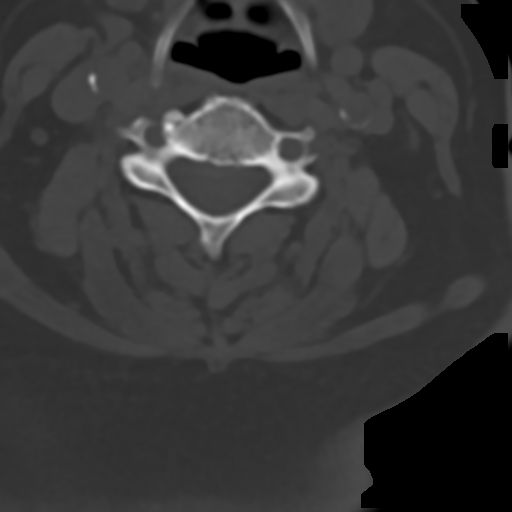
[im 98/98  bone]
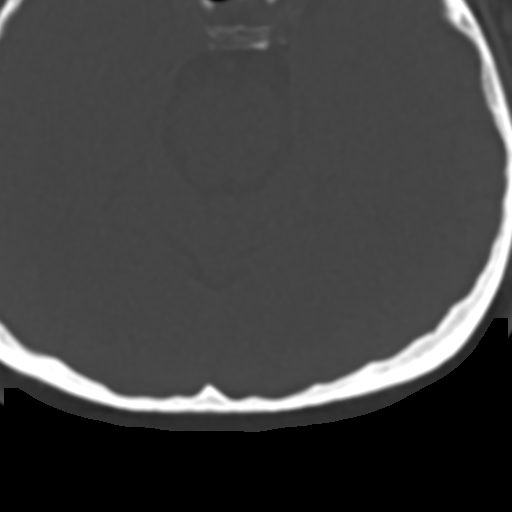

[7 of 33 positions shown; findings below may reference images not displayed]

FINDINGS: CT HEAD FINDINGS

Brain: No evidence of acute infarction, hemorrhage, hydrocephalus,
extra-axial collection or mass lesion/mass effect.

Vascular: No hyperdense vessel or unexpected calcification.

CT FACIAL BONES FINDINGS

Skull: Normal. Negative for fracture or focal lesion.

Facial bones: Mildly displaced fractures of the bilateral nasal
bones. No other displaced fractures or dislocations.

Sinuses/Orbits: No acute finding.

Other: None.

CT CERVICAL SPINE FINDINGS

Alignment: Straightening of the normal cervical lordosis.

Skull base and vertebrae: No acute fracture. No primary bone lesion
or focal pathologic process.

Soft tissues and spinal canal: No prevertebral fluid or swelling. No
visible canal hematoma.

Disc levels: Mild multilevel disc space height loss and
osteophytosis.

Upper chest: Negative.

Other: None.
IMPRESSION: 1.  No acute intracranial pathology.

2. Mildly displaced fractures of the bilateral nasal bones. No other
displaced fractures or dislocations of the facial bones.

3.  No fracture or static subluxation of the cervical spine.

## 2020-06-28 IMAGING — DX CHEST - 2 VIEW
2 series · 2 of 2 positions shown · non-contrast
Comparison: None.

CLINICAL DATA: 67-year-old female with chest pain

EXAM:
CHEST - 2 VIEW

[w chest pa]
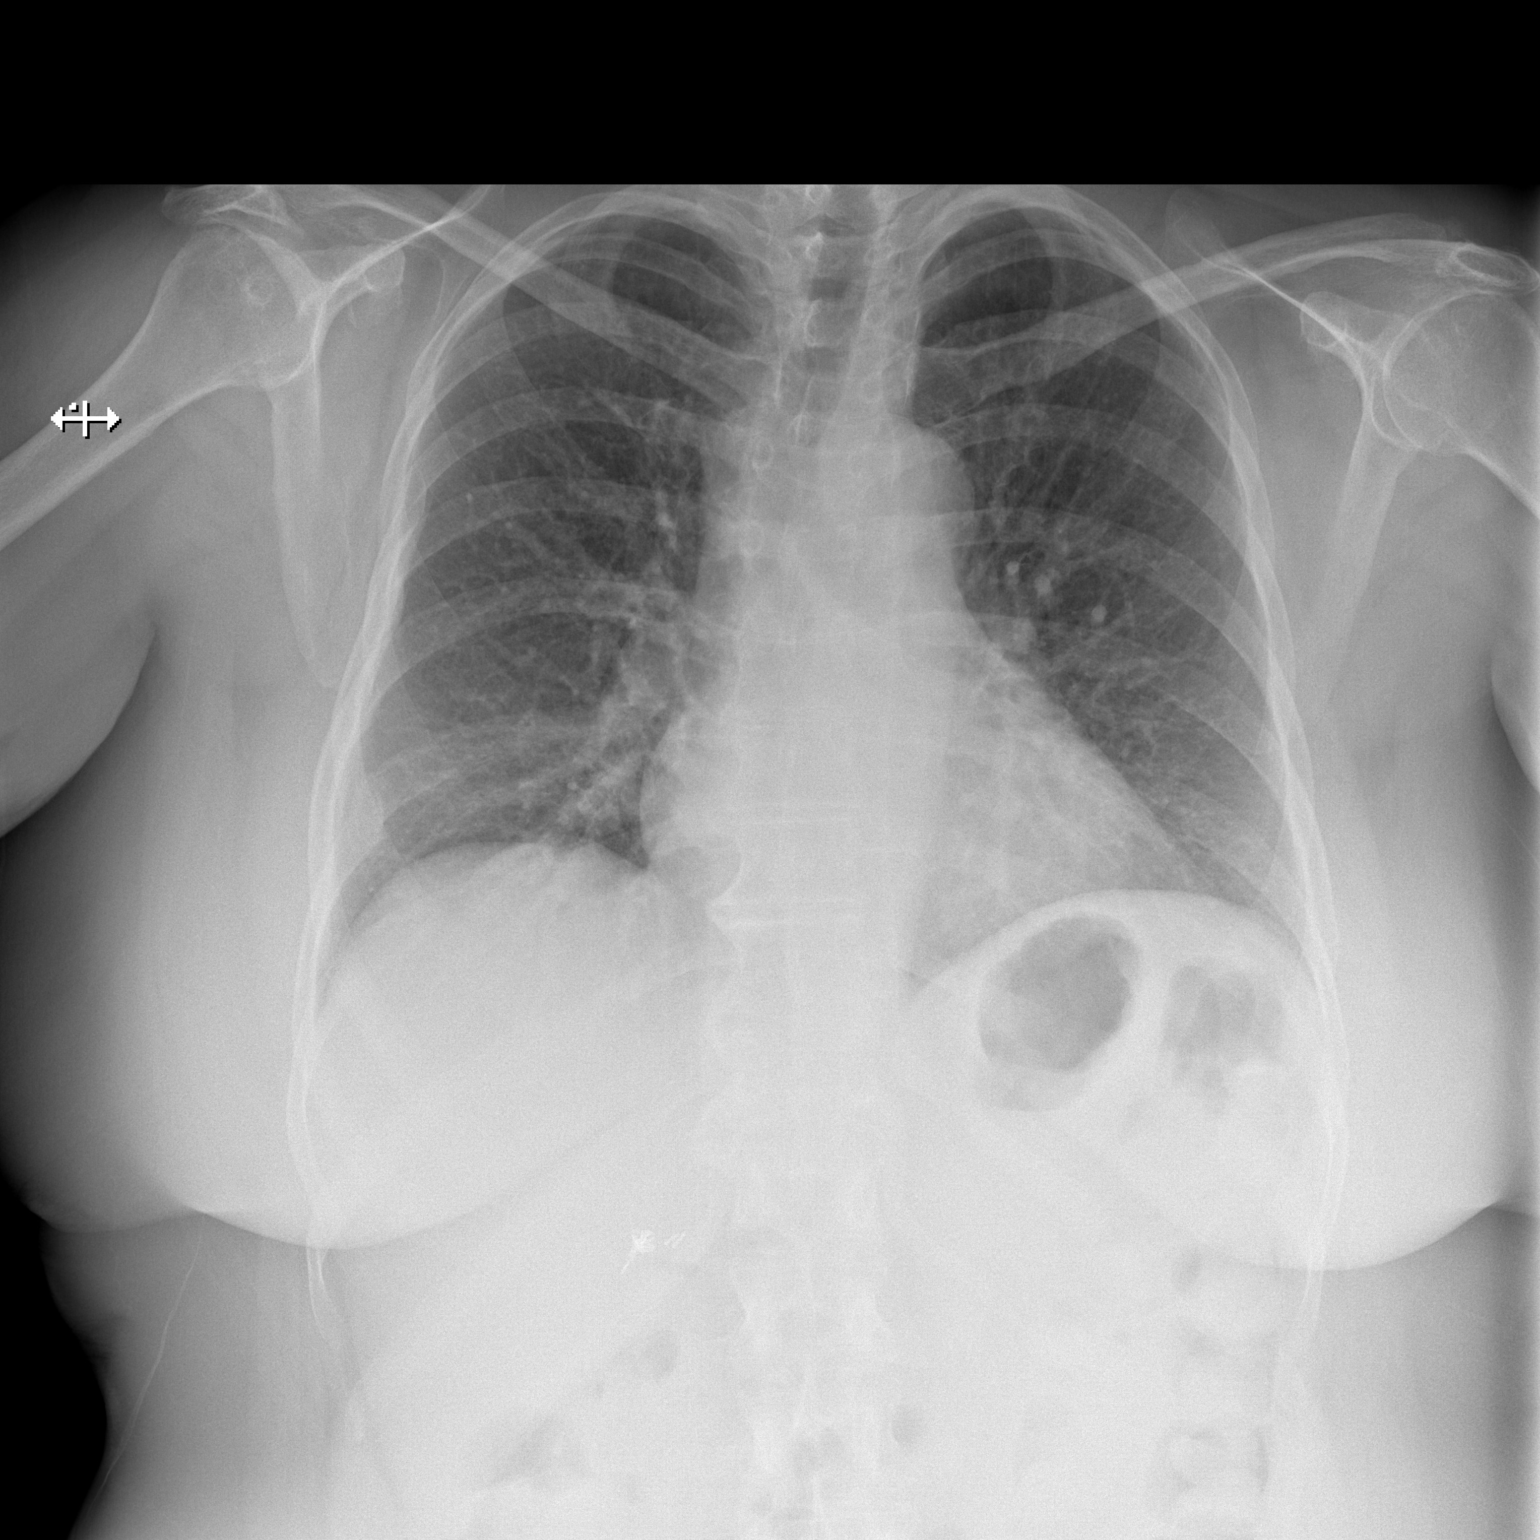

[w chest lat]
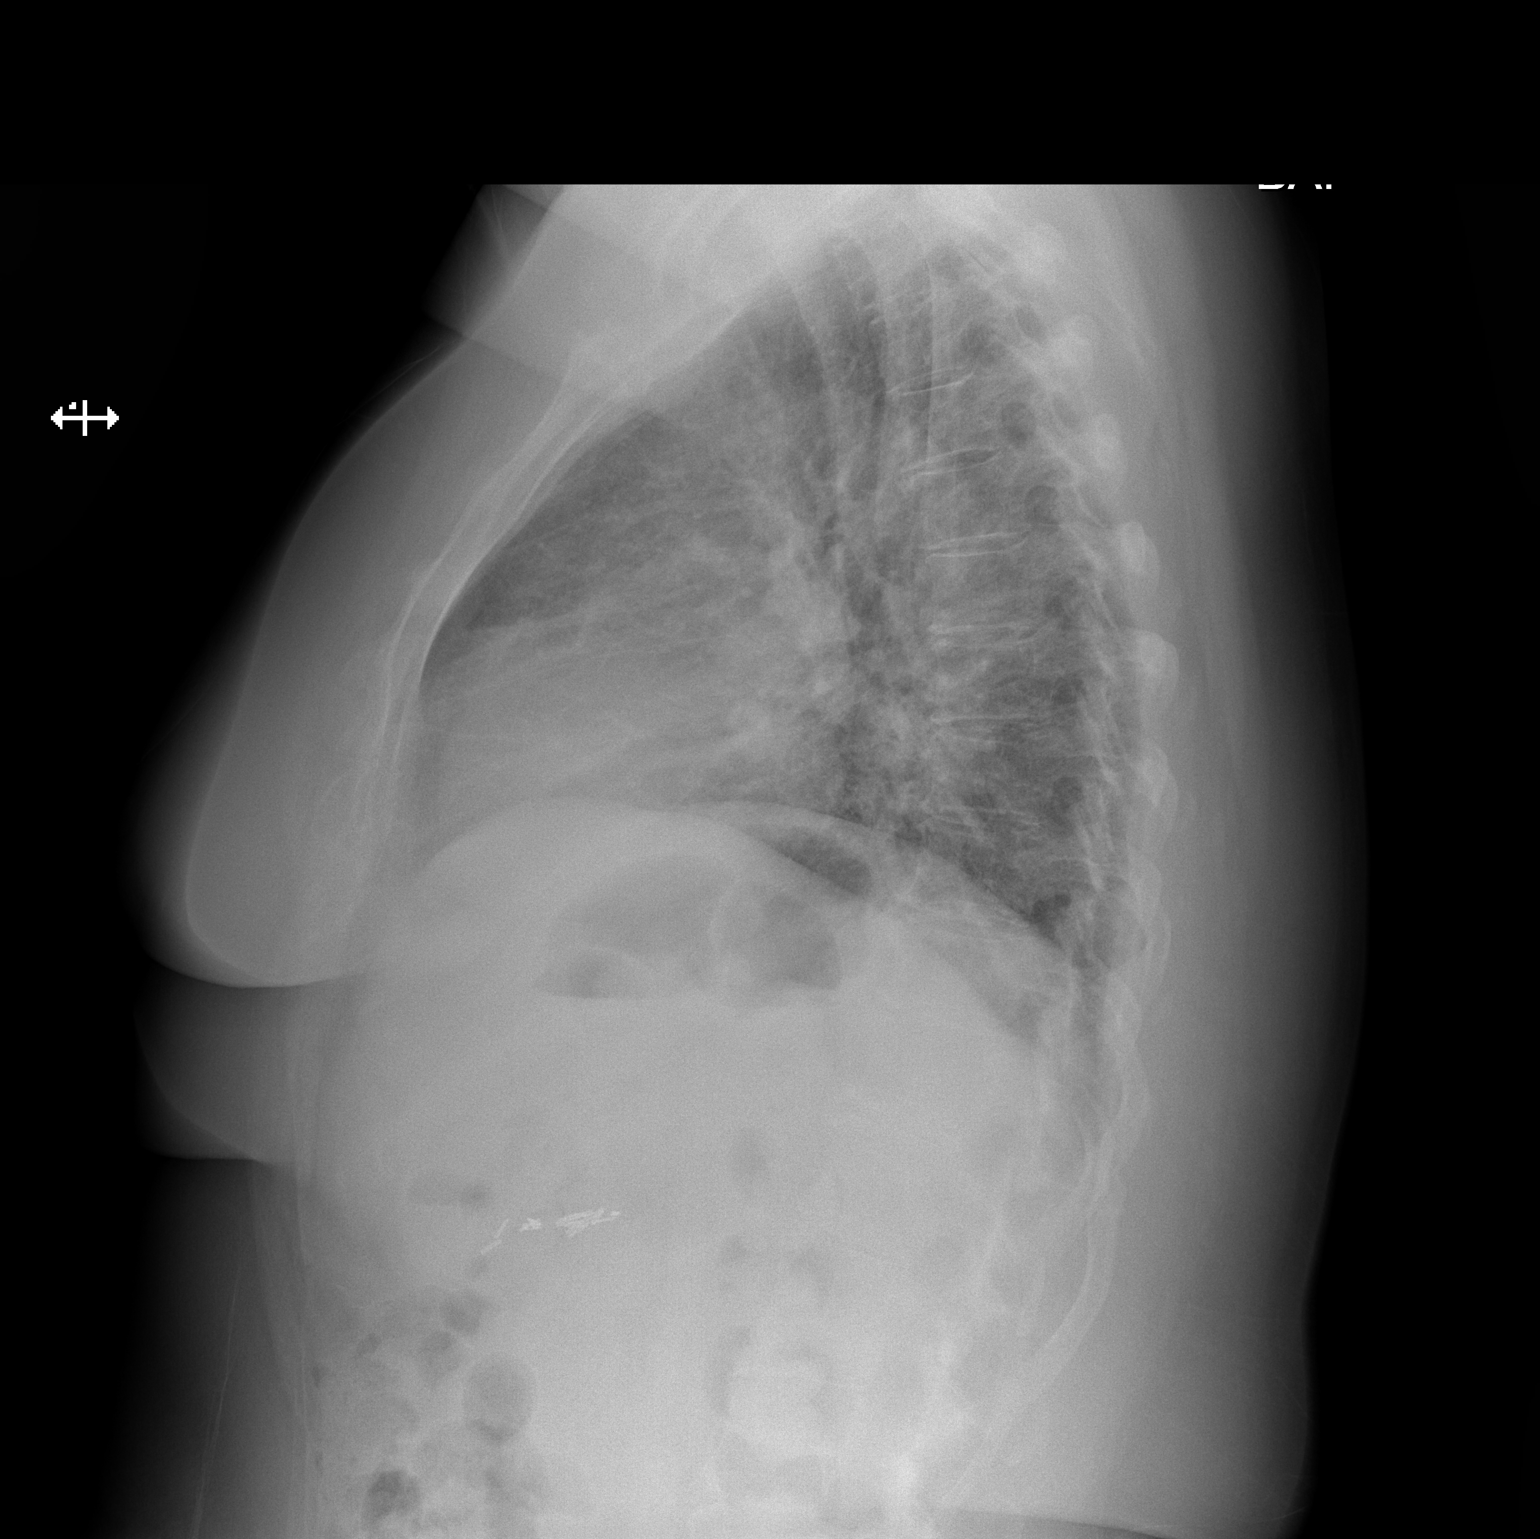

[2 of 2 positions shown; findings below may reference images not displayed]

FINDINGS: Cardiomediastinal silhouette within normal limits in size and
contour. No evidence of central vascular congestion or interlobular
septal thickening. No pneumothorax. No pleural effusion. No
confluent airspace disease.

No acute displaced fracture. Degenerative changes of the spine.
Posttraumatic deformity of right chest wall.
IMPRESSION: Low lung volumes with likely atelectasis and chronic changes with no
evidence of acute cardiopulmonary disease.

## 2020-07-02 DIAGNOSIS — M25511 Pain in right shoulder: Secondary | ICD-10-CM | POA: Diagnosis not present

## 2020-07-02 DIAGNOSIS — M25512 Pain in left shoulder: Secondary | ICD-10-CM | POA: Diagnosis not present

## 2020-07-02 DIAGNOSIS — G5601 Carpal tunnel syndrome, right upper limb: Secondary | ICD-10-CM | POA: Diagnosis not present

## 2020-07-02 DIAGNOSIS — M79641 Pain in right hand: Secondary | ICD-10-CM | POA: Diagnosis not present

## 2020-07-02 DIAGNOSIS — M25519 Pain in unspecified shoulder: Secondary | ICD-10-CM | POA: Diagnosis not present

## 2020-07-17 ENCOUNTER — Encounter: Payer: PPO | Admitting: Internal Medicine

## 2020-07-22 NOTE — Patient Instructions (Addendum)
Blood work was ordered.  Have it done at the [facility] lab.    No immunization administered today.   Consider getting the shingles vaccine at your pharmacy.    Medications changes include :   none  Your prescription(s) have been submitted to your pharmacy.    A referral was ordered for the [facility].     Someone will call you to schedule an appointment.    Please followup in 6 months    Health Maintenance, Female Adopting a healthy lifestyle and getting preventive care are important in promoting health and wellness. Ask your health care provider about:  The right schedule for you to have regular tests and exams.  Things you can do on your own to prevent diseases and keep yourself healthy. What should I know about diet, weight, and exercise? Eat a healthy diet   Eat a diet that includes plenty of vegetables, fruits, low-fat dairy products, and lean protein.  Do not eat a lot of foods that are high in solid fats, added sugars, or sodium. Maintain a healthy weight Body mass index (BMI) is used to identify weight problems. It estimates body fat based on height and weight. Your health care provider can help determine your BMI and help you achieve or maintain a healthy weight. Get regular exercise Get regular exercise. This is one of the most important things you can do for your health. Most adults should:  Exercise for at least 150 minutes each week. The exercise should increase your heart rate and make you sweat (moderate-intensity exercise).  Do strengthening exercises at least twice a week. This is in addition to the moderate-intensity exercise.  Spend less time sitting. Even light physical activity can be beneficial. Watch cholesterol and blood lipids Have your blood tested for lipids and cholesterol at 70 years of age, then have this test every 5 years. Have your cholesterol levels checked more often if:  Your lipid or cholesterol levels are high.  You are older  than 70 years of age.  You are at high risk for heart disease. What should I know about cancer screening? Depending on your health history and family history, you may need to have cancer screening at various ages. This may include screening for:  Breast cancer.  Cervical cancer.  Colorectal cancer.  Skin cancer.  Lung cancer. What should I know about heart disease, diabetes, and high blood pressure? Blood pressure and heart disease  High blood pressure causes heart disease and increases the risk of stroke. This is more likely to develop in people who have high blood pressure readings, are of African descent, or are overweight.  Have your blood pressure checked: ? Every 3-5 years if you are 77-55 years of age. ? Every year if you are 74 years old or older. Diabetes Have regular diabetes screenings. This checks your fasting blood sugar level. Have the screening done:  Once every three years after age 65 if you are at a normal weight and have a low risk for diabetes.  More often and at a younger age if you are overweight or have a high risk for diabetes. What should I know about preventing infection? Hepatitis B If you have a higher risk for hepatitis B, you should be screened for this virus. Talk with your health care provider to find out if you are at risk for hepatitis B infection. Hepatitis C Testing is recommended for:  Everyone born from [birth year] through 1966.  Anyone with known risk factors for  hepatitis C. Sexually transmitted infections (STIs)  Get screened for STIs, including gonorrhea and chlamydia, if: ? You are sexually active and are younger than 70 years of age. ? You are older than 70 years of age and your health care provider tells you that you are at risk for this type of infection. ? Your sexual activity has changed since you were last screened, and you are at increased risk for chlamydia or gonorrhea. Ask your health care provider if you are at risk.  Ask  your health care provider about whether you are at high risk for HIV. Your health care provider may recommend a prescription medicine to help prevent HIV infection. If you choose to take medicine to prevent HIV, you should first get tested for HIV. You should then be tested every 3 months for as long as you are taking the medicine. Pregnancy  If you are about to stop having your period (premenopausal) and you may become pregnant, seek counseling before you get pregnant.  Take 400 to 800 micrograms (mcg) of folic acid every day if you become pregnant.  Ask for birth control (contraception) if you want to prevent pregnancy. Osteoporosis and menopause Osteoporosis is a disease in which the bones lose minerals and strength with aging. This can result in bone fractures. If you are 83 years old or older, or if you are at risk for osteoporosis and fractures, ask your health care provider if you should:  Be screened for bone loss.  Take a calcium or vitamin D supplement to lower your risk of fractures.  Be given hormone replacement therapy (HRT) to treat symptoms of menopause. Follow these instructions at home: Lifestyle  Do not use any products that contain nicotine or tobacco, such as cigarettes, e-cigarettes, and chewing tobacco. If you need help quitting, ask your health care provider.  Do not use street drugs.  Do not share needles.  Ask your health care provider for help if you need support or information about quitting drugs. Alcohol use  Do not drink alcohol if: ? Your health care provider tells you not to drink. ? You are pregnant, may be pregnant, or are planning to become pregnant.  If you drink alcohol: ? Limit how much you use to 0-1 drink a day. ? Limit intake if you are breastfeeding.  Be aware of how much alcohol is in your drink. In the [place] one drink equals one 12 oz bottle of beer (355 mL), one 5 oz glass of wine (148 mL), or one 1 oz glass of hard liquor (44  mL). General instructions  Schedule regular health, dental, and eye exams.  Stay current with your vaccines.  Tell your health care provider if: ? You often feel depressed. ? You have ever been abused or do not feel safe at home. Summary  Adopting a healthy lifestyle and getting preventive care are important in promoting health and wellness.  Follow your health care provider's instructions about healthy diet, exercising, and getting tested or screened for diseases.  Follow your health care provider's instructions on monitoring your cholesterol and blood pressure. This information is not intended to replace advice given to you by your health care provider. Make sure you discuss any questions you have with your health care provider. Document Revised: 07/28/2018 Document Reviewed: 07/28/2018 Elsevier Patient Education  2020 [employer].

## 2020-07-22 NOTE — Progress Notes (Signed)
Subjective:    Patient ID: Paige [name], female    DOB: 11-08-1950, 69 y.o.   MRN: [id number]   This visit occurred during the SARS-CoV-2 public health emergency.  Safety protocols were in place, including screening questions prior to the visit, additional usage of staff PPE, and extensive cleaning of exam room while observing appropriate contact time as indicated for disinfecting solutions.    HPI She is here for a physical exam.   She denies changes in her health.    Medications and allergies reviewed with patient and updated if appropriate.  Patient Active Problem List   Diagnosis Date Noted  . Deviated septum 02/21/2019  . Chest wall pain 02/14/2019  . Right shoulder pain 02/14/2019  . Nasal fracture 02/13/2019  . Prediabetes 07/05/2018  . Popping sound of knee joint 07/05/2018  . Chronic bilateral low back pain without sciatica 07/30/2017  . Hyperlipidemia 07/04/2017  . Piriformis syndrome of left side 04/29/2016  . Left hip pain 03/27/2016  . Lumbar radiculopathy 03/19/2016  . Neck pain on right side 01/16/2016  . AK (actinic keratosis) 10/19/2012  . CARCINOMA, BASAL CELL, BACK 11/16/2009  . ANXIETY DISORDER, GENERALIZED 07/30/2009  . BACK PAIN WITH RADICULOPATHY 05/28/2009  . Unspecified thrombosed hemorrhoids 10/17/2008  . OSTEOARTHRITIS 12/20/2007  . Hypothyroidism 06/08/2007  . Attention deficit hyperactivity disorder (ADHD) 06/08/2007  . Carpal tunnel syndrome 06/08/2007  . Essential hypertension 06/08/2007    Current Outpatient Medications on File Prior to Visit  Medication Sig Dispense Refill  . B Complex-C (SUPER B COMPLEX PO) Take by mouth.    . escitalopram (LEXAPRO) 10 MG tablet Take 1 tablet (10 mg total) by mouth every morning. 30 tablet 1  . Eszopiclone 3 MG TABS Take 1 tablet (3 mg total) by mouth at bedtime as needed. Take immediately before bedtime 30 tablet 5   No current facility-administered medications on file prior to visit.     Past Medical History:  Diagnosis Date  . ADD (attention deficit disorder)    Pt has hard time focusing  . Anxiety   . Arthritis   . Carpal tunnel syndrome   . Colon polyp   . Constipation   . Depression   . Gallstones   . GERD (gastroesophageal reflux disease)   . Hypertension   . Status post dilation of esophageal narrowing   . Thyroid disease     Past Surgical History:  Procedure Laterality Date  . ABDOMINAL HYSTERECTOMY    . CARPAL TUNNEL RELEASE Left   . CHOLECYSTECTOMY    . CLOSED REDUCTION HAND FRACTURE     right hand  . fx leg    . NASAL SINUS SURGERY    . SHOULDER SURGERY     reattac tendon; right shoulder  . TIBIA FRACTURE SURGERY     steel rod right leg  . TUBAL LIGATION      Social History   Socioeconomic History  . Marital status: Widowed    Spouse name: Not on file  . Number of children: 2  . Years of education: Not on file  . Highest education level: High school graduate  Occupational History  . Occupation: retired    Fish farm manager: UNEMPLOYED  . Occupation: Conservation officer, historic buildings: [employer]  Tobacco Use  . Smoking status: Never Smoker  . Smokeless tobacco: Never Used  Vaping Use  . Vaping Use: Never used  Substance and Sexual Activity  . Alcohol use: No  . Drug use: No  .  Sexual activity: Never  Other Topics Concern  . Not on file  Social History Narrative   Works at [employer] - husband died of lung cancer      No regular exercise   Social Determinants of Health   Financial Resource Strain: Low Risk   . Difficulty of Paying Living Expenses: Not hard at all  Food Insecurity: No Food Insecurity  . Worried About Charity fundraiser in the Last Year: Never true  . Ran Out of Food in the Last Year: Never true  Transportation Needs: No Transportation Needs  . Lack of Transportation (Medical): No  . Lack of Transportation (Non-Medical): No  Physical Activity: Sufficiently Active  . Days of  Exercise per Week: 5 days  . Minutes of Exercise per Session: 150+ min  Stress:   . Feeling of Stress : Not on file  Social Connections: Moderately Isolated  . Frequency of Communication with Friends and Family: More than three times a week  . Frequency of Social Gatherings with Friends and Family: Three times a week  . Attends Religious Services: 1 to 4 times per year  . Active Member of Clubs or Organizations: No  . Attends Archivist Meetings: Never  . Marital Status: Widowed    Family History  Problem Relation Age of Onset  . Hyperlipidemia Mother   . Colon cancer Mother        85  . Lung cancer Father   . Cancer Paternal Grandfather        mets  . Leukemia Maternal Aunt     Review of Systems  Constitutional: Negative for chills and fever.  Eyes: Negative for visual disturbance.  Respiratory: Negative for cough, shortness of breath and wheezing.   Cardiovascular: Negative for chest pain, palpitations and leg swelling.  Gastrointestinal: Negative for abdominal pain, blood in stool, constipation, diarrhea and nausea.       Occ gerd  Genitourinary: Positive for frequency. Negative for dysuria and hematuria.  Musculoskeletal: Positive for neck pain. Negative for back pain.  Skin: Negative for color change and rash.  Neurological: Positive for headaches (occ). Negative for light-headedness.  Psychiatric/Behavioral: Positive for dysphoric mood. The patient is nervous/anxious.        Objective:   Vitals:   07/23/20 1626  BP: 128/70  Pulse: 73  Temp: 98.4 F (36.9 C)  SpO2: 96%   Filed Weights   07/23/20 1626  Weight: 199 lb (90.3 kg)   Body mass index is 36.4 kg/m.  BP Readings from Last 3 Encounters:  07/23/20 128/70  01/09/20 124/70  07/12/19 122/84    Wt Readings from Last 3 Encounters:  07/23/20 199 lb (90.3 kg)  01/09/20 206 lb (93.4 kg)  07/12/19 206 lb 12.8 oz (93.8 kg)     Physical Exam Constitutional: She appears well-developed and  well-nourished. No distress.  HENT:  Head: Normocephalic and atraumatic.  Right Ear: External ear normal. Normal ear canal and TM Left Ear: External ear normal.  Normal ear canal and TM Mouth/Throat: Oropharynx is clear and moist.  Eyes: Conjunctivae and EOM are normal.  Neck: Neck supple. No tracheal deviation present. No thyromegaly present.  No carotid bruit  Cardiovascular: Normal rate, regular rhythm and normal heart sounds.   No murmur heard.  No edema. Pulmonary/Chest: Effort normal and breath sounds normal. No respiratory distress. She has no wheezes. She has no rales.  Breast: deferred   Abdominal: Soft. She exhibits no  distension. There is no tenderness.  Lymphadenopathy: She has no cervical adenopathy.  Skin: Skin is warm and dry. She is not diaphoretic.  Psychiatric: She has a normal mood and affect. Her behavior is normal.        Assessment & Plan:   Physical exam: Screening blood work    ordered Immunizations  Discussed shingrix, others up to date Colonoscopy  Up to date  Mammogram  Up to date  Gyn  Not currently seeing one - but would like to - feels her bladder may have dropped - frequent urination Dexa  Due - normal 5 years ago Eye exams  Up to date  Exercise  none Weight  Advised weight loss Substance abuse  none      See Problem List for Assessment and Plan of chronic medical problems.

## 2020-07-23 ENCOUNTER — Encounter: Payer: Self-pay | Admitting: Internal Medicine

## 2020-07-23 ENCOUNTER — Other Ambulatory Visit: Payer: Self-pay

## 2020-07-23 ENCOUNTER — Ambulatory Visit (INDEPENDENT_AMBULATORY_CARE_PROVIDER_SITE_OTHER): Payer: PPO | Admitting: Internal Medicine

## 2020-07-23 VITALS — BP 128/70 | HR 73 | Temp 98.4°F | Ht 62.0 in | Wt 199.0 lb

## 2020-07-23 DIAGNOSIS — F3289 Other specified depressive episodes: Secondary | ICD-10-CM | POA: Diagnosis not present

## 2020-07-23 DIAGNOSIS — E782 Mixed hyperlipidemia: Secondary | ICD-10-CM | POA: Diagnosis not present

## 2020-07-23 DIAGNOSIS — Z0000 Encounter for general adult medical examination without abnormal findings: Secondary | ICD-10-CM

## 2020-07-23 DIAGNOSIS — F411 Generalized anxiety disorder: Secondary | ICD-10-CM | POA: Diagnosis not present

## 2020-07-23 DIAGNOSIS — R350 Frequency of micturition: Secondary | ICD-10-CM

## 2020-07-23 DIAGNOSIS — Z13820 Encounter for screening for osteoporosis: Secondary | ICD-10-CM | POA: Diagnosis not present

## 2020-07-23 DIAGNOSIS — I10 Essential (primary) hypertension: Secondary | ICD-10-CM | POA: Diagnosis not present

## 2020-07-23 DIAGNOSIS — E039 Hypothyroidism, unspecified: Secondary | ICD-10-CM

## 2020-07-23 DIAGNOSIS — R7303 Prediabetes: Secondary | ICD-10-CM

## 2020-07-23 MED ORDER — BISOPROLOL-HYDROCHLOROTHIAZIDE 5-6.25 MG PO TABS: 1.0000 | ORAL_TABLET | Freq: Every day | ORAL | 0 refills | Status: DC

## 2020-07-23 MED ORDER — LEVOTHYROXINE SODIUM 112 MCG PO TABS: 112.0000 ug | ORAL_TABLET | Freq: Every day | ORAL | 1 refills | Status: DC

## 2020-07-23 NOTE — Assessment & Plan Note (Signed)
Chronic Managed by Dr Casimiro Needle

## 2020-07-23 NOTE — Assessment & Plan Note (Signed)
Chronic Check lipid panel  Deferred statin - lifestyle controlled Regular exercise and healthy diet encouraged

## 2020-07-23 NOTE — Assessment & Plan Note (Signed)
Chronic Check a1c Low sugar / carb diet Stressed regular exercise  

## 2020-07-23 NOTE — Assessment & Plan Note (Signed)
Chronic BP well controlled Continue lisinopril 5-6.25 mg daily cmp

## 2020-07-23 NOTE — Assessment & Plan Note (Signed)
Chronic  Clinically euthyroid Currently taking levothyroxine 112 mcg Check tsh  Titrate med dose if needed

## 2020-08-02 NOTE — Progress Notes (Deleted)
[facility] New Patient Evaluation and Consultation  Referring Provider: Binnie Rail, MD PCP: Binnie Rail, MD Date of Service: 08/08/2020  SUBJECTIVE Chief Complaint: No chief complaint on file.  History of Present Illness: SHIYA FOGELMAN is a 69 y.o. {ED SANE [id number] female seen in consultation at the request of Dr. Quay Burow for evaluation of prolapse.    Review of records from Dr Quay Burow significant for: Patient feels that bladder may have dropped. Having frequent urination.   Urinary Symptoms: {urine leakage?:24754} Leaks *** time(s) per {days/wks/mos/yrs:310907}.  Pad use: {NUMBERS 1-10:18281} {pad option:24752} per day.   She {ACTION; IS/IS YQI:34742595} bothered by her UI symptoms.  Day time voids ***.  Nocturia: *** times per night to void. Voiding dysfunction: she {empties:24755} her bladder well.  {DOES NOT does:27190::"does not"} use a catheter to empty bladder.  When urinating, she feels {urine symptoms:24756} Drinks: *** per day  UTIs: {NUMBERS 1-10:18281} UTI's in the last year.   {ACTIONS;DENIES/REPORTS:21021675::"Denies"} history of {urologic concerns:24757}  Pelvic Organ Prolapse Symptoms:                  She {denies/ admits to:24761} a feeling of a bulge the vaginal area. It has been present for {NUMBER 1-10:22536} {days/wks/mos/yrs:310907}.  She {denies/ admits to:24761} seeing a bulge.  This bulge {ACTION; IS/IS GLO:75643329} bothersome.  Bowel Symptom: Bowel movements: *** time(s) per {Time; day/week/month:13537} Stool consistency: {stool consistency:24758} Straining: {yes/no:19897}.  Splinting: {yes/no:19897}.  Incomplete evacuation: {yes/no:19897}.  She {denies/ admits to:24761} accidental bowel leakage / fecal incontinence  Occurs: *** time(s) per {Time; day/week/month:13537}  Consistency with leakage: {stool consistency:24758} Bowel regimen: {bowel regimen:24759} Last colonoscopy: Date ***, Results ***  Sexual  Function Sexually active: {yes/no:19897}.  Sexual orientation: {Sexual Orientation:9368682624} Pain with sex: {pain with sex:24762}  Pelvic Pain {denies/ admits to:24761} pelvic pain Location: *** Pain occurs: *** Prior pain treatment: *** Improved by: *** Worsened by: ***   Past Medical History:  Past Medical History:  Diagnosis Date  . ADD (attention deficit disorder)    Pt has hard time focusing  . Anxiety   . Arthritis   . Carpal tunnel syndrome   . Colon polyp   . Constipation   . Depression   . Gallstones   . GERD (gastroesophageal reflux disease)   . Hypertension   . Status post dilation of esophageal narrowing   . Thyroid disease      Past Surgical History:   Past Surgical History:  Procedure Laterality Date  . ABDOMINAL HYSTERECTOMY    . CARPAL TUNNEL RELEASE Left   . CHOLECYSTECTOMY    . CLOSED REDUCTION HAND FRACTURE     right hand  . fx leg    . NASAL SINUS SURGERY    . SHOULDER SURGERY     reattac tendon; right shoulder  . TIBIA FRACTURE SURGERY     steel rod right leg  . TUBAL LIGATION       Past OB/GYN History: G{NUMBERS 1-10:18281} P{NUMBERS 1-10:18281} Vaginal deliveries: ***,  Forceps/ Vacuum deliveries: ***, Cesarean section: *** Menopausal: {menopausal:24763} Contraception: ***. Last pap smear was ***.  Any history of abnormal pap smears: {yes/no:19897}.   Medications: She has a current medication list which includes the following prescription(s): b complex-c, bisoprolol-hydrochlorothiazide, escitalopram, eszopiclone, and levothyroxine.   Allergies: Patient is allergic to asa [aspirin], augmentin [amoxicillin-pot clavulanate], and morphine and related.   Social History:  Social History   Tobacco Use  . Smoking status: Never Smoker  . Smokeless tobacco: Never Used  Vaping  Use  . Vaping Use: Never used  Substance Use Topics  . Alcohol use: No  . Drug use: No    Relationship status: {relationship status:24764} She lives  with ***.   She {ACTION; IS/IS OVF:64332951} employed ***. Regular exercise: {Yes/No:304960894} History of abuse: {Yes/No:304960894}  Family History:   Family History  Problem Relation Age of Onset  . Hyperlipidemia Mother   . Colon cancer Mother        52  . Lung cancer Father   . Cancer Paternal Grandfather        mets  . Leukemia Maternal Aunt      Review of Systems: ROS   OBJECTIVE Physical Exam: There were no vitals filed for this visit.  Physical Exam   GU / Detailed Urogynecologic Evaluation:  Pelvic Exam: Normal external female genitalia; Bartholin's and Skene's glands normal in appearance; urethral meatus normal in appearance, no urethral masses or discharge.   CST: {gen negative/positive:315881}  Reflexes: bulbocavernosis {DESC; PRESENT/NOT PRESENT:21021351}, anocutaneous {DESC; PRESENT/NOT PRESENT:21021351} ***bilaterally.  Speculum exam reveals normal vaginal mucosa {With/Without:20273} atrophy. Cervix {exam; gyn cervix:30847}. Uterus {exam; pelvic uterus:30849}. Adnexa {exam; adnexa:12223}.    s/p hysterectomy: Speculum exam reveals normal vaginal mucosa {With/Without:20273}  atrophy and normal vaginal cuff.  Adnexa {exam; adnexa:12223}.    With apex supported, anterior compartment defect was {reduced:24765}  Pelvic floor strength {Roman # I-V:19040}/V, puborectalis {Roman # I-V:19040}/V external anal sphincter {Roman # I-V:19040}/V  Pelvic floor musculature: Right levator {Tender/Non-tender:20250}, Right obturator {Tender/Non-tender:20250}, Left levator {Tender/Non-tender:20250}, Left obturator {Tender/Non-tender:20250}  POP-Q:   POP-Q                                               Aa                                               Ba                                                 C                                                Gh                                               Pb                                               tvl                                                 Ap  Bp                                                 D     Rectal Exam:  Normal sphincter tone, {rectocele:24766} distal rectocele, enterocoele {DESC; PRESENT/NOT PRESENT:21021351}, no rectal masses, {sign of:24767} dyssynergia when asking the patient to bear down.  Post-Void Residual (PVR) by Bladder Scan: In order to evaluate bladder emptying, we discussed obtaining a postvoid residual and she agreed to this procedure.  Procedure: The ultrasound unit was placed on the patient's abdomen in the suprapubic region after the patient had voided. A PVR of *** ml was obtained by bladder scan.  Laboratory Results: @ENCLABS @   ***I visualized the urine specimen, noting the specimen to be {urine color:24768}  ASSESSMENT AND PLAN Ms. Guin is a 69 y.o. with: No diagnosis found.    Jaquita Folds, MD   Medical Decision Making:  - Reviewed/ ordered a clinical laboratory test - Reviewed/ ordered a radiologic study - Reviewed/ ordered medicine test - Decision to obtain old records - Discussion of management of or test interpretation with an external physician / other healthcare professional  - Assessment requiring independent historian - Review and summation of prior records - Independent review of image, tracing or specimen

## 2020-08-03 ENCOUNTER — Other Ambulatory Visit (HOSPITAL_COMMUNITY): Payer: Self-pay | Admitting: Psychiatry

## 2020-08-08 ENCOUNTER — Ambulatory Visit (INDEPENDENT_AMBULATORY_CARE_PROVIDER_SITE_OTHER): Payer: PPO | Admitting: Psychiatry

## 2020-08-08 ENCOUNTER — Other Ambulatory Visit: Payer: Self-pay

## 2020-08-08 ENCOUNTER — Ambulatory Visit: Payer: PPO | Admitting: Obstetrics and Gynecology

## 2020-08-08 DIAGNOSIS — F325 Major depressive disorder, single episode, in full remission: Secondary | ICD-10-CM

## 2020-08-08 MED ORDER — ESCITALOPRAM OXALATE 10 MG PO TABS
ORAL_TABLET | ORAL | 1 refills | Status: DC
Start: 2020-08-08 — End: 2020-08-24

## 2020-08-08 NOTE — Progress Notes (Signed)
Patient ID: Paige [name], female   DOB: Jun 17, 1951, 69 y.o.   MRN: [id number] Patient ID: Paige [name], female   DOB: 1950/11/02, 69 y.o.   MRN: [id number] [facility] MD Progress Note  08/08/2020 3:49 PM [name]   Pa time.st Medical History:   MRN:  [id number] Diagnosis major depressis  Today the patient is at her baseline. We attempted to discontinue her her Effexor which we did start her on Wellbutrin. But apparently she responded to Wellbutrin bright becoming very anxious and agitated. We therefore stopped the Wellbutrin and put her on Lexapro 10 mg. Overall she is better than she was. Generally she is sleeping and eating well. She is considering retiring next month. Work is no different than usual. He seems to have been accepted at work and she does the best she can. She is not in any trouble. She drinks no alcohol uses no drugs is never demonstrated any psychosis. Physically she is fairly stable. She denies being persistently depressed. She is a very sedentary lifestyle. The patient continues to function. She was a few years left her house and she does room her. She is try to get out little but more her and restaurants and been more activated.  Chief complaint depressed mood stat  At this time the patient i Past Medical History:  Diagnosis Date  . ADD (attention deficit disorder)    Pt has hard time focusing  . Anxiety   . Arthritis   . Carpal tunnel syndrome   . Colon polyp   . Constipation   . Depression   . Gallstones   . GERD (gastroesophageal reflux disease)   . Hypertension   . Status post dilation of esophageal narrowing   . Thyroid disease     Past Surgical History:  Procedure Laterality Date  . ABDOMINAL HYSTERECTOMY    . CARPAL TUNNEL RELEASE Left   . CHOLECYSTECTOMY    . CLOSED REDUCTION HAND FRACTURE     right hand  . fx leg    . NASAL SINUS SURGERY    . SHOULDER SURGERY     reattac tendon; right shoulder  . TIBIA FRACTURE SURGERY     steel rod  right leg  . TUBAL LIGATION     Family History:  Family History  Problem Relation Age of Onset  . Hyperlipidemia Mother   . Colon cancer Mother        26  . Lung cancer Father   . Cancer Paternal Grandfather        mets  . Leukemia Maternal Aunt    Family Psychiatric  History:  Social History:  Social History   Substance and Sexual Activity  Alcohol Use No     Social History   Substance and Sexual Activity  Drug Use No    Social History   Socioeconomic History  . Marital status: Widowed    Spouse name: Not on file  . Number of children: 2  . Years of education: Not on file  . Highest education level: High school graduate  Occupational History  . Occupation: retired    Fish farm manager: UNEMPLOYED  . Occupation: Conservation officer, historic buildings: [employer]  Tobacco Use  . Smoking status: Never Smoker  . Smokeless tobacco: Never Used  Vaping Use  . Vaping Use: Never used  Substance and Sexual Activity  . Alcohol use: No  . Drug use: No  . Sexual activity: Never  Other Topics Concern  . Not on file  Social History Narrative   Works at [employer] - husband died of lung cancer      No regular exercise   Social Determinants of Health   Financial Resource Strain: Low Risk   . Difficulty of Paying Living Expenses: Not hard at all  Food Insecurity: No Food Insecurity  . Worried About Charity fundraiser in the Last Year: Never true  . Ran Out of Food in the Last Year: Never true  Transportation Needs: No Transportation Needs  . Lack of Transportation (Medical): No  . Lack of Transportation (Non-Medical): No  Physical Activity: Sufficiently Active  . Days of Exercise per Week: 5 days  . Minutes of Exercise per Session: 150+ min  Stress: Not on file  Social Connections: Moderately Isolated  . Frequency of Communication with Friends and Family: More than three times a week  . Frequency of Social Gatherings with Friends and Family: Three  times a week  . Attends Religious Services: 1 to 4 times per year  . Active Member of Clubs or Organizations: No  . Attends Archivist Meetings: Never  . Marital Status: Widowed   Additional Social History:                         Sleep: Fair  Appetite:  Good  Current Medications: Current Outpatient Medications  Medication Sig Dispense Refill  . B Complex-C (SUPER B COMPLEX PO) Take by mouth.    . bisoprolol-hydrochlorothiazide (ZIAC) 5-6.25 MG tablet Take 1 tablet by mouth daily. Annual appt due in Nov must see provider for future refills 90 tablet 0  . escitalopram (LEXAPRO) 10 MG tablet 1  qam 90 tablet 1  . Eszopiclone 3 MG TABS Take 1 tablet (3 mg total) by mouth at bedtime as needed. Take immediately before bedtime 30 tablet 5  . levothyroxine (EUTHYROX) 112 MCG tablet Take 1 tablet (112 mcg total) by mouth daily. 90 tablet 1   No current facility-administered medications for this visit.    Lab Results: No results found for this or any previous visit (from the past 48 hour(s)).  Blood Alcohol level:  No results found for: [facility]  Physical Findings: AIMS:  , ,  ,  ,    CIWA:    COWS:     Musculoskeletal: Strength & Muscle Tone: within normal limits Gait & Station: normal Patient leans: N/A  Psychiatric Specialty Exam: ROS  There were no vitals taken for this visit.There is no height or weight on file to calculate BMI.  General Appearance: Fairly Groomed  Engineer, water::  Good  Speech:  Clear and Coherent  Volume:  Normal  Mood:  Euthymic  Affect:  Blunt  Thought Process:  Coherent  Orientation:  Full (Time, Place, and Person)  Thought Content:  WDL  Suicidal Thoughts:  No  Homicidal Thoughts:  No  Memory:  NA  Judgement:  NA  Insight:  Good  Psychomotor Activity:  Decreased  Concentration:  Fair  Recall:  [employer] of Knowledge:Good  Language: Fair  Akathisia:  No  Handed:  Right  AIMS (if indicated):     Assets:  Desire for  Improvement  ADL's:  Intact  Cognition: WNL  Sleep:      Treatment Plan Summary: 08/08/2020, 3:49 PM  Fairly there was some confusion about her medications. Nonetheless she has a history of major depression at this time she'll continue taking his  10 mg of Lexapro. She's sleeping fairly well. Once again there is little clear evidence that she is an attention disorder. I suspect she's been cognitively limited. Nonetheless she is able to continue to work and live independently. Wellbutrin apparently made her too agitated. Effexor was not helpful at all. She continue the Lexapro at the 10 mg dose and we'll reevaluate her in 4 months. She is handling the pandemic pretty well.

## 2020-08-09 DIAGNOSIS — M25562 Pain in left knee: Secondary | ICD-10-CM | POA: Diagnosis not present

## 2020-08-09 HISTORY — DX: Pain in left knee: M25.562

## 2020-08-13 DIAGNOSIS — M7542 Impingement syndrome of left shoulder: Secondary | ICD-10-CM | POA: Diagnosis not present

## 2020-08-13 DIAGNOSIS — M25562 Pain in left knee: Secondary | ICD-10-CM | POA: Diagnosis not present

## 2020-08-16 ENCOUNTER — Other Ambulatory Visit (INDEPENDENT_AMBULATORY_CARE_PROVIDER_SITE_OTHER): Payer: PPO

## 2020-08-16 DIAGNOSIS — I10 Essential (primary) hypertension: Secondary | ICD-10-CM

## 2020-08-16 DIAGNOSIS — Z0000 Encounter for general adult medical examination without abnormal findings: Secondary | ICD-10-CM

## 2020-08-16 DIAGNOSIS — E039 Hypothyroidism, unspecified: Secondary | ICD-10-CM | POA: Diagnosis not present

## 2020-08-16 DIAGNOSIS — R7303 Prediabetes: Secondary | ICD-10-CM | POA: Diagnosis not present

## 2020-08-16 DIAGNOSIS — E782 Mixed hyperlipidemia: Secondary | ICD-10-CM

## 2020-08-16 LAB — CBC WITH DIFFERENTIAL/PLATELET
Basophils Absolute: 0.1 10*3/uL (ref 0.0–0.1)
Basophils Relative: 0.9 % (ref 0.0–3.0)
Eosinophils Absolute: 0.3 10*3/uL (ref 0.0–0.7)
Eosinophils Relative: 3.5 % (ref 0.0–5.0)
HCT: 36.5 % (ref 36.0–46.0)
Hemoglobin: 12.4 g/dL (ref 12.0–15.0)
Lymphocytes Relative: 32.5 % (ref 12.0–46.0)
Lymphs Abs: 2.5 10*3/uL (ref 0.7–4.0)
MCHC: 33.8 g/dL (ref 30.0–36.0)
MCV: 81.3 fl (ref 78.0–100.0)
Monocytes Absolute: 0.5 10*3/uL (ref 0.1–1.0)
Monocytes Relative: 6.8 % (ref 3.0–12.0)
Neutro Abs: 4.4 10*3/uL (ref 1.4–7.7)
Neutrophils Relative %: 56.3 % (ref 43.0–77.0)
Platelets: 254 10*3/uL (ref 150.0–400.0)
RBC: 4.49 Mil/uL (ref 3.87–5.11)
RDW: 13.8 % (ref 11.5–15.5)
WBC: 7.8 10*3/uL (ref 4.0–10.5)

## 2020-08-16 LAB — COMPREHENSIVE METABOLIC PANEL
ALT: 9 U/L (ref 0–35)
AST: 16 U/L (ref 0–37)
Albumin: 4.1 g/dL (ref 3.5–5.2)
Alkaline Phosphatase: 65 U/L (ref 39–117)
BUN: 23 mg/dL (ref 6–23)
CO2: 32 mEq/L (ref 19–32)
Calcium: 9.2 mg/dL (ref 8.4–10.5)
Chloride: 103 mEq/L (ref 96–112)
Creatinine, Ser: 0.68 mg/dL (ref 0.40–1.20)
GFR: 88.21 mL/min (ref >60.00)
Glucose, Bld: 110 mg/dL — ABNORMAL HIGH (ref 70–99)
Potassium: 3.7 mEq/L (ref 3.5–5.1)
Sodium: 141 mEq/L (ref 135–145)
Total Bilirubin: 0.4 mg/dL (ref 0.2–1.2)
Total Protein: 7 g/dL (ref 6.0–8.3)

## 2020-08-16 LAB — LIPID PANEL
Cholesterol: 187 mg/dL (ref 0–200)
HDL: 48.3 mg/dL (ref >39.00)
LDL Cholesterol: 104 mg/dL — ABNORMAL HIGH (ref 0–99)
NonHDL: 138.82
Total CHOL/HDL Ratio: 4
Triglycerides: 175 mg/dL — ABNORMAL HIGH (ref 0.0–149.0)
VLDL: 35 mg/dL (ref 0.0–40.0)

## 2020-08-16 LAB — HEMOGLOBIN A1C: Hgb A1c MFr Bld: 6.4 % (ref 4.6–6.5)

## 2020-08-16 LAB — TSH: TSH: 0.9 u[IU]/mL (ref 0.35–4.50)

## 2020-08-21 NOTE — Progress Notes (Signed)
[facility] New Patient Evaluation and Consultation  Referring Provider: Pincus Sanes, MD PCP: Pincus Sanes, MD Date of Service: 08/24/2020  SUBJECTIVE Chief Complaint: New Patient (Initial Visit) and Urinary Frequency  History of Present Illness: Paige [name] is a 71 y.o. White or Caucasian female seen in consultation at the request of Dr. Lawerance Bach for evaluation of urinary frequency.    Review of records from Dr Lawerance Bach significant for: Reports of urinary frequency. Denies dysuria or hematuria symptoms.  Urinary Symptoms: Leaks urine with going from sitting to standing, with movement to the bathroom, with urgency and continuously Leaks 6 time(s) per day.  Pad use: none She is bothered by her UI symptoms.  Day time voids: sometimes every few hours, up to every hour.  Nocturia: 3-4 times per night to void. Takes a BP medication at night.  Voiding dysfunction: she empties her bladder well.  does not use a catheter to empty bladder.  When urinating, she feels she has no difficulties Drinks: about 16oz water, sometimes diet green tea or diet coke per day Just drinks a sip of water with pills at bedtime.   UTIs: 0 UTI's in the last year.   Denies history of blood in urine and kidney or bladder stones  Pelvic Organ Prolapse Symptoms:                  She Admits to sometimes feeling of a bulge the vaginal area. This bulge is not bothersome.  Bowel Symptom: Bowel movements: sometimes two time(s) per day Stool consistency: soft  Straining: yes.  Splinting: no.  Incomplete evacuation: no.  She Admits to accidental bowel leakage / fecal incontinence  Occurs: only one time recently Bowel regimen: none Last colonoscopy: Date- about 2 years ago, Results: negative  Sexual Function Sexually active: no.   Pelvic Pain Denies pelvic pain   Past Medical History:  Past Medical History:  Diagnosis Date  . ADD (attention deficit disorder)    Pt has hard time  focusing  . Anxiety   . Arthritis   . Carpal tunnel syndrome   . Colon polyp   . Constipation   . Depression   . Gallstones   . GERD (gastroesophageal reflux disease)   . Hypertension   . Status post dilation of esophageal narrowing   . Thyroid disease      Past Surgical History:   Past Surgical History:  Procedure Laterality Date  . ABDOMINAL HYSTERECTOMY    . CARPAL TUNNEL RELEASE Left   . CHOLECYSTECTOMY    . CLOSED REDUCTION HAND FRACTURE     right hand  . fx leg    . NASAL SINUS SURGERY    . SHOULDER SURGERY     reattac tendon; right shoulder  . TIBIA FRACTURE SURGERY     steel rod right leg  . TUBAL LIGATION       Past OB/GYN History: G3 P3 Vaginal deliveries: 3,  Forceps/ Vacuum deliveries: 0, Cesarean section: 0 Menopausal: Yes Contraception: n/a. Last pap smear was prior to hysterectomy   Medications: She has a current medication list which includes the following prescription(s): b complex-c, bisoprolol-hydrochlorothiazide, escitalopram, eszopiclone, levothyroxine, and mirabegron er.   Allergies: Patient is allergic to asa [aspirin], augmentin [amoxicillin-pot clavulanate], and morphine and related.   Social History:  Social History   Tobacco Use  . Smoking status: Never Smoker  . Smokeless tobacco: Never Used  Vaping Use  . Vaping Use: Never used  Substance Use Topics  .  Alcohol use: No  . Drug use: No    Relationship status: widowed She lives with: self.   She is employed in [employer] at [employer]. Regular exercise: sometimes History of abuse: No  Family History:   Family History  Problem Relation Age of Onset  . Hyperlipidemia Mother   . Colon cancer Mother        28  . Lung cancer Father   . Cancer Paternal Grandfather        mets  . Leukemia Maternal Aunt      Review of Systems: Review of Systems  Constitutional: Negative for fever, malaise/fatigue and weight loss.  Respiratory: Negative for cough,  shortness of breath and wheezing.   Cardiovascular: Negative for chest pain, palpitations and leg swelling.  Gastrointestinal: Negative for abdominal pain and blood in stool.  Genitourinary: Negative for dysuria.  Musculoskeletal: Negative for myalgias.  Skin: Negative for rash.  Neurological: Negative for dizziness and headaches.  Endo/Heme/Allergies: Does not bruise/bleed easily.  Psychiatric/Behavioral: Negative for depression. The patient is not nervous/anxious.      OBJECTIVE Physical Exam: Vitals:   08/24/20 1524  BP: 128/75  Pulse: 66  Height: 5\' 2"  (1.575 m)    Physical Exam Constitutional:      General: She is not in acute distress. Pulmonary:     Effort: Pulmonary effort is normal.  Abdominal:     General: There is no distension.     Palpations: Abdomen is soft.     Tenderness: There is no abdominal tenderness. There is no rebound.  Musculoskeletal:        General: No swelling. Normal range of motion.  Skin:    General: Skin is warm and dry.     Findings: No rash.  Neurological:     Mental Status: She is alert and oriented to person, place, and time.  Psychiatric:        Mood and Affect: Mood normal.        Behavior: Behavior normal.     GU / Detailed Urogynecologic Evaluation:  Pelvic Exam: Normal external female genitalia; Bartholin's and Skene's glands normal in appearance; urethral meatus normal in appearance, no urethral masses or discharge.   CST: negative   s/p hysterectomy: Speculum exam reveals normal vaginal mucosa with  atrophy and normal vaginal cuff.  Adnexa no mass, fullness, tenderness.    Pelvic floor strength I/V  Pelvic floor musculature: Right levator non-tender, Right obturator non-tender, Left levator non-tender, Left obturator non-tender  POP-Q:   POP-Q  -2                                            Aa   -2                                           Ba  -8                                              C   2.5  Gh  3                                            Pb  9                                            tvl   -1.5                                            Ap  -1.5                                            Bp                                                 D     Rectal Exam:  Normal external rectum  Post-Void Residual (PVR) by Bladder Scan: In order to evaluate bladder emptying, we discussed obtaining a postvoid residual and she agreed to this procedure.  Procedure: The ultrasound unit was placed on the patient's abdomen in the suprapubic region after the patient had voided. A PVR of 21 ml was obtained by bladder scan.  Laboratory Results: POC urine: negative  I visualized the urine specimen, noting the specimen to be dark yellow  ASSESSMENT AND PLAN Ms. Keck is a 70 y.o. with:  1. Overactive bladder   2. Urinary frequency    1. OAB  We discussed the symptoms of overactive bladder (OAB), which include urinary urgency, urinary frequency, nocturia, with or without urge incontinence.  While we do not know the exact etiology of OAB, several treatment options exist. We discussed management including behavioral therapy (decreasing bladder irritants, urge suppression strategies, timed voids, bladder retraining), physical therapy, medication; for refractory cases posterior tibial nerve stimulation, sacral neuromodulation, and intravesi For Beta-3 agonist medication, we discussed the potential side effect of elevated blood pressure which is more likely to occur in individuals with uncontrolled hypertension. - She would like to start medication- prescribed Myrbetriq 25mg  daily - She will work on increasing water intake and decreasing bladder irritants, list provided.  - She is also potentially interested in pelvic floor physical therapy but is not sure she is able to with her work schedule. [facility] handout provided about pelvic floor exercises and bladder  training.   2 Frequency -POC urine negative for infection  Return 6 weeks for medication follow up   Jaquita Folds, MD   Medical Decision Making:  - Reviewed/ ordered a clinical laboratory test - Review and summation of prior records

## 2020-08-22 ENCOUNTER — Other Ambulatory Visit (HOSPITAL_COMMUNITY): Payer: Self-pay | Admitting: Psychiatry

## 2020-08-22 ENCOUNTER — Other Ambulatory Visit (HOSPITAL_COMMUNITY): Payer: Self-pay | Admitting: *Deleted

## 2020-08-22 MED ORDER — ESCITALOPRAM OXALATE 20 MG PO TABS: 20.0000 mg | ORAL_TABLET | Freq: Every day | ORAL | 2 refills | Status: DC

## 2020-08-24 ENCOUNTER — Ambulatory Visit (INDEPENDENT_AMBULATORY_CARE_PROVIDER_SITE_OTHER): Payer: PPO | Admitting: Obstetrics and Gynecology

## 2020-08-24 ENCOUNTER — Other Ambulatory Visit: Payer: Self-pay

## 2020-08-24 ENCOUNTER — Encounter: Payer: Self-pay | Admitting: Obstetrics and Gynecology

## 2020-08-24 VITALS — BP 128/75 | HR 66 | Ht 62.0 in

## 2020-08-24 DIAGNOSIS — R350 Frequency of micturition: Secondary | ICD-10-CM | POA: Diagnosis not present

## 2020-08-24 DIAGNOSIS — N3281 Overactive bladder: Secondary | ICD-10-CM

## 2020-08-24 LAB — POCT URINALYSIS DIPSTICK
Appearance: NORMAL
Bilirubin, UA: NEGATIVE
Blood, UA: NEGATIVE
Glucose, UA: NEGATIVE
Ketones, UA: NEGATIVE
Leukocytes, UA: NEGATIVE
Nitrite, UA: NEGATIVE
Protein, UA: NEGATIVE
Urobilinogen, UA: 0.2 E.U./dL
pH, UA: 5 (ref 5.0–8.0)

## 2020-08-24 MED ORDER — MIRABEGRON ER 25 MG PO TB24
25.0000 mg | ORAL_TABLET | Freq: Every day | ORAL | 5 refills | Status: DC
Start: 2020-08-24 — End: 2020-10-05

## 2020-08-24 NOTE — Patient Instructions (Addendum)
Today we talked about ways to manage bladder urgency such as altering your diet to avoid irritative beverages and foods (bladder diet) as well as attempting to decrease stress and other exacerbating factors.  Try to drink up to 60 oz water a day.   The Most Bothersome Foods* The Least Bothersome Foods*  Coffee - Regular & Decaf Tea - caffeinated Carbonated beverages - cola, non-colas, diet & caffeine-free Alcohols - Beer, Red Wine, White Wine, Champagne Fruits - Grapefruit, [place], Orange, [employer] - Cranberry, Grapefruit, Orange, Pineapple Vegetables - Tomato & Tomato Products Flavor Enhancers - Hot peppers, Spicy foods, Chili, Horseradish, Vinegar, Monosodium glutamate (MSG) Artificial Sweeteners - NutraSweet, Sweet 'N Low, Equal (sweetener), Saccharin Ethnic foods - [place], [place], [place] food Express Scripts - low-fat & whole Fruits - Bananas, Blueberries, Honeydew melon, Pears, Raisins, Watermelon Vegetables - Broccoli, Brussels Sprouts, Tice, Carrots, Cauliflower, [place], Cucumber, Mushrooms, Peas, Radishes, Squash, Zucchini, White potatoes, Sweet potatoes & yams Poultry - Chicken, Eggs, [place], [employer] - Beef, Programmer, multimedia, Lamb Seafood - Shrimp, [place] fish, Salmon Grains - Oat, Rice Snacks - Pretzels, Popcorn  *Lissa Morales et al. Diet and its role in interstitial cystitis/bladder pain syndrome (IC/BPS) and comorbid conditions. [place] 2012 Jan 11.    We discussed the symptoms of overactive bladder (OAB), which include urinary urgency, urinary frequency, night-time urination, with or without urge incontinence.  We discussed management including behavioral therapy (decreasing bladder irritants by following a bladder diet, urge suppression strategies, timed voids, bladder retraining), physical therapy, medication; and for refractory cases posterior tibial nerve stimulation, sacral neuromodulation, and intravesical botulinum toxin injection.   For Beta-3 agonist  medication, we discussed the potential side effect of elevated blood pressure which is more likely to occur in individuals with uncontrolled hypertension. You were given Myrbetriq 25 mg.  It can take a month to start working so give it time, but if you have bothersome side effects call sooner and we can try a different medication.  Call us if you have trouble filling the prescription or if it's not covered by your insurance.

## 2020-09-03 ENCOUNTER — Inpatient Hospital Stay: Admission: RE | Admit: 2020-09-03 | Payer: PPO | Source: Ambulatory Visit

## 2020-09-04 ENCOUNTER — Ambulatory Visit (INDEPENDENT_AMBULATORY_CARE_PROVIDER_SITE_OTHER)
Admission: RE | Admit: 2020-09-04 | Discharge: 2020-09-04 | Disposition: A | Discharge status: Home / Self Care | Payer: PPO | Source: Ambulatory Visit | Attending: Internal Medicine | Admitting: Internal Medicine

## 2020-09-04 ENCOUNTER — Other Ambulatory Visit: Payer: Self-pay

## 2020-09-04 DIAGNOSIS — Z13820 Encounter for screening for osteoporosis: Secondary | ICD-10-CM

## 2020-09-21 ENCOUNTER — Telehealth (HOSPITAL_COMMUNITY): Payer: PPO | Admitting: Psychiatry

## 2020-09-28 NOTE — Progress Notes (Signed)
[facility] Return Visit  SUBJECTIVE  History of Present Illness: IMOJEAN YOSHINO is a 70 y.o. female seen in follow-up for overactive bladder. Plan at last visit was to start Myrbetriq 25mg  daily and to decrease bladder irritants (green tea, diet coke).   Still has urgency and some leakage, but has not changed much during the day. Is waking up less at night, maybe 1 time per night (compared to 3-4 times previously). Still drinking the green tea and coke zero. The medication is expensive for her ($45).   Past Medical History: Patient  has a past medical history of ADD (attention deficit disorder), Anxiety, Arthritis, Carpal tunnel syndrome, Colon polyp, Constipation, Depression, Gallstones, GERD (gastroesophageal reflux disease), Hypertension, Status post dilation of esophageal narrowing, and Thyroid disease.   Past Surgical History: She  has a past surgical history that includes Cholecystectomy; Abdominal hysterectomy; Tubal ligation; fx leg; Nasal sinus surgery; Closed reduction hand fracture; Carpal tunnel release (Left); Tibia fracture surgery; and Shoulder surgery.   Medications: She has a current medication list which includes the following prescription(s): trospium chloride, b complex-c, bisoprolol-hydrochlorothiazide, escitalopram, eszopiclone, and levothyroxine.   Allergies: Patient is allergic to asa [aspirin], augmentin [amoxicillin-pot clavulanate], and morphine and related.   Social History: Patient  reports that she has never smoked. She has never used smokeless tobacco. She reports that she does not drink alcohol and does not use drugs.      OBJECTIVE     Physical Exam: Vitals:   10/05/20 1419  BP: 126/75  Pulse: 80  Weight: 199 lb (90.3 kg)  Height: 5\' 1"  (1.549 m)   Gen: No apparent distress, A&O x 3.  Detailed Urogynecologic Evaluation:  Deferred.    ASSESSMENT AND PLAN    Ms. Budge is a 70 y.o. with:  1. Overactive bladder    -  She has not had significant improvement in her daytime symptoms. We discussed increasing medication dose or changing medications. Myrbetriq has already been expensive for her, so she would like to try a different med. Prescribed Trospium 60mg  daily.  - Will follow up 6 weeks to review progress with new medication.  - If she does not see improvement, will consider third line therapy options. Discussed these today.   Follow up 6 weeks.   Jaquita Folds, MD  Time spent: I spent 20 minutes dedicated to the care of this patient on the date of this encounter to include pre-visit review of records, face-to-face time with the patient and post visit documentation and ordering medication/ testing.

## 2020-10-05 ENCOUNTER — Ambulatory Visit (INDEPENDENT_AMBULATORY_CARE_PROVIDER_SITE_OTHER): Payer: PPO | Admitting: Obstetrics and Gynecology

## 2020-10-05 ENCOUNTER — Encounter: Payer: Self-pay | Admitting: Obstetrics and Gynecology

## 2020-10-05 ENCOUNTER — Other Ambulatory Visit: Payer: Self-pay

## 2020-10-05 VITALS — BP 126/75 | HR 80 | Ht 61.0 in | Wt 199.0 lb

## 2020-10-05 DIAGNOSIS — N3281 Overactive bladder: Secondary | ICD-10-CM

## 2020-10-05 HISTORY — DX: Overactive bladder: N32.81

## 2020-10-05 MED ORDER — TROSPIUM CHLORIDE ER 60 MG PO CP24: 1.0000 | ORAL_CAPSULE | Freq: Every day | ORAL | 5 refills | Status: DC

## 2020-10-05 NOTE — Patient Instructions (Signed)
We discussed the symptoms of overactive bladder (OAB), which include urinary urgency, urinary frequency, night-time urination, with or without urge incontinence.  We discussed management including behavioral therapy (decreasing bladder irritants by following a bladder diet, urge suppression strategies, timed voids, bladder retraining), physical therapy, medication; and for refractory cases posterior tibial nerve stimulation, sacral neuromodulation, and intravesical botulinum toxin injection.   For anticholinergic medications, we discussed the potential side effects of anticholinergics including dry eyes, dry mouth, constipation, rare risks of cognitive impairment and urinary retention. You were given a prescription for Trospium 60mg  daily.   It can take a month to start working so give it time, but if you have bothersome side effects call sooner and we can try a different medication.  Call us if you have trouble filling the prescription or if it's not covered by your insurance.

## 2020-10-17 ENCOUNTER — Other Ambulatory Visit: Payer: Self-pay

## 2020-10-17 ENCOUNTER — Telehealth (INDEPENDENT_AMBULATORY_CARE_PROVIDER_SITE_OTHER): Payer: PPO | Admitting: Psychiatry

## 2020-10-17 DIAGNOSIS — F3341 Major depressive disorder, recurrent, in partial remission: Secondary | ICD-10-CM

## 2020-10-17 DIAGNOSIS — F324 Major depressive disorder, single episode, in partial remission: Secondary | ICD-10-CM

## 2020-10-17 MED ORDER — ESZOPICLONE 3 MG PO TABS: 3.0000 mg | ORAL_TABLET | Freq: Every evening | ORAL | 5 refills | Status: DC | PRN

## 2020-10-17 MED ORDER — ESCITALOPRAM OXALATE 20 MG PO TABS: 20.0000 mg | ORAL_TABLET | Freq: Every day | ORAL | 5 refills | Status: DC

## 2020-10-17 NOTE — Progress Notes (Signed)
Patient ID: Paige [name], female   DOB: 04-15-1951, 70 y.o.   MRN: [id number] Patient ID: Paige [name], female   DOB: [birth year], 70 y.o.   MRN: [id number] [facility] MD Progress Note  10/17/2020 3:59 PM [name]   Pa time.st Medical History:   MRN:  [id number] Diagnosis major depressis  Today the patient is made a decision this week toward her former.  She says she is having.  She just does not want to go.  She does breaking the habit of having to go.  Apparently financially she is reddened.  She is 1 years old.  She is going to also friends in the next week but she is told everybody that she is going to retire.  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  Patient says she is sleeping and eating fairly well.  Her energy level is fair.  She can still think and concentrate.  She watches a lot of TV.  She likes cold television programs.  She drinks no alcohol and uses no drugs.  She denies any chest pain or shortness of breath.  She is not anhedonic.  She has never been psychotic.  The patient takes care of all her basic needs and her instrumental daily ADLs.  Overall patient feels like she is ready to retire and he must agree.  The patient had problems with Effexor got changed to Lexapro.  I am not sure it has a dramatic effect.  For now she will continue the maximum dose.  She does say the Johnnye Sima helps her get to sleep.  She will continue the [place].  The patient will return to see me in 3 months.  Overall I think she is stable. Chief complaint depressed mood stat  At this time the patient i Past Medical History:  Diagnosis Date   ADD (attention deficit disorder)    Pt has hard time focusing   Anxiety    Arthritis    Carpal tunnel syndrome    Colon polyp    Constipation    Depression    Gallstones    GERD (gastroesophageal reflux disease)     Hypertension    Status post dilation of esophageal narrowing    Thyroid disease     Past Surgical History:  Procedure Laterality Date   ABDOMINAL HYSTERECTOMY     CARPAL TUNNEL RELEASE Left    CHOLECYSTECTOMY     CLOSED REDUCTION HAND FRACTURE     right hand   fx leg     NASAL SINUS SURGERY     SHOULDER SURGERY     reattac tendon; right shoulder   TIBIA FRACTURE SURGERY     steel rod right leg   TUBAL LIGATION     Family History:  Family History  Problem Relation Age of Onset   Hyperlipidemia Mother    Colon cancer Mother        87   Lung cancer Father    Cancer Paternal Grandfather        mets   Leukemia Maternal Aunt    Family Psychiatric  History:  Social History:  Social History   Substance and Sexual Activity  Alcohol Use No     Social History   Substance and Sexual Activity  Drug Use No    Social History   Socioeconomic History  Marital status: Widowed    Spouse name: Not on file   Number of children: 2   Years of education: Not on file   Highest education level: High school graduate  Occupational History   Occupation: retired    Fish farm manager: UNEMPLOYED   Occupation: Conservation officer, historic buildings: [employer]  Tobacco Use   Smoking status: Never Smoker   Smokeless tobacco: Never Used  Scientific laboratory technician Use: Never used  Substance and Sexual Activity   Alcohol use: No   Drug use: No   Sexual activity: Not Currently  Other Topics Concern   Not on file  Social History Narrative   Works at [employer] - husband died of lung cancer      No regular exercise   Social Determinants of Health   Financial Resource Strain: Low Risk    Difficulty of Paying Living Expenses: Not hard at all  Food Insecurity: No Food Insecurity   Worried About Charity fundraiser in the Last Year: Never true   Arboriculturist in the Last Year: Never true  Transportation Needs: No Transportation Needs    Lack of Transportation (Medical): No   Lack of Transportation (Non-Medical): No  Physical Activity: Sufficiently Active   Days of Exercise per Week: 5 days   Minutes of Exercise per Session: 150+ min  Stress: Not on file  Social Connections: Moderately Isolated   Frequency of Communication with Friends and Family: More than three times a week   Frequency of Social Gatherings with Friends and Family: Three times a week   Attends Religious Services: 1 to 4 times per year   Active Member of Clubs or Organizations: No   Attends Archivist Meetings: Never   Marital Status: Widowed   Additional Social History:                         Sleep: Fair  Appetite:  Good  Current Medications: Current Outpatient Medications  Medication Sig Dispense Refill   B Complex-C (SUPER B COMPLEX PO) Take by mouth.     bisoprolol-hydrochlorothiazide (ZIAC) 5-6.25 MG tablet Take 1 tablet by mouth daily. Annual appt due in Nov must see provider for future refills 90 tablet 0   escitalopram (LEXAPRO) 20 MG tablet Take 1 tablet (20 mg total) by mouth daily. 30 tablet 5   Eszopiclone 3 MG TABS Take 1 tablet (3 mg total) by mouth at bedtime as needed. Take immediately before bedtime 30 tablet 5   levothyroxine (EUTHYROX) 112 MCG tablet Take 1 tablet (112 mcg total) by mouth daily. 90 tablet 1   Trospium Chloride 60 MG CP24 Take 1 capsule (60 mg total) by mouth daily. 30 capsule 5   No current facility-administered medications for this visit.    Lab Results: No results found for this or any previous visit (from the past 48 hour(s)).  Blood Alcohol level:  No results found for: [facility]  Physical Findings: AIMS:  , ,  ,  ,    CIWA:    COWS:     Musculoskeletal: Strength & Muscle Tone: within normal limits Gait & Station: normal Patient leans: N/A  Psychiatric Specialty Exam: ROS  There were no vitals taken for this visit.There is no height or weight on file to calculate  BMI.  General Appearance: Fairly Groomed  Engineer, water::  Good  Speech:  Clear and Coherent  Volume:  Normal  Mood:  Euthymic  Affect:  Blunt  Thought Process:  Coherent  Orientation:  Full (Time, Place, and Person)  Thought Content:  WDL  Suicidal Thoughts:  No  Homicidal Thoughts:  No  Memory:  NA  Judgement:  NA  Insight:  Good  Psychomotor Activity:  Decreased  Concentration:  Fair  Recall:  [place] of Knowledge:Good  Language: Fair  Akathisia:  No  Handed:  Right  AIMS (if indicated):     Assets:  Desire for Improvement  ADL's:  Intact  Cognition: WNL  Sleep:      Treatment Plan Summary: 10/17/2020, 3:59 PM  At this time the patient this patient has 2 problems.  First is that of major depression.  She will continue taking Wellbutrin but we will in fact reduce the dose from 150 XL down to a much lower dose of 100 mg slow release that she will take in the morning.  Her second problem is that of insomnia and she will continue taking Lunesta.  She will return to see me in 2-1/2 months.  The patient does not describe any other side effects from the Wellbutrin other than it seems to make her more energized and assertive.

## 2020-10-21 ENCOUNTER — Other Ambulatory Visit: Payer: Self-pay | Admitting: Internal Medicine

## 2020-11-05 ENCOUNTER — Telehealth: Payer: Self-pay | Admitting: Internal Medicine

## 2020-11-05 NOTE — Progress Notes (Signed)
  Chronic Care Management   Outreach Note  11/05/2020 Name: Paige [name] MRN: [id number] DOB: [birth year]  Referred by: Binnie Rail, MD Reason for referral : No chief complaint on file.   An unsuccessful telephone outreach was attempted today. The patient was referred to the pharmacist for assistance with care management and care coordination.   Follow Up Plan:   Carley Perdue [employer] Scheduler

## 2020-11-06 ENCOUNTER — Telehealth: Payer: Self-pay | Admitting: Internal Medicine

## 2020-11-06 NOTE — Progress Notes (Signed)
  Chronic Care Management   Outreach Note  11/06/2020 Name: Paige [name] MRN: [id number] DOB: 1950-09-13  Referred by: Binnie Rail, MD Reason for referral : No chief complaint on file.   A second unsuccessful telephone outreach was attempted today. The patient was referred to pharmacist for assistance with care management and care coordination.  Follow Up Plan:   Carley Perdue [employer] Scheduler

## 2020-11-07 ENCOUNTER — Telehealth: Payer: Self-pay | Admitting: Internal Medicine

## 2020-11-07 DIAGNOSIS — L720 Epidermal cyst: Secondary | ICD-10-CM | POA: Diagnosis not present

## 2020-11-07 DIAGNOSIS — C44519 Basal cell carcinoma of skin of other part of trunk: Secondary | ICD-10-CM | POA: Diagnosis not present

## 2020-11-07 DIAGNOSIS — Z85828 Personal history of other malignant neoplasm of skin: Secondary | ICD-10-CM | POA: Diagnosis not present

## 2020-11-07 DIAGNOSIS — D1801 Hemangioma of skin and subcutaneous tissue: Secondary | ICD-10-CM | POA: Diagnosis not present

## 2020-11-07 DIAGNOSIS — L570 Actinic keratosis: Secondary | ICD-10-CM | POA: Diagnosis not present

## 2020-11-07 DIAGNOSIS — L821 Other seborrheic keratosis: Secondary | ICD-10-CM | POA: Diagnosis not present

## 2020-11-07 DIAGNOSIS — D485 Neoplasm of uncertain behavior of skin: Secondary | ICD-10-CM | POA: Diagnosis not present

## 2020-11-07 DIAGNOSIS — D225 Melanocytic nevi of trunk: Secondary | ICD-10-CM | POA: Diagnosis not present

## 2020-11-07 NOTE — Progress Notes (Signed)
  Chronic Care Management   Note  11/07/2020 Name: Paige [name] MRN: [id number] DOB: 09/23/1950  Paige [name] is a 70 y.o. year old female who is a primary care patient of Burns, Claudina Lick, MD. I reached out to Paige [name] by phone today in response to a referral sent by Paige [name]'s PCP, Binnie Rail, MD.   Paige [name] was given information about Chronic Care Management services today including:  1. CCM service includes personalized support from designated clinical staff supervised by her physician, including individualized plan of care and coordination with other care providers 2. 24/7 contact phone numbers for assistance for urgent and routine care needs. 3. Service will only be billed when office clinical staff spend 20 minutes or more in a month to coordinate care. 4. Only one practitioner may furnish and bill the service in a calendar month. 5. The patient may stop CCM services at any time (effective at the end of the month) by phone call to the office staff.   Patient agreed to services and verbal consent obtained.   Follow up plan:   Carley Perdue [employer] Scheduler

## 2020-11-15 NOTE — Progress Notes (Signed)
[facility] Return Visit  SUBJECTIVE  History of Present Illness: Paige [name] is a 70 y.o. female seen in follow-up for overactive bladder. At last visit, changed from Myrbetriq to Trospium due to cost of medication.  Has cut back on the green tea and coke zero, and is drinking more water.   Denies any side effects of dry mouth or constipation. Has always had dry eyes and this has not worsened. Daytime voids are normal. Wakes only once at night. Does not leak every day.    Past Medical History: Patient  has a past medical history of ADD (attention deficit disorder), Anxiety, Arthritis, Carpal tunnel syndrome, Colon polyp, Constipation, Depression, Gallstones, GERD (gastroesophageal reflux disease), Hypertension, Status post dilation of esophageal narrowing, and Thyroid disease.   Past Surgical History: She  has a past surgical history that includes Cholecystectomy; Abdominal hysterectomy; Tubal ligation; fx leg; Nasal sinus surgery; Closed reduction hand fracture; Carpal tunnel release (Left); Tibia fracture surgery; and Shoulder surgery.   Medications: She has a current medication list which includes the following prescription(s): b complex-c, bisoprolol-hydrochlorothiazide, escitalopram, eszopiclone, levothyroxine, and trospium chloride.   Allergies: Patient is allergic to asa [aspirin], augmentin [amoxicillin-pot clavulanate], and morphine and related.   Social History: Patient  reports that she has never smoked. She has never used smokeless tobacco. She reports that she does not drink alcohol and does not use drugs.      OBJECTIVE     Physical Exam: Vitals:   11/16/20 1418  BP: 110/71  Pulse: 61  Weight: 202 lb (91.6 kg)   Gen: No apparent distress, A&O x 3.  Detailed Urogynecologic Evaluation:  Deferred.    ASSESSMENT AND PLAN    [name] is a 70 y.o. with:  1. Overactive bladder    - She is doing well on Trospium 60mg , will continue  without side effects. Will continue this, new refills sent.  - Continue to drink water and reduce bladder irritants.   Follow up 4 months  Jaquita Folds, MD  Time spent: I spent 20 minutes dedicated to the care of this patient on the date of this encounter to include pre-visit review of records, face-to-face time with the patient and post visit documentation and ordering medication/ testing.

## 2020-11-16 ENCOUNTER — Ambulatory Visit (INDEPENDENT_AMBULATORY_CARE_PROVIDER_SITE_OTHER): Payer: PPO | Admitting: Obstetrics and Gynecology

## 2020-11-16 ENCOUNTER — Encounter: Payer: Self-pay | Admitting: Obstetrics and Gynecology

## 2020-11-16 DIAGNOSIS — N3281 Overactive bladder: Secondary | ICD-10-CM

## 2020-11-16 MED ORDER — TROSPIUM CHLORIDE ER 60 MG PO CP24: 1.0000 | ORAL_CAPSULE | Freq: Every day | ORAL | 5 refills | Status: DC

## 2020-11-16 NOTE — Patient Instructions (Signed)
Continue with the Trospium 60mg  daily for your overactive bladder. We will follow up in 4 months.

## 2020-11-28 ENCOUNTER — Telehealth (HOSPITAL_COMMUNITY): Payer: PPO | Admitting: Psychiatry

## 2020-12-10 DIAGNOSIS — M25532 Pain in left wrist: Secondary | ICD-10-CM | POA: Insufficient documentation

## 2020-12-10 DIAGNOSIS — M79632 Pain in left forearm: Secondary | ICD-10-CM | POA: Diagnosis not present

## 2020-12-10 DIAGNOSIS — M79642 Pain in left hand: Secondary | ICD-10-CM | POA: Diagnosis not present

## 2020-12-10 HISTORY — DX: Pain in left wrist: M25.532

## 2020-12-13 DIAGNOSIS — M79642 Pain in left hand: Secondary | ICD-10-CM | POA: Diagnosis not present

## 2020-12-13 DIAGNOSIS — M1180 Other specified crystal arthropathies, unspecified site: Secondary | ICD-10-CM | POA: Diagnosis not present

## 2020-12-13 DIAGNOSIS — M25532 Pain in left wrist: Secondary | ICD-10-CM | POA: Diagnosis not present

## 2020-12-26 ENCOUNTER — Other Ambulatory Visit: Payer: Self-pay | Admitting: Internal Medicine

## 2020-12-26 DIAGNOSIS — Z1231 Encounter for screening mammogram for malignant neoplasm of breast: Secondary | ICD-10-CM

## 2021-01-20 NOTE — Patient Instructions (Addendum)
  Blood work was ordered.     Medications changes include :   none    A referral was ordered for [facility] speech and hearing.     Someone from their office will call you to schedule an appointment.     Please followup in 6 months

## 2021-01-20 NOTE — Progress Notes (Signed)
Subjective:    Patient ID: Paige [name], female    DOB: [birth year], 70 y.o.   MRN: [id number]  HPI The patient is here for follow up of their chronic medical problems, including htn, hypothyroidism, prediabetes, HLD  She is active, but not exercising.   She is not eating as well as she should - she has gained weight.  She retired in March.     Medications and allergies reviewed with patient and updated if appropriate.  Patient Active Problem List   Diagnosis Date Noted  . Overactive bladder 10/05/2020  . Deviated septum 02/21/2019  . Chest wall pain 02/14/2019  . Right shoulder pain 02/14/2019  . Nasal fracture 02/13/2019  . Prediabetes 07/05/2018  . Popping sound of knee joint 07/05/2018  . Chronic bilateral low back pain without sciatica 07/30/2017  . Hyperlipidemia 07/04/2017  . Piriformis syndrome of left side 04/29/2016  . Left hip pain 03/27/2016  . Lumbar radiculopathy 03/19/2016  . Neck pain on right side 01/16/2016  . AK (actinic keratosis) 10/19/2012  . CARCINOMA, BASAL CELL, BACK 11/16/2009  . ANXIETY DISORDER, GENERALIZED 07/30/2009  . BACK PAIN WITH RADICULOPATHY 05/28/2009  . Unspecified thrombosed hemorrhoids 10/17/2008  . OSTEOARTHRITIS 12/20/2007  . Hypothyroidism 06/08/2007  . Attention deficit hyperactivity disorder (ADHD) 06/08/2007  . Carpal tunnel syndrome 06/08/2007  . Essential hypertension 06/08/2007    Current Outpatient Medications on File Prior to Visit  Medication Sig Dispense Refill  . B Complex-C (SUPER B COMPLEX PO) Take by mouth.    . bisoprolol-hydrochlorothiazide (ZIAC) 5-6.25 MG tablet TAKE 1 TABLET BY MOUTH ONCE DAILY(ANNUAL APPT DUE IN NOV MUST SEE PROVIDER FOR FUTURE REFILLS) 90 tablet 0  . escitalopram (LEXAPRO) 20 MG tablet Take 1 tablet (20 mg total) by mouth daily. 30 tablet 5  . Eszopiclone 3 MG TABS Take 1 tablet (3 mg total) by mouth at bedtime as needed. Take immediately before bedtime 30 tablet 5  . levothyroxine  (EUTHYROX) 112 MCG tablet Take 1 tablet (112 mcg total) by mouth daily. 90 tablet 1  . Trospium Chloride 60 MG CP24 Take 1 capsule (60 mg total) by mouth daily. 30 capsule 5  . mirabegron ER (MYRBETRIQ) 25 MG TB24 tablet Myrbetriq 25 mg tablet,extended release  TAKE 1 TABLET BY MOUTH ONCE DAILY     No current facility-administered medications on file prior to visit.    Past Medical History:  Diagnosis Date  . ADD (attention deficit disorder)    Pt has hard time focusing  . Anxiety   . Arthritis   . Carpal tunnel syndrome   . Colon polyp   . Constipation   . Depression   . Gallstones   . GERD (gastroesophageal reflux disease)   . Hypertension   . Status post dilation of esophageal narrowing   . Thyroid disease     Past Surgical History:  Procedure Laterality Date  . ABDOMINAL HYSTERECTOMY    . CARPAL TUNNEL RELEASE Left   . CHOLECYSTECTOMY    . CLOSED REDUCTION HAND FRACTURE     right hand  . fx leg    . NASAL SINUS SURGERY    . SHOULDER SURGERY     reattac tendon; right shoulder  . TIBIA FRACTURE SURGERY     steel rod right leg  . TUBAL LIGATION      Social History   Socioeconomic History  . Marital status: Widowed    Spouse name: Not on file  . Number of children: 2  .  Years of education: Not on file  . Highest education level: High school graduate  Occupational History  . Occupation: retired    Fish farm manager: UNEMPLOYED  . Occupation: Conservation officer, historic buildings: [employer]  Tobacco Use  . Smoking status: Never Smoker  . Smokeless tobacco: Never Used  Vaping Use  . Vaping Use: Never used  Substance and Sexual Activity  . Alcohol use: No  . Drug use: No  . Sexual activity: Not Currently  Other Topics Concern  . Not on file  Social History Narrative   Works at [employer] - husband died of lung cancer      No regular exercise   Social Determinants of Health   Financial Resource Strain: Not on file  Food Insecurity:  Not on file  Transportation Needs: Not on file  Physical Activity: Not on file  Stress: Not on file  Social Connections: Not on file    Family History  Problem Relation Age of Onset  . Hyperlipidemia Mother   . Colon cancer Mother        62  . Lung cancer Father   . Cancer Paternal Grandfather        mets  . Leukemia Maternal Aunt     Review of Systems  Constitutional: Negative for fever.  HENT: Positive for hearing loss.   Respiratory: Negative for cough, shortness of breath and wheezing.   Cardiovascular: Negative for chest pain, palpitations and leg swelling.  Neurological: Positive for light-headedness (occ). Negative for headaches.       Objective:   Vitals:   01/21/21 1525  BP: 128/82  Pulse: 74  Temp: 97.9 F (36.6 C)  SpO2: 96%   BP Readings from Last 3 Encounters:  01/21/21 128/82  11/16/20 110/71  10/05/20 126/75   Wt Readings from Last 3 Encounters:  01/21/21 207 lb (93.9 kg)  11/16/20 202 lb (91.6 kg)  10/05/20 199 lb (90.3 kg)   Body mass index is 39.11 kg/m.   Physical Exam    Constitutional: Appears well-developed and well-nourished. No distress.  HENT:  Head: Normocephalic and atraumatic.  Neck: Neck supple. No tracheal deviation present. No thyromegaly present.  No cervical lymphadenopathy Cardiovascular: Normal rate, regular rhythm and normal heart sounds.   No murmur heard. No carotid bruit .  No edema Pulmonary/Chest: [place] normal and breath sounds normal. No respiratory distress. No has no wheezes. No rales.  Skin: Skin is warm and dry. Not diaphoretic.  Psychiatric: Normal mood and affect. Behavior is normal.      Assessment & Plan:    See Problem List for Assessment and Plan of chronic medical problems.    This visit occurred during the SARS-CoV-2 public health emergency.  Safety protocols were in place, including screening questions prior to the visit, additional usage of staff PPE, and extensive cleaning of exam room  while observing appropriate contact time as indicated for disinfecting solutions.

## 2021-01-21 ENCOUNTER — Ambulatory Visit (INDEPENDENT_AMBULATORY_CARE_PROVIDER_SITE_OTHER): Payer: PPO | Admitting: Pharmacist

## 2021-01-21 ENCOUNTER — Telehealth: Payer: Self-pay | Admitting: Pharmacist

## 2021-01-21 ENCOUNTER — Other Ambulatory Visit: Payer: Self-pay

## 2021-01-21 ENCOUNTER — Encounter: Payer: Self-pay | Admitting: Internal Medicine

## 2021-01-21 ENCOUNTER — Ambulatory Visit (INDEPENDENT_AMBULATORY_CARE_PROVIDER_SITE_OTHER): Payer: PPO | Admitting: Internal Medicine

## 2021-01-21 VITALS — BP 128/82 | HR 74 | Temp 97.9°F | Ht 61.0 in | Wt 207.0 lb

## 2021-01-21 DIAGNOSIS — E782 Mixed hyperlipidemia: Secondary | ICD-10-CM | POA: Diagnosis not present

## 2021-01-21 DIAGNOSIS — I10 Essential (primary) hypertension: Secondary | ICD-10-CM | POA: Diagnosis not present

## 2021-01-21 DIAGNOSIS — E039 Hypothyroidism, unspecified: Secondary | ICD-10-CM | POA: Diagnosis not present

## 2021-01-21 DIAGNOSIS — F411 Generalized anxiety disorder: Secondary | ICD-10-CM

## 2021-01-21 DIAGNOSIS — N3281 Overactive bladder: Secondary | ICD-10-CM

## 2021-01-21 DIAGNOSIS — R7303 Prediabetes: Secondary | ICD-10-CM | POA: Diagnosis not present

## 2021-01-21 DIAGNOSIS — H9193 Unspecified hearing loss, bilateral: Secondary | ICD-10-CM

## 2021-01-21 DIAGNOSIS — M11232 Other chondrocalcinosis, left wrist: Secondary | ICD-10-CM

## 2021-01-21 HISTORY — DX: Other chondrocalcinosis, left wrist: M11.232

## 2021-01-21 NOTE — Progress Notes (Signed)
Chronic Care Management Pharmacy Note  01/21/2021 Name:  Paige [name] MRN:  [id number] DOB:  September 09, 1950  Summary: -Counseled extensively on prediabetes / cholesterol goals -Sleep/anxiety has improved overall since retirement  Recommendations/Changes made from today's visit: -Advised to use plate method for meal planning (prioritize vegetables/protein over carbs) -Advised to start an exercise routine; goal 150 min activity per week -Advised to get Shingrix and Covid booster at local pharmacy  Subjective: Paige [name] is an 70 y.o. year old female who is a primary patient of Burns, Claudina Lick, MD.  The CCM team was consulted for assistance with disease management and care coordination needs.    Engaged with patient face to face for initial visit in response to provider referral for pharmacy case management and/or care coordination services.   Consent to Services:  The patient was given the following information about Chronic Care Management services today, agreed to services, and gave verbal consent: 1. CCM service includes personalized support from designated clinical staff supervised by the primary care provider, including individualized plan of care and coordination with other care providers 2. 24/7 contact phone numbers for assistance for urgent and routine care needs. 3. Service will only be billed when office clinical staff spend 20 minutes or more in a month to coordinate care. 4. Only one practitioner may furnish and bill the service in a calendar month. 5.The patient may stop CCM services at any time (effective at the end of the month) by phone call to the office staff. 6. The patient will be responsible for cost sharing (co-pay) of up to 20% of the service fee (after annual deductible is met). Patient agreed to services and consent obtained.  Patient Care Team: Binnie Rail, MD as PCP - General (Internal Medicine) Norma Fredrickson, MD as Consulting Physician  (Psychiatry) Roseanne Kaufman, MD as Consulting Physician (Orthopedic Surgery) Calvert Cantor, MD as Consulting Physician (Ophthalmology) Charlton Haws, [facility] as Pharmacist (Pharmacist)  Patient lives at home alone. Her husband passed about 12 years ago. She has children and grandchildren close by who she visits with often. 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 Since retiring she has less stress but is also less active and has gained 7+ lbs.  Recent office visits: 07/23/20 Dr Quay Burow OV: chronic f/u; referred to urogynecology. Labs stable, A1c 6.4. Advised shingles vaccine. No med changes.  Recent consult visits: 11/16/20 Dr Wannetta Sender (urogynecology): f/u OAB. Continue trospium.   10/17/20 Dr Casimiro Needle (psychiatry): pt is stable, no med changes.  10/05/20 Dr Wannetta Sender (urogynecology): f/u OAB. Switched Myrbetriq to trospium due to cost.  08/13/20 Dr Amedeo Plenty (hand surgery): f/u L shoulder, knee pain.  Hospital visits: None in previous 6 months   Objective:  Lab Results  Component Value Date   CREATININE 0.68 08/16/2020   BUN 23 08/16/2020   GFR 88.21 08/16/2020   GFRNONAA 104.30 05/01/2010   NA 141 08/16/2020   K 3.7 08/16/2020   CALCIUM 9.2 08/16/2020   CO2 32 08/16/2020   GLUCOSE 110 (H) 08/16/2020    Lab Results  Component Value Date/Time   HGBA1C 6.4 08/16/2020 10:39 AM   HGBA1C 6.2 01/09/2020 02:59 PM   GFR 88.21 08/16/2020 10:39 AM   GFR 95.20 01/09/2020 02:59 PM    Last diabetic Eye exam: No results found for: HMDIABEYEEXA  Last diabetic Foot exam: No results found for: HMDIABFOOTEX   Lab Results  Component Value Date   CHOL 187 08/16/2020  HDL 48.30 08/16/2020   LDLCALC 104 (H) 08/16/2020   LDLDIRECT 148.0 07/03/2017   TRIG 175.0 (H) 08/16/2020   CHOLHDL 4 08/16/2020    Hepatic Function Latest Ref Rng & Units 08/16/2020 01/09/2020 07/12/2019  Total Protein 6.0 - 8.3 g/dL 7.0 7.5 8.0  Albumin 3.5 - 5.2 g/dL 4.1 4.5  4.2  AST 0 - 37 U/L _0 ALT 0 - 35 U/L _1 Alk Phosphatase 39 - 117 U/L 65 84 98  Total Bilirubin 0.2 - 1.2 mg/dL 0.4 0.4 0.4  Bilirubin, Direct 0.0 - 0.3 mg/dL - - -    Lab Results  Component Value Date/Time   TSH 0.90 08/16/2020 10:39 AM   TSH 0.48 01/09/2020 02:59 PM   FREET4 1.31 12/29/2011 05:12 PM   FREET4 1.2 10/01/2009 02:08 PM    CBC Latest Ref Rng & Units 08/16/2020 07/05/2018 07/03/2017  WBC 4.0 - 10.5 K/uL 7.8 8.9 7.0  Hemoglobin 12.0 - 15.0 g/dL 12.4 12.9 12.3  Hematocrit 36.0 - 46.0 % 36.5 38.4 37.8  Platelets 150.0 - 400.0 K/uL 254.0 226.0 212.0    Lab Results  Component Value Date/Time   VD25OH 34.82 06/27/2016 03:25 PM    Clinical ASCVD: No  The 10-year ASCVD risk score Mikey Bussing DC Jr., et al., 2013) is: 12.7%   Values used to calculate the score:     Age: 34 years     Sex: Female     Is Non-Hispanic African American: No     Diabetic: No     Tobacco smoker: No     Systolic Blood Pressure: 045 mmHg     Is BP treated: Yes     HDL Cholesterol: 48.3 mg/dL     Total Cholesterol: 187 mg/dL    Depression screen [facility] 2/9 01/21/2021 01/21/2021 01/11/2020  Decreased Interest _2 Down, Depressed, Hopeless 2 2 0  PHQ - 2 Score _3 Altered sleeping _4 Tired, decreased energy _5 Change in appetite _6 Feeling bad or failure about yourself  _7 Trouble concentrating 0 0 0  Moving slowly or fidgety/restless _8 Suicidal thoughts 0 0 0  PHQ-9 Score _9 Difficult doing work/chores Somewhat difficult Somewhat difficult -    GAD 7 : Generalized Anxiety Score 01/21/2021 01/11/2020  Nervous, Anxious, on Edge 1 1  Control/stop worrying 1 1  Worry too much - different things 1 1  Trouble relaxing 1 3  Restless 1 1  Easily annoyed or irritable 1 1  Afraid - awful might happen 2 1  Total GAD 7 Score 8 9  Anxiety Difficulty Somewhat difficult -      Social History   Tobacco Use  Smoking Status Never Smoker  Smokeless Tobacco  Never Used   BP Readings from Last 3 Encounters:  01/21/21 128/82  11/16/20 110/71  10/05/20 126/75   Pulse Readings from Last 3 Encounters:  01/21/21 74  11/16/20 61  10/05/20 80   Wt Readings from Last 3 Encounters:  01/21/21 207 lb (93.9 kg)  11/16/20 202 lb (91.6 kg)  10/05/20 199 lb (90.3 kg)   BMI Readings from Last 3 Encounters:  01/21/21 39.11 kg/m  11/16/20 38.17 kg/m  10/05/20 37.60 kg/m    Assessment/Interventions: Review of patient past medical history, allergies, medications, health status, including review of consultants reports, laboratory and other test data, was performed as part of  comprehensive evaluation and provision of chronic care management services.   SDOH:  (Social Determinants of Health) assessments and interventions performed: Yes SDOH Interventions   Flowsheet Row Most Recent Value  SDOH Interventions   Financial Strain Interventions Intervention Not Indicated     SDOH Screenings   Alcohol Screen: Not on file  Depression (PHQ2-9): Medium Risk  . PHQ-2 Score: 13  Financial Resource Strain: Low Risk   . Difficulty of Paying Living Expenses: Not hard at all  Food Insecurity: Not on file  Housing: Not on file  Physical Activity: Not on file  Social Connections: Not on file  Stress: Not on file  Tobacco Use: Low Risk   . Smoking Tobacco Use: Never Smoker  . Smokeless Tobacco Use: Never Used  Transportation Needs: Not on file    CCM Care Plan  Allergies  Allergen Reactions  . Asa [Aspirin] Other (See Comments)    SOB and blocks ears and nose  . Augmentin [Amoxicillin-Pot Clavulanate] Nausea And Vomiting  . Morphine And Related Nausea And Vomiting    Medications Reviewed Today    Reviewed by Binnie Rail, MD (Physician) on 01/21/21 at 1553  Med List Status: <None>  Medication Order Taking? Sig Documenting Provider Last Dose Status Informant  B Complex-C (SUPER B COMPLEX PO) [id number] Yes Take by mouth. [provider]  Taking Active   bisoprolol-hydrochlorothiazide [facility]) 5-6.25 MG tablet [id number] Yes TAKE 1 TABLET BY MOUTH ONCE DAILY(ANNUAL APPT DUE IN NOV MUST SEE PROVIDER FOR FUTURE REFILLS) Binnie Rail, MD Taking Active   escitalopram (LEXAPRO) 20 MG tablet [id number] Yes Take 1 tablet (20 mg total) by mouth daily. Norma Fredrickson, MD Taking Active   Eszopiclone 3 MG TABS [id number] Yes Take 1 tablet (3 mg total) by mouth at bedtime as needed. Take immediately before bedtime Plovsky, Berneta Sages, MD Taking Active   levothyroxine (EUTHYROX) 112 MCG tablet [id number] Yes Take 1 tablet (112 mcg total) by mouth daily. Binnie Rail, MD Taking Active   mirabegron ER (MYRBETRIQ) 25 MG TB24 tablet [id number]  Myrbetriq 25 mg tablet,extended release  TAKE 1 TABLET BY MOUTH ONCE DAILY [provider]  Active   Trospium Chloride 60 MG CP24 [id number] Yes Take 1 capsule (60 mg total) by mouth daily. Jaquita Folds, MD Taking Active           Patient Active Problem List   Diagnosis Date Noted  . Pseudogout of left wrist 01/21/2021  . Overactive bladder 10/05/2020  . Deviated septum 02/21/2019  . Chest wall pain 02/14/2019  . Right shoulder pain 02/14/2019  . Nasal fracture 02/13/2019  . Prediabetes 07/05/2018  . Popping sound of knee joint 07/05/2018  . Chronic bilateral low back pain without sciatica 07/30/2017  . Hyperlipidemia 07/04/2017  . Piriformis syndrome of left side 04/29/2016  . Left hip pain 03/27/2016  . Lumbar radiculopathy 03/19/2016  . Neck pain on right side 01/16/2016  . AK (actinic keratosis) 10/19/2012  . CARCINOMA, BASAL CELL, BACK 11/16/2009  . ANXIETY DISORDER, GENERALIZED 07/30/2009  . BACK PAIN WITH RADICULOPATHY 05/28/2009  . Unspecified thrombosed hemorrhoids 10/17/2008  . OSTEOARTHRITIS 12/20/2007  . Hypothyroidism 06/08/2007  . Attention deficit hyperactivity disorder (ADHD) 06/08/2007  . Carpal tunnel syndrome 06/08/2007  . Essential hypertension 06/08/2007     Immunization History  Administered Date(s) Administered  . Fluad Quad(high Dose 65+) 05/16/2019, 06/16/2020  . Influenza, High Dose Seasonal PF 06/27/2016, 07/03/2017, 07/05/2018  . PFIZER(Purple Top)SARS-COV-2 Vaccination 09/22/2019, 10/13/2019  . Pneumococcal Conjugate-13 07/18/2016  .  Pneumococcal Polysaccharide-23 05/25/2015  . Tdap 03/03/2012, 02/10/2019    Conditions to be addressed/monitored:  Hypertension, Hyperlipidemia, Depression, Anxiety, Overactive Bladder and Prediabetes  Care Plan : [place] plan  Updates made by Charlton Haws, [place] since 01/21/2021 12:00 AM    Problem: Hypertension, Hyperlipidemia, Depression, Anxiety, Overactive Bladder and Prediabetes   Priority: High    Long-Range Goal: Disease management   Start Date: 01/21/2021  Expected End Date: 01/21/2022  This Visit's Progress: On track  Priority: High  Note:   Current Barriers:  . Unable to independently monitor therapeutic efficacy  Pharmacist Clinical Goal(s):  Marland Kitchen Patient will achieve adherence to monitoring guidelines and medication adherence to achieve therapeutic efficacy through collaboration with PharmD and provider.   Interventions: . 1:1 collaboration with Binnie Rail, MD regarding development and update of comprehensive plan of care as evidenced by provider attestation and co-signature . Inter-disciplinary care team collaboration (see longitudinal plan of care) . Comprehensive medication review performed; medication list updated in electronic medical record  Hypertension (BP goal <130/80) -Controlled - pt does not monitor BP at home often but it has been at goal in office; she endorses compliance and denies side effects -Current treatment: . Bisoprolol-HCTZ 5-6.25 mg daily -Current home readings: n/a -Current dietary habits: no specific diet -Current exercise habits: none -Denies hypotensive/hypertensive symptoms -Educated on BP goals and benefits of medications for  prevention of heart attack, stroke and kidney damage;Daily salt intake goal < 2300 mg; Exercise goal of 150 minutes per week; -Counseled to monitor BP at home as needed, document, and provide log at future appointments -Counseled on diet and exercise extensively Recommended to continue current medication  Hyperlipidemia: (LDL goal < 100) -Controlled - LDL at goal and ASCVD risk < 10%; pt has deferred statin previously -Current treatment: none -Educated on Cholesterol goals; Importance of limiting foods high in cholesterol; Exercise goal of 150 minutes per week; -Counseled on diet and exercise extensively  Depression/Anxiety/Insomnia (Goal: manage symptoms) -Controlled - pt follows with Dr Casimiro Needle; she reports stress has decreased since retirement this year; she is sleeping well -Current treatment: . Escitalopram 20 mg daily . Eszopiclone 3 mg daily PRN -PHQ9: 1 (12/2019) -GAD7: 9 (06/2020) -Educated on Benefits of medication for symptom control -Recommended to continue current medication  Hypothyroidism (Goal: maintain TSH in goal range) -Controlled - TSH on low (hyperthyroid) end of normal -Current treatment  . Levothyroxine 112 mcg daily -Recommended to continue current medication  Overactive bladder (Goal: reduce frequency) -Controlled -pt repots medication has improved urinary frequency -Current treatment  . Trospium 60 mg daily -Medications previously tried: Myrbetriq -Recommended to continue current medication  Prediabetes (A1c goal <6.5%) -Not ideally controlled - A1c has increased most recently;  -Current meal patterns: 2 meals per day typically . breakfast: cereal . dinner: hamburgers, hot dogs, chicken patties . snacks: ice cream -Current exercise: none -Educated on A1c and blood sugar goals;Complications of diabetes including kidney damage, retinal damage, and cardiovascular disease; Exercise goal of 150 minutes per week; Benefits of weight loss; Plate  method -Counseled to check feet daily and get yearly eye exams -Counseled on diet and exercise extensively  Health Maintenance -Vaccine gaps: Shingrix, covid booster dose #3 -Recommended to schedule covid vaccine and Shingrix at [facility] (given covid vaccine phone number)  Patient Goals/Self-Care Activities . Patient will:  - take medications as prescribed focus on medication adherence by routine target a minimum of 150 minutes of moderate intensity exercise weekly engage in dietary modifications by  limiting carbs  -Get COVID booster and Shingrix at local pharmacy      Medication Assistance: None required.  Patient affirms current coverage meets needs.  Compliance/Adherence/Medication fill history: Care Gaps: Shingrix Covid booster dose #3 (due 11/10/19)  Star-Rating Drugs: None  Patient's preferred pharmacy is:  [facility] ([facility] [place] [facility] [place] [address] [place] ([place]) [place] [address] Phone: [phone] Fax: [phone]  Uses pill box? Yes Pt endorses 100% compliance  We discussed: Current pharmacy is preferred with insurance plan and patient is satisfied with pharmacy services Patient decided to: Continue current medication management strategy  Care Plan and Follow Up Patient Decision:  Patient agrees to Care Plan and Follow-up.  Plan: Telephone follow up appointment with care management team member scheduled for:  6 months  Charlene Brooke, PharmD, [place], CPP Clinical Pharmacist [facility] [phone]

## 2021-01-21 NOTE — Assessment & Plan Note (Signed)
Chronic BP well controlled Continue ziac 5-6.25 mg daily cmp

## 2021-01-21 NOTE — Assessment & Plan Note (Signed)
Chronic  Clinically euthyroid Currently taking levothyroxine 112 mcg qd Check tsh  Titrate med dose if needed

## 2021-01-21 NOTE — Progress Notes (Signed)
    Chronic Care Management Pharmacy Assistant   Name: Paige [name] MRN: [id number] DOB: 07/08/1951  Reason for Encounter: Initial Questions Appointment: OV 01/21/21 @ 2 pm   Recent office visits:  07/23/20 Quay Burow (PCP) - Annual Exam. D/c Venlafaxine. F/u 6 mos.  Recent consult visits:  11/16/20 Wannetta Sender (Urogynecology) - OAB. F/u 4 mos.  10/17/20 Plovsky (Psychiatry) - F/u Depression. F/u 2.5 mos.  10/05/20 Schroeder (Urogynecology) - OAB. Start Trospium chloride 60 mg. D/c Mirabegron.  08/24/20 Schroeder (Urogynecology) - OAB. Start Mirabegron 25 mg. Increase Escitalopram to 20 mg. F/u 6 weeks.  08/13/21 Gramig (Hand surgery) - Impingement syndrome of left shoulder.  08/09/21 Bissell (Orthopedic Surgery) - Pain in left knee.   08/08/20 Plovsky (Psychiatry) - Depression. Start Escitalopram 10 mg.  Hospital visits:  None in previous 6 months  Medications: Outpatient Encounter Medications as of 01/21/2021  Medication Sig  . B Complex-C (SUPER B COMPLEX PO) Take by mouth.  . bisoprolol-hydrochlorothiazide (ZIAC) 5-6.25 MG tablet TAKE 1 TABLET BY MOUTH ONCE DAILY(ANNUAL APPT DUE IN NOV MUST SEE PROVIDER FOR FUTURE REFILLS)  . escitalopram (LEXAPRO) 20 MG tablet Take 1 tablet (20 mg total) by mouth daily.  . Eszopiclone 3 MG TABS Take 1 tablet (3 mg total) by mouth at bedtime as needed. Take immediately before bedtime  . levothyroxine (EUTHYROX) 112 MCG tablet Take 1 tablet (112 mcg total) by mouth daily.  . Trospium Chloride 60 MG CP24 Take 1 capsule (60 mg total) by mouth daily.   No facility-administered encounter medications on file as of 01/21/2021.    Have you seen any other providers since your last visit?  Patient states she seen her Urogynecology for OAB and was given Trospium Chloride.  Any changes in your medications or health?  Patient states no changes.  Any side effects from any medications?  Patient states she stop taking the B-Complex because she felt like it was  causing her shoulder pain.  Do you have any symptoms or problems not managed by your medications?  Patient states no problems or symptoms.  Any concerns about your health right now?  Patient states she would like to have a referral for an Ear Audiologists because she's experiencing hard of hearing.  Has your provider asked that you check blood pressure, blood sugar, or follow special diet at home?  Patient states not at this time.  Do you get any type of exercise on a regular basis? Patient states no she just retired in April 2022 and just have been enjoying her free time.  Can you think of a goal you would like to reach for your health?  Patient states she would like to lose weight she's gained 7 lbs.   Do you have any problems getting your medications?  Patient states no problems getting meds but she sometimes have problems with her insurance.   Is there anything that you would like to discuss during the appointment?  Patient states no concerns at this time.  Please bring medications and supplements to appointment  Star Rating Drugs: N/A  Orinda Kenner, [place] Clinical Pharmacists Assistant [phone]  Time Spent: [phone]

## 2021-01-21 NOTE — Assessment & Plan Note (Signed)
Chronic Check lipid panel  Declined statin - working on lifestyle Regular exercise and healthy diet encouraged

## 2021-01-21 NOTE — Patient Instructions (Addendum)
Thank you for meeting with me to discuss your medications! I look forward to working with you to achieve your health care goals. Below is a summary of what we talked about during the visit:  You can call [phone] to set up your COVID booster appointment with [facility].  I would suggest going to [facility] for vaccines. You can also get the Shingles vaccine here.  [address], [place], [place] [address]  Outpatient pharmacy: [phone]     Phone number for Pharmacist: [phone]   Goals Addressed            This Visit's Progress   . Manage My Medicine       Timeframe:  Long-Range Goal Priority:  Medium Start Date:       01/21/21                      Expected End Date:     01/21/22                  Follow Up Date Dec 2022   - call for medicine refill 2 or 3 days before it runs out - call if I am sick and can't take my medicine - keep a list of all the medicines I take; vitamins and herbals too  -target a minimum of 150 minutes of moderate intensity exercise weekly -engage in dietary modifications by limiting carbs  -Get COVID booster and Shingrix at local pharmacy   Why is this important?   . These steps will help you keep on track with your medicines.   Notes:        Paige [name] was given information about Chronic Care Management services today including:  1. CCM service includes personalized support from designated clinical staff supervised by her physician, including individualized plan of care and coordination with other care providers 2. 24/7 contact phone numbers for assistance for urgent and routine care needs. 3. Standard insurance, coinsurance, copays and deductibles apply for chronic care management only during months in which we provide at least 20 minutes of these services. Most insurances cover these services at 100%, however patients may be responsible for any copay, coinsurance and/or deductible if applicable. This service may help you  avoid the need for more expensive face-to-face services. 4. Only one practitioner may furnish and bill the service in a calendar month. 5. The patient may stop CCM services at any time (effective at the end of the month) by phone call to the office staff.  Patient agreed to services and verbal consent obtained.   Print copy of patient instructions, educational materials, and care plan provided in person. Telephone follow up appointment with pharmacy team member scheduled for: 6 months  Charlene Brooke, PharmD, [place], CPP Clinical Pharmacist [facility] [phone]   Prediabetes Eating Plan Prediabetes is a condition that causes blood sugar (glucose) levels to be higher than normal. This increases the risk for developing type 2 diabetes (type 2 diabetes mellitus). Working with a health care provider or nutrition specialist (dietitian) to make diet and lifestyle changes can help prevent the onset of diabetes. These changes may help you:  Control your blood glucose levels.  Improve your cholesterol levels.  Manage your blood pressure. What are tips for following this plan? Reading food labels  Read food labels to check the amount of fat, salt (sodium), and sugar in prepackaged foods. Avoid foods that have: ? Saturated fats. ? Trans fats. ? Added sugars.  Avoid foods that have more than 300 milligrams (mg) of sodium per serving. Limit your sodium intake to less than 2,300 mg each day. Shopping  Avoid buying pre-made and processed foods.  Avoid buying drinks with added sugar. Cooking  Cook with olive oil. Do not use butter, lard, or ghee.  Bake, broil, grill, steam, or boil foods. Avoid frying. Meal planning  Work with your dietitian to create an eating plan that is right for you. This may include tracking how many calories you take in each day. Use a food diary, notebook, or mobile application to track what you eat at each meal.  Consider  following a Mediterranean diet. This includes: ? Eating several servings of fresh fruits and vegetables each day. ? Eating fish at least twice a week. ? Eating one serving each day of whole grains, beans, nuts, and seeds. ? Using olive oil instead of other fats. ? Limiting alcohol. ? Limiting red meat. ? Using nonfat or low-fat dairy products.  Consider following a plant-based diet. This includes dietary choices that focus on eating mostly vegetables and fruit, grains, beans, nuts, and seeds.  If you have high blood pressure, you may need to limit your sodium intake or follow a diet such as the DASH (Dietary Approaches to Stop Hypertension) eating plan. The DASH diet aims to lower high blood pressure.   Lifestyle  Set weight loss goals with help from your health care team. It is recommended that most people with prediabetes lose 7% of their body weight.  Exercise for at least 30 minutes 5 or more days a week.  Attend a support group or seek support from a mental health counselor.  Take over-the-counter and prescription medicines only as told by your health care provider. What foods are recommended? Fruits Berries. Bananas. Apples. Oranges. Grapes. Papaya. Mango. Pomegranate. Kiwi. Grapefruit. Cherries. Vegetables Lettuce. Spinach. Peas. Beets. Cauliflower. Cabbage. Broccoli. Carrots. Tomatoes. Squash. Eggplant. Herbs. Peppers. Onions. Cucumbers. Brussels sprouts. Grains Whole grains, such as whole-wheat or whole-grain breads, crackers, cereals, and pasta. Unsweetened oatmeal. Bulgur. Barley. Quinoa. Brown rice. Corn or whole-wheat flour tortillas or taco shells. Meats and other proteins Seafood. Poultry without skin. Lean cuts of pork and beef. Tofu. Eggs. Nuts. Beans. Dairy Low-fat or fat-free dairy products, such as yogurt, cottage cheese, and cheese. Beverages Water. Tea. Coffee. Sugar-free or diet soda. Seltzer water. Low-fat or nonfat milk. Milk alternatives, such as soy or  almond milk. Fats and oils Olive oil. Canola oil. Sunflower oil. Grapeseed oil. Avocado. Walnuts. Sweets and desserts Sugar-free or low-fat pudding. Sugar-free or low-fat ice cream and other frozen treats. Seasonings and condiments Herbs. Sodium-free spices. Mustard. Relish. Low-salt, low-sugar ketchup. Low-salt, low-sugar barbecue sauce. Low-fat or fat-free mayonnaise. The items listed above may not be a complete list of recommended foods and beverages. Contact a dietitian for more information. What foods are not recommended? Fruits Fruits canned with syrup. Vegetables Canned vegetables. Frozen vegetables with butter or cream sauce. Grains Refined white flour and flour products, such as bread, pasta, snack foods, and cereals. Meats and other proteins Fatty cuts of meat. Poultry with skin. Breaded or fried meat. Processed meats. Dairy Full-fat yogurt, cheese, or milk. Beverages Sweetened drinks, such as iced tea and soda. Fats and oils Butter. Lard. Ghee. Sweets and desserts Baked goods, such as cake, cupcakes, pastries, cookies, and cheesecake. Seasonings and condiments Spice mixes with added salt. Ketchup. Barbecue sauce. Mayonnaise. The items listed above may not be a complete list of foods and beverages that are  not recommended. Contact a dietitian for more information. Where to find more information  American Diabetes Association: www.diabetes.org Summary  You may need to make diet and lifestyle changes to help prevent the onset of diabetes. These changes can help you control blood sugar, improve cholesterol levels, and manage blood pressure.  Set weight loss goals with help from your health care team. It is recommended that most people with prediabetes lose 7% of their body weight.  Consider following a Mediterranean diet. This includes eating plenty of fresh fruits and vegetables, whole grains, beans, nuts, seeds, fish, and low-fat dairy, and using olive oil instead of  other fats. This information is not intended to replace advice given to you by your health care provider. Make sure you discuss any questions you have with your health care provider. Document Revised: 11/03/2019 Document Reviewed: 11/03/2019 Elsevier Patient Education  [place].

## 2021-01-21 NOTE — Assessment & Plan Note (Addendum)
Chronic Last A1c was close to being in the diabetic range-stressed lifestyle changes Check a1c Low sugar / carb diet Stressed regular exercise

## 2021-01-22 LAB — COMPREHENSIVE METABOLIC PANEL
ALT: 8 U/L (ref 0–35)
AST: 22 U/L (ref 0–37)
Albumin: 4.4 g/dL (ref 3.5–5.2)
Alkaline Phosphatase: 80 U/L (ref 39–117)
BUN: 19 mg/dL (ref 6–23)
CO2: 23 mEq/L (ref 19–32)
Calcium: 10.2 mg/dL (ref 8.4–10.5)
Chloride: 100 mEq/L (ref 96–112)
Creatinine, Ser: 0.62 mg/dL (ref 0.40–1.20)
GFR: 89.92 mL/min (ref >60.00)
Glucose, Bld: 91 mg/dL (ref 70–99)
Potassium: 4 mEq/L (ref 3.5–5.1)
Sodium: 138 mEq/L (ref 135–145)
Total Bilirubin: 0.5 mg/dL (ref 0.2–1.2)
Total Protein: 7.6 g/dL (ref 6.0–8.3)

## 2021-01-22 LAB — HEMOGLOBIN A1C: Hgb A1c MFr Bld: 6.5 % (ref 4.6–6.5)

## 2021-01-22 LAB — LIPID PANEL
Cholesterol: 241 mg/dL — ABNORMAL HIGH (ref 0–200)
HDL: 63.9 mg/dL (ref >39.00)
LDL Cholesterol: 149 mg/dL — ABNORMAL HIGH (ref 0–99)
NonHDL: 176.82
Total CHOL/HDL Ratio: 4
Triglycerides: 140 mg/dL (ref 0.0–149.0)
VLDL: 28 mg/dL (ref 0.0–40.0)

## 2021-01-22 LAB — TSH: TSH: 0.5 u[IU]/mL (ref 0.35–4.50)

## 2021-01-24 ENCOUNTER — Other Ambulatory Visit: Payer: Self-pay | Admitting: Internal Medicine

## 2021-01-24 MED ORDER — ROSUVASTATIN CALCIUM 10 MG PO TABS
10.0000 mg | ORAL_TABLET | Freq: Every day | ORAL | 3 refills | Status: DC
Start: 2021-01-24 — End: 2021-07-26

## 2021-01-24 NOTE — Addendum Note (Signed)
Addended by: Binnie Rail on: 01/24/2021 08:49 PM   Modules accepted: Orders

## 2021-01-30 ENCOUNTER — Other Ambulatory Visit: Payer: Self-pay

## 2021-01-30 ENCOUNTER — Telehealth (INDEPENDENT_AMBULATORY_CARE_PROVIDER_SITE_OTHER): Payer: PPO | Admitting: Psychiatry

## 2021-01-30 DIAGNOSIS — F325 Major depressive disorder, single episode, in full remission: Secondary | ICD-10-CM | POA: Diagnosis not present

## 2021-01-30 MED ORDER — ESCITALOPRAM OXALATE 20 MG PO TABS: 20.0000 mg | ORAL_TABLET | Freq: Every day | ORAL | 5 refills | Status: DC

## 2021-01-30 MED ORDER — ESZOPICLONE 3 MG PO TABS: 3.0000 mg | ORAL_TABLET | Freq: Every evening | ORAL | 5 refills | Status: DC | PRN

## 2021-01-30 NOTE — Progress Notes (Signed)
Patient ID: Paige [name], female   DOB: 22-Aug-1950, 70 y.o.   MRN: [id number] Patient ID: Paige [name], female   DOB: 1951-01-16, 70 y.o.   MRN: [id number] [facility] MD Progress Note  01/30/2021 3:02 PM [name]   Pa time.st Medical History:   MRN:  [id number] Diagnosis major depressis   Today the patient is doing very well.  Her mood is good.  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  She likes what she does.  She keeps him company and probably does some light housework.  This is what the patient always wanted to do.  The patient is sleeping and eating well.  She is back she says she has gained a little bit of weight.  Her energy level is good.  She is able to think and concentrate without problem.  She drinks no alcohol.  She has no evidence of psychosis.  She seems to be in good spirits.  She has good energy.  The patient seems to be enjoying life.  She can think and concentrate without a problem as a good sense of work.  She is handling retirement very well.  She takes her medicines just as prescribed. Chief complaint depressed mood stat  At this time the patient i Past Medical History:  Diagnosis Date   ADD (attention deficit disorder)    Pt has hard time focusing   Anxiety    Arthritis    Carpal tunnel syndrome    Colon polyp    Constipation    Depression    Gallstones    GERD (gastroesophageal reflux disease)    Hypertension    Status post dilation of esophageal narrowing    Thyroid disease     Past Surgical History:  Procedure Laterality Date   ABDOMINAL HYSTERECTOMY     CARPAL TUNNEL RELEASE Left    CHOLECYSTECTOMY     CLOSED REDUCTION HAND FRACTURE     right hand   fx leg     NASAL SINUS SURGERY     SHOULDER SURGERY     reattac tendon; right shoulder   TIBIA FRACTURE SURGERY     steel rod right leg   TUBAL LIGATION     Family History:  Family History  Problem Relation Age of Onset   Hyperlipidemia Mother    Colon cancer  Mother        12   Lung cancer Father    Cancer Paternal Grandfather        mets   Leukemia Maternal Aunt    Family Psychiatric  History:  Social History:  Social History   Substance and Sexual Activity  Alcohol Use No     Social History   Substance and Sexual Activity  Drug Use No    Social History   Socioeconomic History   Marital status: Widowed    Spouse name: Not on file   Number of children: 2   Years of education: Not on file   Highest education level: High school graduate  Occupational History   Occupation: retired    Fish farm manager: UNEMPLOYED   Occupation: Conservation officer, historic buildings: [place]  Tobacco Use   Smoking status: Never   Smokeless tobacco: Never  Vaping Use   Vaping Use: Never used  Substance and Sexual Activity   Alcohol use: No   Drug use: No   Sexual activity: Not Currently  Other Topics Concern   Not on file  Social History  Narrative   Works at [employer] - husband died of lung cancer      No regular exercise   Social Determinants of Health   Financial Resource Strain: Low Risk    Difficulty of Paying Living Expenses: Not hard at all  Food Insecurity: Not on file  Transportation Needs: Not on file  Physical Activity: Not on file  Stress: Not on file  Social Connections: Not on file   Additional Social History:                         Sleep: Fair  Appetite:  Good  Current Medications: Current Outpatient Medications  Medication Sig Dispense Refill   B Complex-C (SUPER B COMPLEX PO) Take by mouth.     bisoprolol-hydrochlorothiazide (ZIAC) 5-6.25 MG tablet Take 1 tablet by mouth once daily 90 tablet 0   escitalopram (LEXAPRO) 20 MG tablet Take 1 tablet (20 mg total) by mouth daily. 30 tablet 5   Eszopiclone 3 MG TABS Take 1 tablet (3 mg total) by mouth at bedtime as needed. Take immediately before bedtime 30 tablet 5   EUTHYROX 112 MCG tablet Take 1 tablet by mouth once daily 90  tablet 0   mirabegron ER (MYRBETRIQ) 25 MG TB24 tablet Myrbetriq 25 mg tablet,extended release  TAKE 1 TABLET BY MOUTH ONCE DAILY     rosuvastatin (CRESTOR) 10 MG tablet Take 1 tablet (10 mg total) by mouth daily. 90 tablet 3   Trospium Chloride 60 MG CP24 Take 1 capsule (60 mg total) by mouth daily. 30 capsule 5   No current facility-administered medications for this visit.    Lab Results: No results found for this or any previous visit (from the past 48 hour(s)).  Blood Alcohol level:  No results found for: [facility]  Physical Findings: AIMS:  , ,  ,  ,    CIWA:    COWS:     Musculoskeletal: Strength & Muscle Tone: within normal limits Gait & Station: normal Patient leans: N/A  Psychiatric Specialty Exam: ROS  There were no vitals taken for this visit.There is no height or weight on file to calculate BMI.  General Appearance: Fairly Groomed  Engineer, water::  Good  Speech:  Clear and Coherent  Volume:  Normal  Mood:  Euthymic  Affect:  Blunt  Thought Process:  Coherent  Orientation:  Full (Time, Place, and Person)  Thought Content:  WDL  Suicidal Thoughts:  No  Homicidal Thoughts:  No  Memory:  NA  Judgement:  NA  Insight:  Good  Psychomotor Activity:  Decreased  Concentration:  Fair  Recall:  [employer] of Knowledge:Good  Language: Fair  Akathisia:  No  Handed:  Right  AIMS (if indicated):     Assets:  Desire for Improvement  ADL's:  Intact  Cognition: WNL  Sleep:      Treatment Plan Summary: 01/30/2021, 3:02 PM  This patient is doing very well.  Her first problem is that of major depression.  It is possible that she has a persistent dysthymic disorder but this seems to be fairly well contained taking Lexapro 20 mg.  Her second problem is insomnia.  She takes Lunesta 3 mg every night and gets a good night of sleep.  Generally she is functioning extremely well.  She is retired without much of a problem.  She will return to see me in 5 months.  At that time  we will  have a more thorough reevaluation hopefully in person.

## 2021-01-31 ENCOUNTER — Other Ambulatory Visit: Payer: Self-pay

## 2021-01-31 ENCOUNTER — Ambulatory Visit (INDEPENDENT_AMBULATORY_CARE_PROVIDER_SITE_OTHER): Payer: PPO

## 2021-01-31 VITALS — BP 124/80 | HR 79 | Temp 98.2°F | Ht 61.0 in | Wt 203.6 lb

## 2021-01-31 DIAGNOSIS — Z0000 Encounter for general adult medical examination without abnormal findings: Secondary | ICD-10-CM | POA: Diagnosis not present

## 2021-01-31 NOTE — Progress Notes (Signed)
Subjective:   Paige [name] is a 70 y.o. female who presents for Medicare Annual (Subsequent) preventive examination.  Review of Systems     Cardiac Risk Factors include: advanced age (>61men, >27 women);dyslipidemia;hypertension;obesity (BMI >30kg/m2)     Objective:    Today's Vitals   01/31/21 1227 01/31/21 1231  BP:  124/80  Pulse:  79  Temp:  98.2 F (36.8 C)  TempSrc:  Temporal  SpO2:  93%  Weight:  203 lb 9.6 oz (92.4 kg)  Height:  5\' 1"  (1.549 m)  PainSc: 0-No pain 0-No pain   Body mass index is 38.47 kg/m.  Advanced Directives 01/31/2021 01/05/2020 02/11/2018 09/01/2016  Does Patient Have a Medical Advance Directive? No No No No  Would patient like information on creating a medical advance directive? No - Patient declined - Yes (ED - Information included in AVS) -    Current Medications (verified) Outpatient Encounter Medications as of 01/31/2021  Medication Sig   B Complex-C (SUPER B COMPLEX PO) Take by mouth.   bisoprolol-hydrochlorothiazide (ZIAC) 5-6.25 MG tablet Take 1 tablet by mouth once daily   escitalopram (LEXAPRO) 20 MG tablet Take 1 tablet (20 mg total) by mouth daily.   Eszopiclone 3 MG TABS Take 1 tablet (3 mg total) by mouth at bedtime as needed. Take immediately before bedtime   EUTHYROX 112 MCG tablet Take 1 tablet by mouth once daily   mirabegron ER (MYRBETRIQ) 25 MG TB24 tablet Myrbetriq 25 mg tablet,extended release  TAKE 1 TABLET BY MOUTH ONCE DAILY   rosuvastatin (CRESTOR) 10 MG tablet Take 1 tablet (10 mg total) by mouth daily.   Trospium Chloride 60 MG CP24 Take 1 capsule (60 mg total) by mouth daily.   No facility-administered encounter medications on file as of 01/31/2021.    Allergies (verified) Asa [aspirin], Augmentin [amoxicillin-pot clavulanate], and Morphine and related   History: Past Medical History:  Diagnosis Date   ADD (attention deficit disorder)    Pt has hard time focusing   Anxiety    Arthritis    Carpal  tunnel syndrome    Colon polyp    Constipation    Depression    Gallstones    GERD (gastroesophageal reflux disease)    Hypertension    Status post dilation of esophageal narrowing    Thyroid disease    Past Surgical History:  Procedure Laterality Date   ABDOMINAL HYSTERECTOMY     CARPAL TUNNEL RELEASE Left    CHOLECYSTECTOMY     CLOSED REDUCTION HAND FRACTURE     right hand   fx leg     NASAL SINUS SURGERY     SHOULDER SURGERY     reattac tendon; right shoulder   TIBIA FRACTURE SURGERY     steel rod right leg   TUBAL LIGATION     Family History  Problem Relation Age of Onset   Hyperlipidemia Mother    Colon cancer Mother        59   Lung cancer Father    Cancer Paternal Grandfather        mets   Leukemia Maternal Aunt    Social History   Socioeconomic History   Marital status: Widowed    Spouse name: Not on file   Number of children: 2   Years of education: Not on file   Highest education level: High school graduate  Occupational History   Occupation: retired    Fish farm manager: UNEMPLOYED   Occupation: Conservation officer, historic buildings:  [employer]  Tobacco Use   Smoking status: Never   Smokeless tobacco: Never  Vaping Use   Vaping Use: Never used  Substance and Sexual Activity   Alcohol use: No   Drug use: No   Sexual activity: Not Currently  Other Topics Concern   Not on file  Social History Narrative   Works at [employer] - husband died of lung cancer      No regular exercise   Social Determinants of Health   Financial Resource Strain: Low Risk    Difficulty of Paying Living Expenses: Not hard at all  Food Insecurity: No Food Insecurity   Worried About Charity fundraiser in the Last Year: Never true   Arboriculturist in the Last Year: Never true  Transportation Needs: No Transportation Needs   Lack of Transportation (Medical): No   Lack of Transportation (Non-Medical): No  Physical Activity: Insufficiently Active    Days of Exercise per Week: 6 days   Minutes of Exercise per Session: 10 min  Stress: No Stress Concern Present   Feeling of Stress : Not at all  Social Connections: Moderately Integrated   Frequency of Communication with Friends and Family: More than three times a week   Frequency of Social Gatherings with Friends and Family: More than three times a week   Attends Religious Services: More than 4 times per year   Active Member of Clubs or Organizations: No   Attends Music therapist: More than 4 times per year   Marital Status: Widowed    Tobacco Counseling Counseling given: Not Answered   Clinical Intake:  Pre-visit preparation completed: Yes  Pain : No/denies pain Pain Score: 0-No pain     BMI - recorded: 38.47 Nutritional Status: BMI > 30  Obese Nutritional Risks: None Diabetes: No  How often do you need to have someone help you when you read instructions, pamphlets, or other written materials from your doctor or pharmacy?: 1 - Never What is the last grade level you completed in school?: HSG  Diabetic? no  Interpreter Needed?: No  Information entered by :: Lisette Abu, LPN   Activities of Daily Living In your present state of health, do you have any difficulty performing the following activities: 01/31/2021  Hearing? Y  Vision? N  Difficulty concentrating or making decisions? Y  Walking or climbing stairs? N  Dressing or bathing? N  Doing errands, shopping? N  Preparing Food and eating ? N  Using the Toilet? N  In the past six months, have you accidently leaked urine? Y  Do you have problems with loss of bowel control? N  Managing your Medications? N  Managing your Finances? N  Housekeeping or managing your Housekeeping? N  Some recent data might be hidden    Patient Care Team: Binnie Rail, MD as PCP - General (Internal Medicine) Norma Fredrickson, MD as Consulting Physician (Psychiatry) Roseanne Kaufman, MD as Consulting Physician  (Orthopedic Surgery) Calvert Cantor, MD as Consulting Physician (Ophthalmology) Charlton Haws, [facility] as Pharmacist (Pharmacist)  Indicate any recent Medical Services you may have received from other than [facility] providers in the past year (date may be approximate).     Assessment:   This is a routine wellness examination for Tajah.  Hearing/Vision screen No results found.  Dietary issues and exercise activities discussed: Current Exercise Habits: The patient does not participate in regular exercise at present;Home exercise routine, Type  of exercise: walking, Time (Minutes): 10, Frequency (Times/Week): 6, Weekly Exercise (Minutes/Week): 60, Intensity: Moderate, Exercise limited by: orthopedic condition(s)   Goals Addressed             This Visit's Progress    Patient Stated   On track    Lose weight by decrease eating sweets, breads, eat more fruit and veg. Increase physical activity by starting chair exercises.        Depression Screen PHQ 2/9 Scores 01/31/2021 01/21/2021 01/21/2021 01/11/2020 01/05/2020 01/05/2020 02/11/2018  PHQ - 2 Score 3 3 3 1  0 0 2  PHQ- 9 Score - 13 13 11  - - 6    Fall Risk Fall Risk  01/31/2021 01/05/2020 01/04/2019 12/20/2018 02/11/2018  Falls in the past year? 0 0 0 0 No  Number falls in past yr: 0 0 0 - -  Injury with Fall? 0 0 - - -  Risk for fall due to : No Fall Risks No Fall Risks - - -  Follow up Falls evaluation completed Falls evaluation completed;Falls prevention discussed - - -    FALL RISK PREVENTION PERTAINING TO THE HOME:  Any stairs in or around the home? Yes  If so, are there any without handrails? No  Home free of loose throw rugs in walkways, pet beds, electrical cords, etc? Yes  Adequate lighting in your home to reduce risk of falls? Yes   ASSISTIVE DEVICES UTILIZED TO PREVENT FALLS:  Life alert? No  Use of a cane, walker or w/c? No  Grab bars in the bathroom? No  Shower chair or bench in shower? No  Elevated toilet seat or a  handicapped toilet? No   TIMED UP AND GO:  Was the test performed? Yes .  Length of time to ambulate 10 feet: 6 sec.   Gait steady and fast without use of assistive device  Cognitive Function: Normal cognitive status assessed by direct observation by this Nurse Health Advisor. No abnormalities found.   MMSE - Mini Mental State Exam 02/11/2018  Orientation to time 5  Orientation to Place 5  Registration 3  Attention/ Calculation 5  Recall 1  Language- name 2 objects 2  Language- repeat 1  Language- follow 3 step command 3  Language- read & follow direction 1  Write a sentence 1  Copy design 1  Total score 28        Immunizations Immunization History  Administered Date(s) Administered   Fluad Quad(high Dose 65+) 05/16/2019, 06/16/2020   Influenza, High Dose Seasonal PF 06/27/2016, 07/03/2017, 07/05/2018   PFIZER(Purple Top)SARS-COV-2 Vaccination 09/22/2019, 10/13/2019, 06/02/2020   Pneumococcal Conjugate-13 07/18/2016   Pneumococcal Polysaccharide-23 05/25/2015   Tdap 03/03/2012, 02/10/2019    TDAP status: Up to date  Flu Vaccine status: Up to date  Pneumococcal vaccine status: Up to date  Covid-19 vaccine status: Completed vaccines  Qualifies for Shingles Vaccine? Yes   Zostavax completed No   Shingrix Completed?: No.    Education has been provided regarding the importance of this vaccine. Patient has been advised to call insurance company to determine out of pocket expense if they have not yet received this vaccine. Advised may also receive vaccine at local pharmacy or Health Dept. Verbalized acceptance and understanding.  Screening Tests Health Maintenance  Topic Date Due   Zoster Vaccines- Shingrix (1 of 2) Never done   COVID-19 Vaccine (4 - Booster for Pfizer series) 09/02/2020   INFLUENZA VACCINE  03/18/2021   COLONOSCOPY (Pts 45-43yrs Insurance coverage will need to  be confirmed)  09/30/2021   MAMMOGRAM  02/02/2022   DEXA SCAN  09/04/2025   TETANUS/TDAP   02/09/2029   Hepatitis C Screening  Completed   PNA vac Low Risk Adult  Completed   HPV VACCINES  Aged Out    Health Maintenance  Health Maintenance Due  Topic Date Due   Zoster Vaccines- Shingrix (1 of 2) Never done   COVID-19 Vaccine (4 - Booster for Pfizer series) 09/02/2020    Colorectal cancer screening: Type of screening: Colonoscopy. Completed 09/30/2016. Repeat every 5 years  Mammogram status: Completed 02/03/2020. Repeat every year  Bone Density status: Completed 09/04/2020. Results reflect: Bone density results: NORMAL. Repeat every 5 years.  Lung Cancer Screening: (Low Dose CT Chest recommended if Age 10-80 years, 30 pack-year currently smoking OR have quit w/in 15years.) does not qualify.   Lung Cancer Screening Referral: no  Additional Screening:  Hepatitis C Screening: does qualify; Completed yes  Vision Screening: Recommended annual ophthalmology exams for early detection of glaucoma and other disorders of the eye. Is the patient up to date with their annual eye exam?  Yes  Who is the provider or what is the name of the office in which the patient attends annual eye exams? [facility] If pt is not established with a provider, would they like to be referred to a provider to establish care? No .   Dental Screening: Recommended annual dental exams for proper oral hygiene  Community Resource Referral / Chronic Care Management: CRR required this visit?  No   CCM required this visit?  No      Plan:     I have personally reviewed and noted the following in the patient's chart:   Medical and social history Use of alcohol, tobacco or illicit drugs  Current medications and supplements including opioid prescriptions.  Functional ability and status Nutritional status Physical activity Advanced directives List of other physicians Hospitalizations, surgeries, and ER visits in previous 12 months Vitals Screenings to include cognitive, depression, and  falls Referrals and appointments  In addition, I have reviewed and discussed with patient certain preventive protocols, quality metrics, and best practice recommendations. A written personalized care plan for preventive services as well as general preventive health recommendations were provided to patient.     Sheral Flow, LPN   [id number]   Nurse Notes: n/a

## 2021-01-31 NOTE — Patient Instructions (Signed)
Paige [name] , Thank you for taking time to come for your Medicare Wellness Visit. I appreciate your ongoing commitment to your health goals. Please review the following plan we discussed and let me know if I can assist you in the future.   Screening recommendations/referrals: Colonoscopy: 09/30/2016; due every 5 years Mammogram: 02/03/2020; due every year Bone Density: 09/04/2020; due every 5 years (normal) Recommended yearly ophthalmology/optometry visit for glaucoma screening and checkup Recommended yearly dental visit for hygiene and checkup  Vaccinations: Influenza vaccine: 06/16/2020 Pneumococcal vaccine: 05/25/2015, 07/18/2016 Tdap vaccine: 02/10/2019; due every 10 years Shingles vaccine: never done   Covid-19: 09/22/2019, 10/13/2019, 06/02/2020  Advanced directives: Advance directive discussed with you today. Even though you declined this today please call our office should you change your mind and we can give you the proper paperwork for you to fill out.  Conditions/risks identified: Start exercise program, watch what I eat and continue to be independent.  Next appointment: Please schedule your next Medicare Wellness Visit with your Nurse Health Advisor in 1 year by calling [phone].   Preventive Care 27 Years and Older, Female Preventive care refers to lifestyle choices and visits with your health care provider that can promote health and wellness. What does preventive care include? A yearly physical exam. This is also called an annual well check. Dental exams once or twice a year. Routine eye exams. Ask your health care provider how often you should have your eyes checked. Personal lifestyle choices, including: Daily care of your teeth and gums. Regular physical activity. Eating a healthy diet. Avoiding tobacco and drug use. Limiting alcohol use. Practicing safe sex. Taking low-dose aspirin every day. Taking vitamin and mineral supplements as recommended by your health  care provider. What happens during an annual well check? The services and screenings done by your health care provider during your annual well check will depend on your age, overall health, lifestyle risk factors, and family history of disease. Counseling  Your health care provider may ask you questions about your: Alcohol use. Tobacco use. Drug use. Emotional well-being. Home and relationship well-being. Sexual activity. Eating habits. History of falls. Memory and ability to understand (cognition). Work and work Statistician. Reproductive health. Screening  You may have the following tests or measurements: Height, weight, and BMI. Blood pressure. Lipid and cholesterol levels. These may be checked every 5 years, or more frequently if you are over 63 years old. Skin check. Lung cancer screening. You may have this screening every year starting at age 49 if you have a 30-pack-year history of smoking and currently smoke or have quit within the past 15 years. Fecal occult blood test (FOBT) of the stool. You may have this test every year starting at age 11. Flexible sigmoidoscopy or colonoscopy. You may have a sigmoidoscopy every 5 years or a colonoscopy every 10 years starting at age 51. Hepatitis C blood test. Hepatitis B blood test. Sexually transmitted disease (STD) testing. Diabetes screening. This is done by checking your blood sugar (glucose) after you have not eaten for a while (fasting). You may have this done every 1-3 years. Bone density scan. This is done to screen for osteoporosis. You may have this done starting at age 85. Mammogram. This may be done every 1-2 years. Talk to your health care provider about how often you should have regular mammograms. Talk with your health care provider about your test results, treatment options, and if necessary, the need for more tests. Vaccines  Your health care provider may recommend  certain vaccines, such as: Influenza vaccine. This is  recommended every year. Tetanus, diphtheria, and acellular pertussis (Tdap, Td) vaccine. You may need a Td booster every 10 years. Zoster vaccine. You may need this after age 26. Pneumococcal 13-valent conjugate (PCV13) vaccine. One dose is recommended after age 8. Pneumococcal polysaccharide (PPSV23) vaccine. One dose is recommended after age 70. Talk to your health care provider about which screenings and vaccines you need and how often you need them. This information is not intended to replace advice given to you by your health care provider. Make sure you discuss any questions you have with your health care provider. Document Released: 08/31/2015 Document Revised: 04/23/2016 Document Reviewed: 06/05/2015 Elsevier Interactive Patient Education  2017 [employer] Prevention in the Home Falls can cause injuries. They can happen to people of all ages. There are many things you can do to make your home safe and to help prevent falls. What can I do on the outside of my home? Regularly fix the edges of walkways and driveways and fix any cracks. Remove anything that might make you trip as you walk through a door, such as a raised step or threshold. Trim any bushes or trees on the path to your home. Use bright outdoor lighting. Clear any walking paths of anything that might make someone trip, such as rocks or tools. Regularly check to see if handrails are loose or broken. Make sure that both sides of any steps have handrails. Any raised decks and porches should have guardrails on the edges. Have any leaves, snow, or ice cleared regularly. Use sand or salt on walking paths during winter. Clean up any spills in your garage right away. This includes oil or grease spills. What can I do in the bathroom? Use night lights. Install grab bars by the toilet and in the tub and shower. Do not use towel bars as grab bars. Use non-skid mats or decals in the tub or shower. If you need to sit down in  the shower, use a plastic, non-slip stool. Keep the floor dry. Clean up any water that spills on the floor as soon as it happens. Remove soap buildup in the tub or shower regularly. Attach bath mats securely with double-sided non-slip rug tape. Do not have throw rugs and other things on the floor that can make you trip. What can I do in the bedroom? Use night lights. Make sure that you have a light by your bed that is easy to reach. Do not use any sheets or blankets that are too big for your bed. They should not hang down onto the floor. Have a firm chair that has side arms. You can use this for support while you get dressed. Do not have throw rugs and other things on the floor that can make you trip. What can I do in the kitchen? Clean up any spills right away. Avoid walking on wet floors. Keep items that you use a lot in easy-to-reach places. If you need to reach something above you, use a strong step stool that has a grab bar. Keep electrical cords out of the way. Do not use floor polish or wax that makes floors slippery. If you must use wax, use non-skid floor wax. Do not have throw rugs and other things on the floor that can make you trip. What can I do with my stairs? Do not leave any items on the stairs. Make sure that there are handrails on both sides of the  stairs and use them. Fix handrails that are broken or loose. Make sure that handrails are as long as the stairways. Check any carpeting to make sure that it is firmly attached to the stairs. Fix any carpet that is loose or worn. Avoid having throw rugs at the top or bottom of the stairs. If you do have throw rugs, attach them to the floor with carpet tape. Make sure that you have a light switch at the top of the stairs and the bottom of the stairs. If you do not have them, ask someone to add them for you. What else can I do to help prevent falls? Wear shoes that: Do not have high heels. Have rubber bottoms. Are comfortable  and fit you well. Are closed at the toe. Do not wear sandals. If you use a stepladder: Make sure that it is fully opened. Do not climb a closed stepladder. Make sure that both sides of the stepladder are locked into place. Ask someone to hold it for you, if possible. Clearly mark and make sure that you can see: Any grab bars or handrails. First and last steps. Where the edge of each step is. Use tools that help you move around (mobility aids) if they are needed. These include: Canes. Walkers. Scooters. Crutches. Turn on the lights when you go into a dark area. Replace any light bulbs as soon as they burn out. Set up your furniture so you have a clear path. Avoid moving your furniture around. If any of your floors are uneven, fix them. If there are any pets around you, be aware of where they are. Review your medicines with your doctor. Some medicines can make you feel dizzy. This can increase your chance of falling. Ask your doctor what other things that you can do to help prevent falls. This information is not intended to replace advice given to you by your health care provider. Make sure you discuss any questions you have with your health care provider. Document Released: 05/31/2009 Document Revised: 01/10/2016 Document Reviewed: 09/08/2014 Elsevier Interactive Patient Education  2017 [employer].

## 2021-02-20 ENCOUNTER — Ambulatory Visit
Admission: RE | Admit: 2021-02-20 | Discharge: 2021-02-20 | Disposition: A | Discharge status: Home / Self Care | Payer: PPO | Source: Ambulatory Visit | Attending: Internal Medicine | Admitting: Internal Medicine

## 2021-02-20 ENCOUNTER — Other Ambulatory Visit: Payer: Self-pay

## 2021-02-20 DIAGNOSIS — Z1231 Encounter for screening mammogram for malignant neoplasm of breast: Secondary | ICD-10-CM | POA: Diagnosis not present

## 2021-02-25 ENCOUNTER — Telehealth: Payer: Self-pay | Admitting: Internal Medicine

## 2021-02-25 ENCOUNTER — Other Ambulatory Visit: Payer: Self-pay | Admitting: Internal Medicine

## 2021-02-25 DIAGNOSIS — R928 Other abnormal and inconclusive findings on diagnostic imaging of breast: Secondary | ICD-10-CM

## 2021-02-25 NOTE — Telephone Encounter (Signed)
    Patient calling to discuss mammogram results

## 2021-02-26 NOTE — Telephone Encounter (Signed)
[employer]  Reviewed her mammogram and discussed why additional imaging is needed.  She will call to schedule her fasting blood work for the end of July/beginning of August.

## 2021-03-12 ENCOUNTER — Other Ambulatory Visit (INDEPENDENT_AMBULATORY_CARE_PROVIDER_SITE_OTHER): Payer: PPO

## 2021-03-12 ENCOUNTER — Other Ambulatory Visit: Payer: Self-pay

## 2021-03-12 DIAGNOSIS — E782 Mixed hyperlipidemia: Secondary | ICD-10-CM

## 2021-03-12 LAB — LIPID PANEL
Cholesterol: 166 mg/dL (ref 0–200)
HDL: 59.5 mg/dL (ref >39.00)
LDL Cholesterol: 79 mg/dL (ref 0–99)
NonHDL: 106.83
Total CHOL/HDL Ratio: 3
Triglycerides: 138 mg/dL (ref 0.0–149.0)
VLDL: 27.6 mg/dL (ref 0.0–40.0)

## 2021-03-12 LAB — HEPATIC FUNCTION PANEL
ALT: 7 U/L (ref 0–35)
AST: 17 U/L (ref 0–37)
Albumin: 4.2 g/dL (ref 3.5–5.2)
Alkaline Phosphatase: 73 U/L (ref 39–117)
Bilirubin, Direct: 0.1 mg/dL (ref 0.0–0.3)
Total Bilirubin: 0.5 mg/dL (ref 0.2–1.2)
Total Protein: 7.3 g/dL (ref 6.0–8.3)

## 2021-03-15 ENCOUNTER — Other Ambulatory Visit: Payer: Self-pay

## 2021-03-15 ENCOUNTER — Ambulatory Visit
Admission: RE | Admit: 2021-03-15 | Discharge: 2021-03-15 | Disposition: A | Discharge status: Home / Self Care | Payer: PPO | Source: Ambulatory Visit | Attending: Internal Medicine | Admitting: Internal Medicine

## 2021-03-15 ENCOUNTER — Ambulatory Visit: Payer: PPO

## 2021-03-15 DIAGNOSIS — R928 Other abnormal and inconclusive findings on diagnostic imaging of breast: Secondary | ICD-10-CM | POA: Diagnosis not present

## 2021-03-15 DIAGNOSIS — R922 Inconclusive mammogram: Secondary | ICD-10-CM | POA: Diagnosis not present

## 2021-03-19 NOTE — Progress Notes (Signed)
[facility] Return Visit  SUBJECTIVE  History of Present Illness: Paige [name] is a 70 y.o. female seen in follow-up for overactive bladder. Has been doing well on Trospium '60mg'$  daily.   Had one episode of leakage that wet her clothes but that has not happened in a while. She is seeing enough of an improvement that she wants to continue the medication.  Has some dry mouth symptoms but has not been bothersome.  Has always had dry eyes and this has not worsened. Daytime voids are normal.   Past Medical History: Patient  has a past medical history of ADD (attention deficit disorder), Anxiety, Arthritis, Carpal tunnel syndrome, Colon polyp, Constipation, Depression, Gallstones, GERD (gastroesophageal reflux disease), Hypertension, Status post dilation of esophageal narrowing, and Thyroid disease.   Past Surgical History: She  has a past surgical history that includes Cholecystectomy; Abdominal hysterectomy; Tubal ligation; fx leg; Nasal sinus surgery; Closed reduction hand fracture; Carpal tunnel release (Left); Tibia fracture surgery; and Shoulder surgery.   Medications: She has a current medication list which includes the following prescription(s): bisoprolol-hydrochlorothiazide, escitalopram, eszopiclone, euthyrox, rosuvastatin, and trospium chloride.   Allergies: Patient is allergic to asa [aspirin], augmentin [amoxicillin-pot clavulanate], and morphine and related.   Social History: Patient  reports that she has never smoked. She has never used smokeless tobacco. She reports that she does not drink alcohol and does not use drugs.      OBJECTIVE     Physical Exam: Vitals:   03/20/21 1041  BP: 130/73  Pulse: 85  Weight: 203 lb (92.1 kg)  Height: '5\' 2"'$  (1.575 m)    Gen: No apparent distress, A&O x 3.  Detailed Urogynecologic Evaluation:  Deferred.    ASSESSMENT AND PLAN    Paige [name] is a 70 y.o. with:  1. Overactive bladder     - She is doing  well on Trospium '60mg'$ . She would like to continue this for now. We discussed eventually weaning off the medication to reassess need for continuing medication- will readress at next visit.    Follow up 6 months  Jaquita Folds, MD  Time spent: I spent 20 minutes dedicated to the care of this patient on the date of this encounter to include pre-visit review of records, face-to-face time with the patient and post visit documentation and ordering medication/ testing.

## 2021-03-20 ENCOUNTER — Encounter: Payer: Self-pay | Admitting: Obstetrics and Gynecology

## 2021-03-20 ENCOUNTER — Other Ambulatory Visit: Payer: Self-pay

## 2021-03-20 ENCOUNTER — Ambulatory Visit (INDEPENDENT_AMBULATORY_CARE_PROVIDER_SITE_OTHER): Payer: PPO | Admitting: Obstetrics and Gynecology

## 2021-03-20 DIAGNOSIS — N3281 Overactive bladder: Secondary | ICD-10-CM | POA: Diagnosis not present

## 2021-03-20 MED ORDER — TROSPIUM CHLORIDE ER 60 MG PO CP24: 1.0000 | ORAL_CAPSULE | Freq: Every day | ORAL | 5 refills | Status: DC

## 2021-04-18 ENCOUNTER — Other Ambulatory Visit: Payer: Self-pay | Admitting: Internal Medicine

## 2021-05-09 ENCOUNTER — Ambulatory Visit: Payer: PPO

## 2021-05-13 DIAGNOSIS — L570 Actinic keratosis: Secondary | ICD-10-CM | POA: Diagnosis not present

## 2021-05-13 DIAGNOSIS — D485 Neoplasm of uncertain behavior of skin: Secondary | ICD-10-CM | POA: Diagnosis not present

## 2021-05-13 DIAGNOSIS — L720 Epidermal cyst: Secondary | ICD-10-CM | POA: Diagnosis not present

## 2021-05-13 DIAGNOSIS — Z85828 Personal history of other malignant neoplasm of skin: Secondary | ICD-10-CM | POA: Diagnosis not present

## 2021-05-13 DIAGNOSIS — L821 Other seborrheic keratosis: Secondary | ICD-10-CM | POA: Diagnosis not present

## 2021-05-13 DIAGNOSIS — C44519 Basal cell carcinoma of skin of other part of trunk: Secondary | ICD-10-CM | POA: Diagnosis not present

## 2021-05-13 DIAGNOSIS — L814 Other melanin hyperpigmentation: Secondary | ICD-10-CM | POA: Diagnosis not present

## 2021-05-16 ENCOUNTER — Ambulatory Visit: Payer: PPO | Attending: Internal Medicine

## 2021-05-16 DIAGNOSIS — Z23 Encounter for immunization: Secondary | ICD-10-CM

## 2021-05-17 NOTE — Progress Notes (Signed)
   Covid-19 Vaccination Clinic  Name:  Paige [name]    MRN: [id number] DOB: 16-Mar-1951  05/17/2021  [name] was observed post Covid-19 immunization for 15 minutes without incident. She was provided with Vaccine Information Sheet and instruction to access the V-Safe system.   [name] was instructed to call 911 with any severe reactions post vaccine: Difficulty breathing  Swelling of face and throat  A fast heartbeat  A bad rash all over body  Dizziness and weakness

## 2021-05-28 ENCOUNTER — Other Ambulatory Visit (HOSPITAL_BASED_OUTPATIENT_CLINIC_OR_DEPARTMENT_OTHER): Payer: Self-pay

## 2021-05-28 MED ORDER — COVID-19MRNA BIVAL VACC PFIZER 30 MCG/0.3ML IM SUSP
INTRAMUSCULAR | 0 refills | Status: DC
  Filled 2021-05-28: qty 0.3, 1d supply, fill #0

## 2021-06-08 ENCOUNTER — Ambulatory Visit (INDEPENDENT_AMBULATORY_CARE_PROVIDER_SITE_OTHER): Payer: PPO

## 2021-06-08 ENCOUNTER — Other Ambulatory Visit: Payer: Self-pay

## 2021-06-08 DIAGNOSIS — Z23 Encounter for immunization: Secondary | ICD-10-CM | POA: Diagnosis not present

## 2021-06-21 ENCOUNTER — Ambulatory Visit: Payer: PPO

## 2021-06-24 ENCOUNTER — Ambulatory Visit: Payer: PPO

## 2021-06-24 ENCOUNTER — Other Ambulatory Visit: Payer: Self-pay

## 2021-06-24 DIAGNOSIS — Z111 Encounter for screening for respiratory tuberculosis: Secondary | ICD-10-CM

## 2021-06-24 NOTE — Progress Notes (Unsigned)
Patient seen in office for tb skin test. She will return on Wednesday for reading.

## 2021-06-26 ENCOUNTER — Ambulatory Visit (INDEPENDENT_AMBULATORY_CARE_PROVIDER_SITE_OTHER): Payer: PPO

## 2021-06-26 ENCOUNTER — Other Ambulatory Visit: Payer: Self-pay

## 2021-06-26 DIAGNOSIS — Z111 Encounter for screening for respiratory tuberculosis: Secondary | ICD-10-CM | POA: Diagnosis not present

## 2021-06-26 LAB — TB SKIN TEST
Induration: 0 mm
TB Skin Test: NEGATIVE

## 2021-07-02 ENCOUNTER — Telehealth (HOSPITAL_BASED_OUTPATIENT_CLINIC_OR_DEPARTMENT_OTHER): Payer: PPO | Admitting: Psychiatry

## 2021-07-02 ENCOUNTER — Other Ambulatory Visit: Payer: Self-pay

## 2021-07-02 DIAGNOSIS — F325 Major depressive disorder, single episode, in full remission: Secondary | ICD-10-CM | POA: Diagnosis not present

## 2021-07-02 MED ORDER — ESZOPICLONE 3 MG PO TABS: 3.0000 mg | ORAL_TABLET | Freq: Every evening | ORAL | 5 refills | Status: DC | PRN

## 2021-07-02 MED ORDER — ESCITALOPRAM OXALATE 20 MG PO TABS: 20.0000 mg | ORAL_TABLET | Freq: Every day | ORAL | 7 refills | Status: DC
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
## 2021-07-21 ENCOUNTER — Other Ambulatory Visit: Payer: Self-pay | Admitting: Internal Medicine

## 2021-07-23 ENCOUNTER — Encounter: Payer: Self-pay | Admitting: Internal Medicine

## 2021-07-23 NOTE — Patient Instructions (Addendum)
Blood work was ordered.     Medications changes include :  none     Please followup in 6 months   Health Maintenance, Female Adopting a healthy lifestyle and getting preventive care are important in promoting health and wellness. Ask your health care provider about: The right schedule for you to have regular tests and exams. Things you can do on your own to prevent diseases and keep yourself healthy. What should I know about diet, weight, and exercise? Eat a healthy diet  Eat a diet that includes plenty of vegetables, fruits, low-fat dairy products, and lean protein. Do not eat a lot of foods that are high in solid fats, added sugars, or sodium. Maintain a healthy weight Body mass index (BMI) is used to identify weight problems. It estimates body fat based on height and weight. Your health care provider can help determine your BMI and help you achieve or maintain a healthy weight. Get regular exercise Get regular exercise. This is one of the most important things you can do for your health. Most adults should: Exercise for at least 150 minutes each week. The exercise should increase your heart rate and make you sweat (moderate-intensity exercise). Do strengthening exercises at least twice a week. This is in addition to the moderate-intensity exercise. Spend less time sitting. Even light physical activity can be beneficial. Watch cholesterol and blood lipids Have your blood tested for lipids and cholesterol at 71 years of age, then have this test every 5 years. Have your cholesterol levels checked more often if: Your lipid or cholesterol levels are high. You are older than 71 years of age. You are at high risk for heart disease. What should I know about cancer screening? Depending on your health history and family history, you may need to have cancer screening at various ages. This may include screening for: Breast cancer. Cervical cancer. Colorectal cancer. Skin cancer. Lung  cancer. What should I know about heart disease, diabetes, and high blood pressure? Blood pressure and heart disease High blood pressure causes heart disease and increases the risk of stroke. This is more likely to develop in people who have high blood pressure readings or are overweight. Have your blood pressure checked: Every 3-5 years if you are 23-34 years of age. Every year if you are 1 years old or older. Diabetes Have regular diabetes screenings. This checks your fasting blood sugar level. Have the screening done: Once every three years after age 12 if you are at a normal weight and have a low risk for diabetes. More often and at a younger age if you are overweight or have a high risk for diabetes. What should I know about preventing infection? Hepatitis B If you have a higher risk for hepatitis B, you should be screened for this virus. Talk with your health care provider to find out if you are at risk for hepatitis B infection. Hepatitis C Testing is recommended for: Everyone born from [birth year] through 1966. Anyone with known risk factors for hepatitis C. Sexually transmitted infections (STIs) Get screened for STIs, including gonorrhea and chlamydia, if: You are sexually active and are younger than 71 years of age. You are older than 71 years of age and your health care provider tells you that you are at risk for this type of infection. Your sexual activity has changed since you were last screened, and you are at increased risk for chlamydia or gonorrhea. Ask your health care provider if you are at risk. Ask  your health care provider about whether you are at high risk for HIV. Your health care provider may recommend a prescription medicine to help prevent HIV infection. If you choose to take medicine to prevent HIV, you should first get tested for HIV. You should then be tested every 3 months for as long as you are taking the medicine. Pregnancy If you are about to stop having your  period (premenopausal) and you may become pregnant, seek counseling before you get pregnant. Take 400 to 800 micrograms (mcg) of folic acid every day if you become pregnant. Ask for birth control (contraception) if you want to prevent pregnancy. Osteoporosis and menopause Osteoporosis is a disease in which the bones lose minerals and strength with aging. This can result in bone fractures. If you are 82 years old or older, or if you are at risk for osteoporosis and fractures, ask your health care provider if you should: Be screened for bone loss. Take a calcium or vitamin D supplement to lower your risk of fractures. Be given hormone replacement therapy (HRT) to treat symptoms of menopause. Follow these instructions at home: Alcohol use Do not drink alcohol if: Your health care provider tells you not to drink. You are pregnant, may be pregnant, or are planning to become pregnant. If you drink alcohol: Limit how much you have to: 0-1 drink a day. Know how much alcohol is in your drink. In the [place] one drink equals one 12 oz bottle of beer (355 mL), one 5 oz glass of wine (148 mL), or one 1 oz glass of hard liquor (44 mL). Lifestyle Do not use any products that contain nicotine or tobacco. These products include cigarettes, chewing tobacco, and vaping devices, such as e-cigarettes. If you need help quitting, ask your health care provider. Do not use street drugs. Do not share needles. Ask your health care provider for help if you need support or information about quitting drugs. General instructions Schedule regular health, dental, and eye exams. Stay current with your vaccines. Tell your health care provider if: You often feel depressed. You have ever been abused or do not feel safe at home. Summary Adopting a healthy lifestyle and getting preventive care are important in promoting health and wellness. Follow your health care provider's instructions about healthy diet, exercising, and  getting tested or screened for diseases. Follow your health care provider's instructions on monitoring your cholesterol and blood pressure. This information is not intended to replace advice given to you by your health care provider. Make sure you discuss any questions you have with your health care provider. Document Revised: 12/24/2020 Document Reviewed: 12/24/2020 Elsevier Patient Education  [place].

## 2021-07-23 NOTE — Progress Notes (Signed)
Subjective:    Patient ID: Paige [name], female    DOB: 07/09/1951, 70 y.o.   MRN: [id number]   This visit occurred during the SARS-CoV-2 public health emergency.  Safety protocols were in place, including screening questions prior to the visit, additional usage of staff PPE, and extensive cleaning of exam room while observing appropriate contact time as indicated for disinfecting solutions.    HPI She is here for a physical exam.   She has no concerns.  Medications and allergies reviewed with patient and updated if appropriate.  Patient Active Problem List   Diagnosis Date Noted   Pseudogout of left wrist 01/21/2021   Overactive bladder 10/05/2020   Deviated septum 02/21/2019   Right shoulder pain 02/14/2019   Nasal fracture 02/13/2019   Prediabetes 07/05/2018   Popping sound of knee joint 07/05/2018   Chronic bilateral low back pain without sciatica 07/30/2017   Hyperlipidemia 07/04/2017   Piriformis syndrome of left side 04/29/2016   Left hip pain 03/27/2016   Lumbar radiculopathy 03/19/2016   Neck pain on right side 01/16/2016   AK (actinic keratosis) 10/19/2012   CARCINOMA, BASAL CELL, BACK 11/16/2009   ANXIETY DISORDER, GENERALIZED 07/30/2009   BACK PAIN WITH RADICULOPATHY 05/28/2009   OSTEOARTHRITIS 12/20/2007   Hypothyroidism 06/08/2007   Attention deficit hyperactivity disorder (ADHD) 06/08/2007   Carpal tunnel syndrome 06/08/2007   Essential hypertension 06/08/2007    Current Outpatient Medications on File Prior to Visit  Medication Sig Dispense Refill   bisoprolol-hydrochlorothiazide (ZIAC) 5-6.25 MG tablet Take 1 tablet by mouth once daily 90 tablet 0   escitalopram (LEXAPRO) 20 MG tablet Take 1 tablet (20 mg total) by mouth daily. 30 tablet 7   Eszopiclone 3 MG TABS Take 1 tablet (3 mg total) by mouth at bedtime as needed. Take immediately before bedtime 30 tablet 5   levothyroxine (SYNTHROID) 112 MCG tablet Take 1 tablet by mouth once daily 90  tablet 0   rosuvastatin (CRESTOR) 10 MG tablet Take 1 tablet (10 mg total) by mouth daily. 90 tablet 3   Trospium Chloride 60 MG CP24 Take 1 capsule (60 mg total) by mouth daily. 30 capsule 5   No current facility-administered medications on file prior to visit.    Past Medical History:  Diagnosis Date   ADD (attention deficit disorder)    Pt has hard time focusing   Anxiety    Arthritis    Carpal tunnel syndrome    Colon polyp    Constipation    Depression    Gallstones    GERD (gastroesophageal reflux disease)    Hypertension    Status post dilation of esophageal narrowing    Thyroid disease     Past Surgical History:  Procedure Laterality Date   ABDOMINAL HYSTERECTOMY     CARPAL TUNNEL RELEASE Left    CHOLECYSTECTOMY     CLOSED REDUCTION HAND FRACTURE     right hand   fx leg     NASAL SINUS SURGERY     SHOULDER SURGERY     reattac tendon; right shoulder   TIBIA FRACTURE SURGERY     steel rod right leg   TUBAL LIGATION      Social History   Socioeconomic History   Marital status: Widowed    Spouse name: Not on file   Number of children: 2   Years of education: Not on file   Highest education level: High school graduate  Occupational History   Occupation: retired  Employer: UNEMPLOYED   Occupation: Conservation officer, historic buildings: [place]  Tobacco Use   Smoking status: Never   Smokeless tobacco: Never  Vaping Use   Vaping Use: Never used  Substance and Sexual Activity   Alcohol use: No   Drug use: No   Sexual activity: Not Currently  Other Topics Concern   Not on file  Social History Narrative   Works at [employer] - husband died of lung cancer      No regular exercise   Social Determinants of Health   Financial Resource Strain: Low Risk    Difficulty of Paying Living Expenses: Not hard at all  Food Insecurity: No Food Insecurity   Worried About Charity fundraiser in the Last Year: Never true   Academic librarian in the Last Year: Never true  Transportation Needs: No Transportation Needs   Lack of Transportation (Medical): No   Lack of Transportation (Non-Medical): No  Physical Activity: Insufficiently Active   Days of Exercise per Week: 6 days   Minutes of Exercise per Session: 10 min  Stress: No Stress Concern Present   Feeling of Stress : Not at all  Social Connections: Moderately Integrated   Frequency of Communication with Friends and Family: More than three times a week   Frequency of Social Gatherings with Friends and Family: More than three times a week   Attends Religious Services: More than 4 times per year   Active Member of Clubs or Organizations: No   Attends Music therapist: More than 4 times per year   Marital Status: Widowed    Family History  Problem Relation Age of Onset   Hyperlipidemia Mother    Colon cancer Mother        59   Lung cancer Father    Cancer Paternal Grandfather        mets   Leukemia Maternal Aunt     Review of Systems  Constitutional:  Negative for chills and fever.  HENT:  Positive for hearing loss.   Eyes:  Negative for visual disturbance.  Respiratory:  Negative for cough, shortness of breath and wheezing.   Cardiovascular:  Positive for leg swelling. Negative for chest pain and palpitations.  Gastrointestinal:  Negative for abdominal pain, blood in stool, constipation, diarrhea and nausea.       No gerd  Genitourinary:  Negative for dysuria.  Musculoskeletal:  Negative for arthralgias and back pain.  Skin:  Negative for rash.  Neurological:  Positive for dizziness (occ) and headaches (right sided or left sided at times).  Psychiatric/Behavioral:  Negative for dysphoric mood. The patient is nervous/anxious.       Objective:   Vitals:   07/24/21 1459  BP: 130/84  Pulse: 74  Temp: 98 F (36.7 C)  SpO2: 97%   Filed Weights   07/24/21 1459  Weight: 207 lb (93.9 kg)   Body mass index is 37.86 kg/m.  BP Readings  from Last 3 Encounters:  07/24/21 130/84  03/20/21 130/73  01/31/21 124/80    Wt Readings from Last 3 Encounters:  07/24/21 207 lb (93.9 kg)  03/20/21 203 lb (92.1 kg)  01/31/21 203 lb 9.6 oz (92.4 kg)    Depression screen [facility] 2/9 07/24/2021 01/31/2021 01/21/2021 01/21/2021 01/11/2020  Decreased Interest 1 1 1 1 1   Down, Depressed, Hopeless 1 2 2 2  0  PHQ - 2 Score 2 3 3 3  1  Altered sleeping 0 - 1 1 1   Tired, decreased energy 1 - 1 1 3   Change in appetite 2 - 3 3 2   Feeling bad or failure about yourself  1 - 3 3 1   Trouble concentrating 0 - 0 0 0  Moving slowly or fidgety/restless 1 - 2 2 3   Suicidal thoughts 0 - 0 0 0  PHQ-9 Score 7 - 13 13 11   Difficult doing work/chores Not difficult at all - Somewhat difficult Somewhat difficult -     GAD 7 : Generalized Anxiety Score 07/24/2021 01/21/2021 01/11/2020  Nervous, Anxious, on Edge 1 1 1   Control/stop worrying 1 1 1   Worry too much - different things 1 1 1   Trouble relaxing 1 1 3   Restless 0 1 1  Easily annoyed or irritable 0 1 1  Afraid - awful might happen 1 2 1   Total GAD 7 Score 5 8 9   Anxiety Difficulty Not difficult at all Somewhat difficult -       Physical Exam Constitutional: She appears well-developed and well-nourished. No distress.  HENT:  Head: Normocephalic and atraumatic.  Right Ear: External ear normal. Normal ear canal and TM Left Ear: External ear normal.  Normal ear canal and TM Mouth/Throat: Oropharynx is clear and moist.  Eyes: Conjunctivae and EOM are normal.  Neck: Neck supple. No tracheal deviation present. No thyromegaly present.  No carotid bruit  Cardiovascular: Normal rate, regular rhythm and normal heart sounds.   No murmur heard.  No edema. Pulmonary/Chest: Effort normal and breath sounds normal. No respiratory distress. She has no wheezes. She has no rales.  Breast: deferred   Abdominal: Soft. She exhibits no distension. There is no tenderness.  Lymphadenopathy: She has no cervical  adenopathy.  Skin: Skin is warm and dry. She is not diaphoretic.  Psychiatric: She has a normal mood and affect. Her behavior is normal.     Lab Results  Component Value Date   WBC 7.8 08/16/2020   HGB 12.4 08/16/2020   HCT 36.5 08/16/2020   PLT 254.0 08/16/2020   GLUCOSE 91 01/21/2021   CHOL 166 03/12/2021   TRIG 138.0 03/12/2021   HDL 59.50 03/12/2021   LDLDIRECT 148.0 07/03/2017   LDLCALC 79 03/12/2021   ALT 7 03/12/2021   AST 17 03/12/2021   NA 138 01/21/2021   K 4.0 01/21/2021   CL 100 01/21/2021   CREATININE 0.62 01/21/2021   BUN 19 01/21/2021   CO2 23 01/21/2021   TSH 0.50 01/21/2021   HGBA1C 6.5 01/21/2021         Assessment & Plan:   Physical exam: Screening blood work  ordered Exercise  walks sometimes - goal is 30 min 5 times a week Weight  encouraged weight loss Substance abuse  none   Reviewed recommended immunizations.   Health Maintenance  Topic Date Due   Zoster Vaccines- Shingrix (1 of 2) Never done   COLONOSCOPY (Pts 45-90yrs Insurance coverage will need to be confirmed)  09/30/2021   MAMMOGRAM  02/21/2023   DEXA SCAN  09/04/2025   TETANUS/TDAP  02/09/2029   Pneumonia Vaccine 84+ Years old  Completed   INFLUENZA VACCINE  Completed   COVID-19 Vaccine  Completed   Hepatitis C Screening  Completed   HPV VACCINES  Aged Out          See Problem List for Assessment and Plan of chronic medical problems.

## 2021-07-24 ENCOUNTER — Other Ambulatory Visit: Payer: Self-pay

## 2021-07-24 ENCOUNTER — Ambulatory Visit (INDEPENDENT_AMBULATORY_CARE_PROVIDER_SITE_OTHER): Payer: PPO | Admitting: Internal Medicine

## 2021-07-24 VITALS — BP 130/84 | HR 74 | Temp 98.0°F | Ht 62.0 in | Wt 207.0 lb

## 2021-07-24 DIAGNOSIS — N3281 Overactive bladder: Secondary | ICD-10-CM

## 2021-07-24 DIAGNOSIS — F411 Generalized anxiety disorder: Secondary | ICD-10-CM

## 2021-07-24 DIAGNOSIS — Z0000 Encounter for general adult medical examination without abnormal findings: Secondary | ICD-10-CM

## 2021-07-24 DIAGNOSIS — Z1331 Encounter for screening for depression: Secondary | ICD-10-CM

## 2021-07-24 DIAGNOSIS — E782 Mixed hyperlipidemia: Secondary | ICD-10-CM | POA: Diagnosis not present

## 2021-07-24 DIAGNOSIS — R7303 Prediabetes: Secondary | ICD-10-CM | POA: Diagnosis not present

## 2021-07-24 DIAGNOSIS — E039 Hypothyroidism, unspecified: Secondary | ICD-10-CM | POA: Diagnosis not present

## 2021-07-24 DIAGNOSIS — I10 Essential (primary) hypertension: Secondary | ICD-10-CM

## 2021-07-24 LAB — LIPID PANEL
Cholesterol: 249 mg/dL — ABNORMAL HIGH (ref 0–200)
HDL: 58.9 mg/dL (ref >39.00)
LDL Cholesterol: 156 mg/dL — ABNORMAL HIGH (ref 0–99)
NonHDL: 189.61
Total CHOL/HDL Ratio: 4
Triglycerides: 169 mg/dL — ABNORMAL HIGH (ref 0.0–149.0)
VLDL: 33.8 mg/dL (ref 0.0–40.0)

## 2021-07-24 LAB — TSH: TSH: 1.03 u[IU]/mL (ref 0.35–5.50)

## 2021-07-24 LAB — COMPREHENSIVE METABOLIC PANEL
ALT: 10 U/L (ref 0–35)
AST: 22 U/L (ref 0–37)
Albumin: 4.2 g/dL (ref 3.5–5.2)
Alkaline Phosphatase: 82 U/L (ref 39–117)
BUN: 16 mg/dL (ref 6–23)
CO2: 32 mEq/L (ref 19–32)
Calcium: 10 mg/dL (ref 8.4–10.5)
Chloride: 101 mEq/L (ref 96–112)
Creatinine, Ser: 0.68 mg/dL (ref 0.40–1.20)
GFR: 87.63 mL/min (ref >60.00)
Glucose, Bld: 95 mg/dL (ref 70–99)
Potassium: 3.9 mEq/L (ref 3.5–5.1)
Sodium: 139 mEq/L (ref 135–145)
Total Bilirubin: 0.4 mg/dL (ref 0.2–1.2)
Total Protein: 7.5 g/dL (ref 6.0–8.3)

## 2021-07-24 LAB — CBC WITH DIFFERENTIAL/PLATELET
Basophils Absolute: 0 10*3/uL (ref 0.0–0.1)
Basophils Relative: 0.4 % (ref 0.0–3.0)
Eosinophils Absolute: 0.2 10*3/uL (ref 0.0–0.7)
Eosinophils Relative: 2.5 % (ref 0.0–5.0)
HCT: 36.8 % (ref 36.0–46.0)
Hemoglobin: 12.2 g/dL (ref 12.0–15.0)
Lymphocytes Relative: 24.6 % (ref 12.0–46.0)
Lymphs Abs: 2.1 10*3/uL (ref 0.7–4.0)
MCHC: 33.1 g/dL (ref 30.0–36.0)
MCV: 81.9 fl (ref 78.0–100.0)
Monocytes Absolute: 0.6 10*3/uL (ref 0.1–1.0)
Monocytes Relative: 6.7 % (ref 3.0–12.0)
Neutro Abs: 5.6 10*3/uL (ref 1.4–7.7)
Neutrophils Relative %: 65.8 % (ref 43.0–77.0)
Platelets: 218 10*3/uL (ref 150.0–400.0)
RBC: 4.49 Mil/uL (ref 3.87–5.11)
RDW: 14.6 % (ref 11.5–15.5)
WBC: 8.5 10*3/uL (ref 4.0–10.5)

## 2021-07-24 LAB — HEMOGLOBIN A1C: Hgb A1c MFr Bld: 6.4 % (ref 4.6–6.5)

## 2021-07-24 NOTE — Assessment & Plan Note (Signed)
Chronic on trospium  Management Dr Wannetta Sender

## 2021-07-24 NOTE — Assessment & Plan Note (Addendum)
Chronic Somewhat controlled On lexapro Management per Dr Casimiro Needle

## 2021-07-24 NOTE — Assessment & Plan Note (Addendum)
Chronic Check a1c Not exercising and not compliant with a diabetic diet Low sugar / carb diet Stressed regular exercise

## 2021-07-24 NOTE — Assessment & Plan Note (Signed)
Chronic Check lipid panel  Continue crestor 10 mg daily Regular exercise and healthy diet encouraged  

## 2021-07-24 NOTE — Assessment & Plan Note (Signed)
Chronic BP well controlled Continue ziac 5-6.25 mg daily cmp

## 2021-07-24 NOTE — Assessment & Plan Note (Signed)
Chronic  Clinically euthyroid Currently taking levothyroxine 112 mcg daily Check tsh  Titrate med dose if needed'

## 2021-07-26 MED ORDER — PRAVASTATIN SODIUM 10 MG PO TABS: 10.0000 mg | ORAL_TABLET | Freq: Every day | ORAL | 5 refills | Status: DC

## 2021-07-26 NOTE — Addendum Note (Signed)
Addended by: Binnie Rail on: 07/26/2021 01:28 PM   Modules accepted: Orders

## 2021-08-21 ENCOUNTER — Telehealth: Payer: PPO

## 2021-09-02 ENCOUNTER — Ambulatory Visit (INDEPENDENT_AMBULATORY_CARE_PROVIDER_SITE_OTHER): Payer: PPO

## 2021-09-02 DIAGNOSIS — I10 Essential (primary) hypertension: Secondary | ICD-10-CM

## 2021-09-02 DIAGNOSIS — R7303 Prediabetes: Secondary | ICD-10-CM

## 2021-09-02 DIAGNOSIS — E782 Mixed hyperlipidemia: Secondary | ICD-10-CM

## 2021-09-02 NOTE — Patient Instructions (Signed)
Visit Information  Following are the goals we discussed today:   Manage My Medicine   Timeframe:  Long-Range Goal Priority:  Medium Start Date:       01/21/21                      Expected End Date:     08/23/22                  Follow Up Date July 2023   - call for medicine refill 2 or 3 days before it runs out - call if I am sick and can't take my medicine - keep a list of all the medicines I take; vitamins and herbals too  -target a minimum of 150 minutes of moderate intensity exercise weekly -engage in dietary modifications by limiting carbs  -Get Shingrix at local pharmacy   Why is this important?   These steps will help you keep on track with your medicines.  Plan: Telephone follow up appointment with care management team member scheduled for:  6 months  The patient has been provided with contact information for the care management team and has been advised to call with any health related questions or concerns.   Tomasa Blase, PharmD Clinical Pharmacist, [facility]   Please call the care guide team at [phone] if you need to cancel or reschedule your appointment.   The patient verbalized understanding of instructions, educational materials, and care plan provided today and agreed to receive a mailed copy of patient instructions, educational materials, and care plan.

## 2021-09-02 NOTE — Progress Notes (Signed)
Chronic Care Management Pharmacy Note  09/02/2021 Name:  Paige [name] MRN:  [id number] DOB:  Apr 03, 1951  Summary: -since last CCM visit patient has started pravastatin 97m daily - taking for about 30 days, denies any AE since starting  -reports adherence to all of her medications  -BP at goal in office  -Notes that she has started naproxen 2259mprn for pains in back /hands   Recommendations/Changes made from today's visit: -Reviewed with patient using NSAIDS can cause BP elevations, recommended to use as needed, encouraged use of APAP for pain as well -Patient to continue current medications - to reach out should she have any AE from her medications - repeat lipid panel with next PCP appointment   Subjective: Paige NICOTRAs an [age].o. year old female who is a primary patient of Burns, StClaudina LickMD.  The CCM team was consulted for assistance with disease management and care coordination needs.    Engaged with patient by telephone for follow up visit in response to provider referral for pharmacy case management and/or care coordination services.   Consent to Services:  The patient was given the following information about Chronic Care Management services today, agreed to services, and gave verbal consent: 1. CCM service includes personalized support from designated clinical staff supervised by the primary care provider, including individualized plan of care and coordination with other care providers 2. 24/7 contact phone numbers for assistance for urgent and routine care needs. 3. Service will only be billed when office clinical staff spend 20 minutes or more in a month to coordinate care. 4. Only one practitioner may furnish and bill the service in a calendar month. 5.The patient may stop CCM services at any time (effective at the end of the month) by phone call to the office staff. 6. The patient will be responsible for cost sharing (co-pay) of up to 20% of the service fee (after  annual deductible is met). Patient agreed to services and consent obtained.  Patient Care Team: BuBinnie RailMD as PCP - General (Internal Medicine) PlNorma FredricksonMD as Consulting Physician (Psychiatry) GrRoseanne KaufmanMD as Consulting Physician (Orthopedic Surgery) DiCalvert CantorMD as Consulting Physician (Ophthalmology) FoCharlton HawsRPMirage [facility] LPs Pharmacist (Pharmacist)  Patient lives at home alone. Her husband passed about 12 years ago. She has children and grandchildren close by who she visits with often. 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 Since retiring she has less stress but is also less active and has gained 7+ lbs.  Recent office visits: 07/24/2021 - Dr. BuQuay Burow rosuvastatin replaced by pravastatin 1050maily   Recent consult visits: 03/20/2021 - Dr. SchWannetta SenderUrology - [facility] - has been doing well on trospium 50m10mily - plan to continue current medications and f/u in 6 months   Hospital visits: None in previous 6 months   Objective:  Lab Results  Component Value Date   CREATININE 0.68 07/24/2021   BUN 16 07/24/2021   GFR 87.63 07/24/2021   GFRNONAA 104.30 05/01/2010   NA 139 07/24/2021   K 3.9 07/24/2021   CALCIUM 10.0 07/24/2021   CO2 32 07/24/2021   GLUCOSE 95 07/24/2021    Lab Results  Component Value Date/Time   HGBA1C 6.4 07/24/2021 03:50 PM   HGBA1C 6.5 01/21/2021 04:21 PM   GFR 87.63 07/24/2021 03:50 PM   GFR 89.92 01/21/2021 04:21 PM    Last diabetic Eye exam: No results  found for: HMDIABEYEEXA  Last diabetic Foot exam: No results found for: HMDIABFOOTEX   Lab Results  Component Value Date   CHOL 249 (H) 07/24/2021   HDL 58.90 07/24/2021   LDLCALC 156 (H) 07/24/2021   LDLDIRECT 148.0 07/03/2017   TRIG 169.0 (H) 07/24/2021   CHOLHDL 4 07/24/2021    Hepatic Function Latest Ref Rng & Units 07/24/2021 03/12/2021 01/21/2021  Total Protein 6.0 - 8.3 g/dL 7.5 7.3 7.6  Albumin 3.5 - 5.2 g/dL 4.2 4.2  4.4  AST 0 - 37 U/L '22 17 22  ' ALT 0 - 35 U/L '10 7 8  ' Alk Phosphatase 39 - 117 U/L 82 73 80  Total Bilirubin 0.2 - 1.2 mg/dL 0.4 0.5 0.5  Bilirubin, Direct 0.0 - 0.3 mg/dL - 0.1 -    Lab Results  Component Value Date/Time   TSH 1.03 07/24/2021 03:50 PM   TSH 0.50 01/21/2021 04:21 PM   FREET4 1.31 12/29/2011 05:12 PM   FREET4 1.2 10/01/2009 02:08 PM    CBC Latest Ref Rng & Units 07/24/2021 08/16/2020 07/05/2018  WBC 4.0 - 10.5 K/uL 8.5 7.8 8.9  Hemoglobin 12.0 - 15.0 g/dL 12.2 12.4 12.9  Hematocrit 36.0 - 46.0 % 36.8 36.5 38.4  Platelets 150.0 - 400.0 K/uL 218.0 254.0 226.0    Lab Results  Component Value Date/Time   VD25OH 34.82 06/27/2016 03:25 PM    Clinical ASCVD: No  The 10-year ASCVD risk score (Arnett DK, et al., 2019) is: 15%   Values used to calculate the score:     Age: 5 years     Sex: Female     Is Non-Hispanic African American: No     Diabetic: No     Tobacco smoker: No     Systolic Blood Pressure: 338 mmHg     Is BP treated: Yes     HDL Cholesterol: 58.9 mg/dL     Total Cholesterol: 249 mg/dL    Depression screen [facility] 2/9 07/24/2021 01/31/2021 01/21/2021  Decreased Interest '1 1 1  ' Down, Depressed, Hopeless '1 2 2  ' PHQ - 2 Score '2 3 3  ' Altered sleeping 0 - 1  Tired, decreased energy 1 - 1  Change in appetite 2 - 3  Feeling bad or failure about yourself  1 - 3  Trouble concentrating 0 - 0  Moving slowly or fidgety/restless 1 - 2  Suicidal thoughts 0 - 0  PHQ-9 Score 7 - 13  Difficult doing work/chores Not difficult at all - Somewhat difficult    GAD 7 : Generalized Anxiety Score 07/24/2021 01/21/2021 01/11/2020  Nervous, Anxious, on Edge '1 1 1  ' Control/stop worrying '1 1 1  ' Worry too much - different things '1 1 1  ' Trouble relaxing '1 1 3  ' Restless 0 1 1  Easily annoyed or irritable 0 1 1  Afraid - awful might happen '1 2 1  ' Total GAD 7 Score '5 8 9  ' Anxiety Difficulty Not difficult at all Somewhat difficult -      Social History   Tobacco Use   Smoking Status Never  Smokeless Tobacco Never   BP Readings from Last 3 Encounters:  07/24/21 130/84  03/20/21 130/73  01/31/21 124/80   Pulse Readings from Last 3 Encounters:  07/24/21 74  03/20/21 85  01/31/21 79   Wt Readings from Last 3 Encounters:  07/24/21 207 lb (93.9 kg)  03/20/21 203 lb (92.1 kg)  01/31/21 203 lb 9.6 oz (92.4 kg)   BMI Readings from Last  3 Encounters:  07/24/21 37.86 kg/m  03/20/21 37.13 kg/m  01/31/21 38.47 kg/m    Assessment/Interventions: Review of patient past medical history, allergies, medications, health status, including review of consultants reports, laboratory and other test data, was performed as part of comprehensive evaluation and provision of chronic care management services.   SDOH:  (Social Determinants of Health) assessments and interventions performed: Yes   SDOH Screenings   Alcohol Screen: Low Risk    Last Alcohol Screening Score (AUDIT): 0  Depression (PHQ2-9): Medium Risk   PHQ-2 Score: 7  Financial Resource Strain: Low Risk    Difficulty of Paying Living Expenses: Not hard at all  Food Insecurity: No Food Insecurity   Worried About Charity fundraiser in the Last Year: Never true   Ran Out of Food in the Last Year: Never true  Housing: Low Risk    Last Housing Risk Score: 0  Physical Activity: Insufficiently Active   Days of Exercise per Week: 6 days   Minutes of Exercise per Session: 10 min  Social Connections: Moderately Integrated   Frequency of Communication with Friends and Family: More than three times a week   Frequency of Social Gatherings with Friends and Family: More than three times a week   Attends Religious Services: More than 4 times per year   Active Member of Clubs or Organizations: No   Attends Music therapist: More than 4 times per year   Marital Status: Widowed  Stress: No Stress Concern Present   Feeling of Stress : Not at all  Tobacco Use: Low Risk    Smoking Tobacco Use:  Never   Smokeless Tobacco Use: Never   Passive Exposure: Not on file  Transportation Needs: No Transportation Needs   Lack of Transportation (Medical): No   Lack of Transportation (Non-Medical): No    CCM Care Plan  Allergies  Allergen Reactions   Asa [Aspirin] Other (See Comments)    SOB and blocks ears and nose   Augmentin [Amoxicillin-Pot Clavulanate] Nausea And Vomiting   Crestor [Rosuvastatin]     Joint pain   Morphine And Related Nausea And Vomiting    Medications Reviewed Today     Reviewed by Tomasa Blase, [facility] (Pharmacist) on 09/02/21 at [place] List Status: <None>   Medication Order Taking? Sig Documenting Provider Last Dose Status Informant  bisoprolol-hydrochlorothiazide (ZIAC) 5-6.25 MG tablet [id number] Yes Take 1 tablet by mouth once daily Burns, Claudina Lick, MD Taking Active   escitalopram (LEXAPRO) 20 MG tablet [id number] Yes Take 1 tablet (20 mg total) by mouth daily. Norma Fredrickson, MD Taking Active   Eszopiclone 3 MG TABS [id number] Yes Take 1 tablet (3 mg total) by mouth at bedtime as needed. Take immediately before bedtime Plovsky, Berneta Sages, MD Taking Active   levothyroxine (SYNTHROID) 112 MCG tablet [id number] Yes Take 1 tablet by mouth once daily Burns, Claudina Lick, MD Taking Active   naproxen sodium (ALEVE) 220 MG tablet [id number] Yes Take 220 mg by mouth daily as needed. [provider] Taking Active   pravastatin (PRAVACHOL) 10 MG tablet [id number] Yes Take 1 tablet (10 mg total) by mouth daily. Binnie Rail, MD Taking Active   Trospium Chloride 60 MG CP24 [id number] Yes Take 1 capsule (60 mg total) by mouth daily. Jaquita Folds, MD Taking Active             Patient Active Problem List   Diagnosis Date Noted   Pseudogout of left wrist 01/21/2021  Overactive bladder 10/05/2020   Deviated septum 02/21/2019   Right shoulder pain 02/14/2019   Nasal fracture 02/13/2019   Prediabetes 07/05/2018   Popping sound of knee joint 07/05/2018    Chronic bilateral low back pain without sciatica 07/30/2017   Hyperlipidemia 07/04/2017   Piriformis syndrome of left side 04/29/2016   Left hip pain 03/27/2016   Lumbar radiculopathy 03/19/2016   Neck pain on right side 01/16/2016   AK (actinic keratosis) 10/19/2012   CARCINOMA, BASAL CELL, BACK 11/16/2009   ANXIETY DISORDER, GENERALIZED 07/30/2009   BACK PAIN WITH RADICULOPATHY 05/28/2009   OSTEOARTHRITIS 12/20/2007   Hypothyroidism 06/08/2007   Attention deficit hyperactivity disorder (ADHD) 06/08/2007   Carpal tunnel syndrome 06/08/2007   Essential hypertension 06/08/2007    Immunization History  Administered Date(s) Administered   Fluad Quad(high Dose 65+) 05/16/2019, 06/16/2020, 06/08/2021   Influenza, High Dose Seasonal PF 06/27/2016, 07/03/2017, 07/05/2018   PFIZER(Purple Top)SARS-COV-2 Vaccination 09/22/2019, 10/13/2019, 06/02/2020   Pfizer Covid-19 Vaccine Bivalent Booster 13yr & up 05/16/2021   Pneumococcal Conjugate-13 07/18/2016   Pneumococcal Polysaccharide-23 05/25/2015   Tdap 03/03/2012, 02/10/2019    Conditions to be addressed/monitored:  Hypertension, Hyperlipidemia, Depression, Anxiety, Overactive Bladder and Prediabetes  Care Plan : CCM Pharmacy Care plan  Updates made by STomasa Blase RPH since 09/02/2021 12:00 AM     Problem: Hypertension, Hyperlipidemia, Depression, Anxiety, Overactive Bladder and Prediabetes   Priority: High     Long-Range Goal: Disease management   Start Date: 01/21/2021  Expected End Date: 09/02/2022  This Visit's Progress: On track  Recent Progress: On track  Priority: High  Note:   Current Barriers:  Unable to independently monitor therapeutic efficacy  Pharmacist Clinical Goal(s):  Patient will achieve adherence to monitoring guidelines and medication adherence to achieve therapeutic efficacy through collaboration with PharmD and provider.   Interventions: 1:1 collaboration with BBinnie Rail MD regarding  development and update of comprehensive plan of care as evidenced by provider attestation and co-signature Inter-disciplinary care team collaboration (see longitudinal plan of care) Comprehensive medication review performed; medication list updated in electronic medical record  Hypertension (BP goal <130/80) -Controlled - pt does not monitor BP at home often but it has been at goal in office; she endorses compliance and denies side effects -Current treatment: Bisoprolol-HCTZ 5-6.25 mg daily -Current home readings: n/a BP Readings from Last 3 Encounters:  07/24/21 130/84  03/20/21 130/73  01/31/21 124/80  -Current dietary habits: no specific diet -Current exercise habits: none -Denies hypotensive/hypertensive symptoms -Educated on BP goals and benefits of medications for prevention of heart attack, stroke and kidney damage;Daily salt intake goal < 2300 mg; Exercise goal of 150 minutes per week; -Counseled to monitor BP at home as needed, document, and provide log at future appointments -Counseled on diet and exercise extensively Recommended to continue current medication  Hyperlipidemia: (LDL goal < 100) -Uncontrolled- started on pravastatin 177mdaily after last result  Lab Results  Component Value Date   LDLCALC 156 (H) 07/24/2021  -Current treatment: Pravastatin 102maily  -Educated on Cholesterol goals; Importance of limiting foods high in cholesterol; Exercise goal of 150 minutes per week; -Counseled on diet and exercise extensively -Patient previously had been prescribed rosuvastatin but was unable to tolerate due to pain in shoulder - has been taking pravastatin for about 30 days, denies any issues since starting   Depression/Anxiety/Insomnia (Goal: manage symptoms) -Controlled - pt follows with Dr PloCasimiro Needlehe is sleeping well -Current treatment: Escitalopram 20 mg daily Eszopiclone 3 mg daily PRN -  PHQ9: 1 (12/2019) -GAD7: 9 (06/2020) -Educated on Benefits of medication  for symptom control -Recommended to continue current medication  Hypothyroidism (Goal: maintain TSH in goal range) -Controlled Lab Results  Component Value Date   TSH 1.03 07/24/2021  -Current treatment  Levothyroxine 112 mcg daily -Recommended to continue current medication  Overactive bladder (Goal: reduce frequency) -Controlled -pt repots medication has improved urinary frequency -Current treatment  Trospium 60 mg daily -Medications previously tried: Myrbetriq -Recommended to continue current medication  Prediabetes (A1c goal <6.5%) -Controlled  Lab Results  Component Value Date   HGBA1C 6.4 07/24/2021  -Current meal patterns: 2 meals per day typically breakfast: cereal dinner: hamburgers, hot dogs, chicken patties snacks: ice cream -Current exercise: none -Educated on A1c and blood sugar goals;Complications of diabetes including kidney damage, retinal damage, and cardiovascular disease; Exercise goal of 150 minutes per week; Benefits of weight loss; Plate method -Counseled to check feet daily and get yearly eye exams -Counseled on diet and exercise extensively  Health Maintenance -Vaccine gaps: Shingrix -[facility]   Patient Goals/Self-Care Activities Patient will:  - take medications as prescribed focus on medication adherence by routine target a minimum of 150 minutes of moderate intensity exercise weekly engage in dietary modifications by limiting carbs  -Get Shingrix at local pharmacy       Medication Assistance: None required.  Patient affirms current coverage meets needs.  Care Gaps: Shingrix  Patient's preferred pharmacy is:  [facility] [place] - [address] [place] ([place]) [place] [address] Phone: [phone] Fax: [phone]  Uses pill box? Yes Pt endorses 100% compliance  We discussed: Current pharmacy is preferred with insurance plan and patient  is satisfied with pharmacy services Patient decided to: Continue current medication management strategy  Care Plan and Follow Up Patient Decision:  Patient agrees to Care Plan and Follow-up.  Plan: Telephone follow up appointment with care management team member scheduled for:  6 months  Tomasa Blase, PharmD Clinical Pharmacist, [place]

## 2021-09-04 ENCOUNTER — Telehealth: Payer: PPO

## 2021-09-12 ENCOUNTER — Telehealth: Payer: Self-pay | Admitting: Obstetrics and Gynecology

## 2021-09-12 DIAGNOSIS — N3281 Overactive bladder: Secondary | ICD-10-CM

## 2021-09-12 NOTE — Telephone Encounter (Signed)
Attempt made to contact Paige [name] is a 71 y.o. female re: message from Dr. Wannetta Sender below.  Pt was not available.  LM on the VM for the patient to call me back.

## 2021-09-16 NOTE — Telephone Encounter (Signed)
Pt said she will contact her insurance and check for the cheapest alternative

## 2021-09-17 DIAGNOSIS — E782 Mixed hyperlipidemia: Secondary | ICD-10-CM | POA: Diagnosis not present

## 2021-09-17 DIAGNOSIS — I10 Essential (primary) hypertension: Secondary | ICD-10-CM | POA: Diagnosis not present

## 2021-09-19 MED ORDER — VIBEGRON 75 MG PO TABS: 75.0000 mg | ORAL_TABLET | Freq: Every day | ORAL | 5 refills | Status: DC

## 2021-09-19 NOTE — Telephone Encounter (Signed)
Pt said she called her insurance and could not get a clear response on what is covered. I tried to call the insurance but the pt needs to call per the insurance rep .Pt is asking if you can send anything else

## 2021-09-19 NOTE — Telephone Encounter (Signed)
Gemtesa prescribed as an alternative. She has already been on Myrbetriq. If this is not covered, will need to do prior auth. If not approved, patient will need to return to office to discuss alternative treatment options.

## 2021-09-20 ENCOUNTER — Ambulatory Visit: Payer: PPO | Admitting: Obstetrics and Gynecology

## 2021-09-20 NOTE — Telephone Encounter (Signed)
LM on the VM notifying the patient of the change. Pt encouraged to call me back is she has any questions or concerns

## 2021-09-21 ENCOUNTER — Other Ambulatory Visit (HOSPITAL_COMMUNITY): Payer: Self-pay | Admitting: Psychiatry

## 2021-10-10 NOTE — Progress Notes (Signed)
Subjective:    Patient ID: Paige [name], female    DOB: 12-25-1950, 71 y.o.   MRN: [id number]  This visit occurred during the SARS-CoV-2 public health emergency.  Safety protocols were in place, including screening questions prior to the visit, additional usage of staff PPE, and extensive cleaning of exam room while observing appropriate contact time as indicated for disinfecting solutions.    HPI The patient is here for an acute visit.  Red hard spot under R breast x 2 weeks or more - it hurts.  It has not gotten larger as far as she can tell.  No fever.  She did have chills one day.  She denies discharge.    Medications and allergies reviewed with patient and updated if appropriate.  Patient Active Problem List   Diagnosis Date Noted   Cellulitis and abscess of trunk 10/11/2021   Pseudogout of left wrist 01/21/2021   Overactive bladder 10/05/2020   Deviated septum 02/21/2019   Right shoulder pain 02/14/2019   Nasal fracture 02/13/2019   Prediabetes 07/05/2018   Popping sound of knee joint 07/05/2018   Chronic bilateral low back pain without sciatica 07/30/2017   Hyperlipidemia 07/04/2017   Piriformis syndrome of left side 04/29/2016   Left hip pain 03/27/2016   Lumbar radiculopathy 03/19/2016   Neck pain on right side 01/16/2016   AK (actinic keratosis) 10/19/2012   CARCINOMA, BASAL CELL, BACK 11/16/2009   ANXIETY DISORDER, GENERALIZED 07/30/2009   BACK PAIN WITH RADICULOPATHY 05/28/2009   OSTEOARTHRITIS 12/20/2007   Hypothyroidism 06/08/2007   Attention deficit hyperactivity disorder (ADHD) 06/08/2007   Carpal tunnel syndrome 06/08/2007   Essential hypertension 06/08/2007    Current Outpatient Medications on File Prior to Visit  Medication Sig Dispense Refill   bisoprolol-hydrochlorothiazide (ZIAC) 5-6.25 MG tablet Take 1 tablet by mouth once daily 90 tablet 0   escitalopram (LEXAPRO) 20 MG tablet Take 1 tablet (20 mg total) by mouth daily. 30 tablet 7    Eszopiclone 3 MG TABS Take 1 tablet (3 mg total) by mouth at bedtime as needed. Take immediately before bedtime 30 tablet 5   levothyroxine (SYNTHROID) 112 MCG tablet Take 1 tablet by mouth once daily 90 tablet 0   pravastatin (PRAVACHOL) 10 MG tablet Take 1 tablet (10 mg total) by mouth daily. 30 tablet 5   Vibegron 75 MG TABS Take 75 mg by mouth daily. 30 tablet 5   No current facility-administered medications on file prior to visit.    Past Medical History:  Diagnosis Date   ADD (attention deficit disorder)    Pt has hard time focusing   Anxiety    Arthritis    Carpal tunnel syndrome    Colon polyp    Constipation    Depression    Gallstones    GERD (gastroesophageal reflux disease)    Hypertension    Status post dilation of esophageal narrowing    Thyroid disease     Past Surgical History:  Procedure Laterality Date   ABDOMINAL HYSTERECTOMY     CARPAL TUNNEL RELEASE Left    CHOLECYSTECTOMY     CLOSED REDUCTION HAND FRACTURE     right hand   fx leg     NASAL SINUS SURGERY     SHOULDER SURGERY     reattac tendon; right shoulder   TIBIA FRACTURE SURGERY     steel rod right leg   TUBAL LIGATION      Social History   Socioeconomic History   Marital  status: Widowed    Spouse name: Not on file   Number of children: 2   Years of education: Not on file   Highest education level: High school graduate  Occupational History   Occupation: retired    Fish farm manager: UNEMPLOYED   Occupation: Conservation officer, historic buildings: [employer]  Tobacco Use   Smoking status: Never   Smokeless tobacco: Never  Vaping Use   Vaping Use: Never used  Substance and Sexual Activity   Alcohol use: No   Drug use: No   Sexual activity: Not Currently  Other Topics Concern   Not on file  Social History Narrative   Works at [employer] - husband died of lung cancer      No regular exercise   Social Determinants of Health   Financial Resource Strain: Low  Risk    Difficulty of Paying Living Expenses: Not hard at all  Food Insecurity: No Food Insecurity   Worried About Charity fundraiser in the Last Year: Never true   Arboriculturist in the Last Year: Never true  Transportation Needs: No Transportation Needs   Lack of Transportation (Medical): No   Lack of Transportation (Non-Medical): No  Physical Activity: Insufficiently Active   Days of Exercise per Week: 6 days   Minutes of Exercise per Session: 10 min  Stress: No Stress Concern Present   Feeling of Stress : Not at all  Social Connections: Moderately Integrated   Frequency of Communication with Friends and Family: More than three times a week   Frequency of Social Gatherings with Friends and Family: More than three times a week   Attends Religious Services: More than 4 times per year   Active Member of Clubs or Organizations: No   Attends Music therapist: More than 4 times per year   Marital Status: Widowed    Family History  Problem Relation Age of Onset   Hyperlipidemia Mother    Colon cancer Mother        37   Lung cancer Father    Cancer Paternal Grandfather        mets   Leukemia Maternal Aunt     Review of Systems     Objective:   Vitals:   10/11/21 0802  BP: 128/74  Pulse: 61  Temp: 98.1 F (36.7 C)  SpO2: 97%   BP Readings from Last 3 Encounters:  10/11/21 128/74  07/24/21 130/84  03/20/21 130/73   Wt Readings from Last 3 Encounters:  10/11/21 205 lb (93 kg)  07/24/21 207 lb (93.9 kg)  03/20/21 203 lb (92.1 kg)   Body mass index is 37.49 kg/m.   Physical Exam Constitutional:      General: She is not in acute distress.    Appearance: Normal appearance. She is not ill-appearing.  HENT:     Head: Normocephalic.  Skin:    General: Skin is warm and dry.     Comments: Medial aspect of right lower breast - area of erythema 1 cm x 2 cm with larger area of induration 3 cm  x 5 cm.  No fluctuance. Remainder of breast tissue is normal -  no lumps  Neurological:     Mental Status: She is alert.           Assessment & Plan:    Reminded she is due for a colonoscopy    See Problem List for Assessment and Plan  of chronic medical problems.

## 2021-10-11 ENCOUNTER — Other Ambulatory Visit: Payer: Self-pay

## 2021-10-11 ENCOUNTER — Encounter: Payer: Self-pay | Admitting: Internal Medicine

## 2021-10-11 ENCOUNTER — Ambulatory Visit (INDEPENDENT_AMBULATORY_CARE_PROVIDER_SITE_OTHER): Payer: PPO | Admitting: Internal Medicine

## 2021-10-11 VITALS — BP 128/74 | HR 61 | Temp 98.1°F | Ht 62.0 in | Wt 205.0 lb

## 2021-10-11 DIAGNOSIS — L02219 Cutaneous abscess of trunk, unspecified: Secondary | ICD-10-CM | POA: Insufficient documentation

## 2021-10-11 DIAGNOSIS — L03319 Cellulitis of trunk, unspecified: Secondary | ICD-10-CM | POA: Diagnosis not present

## 2021-10-11 HISTORY — DX: Cutaneous abscess of trunk, unspecified: L02.219

## 2021-10-11 MED ORDER — DOXYCYCLINE HYCLATE 100 MG PO TABS: 100.0000 mg | ORAL_TABLET | Freq: Two times a day (BID) | ORAL | 0 refills | Status: AC

## 2021-10-11 NOTE — Patient Instructions (Addendum)
You have a skin infection and small abscess.  Apply warm compresses as much as possible.   Medications changes include :   doxycycline twice daily for 10 days   Your prescription(s) have been sent to your pharmacy.    Monitor this closely and it if is not improving or getting worse please return for follow up.     Cellulitis, Adult Cellulitis is a skin infection. The infected area is usually warm, red, swollen, and tender. This condition occurs most often in the arms and lower legs. The infection can travel to the muscles, blood, and underlying tissue and become serious. It is very important to get treated for this condition. What are the causes? Cellulitis is caused by bacteria. The bacteria enter through a break in the skin, such as a cut, burn, insect bite, open sore, or crack. What increases the risk? This condition is more likely to occur in people who: Have a weak body defense system (immune system). Have open wounds on the skin, such as cuts, Shray [place], bites, and scrapes. Bacteria can enter the body through these open wounds. Are older than 72 years of age. Have diabetes. Have a type of long-lasting (chronic) liver disease (cirrhosis) or kidney disease. Are obese. Have a skin condition such as: Itchy rash (eczema). Slow movement of blood in the veins (venous stasis). Fluid buildup below the skin (edema). Have had radiation therapy. Use IV drugs. What are the signs or symptoms? Symptoms of this condition include: Redness, streaking, or spotting on the skin. Swollen area of the skin. Tenderness or pain when an area of the skin is touched. Warm skin. A fever. Chills. Blisters. How is this diagnosed? This condition is diagnosed based on a medical history and physical exam. You may also have tests, including: Blood tests. Imaging tests. How is this treated? Treatment for this condition may include: Medicines, such as antibiotic medicines or medicines to treat  allergies (antihistamines). Supportive care, such as rest and application of cold or warm cloths (compresses) to the skin. Hospital care, if the condition is severe. The infection usually starts to get better within 1-2 days of treatment. Follow these instructions at home: Medicines Take over-the-counter and prescription medicines only as told by your health care provider. If you were prescribed an antibiotic medicine, take it as told by your health care provider. Do not stop taking the antibiotic even if you start to feel better. General instructions Drink enough fluid to keep your urine pale yellow. Do not touch or rub the infected area. Raise (elevate) the infected area above the level of your heart while you are sitting or lying down. Apply warm or cold compresses to the affected area as told by your health care provider. Keep all follow-up visits as told by your health care provider. This is important. These visits let your health care provider make sure a more serious infection is not developing. Contact a health care provider if: You have a fever. Your symptoms do not begin to improve within 1-2 days of starting treatment. Your bone or joint underneath the infected area becomes painful after the skin has healed. Your infection returns in the same area or another area. You notice a swollen bump in the infected area. You develop new symptoms. You have a general ill feeling (malaise) with muscle aches and pains. Get help right away if: Your symptoms get worse. You feel very sleepy. You develop vomiting or diarrhea that persists. You notice red streaks coming from the  infected area. Your red area gets larger or turns dark in color. These symptoms may represent a serious problem that is an emergency. Do not wait to see if the symptoms will go away. Get medical help right away. Call your local emergency services (911 in the [place] Do not drive yourself to the  hospital. Summary Cellulitis is a skin infection. This condition occurs most often in the arms and lower legs. Treatment for this condition may include medicines, such as antibiotic medicines or antihistamines. Take over-the-counter and prescription medicines only as told by your health care provider. If you were prescribed an antibiotic medicine, do not stop taking the antibiotic even if you start to feel better. Contact a health care provider if your symptoms do not begin to improve within 1-2 days of starting treatment or your symptoms get worse. Keep all follow-up visits as told by your health care provider. This is important. These visits let your health care provider make sure that a more serious infection is not developing. This information is not intended to replace advice given to you by your health care provider. Make sure you discuss any questions you have with your health care provider. Document Revised: 08/15/2019 Document Reviewed: 12/24/2017 Elsevier Patient Education  [place].

## 2021-10-11 NOTE — Assessment & Plan Note (Signed)
Acute Cellulitis and abscess of medial lower right breast Stressed warm compresses Start doxycycline 100 mg bid x 10 days Advised to monitor and return it not improving

## 2021-10-21 ENCOUNTER — Other Ambulatory Visit: Payer: Self-pay | Admitting: Internal Medicine

## 2021-10-23 ENCOUNTER — Encounter: Payer: Self-pay | Admitting: Internal Medicine

## 2021-11-18 ENCOUNTER — Encounter: Payer: Self-pay | Admitting: Internal Medicine

## 2021-11-20 DIAGNOSIS — D485 Neoplasm of uncertain behavior of skin: Secondary | ICD-10-CM | POA: Diagnosis not present

## 2021-11-20 DIAGNOSIS — Z85828 Personal history of other malignant neoplasm of skin: Secondary | ICD-10-CM | POA: Diagnosis not present

## 2021-11-20 DIAGNOSIS — L814 Other melanin hyperpigmentation: Secondary | ICD-10-CM | POA: Diagnosis not present

## 2021-11-20 DIAGNOSIS — L821 Other seborrheic keratosis: Secondary | ICD-10-CM | POA: Diagnosis not present

## 2021-11-20 DIAGNOSIS — D1801 Hemangioma of skin and subcutaneous tissue: Secondary | ICD-10-CM | POA: Diagnosis not present

## 2021-11-20 DIAGNOSIS — L853 Xerosis cutis: Secondary | ICD-10-CM | POA: Diagnosis not present

## 2021-11-20 DIAGNOSIS — L720 Epidermal cyst: Secondary | ICD-10-CM | POA: Diagnosis not present

## 2021-11-20 DIAGNOSIS — C44519 Basal cell carcinoma of skin of other part of trunk: Secondary | ICD-10-CM | POA: Diagnosis not present

## 2021-11-25 ENCOUNTER — Ambulatory Visit: Payer: PPO | Admitting: Obstetrics and Gynecology

## 2021-11-25 ENCOUNTER — Encounter: Payer: Self-pay | Admitting: Obstetrics and Gynecology

## 2021-11-25 VITALS — BP 108/53 | HR 59

## 2021-11-25 DIAGNOSIS — N3281 Overactive bladder: Secondary | ICD-10-CM | POA: Diagnosis not present

## 2021-11-25 NOTE — Progress Notes (Signed)
[facility] ?Return Visit ? ?SUBJECTIVE  ?History of Present Illness: ?Paige [name] is a 71 y.o. female seen in follow-up for overactive bladder. After last visit, trospium was changed to [place] '75mg'$  [name] to insurance coverage. ? ?She was not able to get the medication [name] to cost.  ? ?Sometimes she does not feel that she has to go but will stand up and then all of a sudden the bladder empties. Leaks on the way to the bathroom daily. Wakes up once at night.  ? ?Past Medical History: ?Patient  has a past medical history of ADD (attention deficit disorder), Anxiety, Arthritis, Carpal tunnel syndrome, Colon polyp, Constipation, Depression, Gallstones, GERD (gastroesophageal reflux disease), Hypertension, Status post dilation of esophageal narrowing, and Thyroid disease.  ? ?Past Surgical History: ?She  has a past surgical history that includes Cholecystectomy; Abdominal hysterectomy; Tubal ligation; fx leg; Nasal sinus surgery; Closed reduction hand fracture; Carpal tunnel release (Left); Tibia fracture surgery; and Shoulder surgery.  ? ?Medications: ?She has a current medication list which includes the following prescription(s): bisoprolol-hydrochlorothiazide, escitalopram, eszopiclone, levothyroxine, pravastatin, and vibegron.  ? ?Allergies: ?Patient is allergic to asa [aspirin], augmentin [amoxicillin-pot clavulanate], crestor [rosuvastatin], and morphine and related.  ? ?Social History: ?Patient  reports that she has never smoked. She has never used smokeless tobacco. She reports that she does not drink alcohol and does not use drugs.  ?  ?  ?OBJECTIVE  ?  ? ?Physical Exam: ?Vitals:  ? 11/25/21 0902  ?BP: (!) 108/53  ?Pulse: (!) 59  ? ? ? ?Gen: No apparent distress, A&O x 3. ? ?Detailed Urogynecologic Evaluation:  ?Deferred.   ? ?ASSESSMENT AND PLAN  ?  ?Paige [name] is a 71 y.o. with:  ?1. Overactive bladder   ? ?- We reviewed alternatives to medication for OAB.  ?- For refractory OAB we  reviewed the procedure for intravesical Botox injection with cystoscopy in the office and reviewed the risks, benefits and alternatives of treatment including but not limited to infection, need for self-catheterization and need for repeat therapy.  We discussed that there is a 5-15% chance of needing to catheterize with Botox and that this usually resolves in a few months; however can persist for longer periods of time.  Typically Botox injections would need to be repeated every 3-12 months since this is not a permanent therapy.  ?- We discussed the role of sacral neuromodulation and how it works. It requires a test phase, and documentation of bladder function via diary. After a successful test period, a permanent wire and generator are placed in the OR.  ?- We also discussed the role of percutaneous tibial nerve stimulation and how it works.  She understands it requires 12 weekly visits for temporary neuromodulation of the sacral nerve roots via the tibial nerve and that she may then require continued tapered treatment.  She will return for the procedure. All questions were answered. ?- We also reviewed the option of pelvic PT ?- She would like to start PTNS. Will have her fill out a baseline bladder diary to further assess her leakage prior to treatment.  ? ?Follow up for PTNS ? ?Paige Folds, MD ? ?Time spent: I spent 20 minutes dedicated to the care of this patient on the date of this encounter to include pre-visit review of records, face-to-face time with the patient and post visit documentation. ? ?

## 2021-11-28 ENCOUNTER — Telehealth: Payer: Self-pay

## 2021-11-28 NOTE — Progress Notes (Signed)
? ? ?  Chronic Care Management ?Pharmacy Assistant  ? ?Name: Paige [name]  MRN: [id number] DOB: [birth year] ? ? ?Reason for Encounter: Disease State-General ?  ? ?Recent office visits:  ?10/11/21 Binnie Rail, MD-PCP (Cellulitis and abscess of trunk) No orders, med changes: Doxycyccline Hyclate 100 mg ? ?Recent consult visits:  ?11/25/21 Jaquita Folds, MD (Overactive bladder) No orders or med changes ? ?Hospital visits:  ?None since last coordination call ? ?Medications: ?Outpatient Encounter Medications as of 11/28/2021  ?Medication Sig  ? bisoprolol-hydrochlorothiazide (ZIAC) 5-6.25 MG tablet Take 1 tablet by mouth once daily  ? escitalopram (LEXAPRO) 20 MG tablet Take 1 tablet (20 mg total) by mouth daily.  ? Eszopiclone 3 MG TABS Take 1 tablet (3 mg total) by mouth at bedtime as needed. Take immediately before bedtime  ? levothyroxine (SYNTHROID) 112 MCG tablet Take 1 tablet by mouth once daily  ? pravastatin (PRAVACHOL) 10 MG tablet Take 1 tablet (10 mg total) by mouth daily.  ? Vibegron 75 MG TABS Take 75 mg by mouth daily. (Patient not taking: Reported on 11/25/2021)  ? ?No facility-administered encounter medications on file as of 11/28/2021.  ? ?Have you had any problems recently with your health?Patient states that she is doing well and does not have any health issues to discuss. She states that she is good on all of her medications. She sates that she does have a colonoscopy that is coming up soon. ? ?Have you had any problems with your pharmacy?Patient states that she does not have any problems with getting medications or the cost of medications from the pharmacy ? ?What issues or side effects are you having with your medications?Patient states that she is not having any side effects from medications ? ?What would you like me to pass along to [facility] for them to help you with? Patient states that she is doing well. Patient has f/u call on 03/03/22 with clinical pharmacist ? ?What can we do  to take care of you better? Patient states that she does not need anything at this time ? ?Care Gaps: ?Colonoscopy-09/30/16 ?Diabetic Foot Exam-NA ?Mammogram-02/20/21 ?Ophthalmology-NA ?Dexa Scan - 09/04/20 ?Annual Well Visit - 07/24/21 ?Micro albumin-NA ?Hemoglobin A1c- 07/24/21 ? ?Star Rating Drugs: ?Pravastatin 10 mg-last fill 11/18/21 30 ds ? ?Ethelene Hal ?Clinical Pharmacist Assistant ?[phone]  ?

## 2021-12-03 ENCOUNTER — Telehealth (HOSPITAL_BASED_OUTPATIENT_CLINIC_OR_DEPARTMENT_OTHER): Payer: PPO | Admitting: Psychiatry

## 2021-12-03 DIAGNOSIS — F325 Major depressive disorder, single episode, in full remission: Secondary | ICD-10-CM

## 2021-12-03 MED ORDER — ESZOPICLONE 3 MG PO TABS: 3.0000 mg | ORAL_TABLET | Freq: Every evening | ORAL | 5 refills | Status: DC | PRN

## 2021-12-03 MED ORDER — ESCITALOPRAM OXALATE 20 MG PO TABS: 20.0000 mg | ORAL_TABLET | Freq: Every day | ORAL | 7 refills | Status: DC

## 2021-12-03 NOTE — Progress Notes (Addendum)
Patient ID: Paige [name], female   DOB: 1951-06-05, 71 y.o.   MRN: [id number] Patient ID: ANEL CREIGHTON, female   DOB: 1950/12/26, 71 y.o.   MRN: [id number] [facility] MD Progress Note  12/03/2021 1:23 PM Kyra Manges Wisener   Pa time.st Medical History:   MRN:  [id number] Diagnosis major depressis  Today the patient is doing very well.  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  The patient denies being depressed.  She takes her medicines as prescribed.  Her health is good.  She is seeing her primary care doctor soon.  She denies persistent daily depression.  Her anxiety is well controlled.  She is sleeping and eating quite well.  Financially she is stable.  She is still paying on a house that she lives in.  She drinks no alcohol and uses no drugs has no psychosis.  She is functioning well.  She is independent.  She like things to stay as they are.  At this time the patient i Past Medical History:  Diagnosis Date   ADD (attention deficit disorder)    Pt has hard time focusing   Anxiety    Arthritis    Carpal tunnel syndrome    Colon polyp    Constipation    Depression    Gallstones    GERD (gastroesophageal reflux disease)    Hypertension    Status post dilation of esophageal narrowing    Thyroid disease     Past Surgical History:  Procedure Laterality Date   ABDOMINAL HYSTERECTOMY     CARPAL TUNNEL RELEASE Left    CHOLECYSTECTOMY     CLOSED REDUCTION HAND FRACTURE     right hand   fx leg     NASAL SINUS SURGERY     SHOULDER SURGERY     reattac tendon; right shoulder   TIBIA FRACTURE SURGERY     steel rod right leg   TUBAL LIGATION     Family History:  Family History  Problem Relation Age of Onset   Hyperlipidemia Mother    Colon cancer Mother        56   Lung cancer Father    Cancer Paternal Grandfather        mets   Leukemia Maternal Aunt     Family Psychiatric  History:  Social History:  Social History   Substance and Sexual Activity  Alcohol Use No     Social History   Substance and Sexual Activity  Drug Use No    Social History   Socioeconomic History   Marital status: Widowed    Spouse name: Not on file   Number of children: 2   Years of education: Not on file   Highest education level: High school graduate  Occupational History   Occupation: retired    Fish farm manager: UNEMPLOYED   Occupation: Conservation officer, historic buildings: [employer]  Tobacco Use   Smoking status: Never   Smokeless tobacco: Never  Vaping Use   Vaping Use: Never used  Substance and Sexual Activity   Alcohol use: No   Drug use: No   Sexual activity: Not Currently  Other Topics Concern   Not on file  Social History Narrative   Works at [employer] - husband died of lung cancer      No regular exercise  Social Determinants of Health   Financial Resource Strain: Low Risk    Difficulty of Paying Living Expenses: Not hard at all  Food Insecurity: No Food Insecurity   Worried About Charity fundraiser in the Last Year: Never true   [place] in the Last Year: Never true  Transportation Needs: No Transportation Needs   Lack of Transportation (Medical): No   Lack of Transportation (Non-Medical): No  Physical Activity: Insufficiently Active   Days of Exercise per Week: 6 days   Minutes of Exercise per Session: 10 min  Stress: No Stress Concern Present   Feeling of Stress : Not at all  Social Connections: Moderately Integrated   Frequency of Communication with Friends and Family: More than three times a week   Frequency of Social Gatherings with Friends and Family: More than three times a week   Attends Religious Services: More than 4 times per year   Active Member of Clubs or Organizations: No   Attends Music therapist: More than 4 times per year   Marital Status: Widowed    Additional Social History:                         Sleep: Fair  Appetite:  Good  Current Medications: Current Outpatient Medications  Medication Sig Dispense Refill   bisoprolol-hydrochlorothiazide (ZIAC) 5-6.25 MG tablet Take 1 tablet by mouth once daily 90 tablet 0   escitalopram (LEXAPRO) 20 MG tablet Take 1 tablet (20 mg total) by mouth daily. 30 tablet 7   Eszopiclone 3 MG TABS Take 1 tablet (3 mg total) by mouth at bedtime as needed. Take immediately before bedtime 30 tablet 5   levothyroxine (SYNTHROID) 112 MCG tablet Take 1 tablet by mouth once daily 90 tablet 0   pravastatin (PRAVACHOL) 10 MG tablet Take 1 tablet (10 mg total) by mouth daily. 30 tablet 5   Vibegron 75 MG TABS Take 75 mg by mouth daily. (Patient not taking: Reported on 11/25/2021) 30 tablet 5   No current facility-administered medications for this visit.    Lab Results: No results found for this or any previous visit (from the past 48 hour(s)).  Blood Alcohol level:  No results found for: [facility]  Physical Findings: AIMS:  , ,  ,  ,    CIWA:    COWS:     Musculoskeletal: Strength & Muscle Tone: within normal limits Gait & Station: normal Patient leans: N/A  Psychiatric Specialty Exam: ROS  There were no vitals taken for this visit.There is no height or weight on file to calculate BMI.  General Appearance: Fairly Groomed  Engineer, water::  Good  Speech:  Clear and Coherent  Volume:  Normal  Mood:  Euthymic  Affect:  Blunt  Thought Process:  Coherent  Orientation:  Full (Time, Place, and Person)  Thought Content:  WDL  Suicidal Thoughts:  No  Homicidal Thoughts:  No  Memory:  NA  Judgement:  NA  Insight:  Good  Psychomotor Activity:  Decreased  Concentration:  Fair  Recall:  [employer] of Knowledge:Good  Language: Fair  Akathisia:  No  Handed:  Right  AIMS (if indicated):     Assets:  Desire for Improvement  ADL's:  Intact  Cognition: WNL  Sleep:      Treatment Plan  Summary: 12/03/2021, 1:23 PM  Today the patient is doing well.  She is being treated for 2 problems.  The first  1 is that of major depression.  She takes Lexapro 20 mg every morning and does well.  Her second problem is insomnia.  She takes Lunesta 3 mg and is doing great.  She is sleeping and eating well.  She has had no falls.  Medically she is stable.  She will return to see me in 5 months. Phone called patient at home I called from center  time spent 25 minutes

## 2021-12-27 ENCOUNTER — Ambulatory Visit (AMBULATORY_SURGERY_CENTER): Payer: PPO

## 2021-12-27 VITALS — Ht 62.0 in | Wt 207.0 lb

## 2021-12-27 DIAGNOSIS — Z86010 Personal history of colonic polyps: Secondary | ICD-10-CM

## 2021-12-27 DIAGNOSIS — Z800 Family history of malignant neoplasm of digestive organs: Secondary | ICD-10-CM

## 2021-12-27 MED ORDER — PEG 3350-KCL-NA BICARB-NACL 420 G PO SOLR: 4000.0000 mL | Freq: Once | ORAL | 0 refills | Status: AC

## 2021-12-27 NOTE — Progress Notes (Signed)
No egg or soy allergy known to patient  ?No issues known to pt with past sedation with any surgeries or procedures ?Patient denies ever being told they had issues or difficulty with intubation  ?No FH of Malignant Hyperthermia ?Pt is not on diet pills ?Pt is not on home 02  ?Pt is not on blood thinners  ?Pt denies issues with constipation  ?No A fib or A flutter ?NO PA's for preps discussed with pt in PV today  ?Discussed with pt there will be an out-of-pocket cost for prep and that varies from $0 to 70 + dollars - pt verbalized understanding  ?Pt instructed to use Singlecare.com or GoodRx for a price reduction on prep  ?PV completed over the phone. Pt verified name, DOB, address and insurance during PV today.  ?Pt mailed instruction packet with copy of consent form to read and not return, and instructions.  ?Pt encouraged to call with questions or issues.  ?If pt has My chart, procedure instructions sent via My Chart  ?Insurance confirmed with pt at [facility] today  ? ?

## 2021-12-30 ENCOUNTER — Telehealth: Payer: Self-pay | Admitting: Obstetrics and Gynecology

## 2022-01-03 ENCOUNTER — Ambulatory Visit: Payer: PPO | Admitting: Obstetrics and Gynecology

## 2022-01-09 ENCOUNTER — Ambulatory Visit: Payer: Self-pay | Admitting: Obstetrics and Gynecology

## 2022-01-09 ENCOUNTER — Encounter: Payer: Self-pay | Admitting: Internal Medicine

## 2022-01-09 NOTE — Telephone Encounter (Signed)
Reviewed

## 2022-01-10 ENCOUNTER — Ambulatory Visit (AMBULATORY_SURGERY_CENTER): Payer: PPO | Admitting: Internal Medicine

## 2022-01-10 ENCOUNTER — Encounter: Payer: Self-pay | Admitting: Internal Medicine

## 2022-01-10 VITALS — BP 115/82 | HR 65 | Temp 98.6°F | Resp 12 | Ht 62.0 in | Wt 207.0 lb

## 2022-01-10 DIAGNOSIS — D125 Benign neoplasm of sigmoid colon: Secondary | ICD-10-CM | POA: Diagnosis not present

## 2022-01-10 DIAGNOSIS — Z86010 Personal history of colonic polyps: Secondary | ICD-10-CM

## 2022-01-10 DIAGNOSIS — Z800 Family history of malignant neoplasm of digestive organs: Secondary | ICD-10-CM

## 2022-01-10 MED ORDER — SODIUM CHLORIDE 0.9 % IV SOLN: 500.0000 mL | Freq: Once | INTRAVENOUS | Status: DC

## 2022-01-10 NOTE — Op Note (Signed)
[facility] Patient Name: Paige [name] Procedure Date: 01/10/2022 11:03 AM MRN: [id number] Endoscopist: Docia Chuck. Henrene Pastor , MD Age: 71 Referring MD:  Date of Birth: [birth year] Gender: Female Account #: [id number] Procedure:                Colonoscopy with cold snare polypectomy x 1 Indications:              High risk colon cancer surveillance: Personal                            history of adenoma (10 mm or greater in size) 2013,                            2018. Mother with a history of colon cancer in her                            71s Medicines:                Monitored Anesthesia Care Procedure:                Pre-Anesthesia Assessment:                           - Prior to the procedure, a History and Physical                            was performed, and patient medications and                            allergies were reviewed. The patient's tolerance of                            previous anesthesia was also reviewed. The risks                            and benefits of the procedure and the sedation                            options and risks were discussed with the patient.                            All questions were answered, and informed consent                            was obtained. Prior Anticoagulants: The patient has                            taken no previous anticoagulant or antiplatelet                            agents. ASA Grade Assessment: II - A patient with                            mild systemic disease. After reviewing the risks  and benefits, the patient was deemed in                            satisfactory condition to undergo the procedure.                           After obtaining informed consent, the colonoscope                            was passed under direct vision. Throughout the                            procedure, the patient's blood pressure, pulse, and                            oxygen saturations were  monitored continuously. The                            Olympus #[id number] was introduced through the anus                            and advanced to the the cecum, identified by                            appendiceal orifice and ileocecal valve. The                            ileocecal valve, appendiceal orifice, and rectum                            were photographed. The quality of the bowel                            preparation was excellent. The colonoscopy was                            performed without difficulty. The patient tolerated                            the procedure well. The bowel preparation used was                            SUPREP via split dose instruction. Scope In: 11:18:49 AM Scope Out: 11:32:17 AM Scope Withdrawal Time: 0 hours 10 minutes 36 seconds  Total Procedure Duration: 0 hours 13 minutes 28 seconds  Findings:                 A 2 mm polyp was found in the sigmoid colon. The                            polyp was sessile. The polyp was removed with a                            cold snare. Resection and retrieval  were complete.                           The exam was otherwise without abnormality on                            direct and retroflexion views. Complications:            No immediate complications. Estimated blood loss:                            None. Estimated Blood Loss:     Estimated blood loss: none. Impression:               - One 2 mm polyp in the sigmoid colon, removed with                            a cold snare. Resected and retrieved.                           - The examination was otherwise normal on direct                            and retroflexion views. Recommendation:           - Repeat colonoscopy in 5 years for surveillance to                            be considered based on overall health and                            willingness.                           - Patient has a contact number available for                             emergencies. The signs and symptoms of potential                            delayed complications were discussed with the                            patient. Return to normal activities tomorrow.                            Written discharge instructions were provided to the                            patient.                           - Resume previous diet.                           - Continue present medications.                           -  Await pathology results. Docia Chuck. Henrene Pastor, MD 01/10/2022 11:39:36 AM This report has been signed electronically.

## 2022-01-10 NOTE — Progress Notes (Signed)
PT taken to PACU. Monitors in place. VSS. Report given to RN. 

## 2022-01-10 NOTE — Progress Notes (Signed)
Called to room to assist during endoscopic procedure.  Patient ID and intended procedure confirmed with present staff. Received instructions for my participation in the procedure from the performing physician.  

## 2022-01-10 NOTE — Progress Notes (Signed)
Pt's states no medical or surgical changes since previsit or office visit. 

## 2022-01-10 NOTE — Progress Notes (Signed)
HISTORY OF PRESENT ILLNESS:  Paige [name] is a 71 y.o. female who presents today for surveillance colonoscopy.  She has a history of advanced adenomatous colon polyps as well as a family history of colon cancer in her mother at an advanced age.  Last examination 2018 was negative for neoplasia.  No active complaints  REVIEW OF SYSTEMS:  All non-GI ROS negative. Past Medical History:  Diagnosis Date   ADD (attention deficit disorder)    Pt has hard time focusing   Anxiety    on meds   Arthritis    Carpal tunnel syndrome    Colon polyp    Constipation    Depression    Gallstones    GERD (gastroesophageal reflux disease)    on meds   Hyperlipidemia    on meds   Hypertension    on meds   Status post dilation of esophageal narrowing    Thyroid disease    on meds    Past Surgical History:  Procedure Laterality Date   ABDOMINAL HYSTERECTOMY     CARPAL TUNNEL RELEASE Left    CHOLECYSTECTOMY     CLOSED REDUCTION HAND FRACTURE     right hand   COLONOSCOPY  2018   JP-MAC-suprep(exc)-int hems-5 yr recall-   COLONOSCOPY  2013   TA   fx leg     NASAL SINUS SURGERY     POLYPECTOMY  2013   TA   SHOULDER SURGERY     reattac tendon; right shoulder   TIBIA FRACTURE SURGERY     steel rod right leg   TUBAL LIGATION      Social History TESSIA KASSIN  reports that she has never smoked. She has never used smokeless tobacco. She reports that she does not drink alcohol and does not use drugs.  family history includes Cancer in her paternal grandfather; Colon cancer (age of onset: 84) in her mother; Colon polyps (age of onset: 44) in her mother; Hyperlipidemia in her mother; Leukemia in her maternal aunt; Lung cancer in her father.  Allergies  Allergen Reactions   Asa [Aspirin] Other (See Comments)    SOB and blocks ears and nose   Augmentin [Amoxicillin-Pot Clavulanate] Nausea And Vomiting   Crestor [Rosuvastatin]     Joint pain   Morphine And Related Nausea And  Vomiting       PHYSICAL EXAMINATION:  Vital signs: BP 140/68   Pulse (!) 56   Temp 98.6 F (37 C)   Ht _0  (1.575 m)   Wt 207 lb (93.9 kg)   SpO2 96%   BMI 37.86 kg/m  General: Well-developed, well-nourished, no acute distress HEENT: Sclerae are anicteric, conjunctiva pink. Oral mucosa intact Lungs: Clear Heart: Regular Abdomen: soft, nontender, nondistended, no obvious ascites, no peritoneal signs, normal bowel sounds. No organomegaly. Extremities: No edema Psychiatric: alert and oriented x3. Cooperative     ASSESSMENT:  1.  History of advanced adenoma 2.  Family history of colon cancer   PLAN:  1.  Surveillance colonoscopy

## 2022-01-10 NOTE — Patient Instructions (Signed)
YOU HAD AN ENDOSCOPIC PROCEDURE TODAY AT THE [facility]:   Refer to the procedure report that was given to you for any specific questions about what was found during the examination.  If the procedure report does not answer your questions, please call your gastroenterologist to clarify.  If you requested that your care partner not be given the details of your procedure findings, then the procedure report has been included in a sealed envelope for you to review at your convenience later.  YOU SHOULD EXPECT: Some feelings of bloating in the abdomen. Passage of more gas than usual.  Walking can help get rid of the air that was put into your GI tract during the procedure and reduce the bloating. If you had a lower endoscopy (such as a colonoscopy or flexible sigmoidoscopy) you may notice spotting of blood in your stool or on the toilet paper. If you underwent a bowel prep for your procedure, you may not have a normal bowel movement for a few days.  Please Note:  You might notice some irritation and congestion in your nose or some drainage.  This is from the oxygen used during your procedure.  There is no need for concern and it should clear up in a day or so.  SYMPTOMS TO REPORT IMMEDIATELY:   Following lower endoscopy (colonoscopy or flexible sigmoidoscopy):  Excessive amounts of blood in the stool  Significant tenderness or worsening of abdominal pains  Swelling of the abdomen that is new, acute  Fever of 100F or higher   Following upper endoscopy (EGD)  Vomiting of blood or coffee ground material  New chest pain or pain under the shoulder blades  Painful or persistently difficult swallowing  New shortness of breath  Fever of 100F or higher  Black, tarry-looking stools  For urgent or emergent issues, a gastroenterologist can be reached at any hour by calling [phone]. Do not use MyChart messaging for urgent concerns.    DIET:  We do recommend a small meal at first, but  then you may proceed to your regular diet.  Drink plenty of fluids but you should avoid alcoholic beverages for 24 hours.  ACTIVITY:  You should plan to take it easy for the rest of today and you should NOT DRIVE or use heavy machinery until tomorrow (because of the sedation medicines used during the test).    FOLLOW UP: Our staff will call the number listed on your records 48-72 hours following your procedure to check on you and address any questions or concerns that you may have regarding the information given to you following your procedure. If we do not reach you, we will leave a message.  We will attempt to reach you two times.  During this call, we will ask if you have developed any symptoms of COVID 19. If you develop any symptoms (ie: fever, flu-like symptoms, shortness of breath, cough etc.) before then, please call [phone].  If you test positive for Covid 19 in the 2 weeks post procedure, please call and report this information to us.    If any biopsies were taken you will be contacted by phone or by letter within the next 1-3 weeks.  Please call us at [phone] if you have not heard about the biopsies in 3 weeks.    SIGNATURES/CONFIDENTIALITY: You and/or your care partner have signed paperwork which will be entered into your electronic medical record.  These signatures attest to the fact that that the information above on   your After Visit Summary has been reviewed and is understood.  Full responsibility of the confidentiality of this discharge information lies with you and/or your care-partner. 

## 2022-01-14 ENCOUNTER — Telehealth: Payer: Self-pay | Admitting: *Deleted

## 2022-01-14 NOTE — Telephone Encounter (Signed)
  Follow up Call-     01/10/2022   10:25 AM  Call back number  Post procedure Call Back phone  # [phone]  Permission to leave phone message Yes     Patient questions:  Do you have a fever, pain , or abdominal swelling? No. Pain Score  0 *  Have you tolerated food without any problems? Yes.    Have you been able to return to your normal activities? Yes.    Do you have any questions about your discharge instructions: Diet   No. Medications  No. Follow up visit  No.  Do you have questions or concerns about your Care? No.  Actions: * If pain score is 4 or above: No action needed, pain <4.

## 2022-01-15 ENCOUNTER — Encounter: Payer: Self-pay | Admitting: Internal Medicine

## 2022-01-17 ENCOUNTER — Ambulatory Visit: Payer: Self-pay | Admitting: Obstetrics and Gynecology

## 2022-01-20 ENCOUNTER — Other Ambulatory Visit: Payer: Self-pay | Admitting: Internal Medicine

## 2022-01-22 NOTE — Progress Notes (Signed)
Subjective:    Patient ID: Paige [name], female    DOB: [birth year], 71 y.o.   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    She is exercising minimally - walks up and down the driveway a few times.        Medications and allergies reviewed with patient and updated if appropriate.  Current Outpatient Medications on File Prior to Visit  Medication Sig Dispense Refill   bisoprolol-hydrochlorothiazide (ZIAC) 5-6.25 MG tablet Take 1 tablet by mouth once daily 90 tablet 0   escitalopram (LEXAPRO) 20 MG tablet Take 1 tablet (20 mg total) by mouth daily. 30 tablet 7   Eszopiclone 3 MG TABS Take 1 tablet (3 mg total) by mouth at bedtime as needed. Take immediately before bedtime 30 tablet 5   levothyroxine (SYNTHROID) 112 MCG tablet Take 1 tablet by mouth once daily 90 tablet 0   pravastatin (PRAVACHOL) 10 MG tablet Take 1 tablet by mouth once daily 30 tablet 0   No current facility-administered medications on file prior to visit.     Review of Systems  Constitutional:  Negative for chills and fever.  Respiratory:  Negative for cough, shortness of breath and wheezing.   Cardiovascular:  Negative for chest pain, palpitations and leg swelling.  Neurological:  Negative for light-headedness and headaches.       Objective:   Vitals:   01/23/22 0925  BP: 116/80  Pulse: 75  Temp: 98.1 F (36.7 C)  SpO2: 97%   BP Readings from Last 3 Encounters:  01/23/22 116/80  01/10/22 115/82  11/25/21 (!) 108/53   Wt Readings from Last 3 Encounters:  01/23/22 208 lb (94.3 kg)  01/10/22 207 lb (93.9 kg)  12/27/21 207 lb (93.9 kg)   Body mass index is 38.04 kg/m.    Physical Exam Constitutional:      General: She is not in acute distress.    Appearance: Normal appearance.  HENT:     Head: Normocephalic and  atraumatic.  Eyes:     Conjunctiva/sclera: Conjunctivae normal.  Cardiovascular:     Rate and Rhythm: Normal rate and regular rhythm.     Heart sounds: Normal heart sounds. No murmur heard. Pulmonary:     Effort: Pulmonary effort is normal. No respiratory distress.     Breath sounds: Normal breath sounds. No wheezing.  Musculoskeletal:     Cervical back: Neck supple.     Right lower leg: No edema.     Left lower leg: No edema.  Lymphadenopathy:     Cervical: No cervical adenopathy.  Skin:    General: Skin is warm and dry.     Findings: No rash.  Neurological:     Mental Status: She is alert. Mental status is at baseline.  Psychiatric:        Mood and Affect: Mood normal.        Behavior: Behavior normal.        Lab Results  Component Value Date   WBC 8.5 07/24/2021   HGB 12.2 07/24/2021   HCT 36.8 07/24/2021   PLT 218.0 07/24/2021   GLUCOSE 95 07/24/2021   CHOL 249 (H) 07/24/2021   TRIG 169.0 (H) 07/24/2021   HDL 58.90 07/24/2021   LDLDIRECT 148.0 07/03/2017   LDLCALC 156 (H) 07/24/2021  ALT 10 07/24/2021   AST 22 07/24/2021   NA 139 07/24/2021   K 3.9 07/24/2021   CL 101 07/24/2021   CREATININE 0.68 07/24/2021   BUN 16 07/24/2021   CO2 32 07/24/2021   TSH 1.03 07/24/2021   HGBA1C 6.4 07/24/2021     Assessment & Plan:    See Problem List for Assessment and Plan of chronic medical problems.

## 2022-01-22 NOTE — Patient Instructions (Addendum)
   Consider getting the shingles vaccine at your pharmacy if you have not already had it.    Blood work was ordered.      Medications changes include :   None     Return in about 6 months (around 07/25/2022) for Physical Exam.

## 2022-01-23 ENCOUNTER — Encounter: Payer: Self-pay | Admitting: Internal Medicine

## 2022-01-23 ENCOUNTER — Ambulatory Visit (INDEPENDENT_AMBULATORY_CARE_PROVIDER_SITE_OTHER): Payer: PPO | Admitting: Internal Medicine

## 2022-01-23 VITALS — BP 116/80 | HR 75 | Temp 98.1°F | Ht 62.0 in | Wt 208.0 lb

## 2022-01-23 DIAGNOSIS — E782 Mixed hyperlipidemia: Secondary | ICD-10-CM

## 2022-01-23 DIAGNOSIS — I10 Essential (primary) hypertension: Secondary | ICD-10-CM

## 2022-01-23 DIAGNOSIS — E039 Hypothyroidism, unspecified: Secondary | ICD-10-CM | POA: Diagnosis not present

## 2022-01-23 DIAGNOSIS — F411 Generalized anxiety disorder: Secondary | ICD-10-CM

## 2022-01-23 DIAGNOSIS — R7303 Prediabetes: Secondary | ICD-10-CM

## 2022-01-23 LAB — LIPID PANEL
Cholesterol: 205 mg/dL — ABNORMAL HIGH (ref 0–200)
HDL: 52 mg/dL (ref >39.00)
NonHDL: 152.76
Total CHOL/HDL Ratio: 4
Triglycerides: 227 mg/dL — ABNORMAL HIGH (ref 0.0–149.0)
VLDL: 45.4 mg/dL — ABNORMAL HIGH (ref 0.0–40.0)

## 2022-01-23 LAB — COMPREHENSIVE METABOLIC PANEL
ALT: 10 U/L (ref 0–35)
AST: 21 U/L (ref 0–37)
Albumin: 4.2 g/dL (ref 3.5–5.2)
Alkaline Phosphatase: 68 U/L (ref 39–117)
BUN: 16 mg/dL (ref 6–23)
CO2: 32 mEq/L (ref 19–32)
Calcium: 9.9 mg/dL (ref 8.4–10.5)
Chloride: 102 mEq/L (ref 96–112)
Creatinine, Ser: 0.63 mg/dL (ref 0.40–1.20)
GFR: 88.95 mL/min (ref >60.00)
Glucose, Bld: 92 mg/dL (ref 70–99)
Potassium: 3.8 mEq/L (ref 3.5–5.1)
Sodium: 140 mEq/L (ref 135–145)
Total Bilirubin: 0.4 mg/dL (ref 0.2–1.2)
Total Protein: 7.3 g/dL (ref 6.0–8.3)

## 2022-01-23 LAB — TSH: TSH: 0.32 u[IU]/mL — ABNORMAL LOW (ref 0.35–5.50)

## 2022-01-23 LAB — LDL CHOLESTEROL, DIRECT: Direct LDL: 127 mg/dL

## 2022-01-23 LAB — HEMOGLOBIN A1C: Hgb A1c MFr Bld: 6.4 % (ref 4.6–6.5)

## 2022-01-23 NOTE — Assessment & Plan Note (Signed)
Chronic Blood pressure well controlled CMP Continue bisoprolol-HCT 5-6.25 mg daily

## 2022-01-23 NOTE — Assessment & Plan Note (Addendum)
Chronic Regular exercise and healthy diet encouraged Check lipid panel  Continue pravastatin 10 mg daily 

## 2022-01-23 NOTE — Assessment & Plan Note (Signed)
Chronic Management per psychiatry On Lexapro 20 mg daily

## 2022-01-23 NOTE — Assessment & Plan Note (Signed)
Chronic Check a1c Low sugar / carb diet Stressed regular exercise  

## 2022-01-23 NOTE — Assessment & Plan Note (Signed)
Chronic  Clinically euthyroid Currently taking levothyroxine 112 mcg daily Check tsh  Titrate med dose if needed

## 2022-01-24 ENCOUNTER — Ambulatory Visit: Payer: PPO | Admitting: Obstetrics and Gynecology

## 2022-01-29 ENCOUNTER — Other Ambulatory Visit: Payer: Self-pay | Admitting: Internal Medicine

## 2022-01-29 MED ORDER — PRAVASTATIN SODIUM 20 MG PO TABS: 20.0000 mg | ORAL_TABLET | Freq: Every day | ORAL | 1 refills | Status: DC

## 2022-01-31 ENCOUNTER — Ambulatory Visit: Payer: Self-pay | Admitting: Obstetrics and Gynecology

## 2022-02-07 ENCOUNTER — Ambulatory Visit: Payer: PPO | Admitting: Obstetrics and Gynecology

## 2022-02-10 ENCOUNTER — Ambulatory Visit (INDEPENDENT_AMBULATORY_CARE_PROVIDER_SITE_OTHER): Payer: PPO

## 2022-02-10 DIAGNOSIS — Z0000 Encounter for general adult medical examination without abnormal findings: Secondary | ICD-10-CM

## 2022-02-10 DIAGNOSIS — Z1231 Encounter for screening mammogram for malignant neoplasm of breast: Secondary | ICD-10-CM | POA: Diagnosis not present

## 2022-02-10 NOTE — Progress Notes (Signed)
Subjective:   Paige [name] is a 71 y.o. female who presents for Medicare Annual (Subsequent) preventive examination.   I connected with Fanny Calleros today by telephone and verified that I am speaking with the correct person using two identifiers. Location patient: home Location provider: work Persons participating in the virtual visit: patient, provider.   I discussed the limitations, risks, security and privacy concerns of performing an evaluation and management service by telephone and the availability of in person appointments. I also discussed with the patient that there may be a patient responsible charge related to this service. The patient expressed understanding and verbally consented to this telephonic visit.    Interactive audio and video telecommunications were attempted between this provider and patient, however failed, due to patient having technical difficulties OR patient did not have access to video capability.  We continued and completed visit with audio only.    Review of Systems     Cardiac Risk Factors include: advanced age (>27men, >47 women)     Objective:    Today's Vitals   There is no height or weight on file to calculate BMI.     02/10/2022   10:00 AM 01/31/2021    1:04 PM 01/05/2020    2:58 PM 02/11/2018   10:48 AM 09/01/2016   10:30 AM  Advanced Directives  Does Patient Have a Medical Advance Directive? No No No No No  Would patient like information on creating a medical advance directive? No - Patient declined No - Patient declined  Yes (ED - Information included in AVS)     Current Medications (verified) Outpatient Encounter Medications as of 02/10/2022  Medication Sig   bisoprolol-hydrochlorothiazide (ZIAC) 5-6.25 MG tablet Take 1 tablet by mouth once daily   escitalopram (LEXAPRO) 20 MG tablet Take 1 tablet (20 mg total) by mouth daily.   Eszopiclone 3 MG TABS Take 1 tablet (3 mg total) by mouth at bedtime as needed. Take immediately  before bedtime   levothyroxine (SYNTHROID) 112 MCG tablet Take 1 tablet by mouth once daily   pravastatin (PRAVACHOL) 20 MG tablet Take 1 tablet (20 mg total) by mouth daily.   No facility-administered encounter medications on file as of 02/10/2022.    Allergies (verified) Asa [aspirin], Augmentin [amoxicillin-pot clavulanate], Crestor [rosuvastatin], and Morphine and related   History: Past Medical History:  Diagnosis Date   ADD (attention deficit disorder)    Pt has hard time focusing   Anxiety    on meds   Arthritis    Carpal tunnel syndrome    Colon polyp    Constipation    Depression    Gallstones    GERD (gastroesophageal reflux disease)    on meds   Hyperlipidemia    on meds   Hypertension    on meds   Status post dilation of esophageal narrowing    Thyroid disease    on meds   Past Surgical History:  Procedure Laterality Date   ABDOMINAL HYSTERECTOMY     CARPAL TUNNEL RELEASE Left    CHOLECYSTECTOMY     CLOSED REDUCTION HAND FRACTURE     right hand   COLONOSCOPY  2018   JP-MAC-suprep(exc)-int hems-5 yr recall-   COLONOSCOPY  2013   TA   fx leg     NASAL SINUS SURGERY     POLYPECTOMY  2013   TA   SHOULDER SURGERY     reattac tendon; right shoulder   TIBIA FRACTURE SURGERY     steel  rod right leg   TUBAL LIGATION     Family History  Problem Relation Age of Onset   Colon polyps Mother 65   Hyperlipidemia Mother    Colon cancer Mother 30   Lung cancer Father    Leukemia Maternal Aunt    Cancer Paternal Grandfather        mets   Esophageal cancer Neg Hx    Rectal cancer Neg Hx    Stomach cancer Neg Hx    Social History   Socioeconomic History   Marital status: Widowed    Spouse name: Not on file   Number of children: 2   Years of education: Not on file   Highest education level: High school graduate  Occupational History   Occupation: retired    Associate Professor: UNEMPLOYED   Occupation: Orthoptist: [employer]  Tobacco  Use   Smoking status: Never   Smokeless tobacco: Never  Vaping Use   Vaping Use: Never used  Substance and Sexual Activity   Alcohol use: No   Drug use: No   Sexual activity: Not Currently  Other Topics Concern   Not on file  Social History Narrative   Works at [employer] - husband died of lung cancer      No regular exercise   Social Determinants of Health   Financial Resource Strain: Low Risk  (02/10/2022)   Overall Financial Resource Strain (CARDIA)    Difficulty of Paying Living Expenses: Not very hard  Food Insecurity: No Food Insecurity (02/10/2022)   Hunger Vital Sign    Worried About Running Out of Food in the Last Year: Never true    Ran Out of Food in the Last Year: Never true  Transportation Needs: No Transportation Needs (02/10/2022)   PRAPARE - Administrator, Civil Service (Medical): No    Lack of Transportation (Non-Medical): No  Physical Activity: Insufficiently Active (02/10/2022)   Exercise Vital Sign    Days of Exercise per Week: 2 days    Minutes of Exercise per Session: 30 min  Stress: No Stress Concern Present (02/10/2022)   [employer] of Occupational Health - Occupational Stress Questionnaire    Feeling of Stress : Only a little  Social Connections: Socially Isolated (02/10/2022)   Social Connection and Isolation Panel [NHANES]    Frequency of Communication with Friends and Family: Twice a week    Frequency of Social Gatherings with Friends and Family: Twice a week    Attends Religious Services: Never    Database administrator or Organizations: No    Attends Banker Meetings: Never    Marital Status: Widowed    Tobacco Counseling Counseling given: Not Answered   Clinical Intake:  Pre-visit preparation completed: Yes  Pain : No/denies pain     Nutritional Risks: None Diabetes: No  How often do you need to have someone help you when you read instructions, pamphlets, or other written  materials from your doctor or pharmacy?: 1 - Never What is the last grade level you completed in school?: high school  Diabetic?no   Interpreter Needed?: No  Information entered by :: L.Shamaya Kauer,LPN   Activities of Daily Living    02/10/2022   10:03 AM  In your present state of health, do you have any difficulty performing the following activities:  Hearing? 0  Vision? 0  Difficulty concentrating or making decisions? 0  Walking or climbing stairs? 0  Dressing or bathing? 0  Doing errands, shopping? 0  Preparing Food and eating ? N  Using the Toilet? N  In the past six months, have you accidently leaked urine? N  Do you have problems with loss of bowel control? N  Managing your Medications? N  Managing your Finances? N    Patient Care Team: Pincus Sanes, MD as PCP - General (Internal Medicine) Archer Asa, MD as Consulting Physician (Psychiatry) Dominica Severin, MD as Consulting Physician (Orthopedic Surgery) Nelson Chimes, MD as Consulting Physician (Ophthalmology) Kathyrn Sheriff, [facility] as Pharmacist (Pharmacist)  Indicate any recent Medical Services you may have received from other than [facility] providers in the past year (date may be approximate).     Assessment:   This is a routine wellness examination for Avni.  Hearing/Vision screen Vision Screening - Comments:: Annual eye exams wears glasses   Dietary issues and exercise activities discussed: Current Exercise Habits: Home exercise routine, Type of exercise: walking, Time (Minutes): 30, Frequency (Times/Week): 2, Weekly Exercise (Minutes/Week): 60, Intensity: Mild, Exercise limited by: None identified   Goals Addressed             This Visit's Progress    Patient Stated   On track    Lose weight by decrease eating sweets, breads, eat more fruit and veg. Increase physical activity by starting chair exercises.        Depression Screen    02/10/2022   10:01 AM 02/10/2022    9:58 AM 01/23/2022     9:33 AM 10/11/2021    8:17 AM 07/24/2021    3:36 PM 01/31/2021    1:01 PM 01/21/2021    4:09 PM  PHQ 2/9 Scores  PHQ - 2 Score 0 0 2 1 2 3 3   PHQ- 9 Score   9  7  13     Fall Risk    02/10/2022   10:01 AM 01/23/2022    9:34 AM 01/23/2022    9:33 AM 10/11/2021    8:17 AM 01/31/2021    1:06 PM  Fall Risk   Falls in the past year? 0 0 0 0 0  Number falls in past yr: 0 0 0 0 0  Injury with Fall? 0 0 0 0 0  Risk for fall due to :  No Fall Risks No Fall Risks No Fall Risks No Fall Risks  Follow up Falls evaluation completed;Education provided Falls evaluation completed Falls evaluation completed Falls evaluation completed Falls evaluation completed    FALL RISK PREVENTION PERTAINING TO THE HOME:  Any stairs in or around the home? No  If so, are there any without handrails? No  Home free of loose throw rugs in walkways, pet beds, electrical cords, etc? Yes  Adequate lighting in your home to reduce risk of falls? Yes   ASSISTIVE DEVICES UTILIZED TO PREVENT FALLS:  Life alert? No  Use of a cane, walker or w/c? No  Grab bars in the bathroom? Yes  Shower chair or bench in shower? No  Elevated toilet seat or a handicapped toilet? No     Cognitive Function:Normal cognitive status assessed by telephone conversation by this Nurse Health Advisor. No abnormalities found.      02/11/2018   11:06 AM  MMSE - Mini Mental State Exam  Orientation to time 5  Orientation to Place 5  Registration 3  Attention/ Calculation 5  Recall 1  Language- name 2 objects 2  Language- repeat 1  Language- follow 3 step command 3  Language- read & follow direction 1  Write a sentence 1  Copy design 1  Total score 28        Immunizations Immunization History  Administered Date(s) Administered   Fluad Quad(high Dose 65+) 05/16/2019, 06/16/2020, 06/08/2021   Influenza, High Dose Seasonal PF 06/27/2016, 07/03/2017, 07/05/2018   PFIZER(Purple Top)SARS-COV-2 Vaccination 09/22/2019, 10/13/2019, 06/02/2020    Pfizer Covid-19 Vaccine Bivalent Booster 74yrs & up 05/16/2021   Pneumococcal Conjugate-13 07/18/2016   Pneumococcal Polysaccharide-23 05/25/2015   Tdap 03/03/2012, 02/10/2019    TDAP status: Up to date  Flu Vaccine status: Up to date  Pneumococcal vaccine status: Up to date  Covid-19 vaccine status: Completed vaccines  Qualifies for Shingles Vaccine? Yes   Zostavax completed Yes   Shingrix Completed?: Yes  Screening Tests Health Maintenance  Topic Date Due   Zoster Vaccines- Shingrix (1 of 2) Never done   MAMMOGRAM  02/20/2022   INFLUENZA VACCINE  03/18/2022   DEXA SCAN  09/04/2025   COLONOSCOPY (Pts 45-72yrs Insurance coverage will need to be confirmed)  01/11/2027   TETANUS/TDAP  02/09/2029   Pneumonia Vaccine 55+ Years old  Completed   COVID-19 Vaccine  Completed   Hepatitis C Screening  Completed   HPV VACCINES  Aged Out    Health Maintenance  Health Maintenance Due  Topic Date Due   Zoster Vaccines- Shingrix (1 of 2) Never done    Colorectal cancer screening: Type of screening: Colonoscopy. Completed 01/10/2022. Repeat every 5 years  Mammogram status: Completed 02/20/2021. Repeat every year  Bone Density status: Completed 09/04/2020. Results reflect: Bone density results: OSTEOPENIA. Repeat every 5 years.  Lung Cancer Screening: (Low Dose CT Chest recommended if Age 64-80 years, 30 pack-year currently smoking OR have quit w/in 15years.) does not qualify.   Lung Cancer Screening Referral: n/a  Additional Screening:  Hepatitis C Screening: does not qualify;   Vision Screening: Recommended annual ophthalmology exams for early detection of glaucoma and other disorders of the eye. Is the patient up to date with their annual eye exam?  Yes  Who is the provider or what is the name of the office in which the patient attends annual eye exams? [facility]  If pt is not established with a provider, would they like to be referred to a provider to establish care?  No .   Dental Screening: Recommended annual dental exams for proper oral hygiene  Community Resource Referral / Chronic Care Management: CRR required this visit?  No   CCM required this visit?  No      Plan:     I have personally reviewed and noted the following in the patient's chart:   Medical and social history Use of alcohol, tobacco or illicit drugs  Current medications and supplements including opioid prescriptions.  Functional ability and status Nutritional status Physical activity Advanced directives List of other physicians Hospitalizations, surgeries, and ER visits in previous 12 months Vitals Screenings to include cognitive, depression, and falls Referrals and appointments  In addition, I have reviewed and discussed with patient certain preventive protocols, quality metrics, and best practice recommendations. A written personalized care plan for preventive services as well as general preventive health recommendations were provided to patient.     March Rummage, LPN   9/81/1915   Nurse Notes: none

## 2022-02-26 ENCOUNTER — Encounter: Payer: Self-pay | Admitting: Obstetrics and Gynecology

## 2022-02-26 ENCOUNTER — Ambulatory Visit (INDEPENDENT_AMBULATORY_CARE_PROVIDER_SITE_OTHER): Payer: PPO | Admitting: Obstetrics and Gynecology

## 2022-02-26 VITALS — BP 103/67 | HR 67

## 2022-02-26 DIAGNOSIS — N3281 Overactive bladder: Secondary | ICD-10-CM

## 2022-02-26 NOTE — Progress Notes (Signed)
 Urogynecology  PTNS VISIT  CC:  Overactive bladder  71 y.o. with refractory overactive bladder who presents for percutaneous tibial nerve stimulation. The patient presents for PTNS session # 1. She forgot to bring her baseline bladder diary with her today.   Procedure: The patient spontaneously voided prior to beginning the procedure. The patient was placed in the sitting position and the left lower extremity was prepped in the usual fashion. The PTNS needle was then inserted at a 60 degree angle, 5 cm cephalad and 2 cm posterior to the medial malleolus. The PTNS unit was then programmed and an optimal response was noted at a setting of 10 milliamps. The PTNS stimulation was then performed at this setting for 30 minutes without incident and the patient tolerated the procedure well. The needle was removed and hemostasis was noted.   The pt will return in 1 weeks for PTNS session # 2. She will bring her baseline bladder diary with her to her next session.  All questions were answered.  Jaquita Folds, MD

## 2022-03-03 ENCOUNTER — Telehealth: Payer: PPO

## 2022-03-07 ENCOUNTER — Ambulatory Visit (INDEPENDENT_AMBULATORY_CARE_PROVIDER_SITE_OTHER): Payer: PPO | Admitting: Obstetrics and Gynecology

## 2022-03-07 ENCOUNTER — Encounter: Payer: Self-pay | Admitting: Obstetrics and Gynecology

## 2022-03-07 VITALS — BP 97/63 | HR 73

## 2022-03-07 DIAGNOSIS — N3281 Overactive bladder: Secondary | ICD-10-CM

## 2022-03-07 NOTE — Progress Notes (Signed)
[facility]  PTNS VISIT  CC:  Overactive bladder  71 y.o. with refractory overactive bladder who presents for percutaneous tibial nerve stimulation. The patient presents for PTNS session # 2. Baseline bladder diary brought today and scanned into epic  Procedure: The patient was placed in the sitting position and the left lower extremity was prepped in the usual fashion. The PTNS needle was then inserted at a 60 degree angle, 5 cm cephalad and 2 cm posterior to the medial malleolus. The PTNS unit was then programmed and an optimal response was noted at a setting of 10 milliamps. The PTNS stimulation was then performed at this setting for 30 minutes without incident and the patient tolerated the procedure well. The needle was removed and hemostasis was noted.   The pt will return in 1 weeks for PTNS session # 3.   Jaquita Folds, MD

## 2022-03-12 ENCOUNTER — Ambulatory Visit (INDEPENDENT_AMBULATORY_CARE_PROVIDER_SITE_OTHER): Payer: PPO | Admitting: Obstetrics and Gynecology

## 2022-03-12 ENCOUNTER — Encounter: Payer: Self-pay | Admitting: Obstetrics and Gynecology

## 2022-03-12 VITALS — BP 108/49 | HR 95

## 2022-03-12 DIAGNOSIS — N3281 Overactive bladder: Secondary | ICD-10-CM

## 2022-03-12 NOTE — Progress Notes (Signed)
[facility]  PTNS VISIT  CC:  Overactive bladder  71 y.o. with refractory overactive bladder who presents for percutaneous tibial nerve stimulation. The patient presents for PTNS session # 3.  Procedure: The patient was placed in the sitting position and the left lower extremity was prepped in the usual fashion. The PTNS needle was then inserted at a 60 degree angle, 5 cm cephalad and 2 cm posterior to the medial malleolus. The PTNS unit was then programmed and an optimal response was noted at a setting of 10 milliamps. The PTNS stimulation was then performed at this setting for 30 minutes without incident and the patient tolerated the procedure well. The needle was removed and hemostasis was noted.   The pt will return in 1 weeks for PTNS session # 4  Jaquita Folds, MD

## 2022-03-17 ENCOUNTER — Ambulatory Visit
Admission: RE | Admit: 2022-03-17 | Discharge: 2022-03-17 | Disposition: A | Discharge status: Home / Self Care | Payer: PPO | Source: Ambulatory Visit | Attending: Internal Medicine | Admitting: Internal Medicine

## 2022-03-17 DIAGNOSIS — Z1231 Encounter for screening mammogram for malignant neoplasm of breast: Secondary | ICD-10-CM | POA: Diagnosis not present

## 2022-03-19 ENCOUNTER — Encounter: Payer: Self-pay | Admitting: Obstetrics and Gynecology

## 2022-03-19 ENCOUNTER — Ambulatory Visit (INDEPENDENT_AMBULATORY_CARE_PROVIDER_SITE_OTHER): Payer: PPO | Admitting: Obstetrics and Gynecology

## 2022-03-19 VITALS — BP 119/73 | HR 59

## 2022-03-19 DIAGNOSIS — N3281 Overactive bladder: Secondary | ICD-10-CM

## 2022-03-19 NOTE — Progress Notes (Signed)
[facility]  PTNS VISIT  CC:  Overactive bladder  71 y.o. with refractory overactive bladder who presents for percutaneous tibial nerve stimulation. The patient presents for PTNS session # 4.  Procedure: The patient was placed in the sitting position and the left lower extremity was prepped in the usual fashion. The PTNS needle was then inserted at a 60 degree angle, 5 cm cephalad and 2 cm posterior to the medial malleolus. The PTNS unit was then programmed and an optimal response was noted at a setting of 10 milliamps. The PTNS stimulation was then performed at this setting for 30 minutes without incident and the patient tolerated the procedure well. The needle was removed and hemostasis was noted.   The pt will return in 1 weeks for PTNS session # 5  Jaquita Folds, MD

## 2022-03-26 ENCOUNTER — Encounter: Payer: Self-pay | Admitting: Obstetrics and Gynecology

## 2022-03-26 ENCOUNTER — Ambulatory Visit (INDEPENDENT_AMBULATORY_CARE_PROVIDER_SITE_OTHER): Payer: PPO | Admitting: Obstetrics and Gynecology

## 2022-03-26 VITALS — BP 102/67 | HR 68

## 2022-03-26 DIAGNOSIS — N3281 Overactive bladder: Secondary | ICD-10-CM

## 2022-03-26 NOTE — Progress Notes (Signed)
[facility]  PTNS VISIT  CC:  Overactive bladder  71 y.o. with refractory overactive bladder who presents for percutaneous tibial nerve stimulation. The patient presents for PTNS session # 5.  Procedure: The patient was placed in the sitting position and the right lower extremity was prepped in the usual fashion. The PTNS needle was then inserted at a 60 degree angle, 5 cm cephalad and 2 cm posterior to the medial malleolus. The PTNS unit was then programmed and an optimal response was noted at a setting of 10 milliamps. The PTNS stimulation was then performed at this setting for 30 minutes without incident and the patient tolerated the procedure well. The needle was removed and hemostasis was noted.   The pt will return in 1 weeks for PTNS session # 6  Jaquita Folds, MD

## 2022-04-04 ENCOUNTER — Encounter: Payer: Self-pay | Admitting: Obstetrics and Gynecology

## 2022-04-04 ENCOUNTER — Ambulatory Visit (INDEPENDENT_AMBULATORY_CARE_PROVIDER_SITE_OTHER): Payer: PPO | Admitting: Obstetrics and Gynecology

## 2022-04-04 VITALS — BP 101/62 | HR 80

## 2022-04-04 DIAGNOSIS — N3281 Overactive bladder: Secondary | ICD-10-CM | POA: Diagnosis not present

## 2022-04-04 NOTE — Progress Notes (Signed)
[facility]  PTNS VISIT  CC:  Overactive bladder  71 y.o. with refractory overactive bladder who presents for percutaneous tibial nerve stimulation. The patient presents for PTNS session # 6.  Procedure: The patient was placed in the sitting position and the left lower extremity was prepped in the usual fashion. The PTNS needle was then inserted at a 60 degree angle, 5 cm cephalad and 2 cm posterior to the medial malleolus. The PTNS unit was then programmed and an optimal response was noted at a setting of 11 milliamps. The PTNS stimulation was then performed at this setting for 30 minutes without incident and the patient tolerated the procedure well. The needle was removed and hemostasis was noted.   The pt will return in 1 weeks for PTNS session # 7. She will fill out a 3 day bladder diary before next session.   Jaquita Folds, MD

## 2022-04-09 ENCOUNTER — Ambulatory Visit (INDEPENDENT_AMBULATORY_CARE_PROVIDER_SITE_OTHER): Payer: PPO | Admitting: Obstetrics and Gynecology

## 2022-04-09 ENCOUNTER — Encounter: Payer: Self-pay | Admitting: Obstetrics and Gynecology

## 2022-04-09 VITALS — BP 117/68 | HR 76

## 2022-04-09 DIAGNOSIS — N3281 Overactive bladder: Secondary | ICD-10-CM | POA: Diagnosis not present

## 2022-04-09 MED ORDER — VIBEGRON 75 MG PO TABS: 75.0000 mg | ORAL_TABLET | Freq: Every day | ORAL | 5 refills | Status: DC

## 2022-04-09 NOTE — Progress Notes (Signed)
[facility] Return Visit  SUBJECTIVE  History of Present Illness: Paige [name] is a 71 y.o. female seen in follow-up for overactive bladder.   She was scheduled for her next PTNS session today. She brought her mid-point bladder diary and we reviewed together. She does not feel she has seen any improvement in her symptoms. She is still going frequently to the bathroom- between 8-12 times per day. She will sometimes have leakage with urgency.   Past Medical History: Patient  has a past medical history of ADD (attention deficit disorder), Anxiety, Arthritis, Carpal tunnel syndrome, Colon polyp, Constipation, Depression, Gallstones, GERD (gastroesophageal reflux disease), Hyperlipidemia, Hypertension, Status post dilation of esophageal narrowing, and Thyroid disease.   Past Surgical History: She  has a past surgical history that includes Cholecystectomy; Abdominal hysterectomy; Tubal ligation; fx leg; Nasal sinus surgery; Closed reduction hand fracture; Carpal tunnel release (Left); Tibia fracture surgery; Shoulder surgery; Colonoscopy (2018); Colonoscopy (2013); and Polypectomy (2013).   Medications: She has a current medication list which includes the following prescription(s): bisoprolol-hydrochlorothiazide, escitalopram, eszopiclone, levothyroxine, pravastatin, and vibegron.   Allergies: Patient is allergic to asa [aspirin], augmentin [amoxicillin-pot clavulanate], crestor [rosuvastatin], and morphine and related.   Social History: Patient  reports that she has never smoked. She has never used smokeless tobacco. She reports that she does not drink alcohol and does not use drugs.      OBJECTIVE     Physical Exam: Vitals:   04/09/22 1313  BP: 117/68  Pulse: 76   Gen: No apparent distress, A&O x 3.  Detailed Urogynecologic Evaluation:  Deferred.    ASSESSMENT AND PLAN    [name] is a 71 y.o. with:  1. Overactive bladder    - Reviewed options of  pelvic PT, another medication, intravesical botox or sacral neuomodulation.  - For now, she would like to try another medication. Previously was on Myrbetriq and Trospium. Was unable to get [facility] covered before, but we can give samples and try for prior authorization. She is contraindicated from other anticholinergics due to her age.  - Handouts were provided on botox and SNM.   Return 6 weeks  Jaquita Folds, MD

## 2022-04-15 ENCOUNTER — Other Ambulatory Visit: Payer: Self-pay | Admitting: Internal Medicine

## 2022-04-16 ENCOUNTER — Ambulatory Visit: Payer: Self-pay | Admitting: Obstetrics and Gynecology

## 2022-04-23 NOTE — Progress Notes (Signed)
Submitted PA for Gemtesa '75mg'$  on Cover my Meds. Key: [id number] PA Case ID: [id number] Outcome: PENDING  --- Approved from 04/29/22-08/17/2022

## 2022-04-28 ENCOUNTER — Encounter: Payer: Self-pay | Admitting: Internal Medicine

## 2022-04-28 ENCOUNTER — Ambulatory Visit: Payer: Self-pay | Admitting: Obstetrics and Gynecology

## 2022-04-28 NOTE — Progress Notes (Unsigned)
    Subjective:    Patient ID: Paige [name], female    DOB: April 13, 1951, 71 y.o.   MRN: [id number]      HPI Paige [name] is here for No chief complaint on file.    Swelling in left hand -     Medications and allergies reviewed with patient and updated if appropriate.  Current Outpatient Medications on File Prior to Visit  Medication Sig Dispense Refill   bisoprolol-hydrochlorothiazide (ZIAC) 5-6.25 MG tablet Take 1 tablet by mouth once daily 90 tablet 0   escitalopram (LEXAPRO) 20 MG tablet Take 1 tablet (20 mg total) by mouth daily. 30 tablet 7   Eszopiclone 3 MG TABS Take 1 tablet (3 mg total) by mouth at bedtime as needed. Take immediately before bedtime 30 tablet 5   levothyroxine (SYNTHROID) 112 MCG tablet Take 1 tablet by mouth once daily 90 tablet 0   pravastatin (PRAVACHOL) 20 MG tablet Take 1 tablet (20 mg total) by mouth daily. 90 tablet 1   Vibegron 75 MG TABS Take 75 mg by mouth daily. 30 tablet 5   No current facility-administered medications on file prior to visit.    Review of Systems     Objective:  There were no vitals filed for this visit. BP Readings from Last 3 Encounters:  04/09/22 117/68  04/04/22 101/62  03/26/22 102/67   Wt Readings from Last 3 Encounters:  01/23/22 208 lb (94.3 kg)  01/10/22 207 lb (93.9 kg)  12/27/21 207 lb (93.9 kg)   There is no height or weight on file to calculate BMI.    Physical Exam         Assessment & Plan:    See Problem List for Assessment and Plan of chronic medical problems.

## 2022-04-29 ENCOUNTER — Ambulatory Visit (INDEPENDENT_AMBULATORY_CARE_PROVIDER_SITE_OTHER): Payer: PPO | Admitting: Internal Medicine

## 2022-04-29 DIAGNOSIS — I10 Essential (primary) hypertension: Secondary | ICD-10-CM | POA: Diagnosis not present

## 2022-04-29 DIAGNOSIS — M11242 Other chondrocalcinosis, left hand: Secondary | ICD-10-CM

## 2022-04-29 HISTORY — DX: Other chondrocalcinosis, left hand: M11.242

## 2022-04-29 MED ORDER — PRAVASTATIN SODIUM 20 MG PO TABS: 20.0000 mg | ORAL_TABLET | Freq: Every day | ORAL | 1 refills | Status: DC

## 2022-04-29 MED ORDER — PREDNISONE 10 MG PO TABS: ORAL_TABLET | ORAL | 0 refills | Status: AC

## 2022-04-29 NOTE — Assessment & Plan Note (Signed)
Chronic BP well controlled Continue ziac 5-6.25 mg daily

## 2022-04-29 NOTE — Patient Instructions (Addendum)
Medications changes include :   prednsione taper   Your prescription(s) have been sent to your pharmacy.    Return if symptoms worsen or fail to improve.    Calcium Pyrophosphate Deposition Disease Calcium pyrophosphate deposition disease (CPPD) is a type of arthritis that causes pain, swelling, and inflammation in a joint. Attacks of CPPD may come and go. The joint pain can be severe and may last for days to weeks. This condition usually affects one joint at a time. The knees are most often affected, but this condition can also affect the wrists, elbows, shoulders, or ankles. CPPD may also be called pseudogout because it is similar to gout. Both conditions result from the buildup of crystals in a joint. However, CPPD is caused by a type of crystal that is different from the crystals that cause gout. What are the causes? This condition is caused by the buildup of calcium pyrophosphate dihydrate crystals in a joint. The reason why this buildup occurs is not known. An increased likelihood of having this condition (predisposition) may be passed from parent to child (is hereditary). What increases the risk? This condition is more likely to develop in people who: Are older than 72 years of age. Have a family history of CPPD. Have certain medical conditions, such as hemophilia, amyloidosis, or overactive parathyroid glands. Have had a previous joint injury or surgery on a joint. Have low levels of magnesium in the blood. What are the signs or symptoms? Symptoms of this condition include: Joint pain. The pain may: Be intense and constant. Develop quickly. Get worse with movement. Last from several days to a few weeks. Redness, swelling, stiffness, and warmth at the joint. Fever. How is this diagnosed? To diagnose this condition, your health care provider will use a needle to remove fluid from the joint. The fluid will be examined for the crystals that cause CPPD. You also may have  additional tests, such as: Blood tests. X-rays. Ultrasound. MRI. How is this treated? There is no cure for this condition. However, treatment can relieve symptoms and improve joint function. Treatment may include: NSAIDs to reduce inflammation and pain, such as ibuprofen. Removing some of the fluid from around the joint with a needle. Injections of medicine (cortisone) into the joint to reduce pain and swelling. Medicines to help prevent attacks. Physical therapy exercises to improve movement and strength in the joint. Follow these instructions at home: Managing pain, stiffness, and swelling  Rest the affected joint until your symptoms start to go away. If directed, put ice on the affected area to relieve pain and swelling. To do this: Put ice in a plastic bag. Place a towel between your skin and the bag. Leave the ice on for 20 minutes, 2-3 times a day. Remove the ice if your skin turns bright red. This is very important. If you cannot feel pain, heat, or cold, you have a greater risk of damage to the area. Keep your affected joint raised (elevated) above the level of your heart, when possible. This will help to reduce swelling. For example, prop your foot up on a chair while sitting down to elevate your knee. General instructions If the painful joint is in your leg, use crutches as told by your health care provider. Take over-the-counter and prescription medicines only as told by your health care provider. When your symptoms start to go away, begin to exercise regularly or do physical therapy. Talk with your health care provider or physical therapist about  what types of exercise are safe for you. Exercise that is easier on your joints (low-impact exercise) may be best. This includes walking, swimming, bicycling, and water aerobics. Maintain a healthy weight. Excess weight puts stress on your joints. Keep all follow-up visits. This is important. Where to find more information Arthritis  Foundation: www.arthritis.org Contact a health care provider if: Your pain or other symptoms get worse. You develop a skin rash. You have chills or a fever. You have side effects from your medicines. Summary Calcium pyrophosphate deposition (CPPD) is a type of arthritis that causes pain, swelling, and inflammation in a joint. The knees are most often affected, but CPPD can also affect the wrists, elbows, shoulders, or ankles. CPPD is caused by the buildup of calcium crystals in a joint. The reason why this occurs is not known. Attacks of CPPD may come and go. The joint pain can be severe and may last for days to weeks. There is no way to remove the crystals from the joint and no cure for this condition. However, treatment can relieve symptoms and improve joint function. Rest the affected joint until your symptoms start to go away. After your symptoms go away, begin to exercise regularly or do physical therapy. This information is not intended to replace advice given to you by your health care provider. Make sure you discuss any questions you have with your health care provider. Document Revised: 05/08/2021 Document Reviewed: 05/08/2021 Elsevier Patient Education  [place].

## 2022-04-29 NOTE — Assessment & Plan Note (Signed)
Acute Has had two episodes prior - left wrist, left knee Symptoms and exam c/w pseudogout - started 1 week ago Start prednisone taper - 30 mg x 3 days, 20 mg x 3 days, 10 mg x 3 days Discussed daily preventative medication - she deferred for now - will discuss at routine appt in 3 months

## 2022-05-06 ENCOUNTER — Ambulatory Visit: Payer: Self-pay | Admitting: Obstetrics and Gynecology

## 2022-05-06 ENCOUNTER — Telehealth (HOSPITAL_BASED_OUTPATIENT_CLINIC_OR_DEPARTMENT_OTHER): Payer: PPO | Admitting: Psychiatry

## 2022-05-06 DIAGNOSIS — F325 Major depressive disorder, single episode, in full remission: Secondary | ICD-10-CM | POA: Diagnosis not present

## 2022-05-06 MED ORDER — ESCITALOPRAM OXALATE 20 MG PO TABS: 20.0000 mg | ORAL_TABLET | Freq: Every day | ORAL | 7 refills | Status: DC

## 2022-05-06 MED ORDER — ESZOPICLONE 3 MG PO TABS: 3.0000 mg | ORAL_TABLET | Freq: Every evening | ORAL | 5 refills | Status: DC | PRN

## 2022-05-06 NOTE — Progress Notes (Signed)
Patient ID: Paige [name], female   DOB: 12-Apr-1951, 71 y.o.   MRN: [id number] Patient ID: Paige [name], female   DOB: 1951/08/01, 71 y.o.   MRN: [id number] [facility] MD Progress Note  05/06/2022 1:28 PM Paige Manges Snuffer   Pa time.st Medical History:   MRN:  [id number] Diagnosis major depressis   Today the patient is doing only fairly well.  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  Today we had a long talk about the importance of staying active and doing things.  She has 2 sons who are doing pretty well.  They are going to come tomorrow night with dinner and bring the 1 child.  She has 1 grandchild.  The patient overall is sleeping and eating well.  She has good energy.  She continues to drive.  She continues to live in 2 in her home.  It is noted the patient is 27 year old mother is still alive and actually drives.  The patient is close with her mother.  The patient denies use of alcohol or drugs.  She takes her medicines just as prescribed.  She likes to cook.  She likes to read and she watches TV but she does not exercise very much.  The patient knows that she is stay active and doing things.  She has a lot of financial stresses from some mistakes she made years ago.  Nonetheless she is paying back and seems to be actually pretty stable functioning well.  At this time the patient i Past Medical History:  Diagnosis Date   ADD (attention deficit disorder)    Pt has hard time focusing   Anxiety    on meds   Arthritis    Carpal tunnel syndrome    Colon polyp    Constipation    Depression    Gallstones    GERD (gastroesophageal reflux disease)    on meds   Hyperlipidemia    on meds   Hypertension    on meds   Status post dilation of esophageal narrowing    Thyroid disease    on meds    Past Surgical History:  Procedure Laterality Date    ABDOMINAL HYSTERECTOMY     CARPAL TUNNEL RELEASE Left    CHOLECYSTECTOMY     CLOSED REDUCTION HAND FRACTURE     right hand   COLONOSCOPY  2018   [facility]-MAC-suprep(exc)-int hems-5 yr recall-   COLONOSCOPY  2013   TA   fx leg     NASAL SINUS SURGERY     POLYPECTOMY  2013   TA   SHOULDER SURGERY     reattac tendon; right shoulder   TIBIA FRACTURE SURGERY     steel rod right leg   TUBAL LIGATION     Family History:  Family History  Problem Relation Age of Onset   Colon polyps Mother 78   Hyperlipidemia Mother    Colon cancer Mother 59   Lung cancer Father    Leukemia Maternal Aunt    Cancer Paternal Grandfather        mets   Esophageal cancer Neg Hx    Rectal cancer Neg Hx    Stomach cancer Neg Hx    Breast cancer Neg Hx    Family Psychiatric  History:  Social History:  Social History   Substance  and Sexual Activity  Alcohol Use No     Social History   Substance and Sexual Activity  Drug Use No    Social History   Socioeconomic History   Marital status: Widowed    Spouse name: Not on file   Number of children: 2   Years of education: Not on file   Highest education level: High school graduate  Occupational History   Occupation: retired    Fish farm manager: UNEMPLOYED   Occupation: Conservation officer, historic buildings: [place]  Tobacco Use   Smoking status: Never   Smokeless tobacco: Never  Vaping Use   Vaping Use: Never used  Substance and Sexual Activity   Alcohol use: No   Drug use: No   Sexual activity: Not Currently  Other Topics Concern   Not on file  Social History Narrative   Works at [employer] - husband died of lung cancer      No regular exercise   Social Determinants of Health   Financial Resource Strain: Low Risk  (02/10/2022)   Overall Financial Resource Strain (CARDIA)    Difficulty of Paying Living Expenses: Not very hard  Food Insecurity: No Food Insecurity (02/10/2022)   Hunger Vital Sign    Worried About  Running Out of Food in the Last Year: Never true    [place] in the Last Year: Never true  Transportation Needs: No Transportation Needs (02/10/2022)   PRAPARE - Hydrologist (Medical): No    Lack of Transportation (Non-Medical): No  Physical Activity: Insufficiently Active (02/10/2022)   Exercise Vital Sign    Days of Exercise per Week: 2 days    Minutes of Exercise per Session: 30 min  Stress: No Stress Concern Present (02/10/2022)   [place]    Feeling of Stress : Only a little  Social Connections: Socially Isolated (02/10/2022)   Social Connection and Isolation Panel [NHANES]    Frequency of Communication with Friends and Family: Twice a week    Frequency of Social Gatherings with Friends and Family: Twice a week    Attends Religious Services: Never    Marine scientist or Organizations: No    Attends Archivist Meetings: Never    Marital Status: Widowed   Additional Social History:                         Sleep: Fair  Appetite:  Good  Current Medications: Current Outpatient Medications  Medication Sig Dispense Refill   bisoprolol-hydrochlorothiazide (ZIAC) 5-6.25 MG tablet Take 1 tablet by mouth once daily 90 tablet 0   escitalopram (LEXAPRO) 20 MG tablet Take 1 tablet (20 mg total) by mouth daily. 30 tablet 7   Eszopiclone 3 MG TABS Take 1 tablet (3 mg total) by mouth at bedtime as needed. Take immediately before bedtime 30 tablet 5   levothyroxine (SYNTHROID) 112 MCG tablet Take 1 tablet by mouth once daily 90 tablet 0   pravastatin (PRAVACHOL) 20 MG tablet Take 1 tablet (20 mg total) by mouth daily. 90 tablet 1   predniSONE (DELTASONE) 10 MG tablet Take 3 tabs po qd x 3 days, then 2 tabs po qd x 3 days, then 1 tab po qd x 3 days 18 tablet 0   Vibegron 75 MG TABS Take 75 mg by mouth daily. 30 tablet 5  No current facility-administered medications  for this visit.    Lab Results: No results found for this or any previous visit (from the past 48 hour(s)).  Blood Alcohol level:  No results found for: "ETH"  Physical Findings: AIMS:  , ,  ,  ,    CIWA:    COWS:     Musculoskeletal: Strength & Muscle Tone: within normal limits Gait & Station: normal Patient leans: N/A  Psychiatric Specialty Exam: ROS  There were no vitals taken for this visit.There is no height or weight on file to calculate BMI.  General Appearance: Fairly Groomed  Engineer, water::  Good  Speech:  Clear and Coherent  Volume:  Normal  Mood:  Euthymic  Affect:  Blunt  Thought Process:  Coherent  Orientation:  Full (Time, Place, and Person)  Thought Content:  WDL  Suicidal Thoughts:  No  Homicidal Thoughts:  No  Memory:  NA  Judgement:  NA  Insight:  Good  Psychomotor Activity:  Decreased  Concentration:  Fair  Recall:  [employer] of Knowledge:Good  Language: Fair  Akathisia:  No  Handed:  Right  AIMS (if indicated):     Assets:  Desire for Improvement  ADL's:  Intact  Cognition: WNL  Sleep:      Treatment Plan Summary: 05/06/2022, 1:28 PM   This patient has 2 problems.  She is major clinical depression and does very well with Lexapro 20 mg.  The patient is going to figure out a way to activate herself and get back into the workforce at least part-time.  Her second problem is insomnia.  She takes Lunesta 3 mg and sleeps well.  Patient will return to see me in 5 months.

## 2022-05-14 ENCOUNTER — Ambulatory Visit: Payer: Self-pay | Admitting: Obstetrics and Gynecology

## 2022-05-26 ENCOUNTER — Ambulatory Visit (INDEPENDENT_AMBULATORY_CARE_PROVIDER_SITE_OTHER): Payer: PPO | Admitting: Obstetrics and Gynecology

## 2022-05-26 ENCOUNTER — Encounter: Payer: Self-pay | Admitting: Obstetrics and Gynecology

## 2022-05-26 VITALS — BP 129/80 | HR 83

## 2022-05-26 DIAGNOSIS — N3281 Overactive bladder: Secondary | ICD-10-CM | POA: Diagnosis not present

## 2022-05-26 NOTE — Progress Notes (Signed)
Paige [name]  SUBJECTIVE  History of Present Illness: Paige [name] is a 71 y.o. female seen in follow-up for overactive bladder.   She has been on Gemtesa samples. She feels it has been working well for her. She has less urinary frequency and less leakage.   She was not able to pick up the prescription since the pharmacy told her it was not active. She got prior authorization approval for the med.   Past Medical History: Patient  has a past medical history of ADD (attention deficit disorder), Anxiety, Arthritis, Carpal tunnel syndrome, Colon polyp, Constipation, Depression, Gallstones, GERD (gastroesophageal reflux disease), Hyperlipidemia, Hypertension, Status post dilation of esophageal narrowing, and Thyroid disease.   Past Surgical History: She  has a past surgical history that includes Cholecystectomy; Abdominal hysterectomy; Tubal ligation; fx leg; Nasal sinus surgery; Closed reduction hand fracture; Carpal tunnel release (Left); Tibia fracture surgery; Shoulder surgery; Colonoscopy (2018); Colonoscopy (2013); and Polypectomy (2013).   Medications: She has a current medication list which includes the following prescription(s): bisoprolol-hydrochlorothiazide, escitalopram, eszopiclone, levothyroxine, pravastatin, and vibegron.   Allergies: Patient is allergic to asa [aspirin], augmentin [amoxicillin-pot clavulanate], crestor [rosuvastatin], and morphine and related.   Social History: Patient  reports that she has never smoked. She has never used smokeless tobacco. She reports that she does not drink alcohol and does not use drugs.      OBJECTIVE     Physical Exam: Vitals:   05/26/22 1314  BP: 129/80  Pulse: 83    Gen: No apparent distress, A&O x 3.  Detailed Urogynecologic Evaluation:  Deferred.    ASSESSMENT AND PLAN    Ms. Walkup is a 71 y.o. with:  1. Overactive bladder     - Continue Gemtesa '75mg'$ .  - Will call pharmacy to  check on prescription and resend if needed. - Again reviewed options of botox and SNM.   Return 6 months or sooner if needed  Jaquita Folds, MD  Time spent: I spent 20 minutes dedicated to the care of this patient on the date of this encounter to include pre-[name] review of records, face-to-face time with the patient  and post [name] documentation.

## 2022-05-27 DIAGNOSIS — D1801 Hemangioma of skin and subcutaneous tissue: Secondary | ICD-10-CM | POA: Diagnosis not present

## 2022-05-27 DIAGNOSIS — D485 Neoplasm of uncertain behavior of skin: Secondary | ICD-10-CM | POA: Diagnosis not present

## 2022-05-27 DIAGNOSIS — L853 Xerosis cutis: Secondary | ICD-10-CM | POA: Diagnosis not present

## 2022-05-27 DIAGNOSIS — L570 Actinic keratosis: Secondary | ICD-10-CM | POA: Diagnosis not present

## 2022-05-27 DIAGNOSIS — D0462 Carcinoma in situ of skin of left upper limb, including shoulder: Secondary | ICD-10-CM | POA: Diagnosis not present

## 2022-05-27 DIAGNOSIS — Z85828 Personal history of other malignant neoplasm of skin: Secondary | ICD-10-CM | POA: Diagnosis not present

## 2022-05-27 DIAGNOSIS — L821 Other seborrheic keratosis: Secondary | ICD-10-CM | POA: Diagnosis not present

## 2022-05-27 DIAGNOSIS — L720 Epidermal cyst: Secondary | ICD-10-CM | POA: Diagnosis not present

## 2022-06-20 ENCOUNTER — Ambulatory Visit: Payer: Self-pay | Admitting: Obstetrics and Gynecology

## 2022-07-19 ENCOUNTER — Other Ambulatory Visit: Payer: Self-pay | Admitting: Internal Medicine

## 2022-07-22 ENCOUNTER — Other Ambulatory Visit: Payer: Self-pay | Admitting: Internal Medicine

## 2022-07-27 ENCOUNTER — Encounter: Payer: Self-pay | Admitting: Internal Medicine

## 2022-07-27 NOTE — Progress Notes (Unsigned)
Subjective:    Patient ID: Paige [name], female    DOB: [birth year], 71 y.o.   MRN: [id number]      HPI Paige [name] is here for a Physical exam.    Having difficulty getting words out - knows what she wants to say - can not think of the word.  This started months ago. It does not correspond to any new meds.  She feels this is getting worse.      Medications and allergies reviewed with patient and updated if appropriate.  Current Outpatient Medications on File Prior to Visit  Medication Sig Dispense Refill   bisoprolol-hydrochlorothiazide (ZIAC) 5-6.25 MG tablet Take 1 tablet by mouth once daily 90 tablet 0   escitalopram (LEXAPRO) 20 MG tablet Take 1 tablet (20 mg total) by mouth daily. 30 tablet 7   Eszopiclone 3 MG TABS Take 1 tablet (3 mg total) by mouth at bedtime as needed. Take immediately before bedtime 30 tablet 5   levothyroxine (SYNTHROID) 112 MCG tablet Take 1 tablet by mouth once daily 90 tablet 0   pravastatin (PRAVACHOL) 20 MG tablet Take 1 tablet (20 mg total) by mouth daily. 90 tablet 1   No current facility-administered medications on file prior to visit.    Review of Systems  Constitutional:  Negative for fever.  Eyes:  Negative for visual disturbance.  Respiratory:  Negative for cough, shortness of breath and wheezing.   Cardiovascular:  Negative for chest pain, palpitations and leg swelling.  Gastrointestinal:  Positive for abdominal pain (at times with constipation) and constipation. Negative for blood in stool and diarrhea.       No gerd  Genitourinary:  Negative for dysuria.  Musculoskeletal:  Positive for back pain. Negative for arthralgias.  Skin:  Negative for rash.  Neurological:  Positive for speech difficulty, light-headedness (occ) and headaches (sometimes).  Psychiatric/Behavioral:  Negative for dysphoric mood. The patient is nervous/anxious.        Objective:   Vitals:   07/28/22 0930  BP: 126/76  Pulse: 63  Temp: 97.9 F (36.6 C)   SpO2: 93%   Filed Weights   07/28/22 0930  Weight: 213 lb (96.6 kg)   Body mass index is 38.96 kg/m.  BP Readings from Last 3 Encounters:  07/28/22 126/76  05/26/22 129/80  04/29/22 106/72    Wt Readings from Last 3 Encounters:  07/28/22 213 lb (96.6 kg)  04/29/22 209 lb (94.8 kg)  01/23/22 208 lb (94.3 kg)       Physical Exam Constitutional: She appears well-developed and well-nourished. No distress.  HENT:  Head: Normocephalic and atraumatic.  Right Ear: External ear normal. Normal ear canal and TM Left Ear: External ear normal.  Normal ear canal and TM Mouth/Throat: Oropharynx is clear and moist.  Eyes: Conjunctivae normal.  Neck: Neck supple. No tracheal deviation present. No thyromegaly present.  No carotid bruit  Cardiovascular: Normal rate, regular rhythm and normal heart sounds.   No murmur heard.  No edema. Pulmonary/Chest: Effort normal and breath sounds normal. No respiratory distress. She has no wheezes. She has no rales.  Breast: deferred   Abdominal: Soft. She exhibits no distension. There is no tenderness.  Lymphadenopathy: She has no cervical adenopathy.  Neurological: CN 2-12 , sensation intact all extremities, strength intact all extremities, difficulty expressing herself, especially thinking of the words she wants to say, sometimes answers inappropriately Skin: Skin is warm and dry. She is not diaphoretic.  Psychiatric: She has a normal mood and  affect. Her behavior is normal.     Lab Results  Component Value Date   WBC 8.5 07/24/2021   HGB 12.2 07/24/2021   HCT 36.8 07/24/2021   PLT 218.0 07/24/2021   GLUCOSE 92 01/23/2022   CHOL 205 (H) 01/23/2022   TRIG 227.0 (H) 01/23/2022   HDL 52.00 01/23/2022   LDLDIRECT 127.0 01/23/2022   LDLCALC 156 (H) 07/24/2021   ALT 10 01/23/2022   AST 21 01/23/2022   NA 140 01/23/2022   K 3.8 01/23/2022   CL 102 01/23/2022   CREATININE 0.63 01/23/2022   BUN 16 01/23/2022   CO2 32 01/23/2022   TSH 0.32  (L) 01/23/2022   HGBA1C 6.4 01/23/2022         Assessment & Plan:   Physical exam: Screening blood work  ordered Exercise  none Weight  encouraged weight loss Substance abuse  none   Reviewed recommended immunizations.  Had shingrix and flu at [place].     Health Maintenance  Topic Date Due   Zoster Vaccines- Shingrix (1 of 2) Never done   COVID-19 Vaccine (5 - 2023-24 season) 04/18/2022   Medicare Annual Wellness (AWV)  02/11/2023   MAMMOGRAM  03/18/2023   DEXA SCAN  09/04/2025   COLONOSCOPY (Pts 45-63yr Insurance coverage will need to be confirmed)  01/11/2027   DTaP/Tdap/Td (3 - Td or Tdap) 02/09/2029   Pneumonia Vaccine 72 Years old  Completed   INFLUENZA VACCINE  Completed   Hepatitis C Screening  Completed   HPV VACCINES  Aged Out          See Problem List for Assessment and Plan of chronic medical problems.

## 2022-07-27 NOTE — Patient Instructions (Addendum)
Blood work was ordered.   The lab is on the first floor.    Medications changes include :   none   An MRI of your brain was ordered - someone will call you to schedule this.    A referral was ordered for [facility].     Someone will call you to schedule an appointment.    Return in about 3 months (around 10/27/2022) for follow up.     Health Maintenance, Female Adopting a healthy lifestyle and getting preventive care are important in promoting health and wellness. Ask your health care provider about: The right schedule for you to have regular tests and exams. Things you can do on your own to prevent diseases and keep yourself healthy. What should I know about diet, weight, and exercise? Eat a healthy diet  Eat a diet that includes plenty of vegetables, fruits, low-fat dairy products, and lean protein. Do not eat a lot of foods that are high in solid fats, added sugars, or sodium. Maintain a healthy weight Body mass index (BMI) is used to identify weight problems. It estimates body fat based on height and weight. Your health care provider can help determine your BMI and help you achieve or maintain a healthy weight. Get regular exercise Get regular exercise. This is one of the most important things you can do for your health. Most adults should: Exercise for at least 150 minutes each week. The exercise should increase your heart rate and make you sweat (moderate-intensity exercise). Do strengthening exercises at least twice a week. This is in addition to the moderate-intensity exercise. Spend less time sitting. Even light physical activity can be beneficial. Watch cholesterol and blood lipids Have your blood tested for lipids and cholesterol at 72 years of age, then have this test every 5 years. Have your cholesterol levels checked more often if: Your lipid or cholesterol levels are high. You are older than 72 years of age. You are at high risk for heart  disease. What should I know about cancer screening? Depending on your health history and family history, you may need to have cancer screening at various ages. This may include screening for: Breast cancer. Cervical cancer. Colorectal cancer. Skin cancer. Lung cancer. What should I know about heart disease, diabetes, and high blood pressure? Blood pressure and heart disease High blood pressure causes heart disease and increases the risk of stroke. This is more likely to develop in people who have high blood pressure readings or are overweight. Have your blood pressure checked: Every 3-5 years if you are 9-110 years of age. Every year if you are 10 years old or older. Diabetes Have regular diabetes screenings. This checks your fasting blood sugar level. Have the screening done: Once every three years after age 15 if you are at a normal weight and have a low risk for diabetes. More often and at a younger age if you are overweight or have a high risk for diabetes. What should I know about preventing infection? Hepatitis B If you have a higher risk for hepatitis B, you should be screened for this virus. Talk with your health care provider to find out if you are at risk for hepatitis B infection. Hepatitis C Testing is recommended for: Everyone born from [birth year] through 1966. Anyone with known risk factors for hepatitis C. Sexually transmitted infections (STIs) Get screened for STIs, including gonorrhea and chlamydia, if: You are sexually active and are younger than 72 years of  age. Dennis Bast are older than 72 years of age and your health care provider tells you that you are at risk for this type of infection. Your sexual activity has changed since you were last screened, and you are at increased risk for chlamydia or gonorrhea. Ask your health care provider if you are at risk. Ask your health care provider about whether you are at high risk for HIV. Your health care provider may recommend a  prescription medicine to help prevent HIV infection. If you choose to take medicine to prevent HIV, you should first get tested for HIV. You should then be tested every 3 months for as long as you are taking the medicine. Pregnancy If you are about to stop having your period (premenopausal) and you may become pregnant, seek counseling before you get pregnant. Take 400 to 800 micrograms (mcg) of folic acid every day if you become pregnant. Ask for birth control (contraception) if you want to prevent pregnancy. Osteoporosis and menopause Osteoporosis is a disease in which the bones lose minerals and strength with aging. This can result in bone fractures. If you are 55 years old or older, or if you are at risk for osteoporosis and fractures, ask your health care provider if you should: Be screened for bone loss. Take a calcium or vitamin D supplement to lower your risk of fractures. Be given hormone replacement therapy (HRT) to treat symptoms of menopause. Follow these instructions at home: Alcohol use Do not drink alcohol if: Your health care provider tells you not to drink. You are pregnant, may be pregnant, or are planning to become pregnant. If you drink alcohol: Limit how much you have to: 0-1 drink a day. Know how much alcohol is in your drink. In the [place] one drink equals one 12 oz bottle of beer (355 mL), one 5 oz glass of wine (148 mL), or one 1 oz glass of hard liquor (44 mL). Lifestyle Do not use any products that contain nicotine or tobacco. These products include cigarettes, chewing tobacco, and vaping devices, such as e-cigarettes. If you need help quitting, ask your health care provider. Do not use street drugs. Do not share needles. Ask your health care provider for help if you need support or information about quitting drugs. General instructions Schedule regular health, dental, and eye exams. Stay current with your vaccines. Tell your health care provider if: You often  feel depressed. You have ever been abused or do not feel safe at home. Summary Adopting a healthy lifestyle and getting preventive care are important in promoting health and wellness. Follow your health care provider's instructions about healthy diet, exercising, and getting tested or screened for diseases. Follow your health care provider's instructions on monitoring your cholesterol and blood pressure. This information is not intended to replace advice given to you by your health care provider. Make sure you discuss any questions you have with your health care provider. Document Revised: 12/24/2020 Document Reviewed: 12/24/2020 Elsevier Patient Education  [place].

## 2022-07-28 ENCOUNTER — Ambulatory Visit (INDEPENDENT_AMBULATORY_CARE_PROVIDER_SITE_OTHER): Payer: PPO | Admitting: Internal Medicine

## 2022-07-28 VITALS — BP 126/76 | HR 63 | Temp 97.9°F | Ht 62.0 in | Wt 213.0 lb

## 2022-07-28 DIAGNOSIS — R7303 Prediabetes: Secondary | ICD-10-CM | POA: Diagnosis not present

## 2022-07-28 DIAGNOSIS — N3281 Overactive bladder: Secondary | ICD-10-CM

## 2022-07-28 DIAGNOSIS — E039 Hypothyroidism, unspecified: Secondary | ICD-10-CM

## 2022-07-28 DIAGNOSIS — Z0000 Encounter for general adult medical examination without abnormal findings: Secondary | ICD-10-CM | POA: Diagnosis not present

## 2022-07-28 DIAGNOSIS — F411 Generalized anxiety disorder: Secondary | ICD-10-CM

## 2022-07-28 DIAGNOSIS — E782 Mixed hyperlipidemia: Secondary | ICD-10-CM | POA: Diagnosis not present

## 2022-07-28 DIAGNOSIS — I10 Essential (primary) hypertension: Secondary | ICD-10-CM | POA: Diagnosis not present

## 2022-07-28 DIAGNOSIS — R4701 Aphasia: Secondary | ICD-10-CM | POA: Diagnosis not present

## 2022-07-28 LAB — COMPREHENSIVE METABOLIC PANEL
ALT: 9 U/L (ref 0–35)
AST: 20 U/L (ref 0–37)
Albumin: 4.4 g/dL (ref 3.5–5.2)
Alkaline Phosphatase: 64 U/L (ref 39–117)
BUN: 19 mg/dL (ref 6–23)
CO2: 29 mEq/L (ref 19–32)
Calcium: 10.4 mg/dL (ref 8.4–10.5)
Chloride: 100 mEq/L (ref 96–112)
Creatinine, Ser: 0.75 mg/dL (ref 0.40–1.20)
GFR: 79.54 mL/min (ref >60.00)
Glucose, Bld: 106 mg/dL — ABNORMAL HIGH (ref 70–99)
Potassium: 4 mEq/L (ref 3.5–5.1)
Sodium: 138 mEq/L (ref 135–145)
Total Bilirubin: 0.3 mg/dL (ref 0.2–1.2)
Total Protein: 7.5 g/dL (ref 6.0–8.3)

## 2022-07-28 LAB — CBC WITH DIFFERENTIAL/PLATELET
Basophils Absolute: 0 10*3/uL (ref 0.0–0.1)
Basophils Relative: 0.6 % (ref 0.0–3.0)
Eosinophils Absolute: 0.3 10*3/uL (ref 0.0–0.7)
Eosinophils Relative: 4 % (ref 0.0–5.0)
HCT: 38.4 % (ref 36.0–46.0)
Hemoglobin: 12.7 g/dL (ref 12.0–15.0)
Lymphocytes Relative: 29.6 % (ref 12.0–46.0)
Lymphs Abs: 2 10*3/uL (ref 0.7–4.0)
MCHC: 33 g/dL (ref 30.0–36.0)
MCV: 81.9 fl (ref 78.0–100.0)
Monocytes Absolute: 0.5 10*3/uL (ref 0.1–1.0)
Monocytes Relative: 7 % (ref 3.0–12.0)
Neutro Abs: 4 10*3/uL (ref 1.4–7.7)
Neutrophils Relative %: 58.8 % (ref 43.0–77.0)
Platelets: 190 10*3/uL (ref 150.0–400.0)
RBC: 4.69 Mil/uL (ref 3.87–5.11)
RDW: 15.3 % (ref 11.5–15.5)
WBC: 6.8 10*3/uL (ref 4.0–10.5)

## 2022-07-28 LAB — LIPID PANEL
Cholesterol: 214 mg/dL — ABNORMAL HIGH (ref 0–200)
HDL: 62.4 mg/dL (ref >39.00)
LDL Cholesterol: 114 mg/dL — ABNORMAL HIGH (ref 0–99)
NonHDL: 151.6
Total CHOL/HDL Ratio: 3
Triglycerides: 187 mg/dL — ABNORMAL HIGH (ref 0.0–149.0)
VLDL: 37.4 mg/dL (ref 0.0–40.0)

## 2022-07-28 LAB — HEMOGLOBIN A1C: Hgb A1c MFr Bld: 6.4 % (ref 4.6–6.5)

## 2022-07-28 LAB — TSH: TSH: 1.85 u[IU]/mL (ref 0.35–5.50)

## 2022-07-28 NOTE — Assessment & Plan Note (Signed)
Chronic  Clinically euthyroid Check tsh and will titrate med dose if needed Currently taking levothyroxine 112 mcg daily  

## 2022-07-28 NOTE — Assessment & Plan Note (Signed)
Chronic Blood pressure well controlled CMP Continue bisoprolol-HCTZ 5-6.25 mg daily

## 2022-07-28 NOTE — Assessment & Plan Note (Signed)
Chronic Check a1c Low sugar / carb diet Stressed regular exercise  

## 2022-07-28 NOTE — Assessment & Plan Note (Addendum)
Chronic Following with Dr. Verlee Rossetti not covered

## 2022-07-28 NOTE — Assessment & Plan Note (Signed)
Chronic Check lipid panel  Continue pravastatin 20 mg daily Regular exercise and healthy diet encouraged  

## 2022-07-28 NOTE — Assessment & Plan Note (Signed)
Subacute This is the first time I am seeing her for this-she states it started several months ago, but cannot tell me exactly when.  She did not 6 months ago Difficulty getting out what she is thinking and having difficulty coming up with the word No other neurological deficits Not related to any new medication-there has been no changes in the past few months MRI of brain Referral to neurology

## 2022-07-28 NOTE — Assessment & Plan Note (Signed)
Chronic Management per Dr. Casimiro Needle On Lexapro 20 mg daily

## 2022-07-29 ENCOUNTER — Encounter: Payer: Self-pay | Admitting: Neurology

## 2022-07-30 ENCOUNTER — Ambulatory Visit
Admission: RE | Admit: 2022-07-30 | Discharge: 2022-07-30 | Disposition: A | Discharge status: Home / Self Care | Payer: PPO | Source: Ambulatory Visit | Attending: Internal Medicine | Admitting: Internal Medicine

## 2022-07-30 DIAGNOSIS — R4701 Aphasia: Secondary | ICD-10-CM

## 2022-07-30 DIAGNOSIS — G319 Degenerative disease of nervous system, unspecified: Secondary | ICD-10-CM | POA: Diagnosis not present

## 2022-07-30 DIAGNOSIS — G9389 Other specified disorders of brain: Secondary | ICD-10-CM | POA: Diagnosis not present

## 2022-07-30 DIAGNOSIS — R9082 White matter disease, unspecified: Secondary | ICD-10-CM | POA: Diagnosis not present

## 2022-07-31 ENCOUNTER — Encounter: Payer: Self-pay | Admitting: Internal Medicine

## 2022-07-31 ENCOUNTER — Telehealth: Payer: Self-pay

## 2022-07-31 ENCOUNTER — Telehealth: Payer: Self-pay | Admitting: Internal Medicine

## 2022-07-31 DIAGNOSIS — D329 Benign neoplasm of meninges, unspecified: Secondary | ICD-10-CM

## 2022-07-31 DIAGNOSIS — R4701 Aphasia: Secondary | ICD-10-CM

## 2022-07-31 HISTORY — DX: Benign neoplasm of meninges, unspecified: D32.9

## 2022-07-31 NOTE — Telephone Encounter (Signed)
Spoke with patient and results given. 

## 2022-07-31 NOTE — Telephone Encounter (Signed)
Patient called back about message below:   Paige [name], [facility] 07/30/2022  1:24 PM EST Back to Top    Left message for patient today to return call to clinic to discuss lab results.   Binnie Rail, MD 07/30/2022  7:53 AM EST     Your blood counts, thyroid function, kidney function and liver tests are normal.  Your cholesterol is better.  Your A1c is 6.4%-sugars in prediabetic range, but very close to being in the diabetic range.   She requested a call back from the nurse

## 2022-07-31 NOTE — Telephone Encounter (Signed)
Paige [name], is her anyway we can try [facility] neurolog[facility] since [facility] neurology is not able to see her until May?

## 2022-08-01 NOTE — Telephone Encounter (Signed)
New referral ordered

## 2022-09-04 ENCOUNTER — Telehealth: Payer: Self-pay | Admitting: Internal Medicine

## 2022-09-04 NOTE — Telephone Encounter (Signed)
Given her pending neurological workup I think that is okay.  She can either delay jury duty for now on her own or I can do that, but at this point I cannot currently take her out of jury duty.  I do need her jurer number

## 2022-09-04 NOTE — Telephone Encounter (Signed)
Patient is scheduled for jury duty - 09/29/2021 - Patient feels that she is unable to be on jury duty due to her physical limitations.  She will need a note to get her out of this.  Patient will come to pick up the note when it is finished.  Please call patient at : [phone]

## 2022-09-06 NOTE — Telephone Encounter (Signed)
Letter printed.

## 2022-09-08 NOTE — Telephone Encounter (Signed)
Called patient and letter left up front for pick up.

## 2022-09-08 NOTE — Progress Notes (Signed)
NEUROLOGY CONSULTATION NOTE  Paige [name] MRN: [id number] DOB: [birth year]  Referring provider: Billey Gosling, MD Primary care provider: Billey Gosling, MD  Reason for consult:  aphasia  Assessment/Plan:   Speech disturbance - primary concern would be primary progressive aphasia.  However, she endorses other symptoms that are not consistent with that diagnosis.   Cerebral meningioma - incidental finding  1  Will need neuropsychological evaluation to try and establish a more definitive diagnosis/etiology 2  Would repeat MRI of brain in one year to follow up on meningioma 3  Further recommendations pending results.  Follow up after testing.     Subjective:  Paige [name] is a 72 year old right-handed female with ADD, HTN, HLD, anxiety and hypothyroidism who presents for aphasia.  History supplemented by referring provider's note.  Started about year ago.  Can't get words out Sometimes can't understand others Writing but not sure if correct  Sometimes cannot hear things - people knocking on door  Family  brother bipolar   TSH from 07/28/2022 was 1.85.  MRI of brain without contrast on 07/30/2022 personally reviewed revealed atrophy and mildly advanced chronic small vessel ischemic changes with a 14 mm calcified right frontal lobe meningioma.     PAST MEDICAL HISTORY: Past Medical History:  Diagnosis Date   ADD (attention deficit disorder)    Pt has hard time focusing   Anxiety    on meds   Arthritis    Carpal tunnel syndrome    Colon polyp    Constipation    Depression    Gallstones    GERD (gastroesophageal reflux disease)    on meds   Hyperlipidemia    on meds   Hypertension    on meds   Status post dilation of esophageal narrowing    Thyroid disease    on meds    PAST SURGICAL HISTORY: Past Surgical History:  Procedure Laterality Date   ABDOMINAL HYSTERECTOMY     CARPAL TUNNEL RELEASE Left    CHOLECYSTECTOMY     CLOSED REDUCTION HAND FRACTURE      right hand   COLONOSCOPY  2018   JP-MAC-suprep(exc)-int hems-5 yr recall-   COLONOSCOPY  2013   TA   fx leg     NASAL SINUS SURGERY     POLYPECTOMY  2013   TA   SHOULDER SURGERY     reattac tendon; right shoulder   TIBIA FRACTURE SURGERY     steel rod right leg   TUBAL LIGATION      MEDICATIONS: Current Outpatient Medications on File Prior to Visit  Medication Sig Dispense Refill   bisoprolol-hydrochlorothiazide (ZIAC) 5-6.25 MG tablet Take 1 tablet by mouth once daily 90 tablet 0   escitalopram (LEXAPRO) 20 MG tablet Take 1 tablet (20 mg total) by mouth daily. 30 tablet 7   Eszopiclone 3 MG TABS Take 1 tablet (3 mg total) by mouth at bedtime as needed. Take immediately before bedtime 30 tablet 5   levothyroxine (SYNTHROID) 112 MCG tablet Take 1 tablet by mouth once daily 90 tablet 0   pravastatin (PRAVACHOL) 20 MG tablet Take 1 tablet (20 mg total) by mouth daily. 90 tablet 1   No current facility-administered medications on file prior to visit.    ALLERGIES: Allergies  Allergen Reactions   Asa [Aspirin] Other (See Comments)    SOB and blocks ears and nose   Augmentin [Amoxicillin-Pot Clavulanate] Nausea And Vomiting   Crestor [Rosuvastatin]     Joint pain  Morphine And Related Nausea And Vomiting    FAMILY HISTORY: Family History  Problem Relation Age of Onset   Colon polyps Mother 49   Hyperlipidemia Mother    Colon cancer Mother 5   Lung cancer Father    Leukemia Maternal Aunt    Cancer Paternal Grandfather        mets   Esophageal cancer Neg Hx    Rectal cancer Neg Hx    Stomach cancer Neg Hx    Breast cancer Neg Hx     Objective:  Blood pressure (!) 126/55, pulse 61, height _0  (1.575 m), weight 217 lb 6.4 oz (98.6 kg), SpO2 96 %. General: No acute distress.  Patient appears well-groomed.   Head:  Normocephalic/atraumatic Eyes:  fundi examined but not visualized Neck: supple, no paraspinal tenderness, full range of motion Back: No paraspinal  tenderness Heart: regular rate and rhythm Lungs: Clear to auscultation bilaterally. Vascular: No carotid bruits. Neurological Exam: Mental status: alert and oriented to person, place, and time, speech fluent and not dysarthric, language intact.    09/09/2022    3:00 PM  [employer] Mental Exam  Weekday Correct 1  Current year 1  What state are we in? 1  Amount spent 0  Amount left 0  # of Animals 2  5 objects recall 1  Number series 1  Hour markers 0  Time correct 0  Placed X in triangle correctly 1  Largest Figure 1  Name of female 2  Date back to work 0  Type of work 2  State she lived in 0  Total score 13   Cranial nerves: CN I: not tested CN II: pupils equal, round and reactive to light, visual fields intact CN III, IV, VI:  full range of motion, no nystagmus, no ptosis CN V: facial sensation intact. CN VII: upper and lower face symmetric CN VIII: hearing intact CN IX, X: gag intact, uvula midline CN XI: sternocleidomastoid and trapezius muscles intact CN XII: tongue midline Bulk & Tone: normal, no fasciculations. Motor:  muscle strength 5/5 throughout Sensation:  temperature and vibratory sensation intact. Deep Tendon Reflexes:  2+ throughout,  toes downgoing.   Finger to nose testing:  Without dysmetria.    Gait:  Normal station and stride.  Romberg negative.    Thank you for allowing me to take part in the care of this patient.  Metta Clines, DO  CC: Billey Gosling, MD

## 2022-09-09 ENCOUNTER — Ambulatory Visit (INDEPENDENT_AMBULATORY_CARE_PROVIDER_SITE_OTHER): Payer: PPO | Admitting: Neurology

## 2022-09-09 ENCOUNTER — Encounter: Payer: Self-pay | Admitting: Neurology

## 2022-09-09 VITALS — BP 126/55 | HR 61 | Ht 62.0 in | Wt 217.4 lb

## 2022-09-09 DIAGNOSIS — D320 Benign neoplasm of cerebral meninges: Secondary | ICD-10-CM

## 2022-09-09 DIAGNOSIS — R479 Unspecified speech disturbances: Secondary | ICD-10-CM | POA: Diagnosis not present

## 2022-09-09 NOTE — Patient Instructions (Addendum)
Check neuropsychological evaluation Would recommend repeating MRI of brain in one year to follow up on meningioma Follow up after testing

## 2022-09-17 ENCOUNTER — Ambulatory Visit: Payer: Self-pay | Admitting: Diagnostic Neuroimaging

## 2022-09-18 DIAGNOSIS — R440 Auditory hallucinations: Secondary | ICD-10-CM | POA: Insufficient documentation

## 2022-09-18 DIAGNOSIS — R441 Visual hallucinations: Secondary | ICD-10-CM | POA: Insufficient documentation

## 2022-09-18 HISTORY — DX: Visual hallucinations: R44.1

## 2022-09-18 HISTORY — DX: Auditory hallucinations: R44.0

## 2022-10-07 ENCOUNTER — Ambulatory Visit (HOSPITAL_COMMUNITY): Payer: PPO | Admitting: Psychiatry

## 2022-10-07 DIAGNOSIS — F323 Major depressive disorder, single episode, severe with psychotic features: Secondary | ICD-10-CM | POA: Diagnosis not present

## 2022-10-07 MED ORDER — ARIPIPRAZOLE 5 MG PO TABS: ORAL_TABLET | ORAL | 4 refills | Status: DC

## 2022-10-07 NOTE — Progress Notes (Signed)
Patient ID: Paige [name], female   DOB: Dec 14, 1950, 72 y.o.   MRN: [id number] Patient ID: Paige [name], female   DOB: 02-13-1951, 72 y.o.   MRN: [id number] [facility] MD Progress Note  10/07/2022 1:58 PM Paige Manges Swor   Pa time.st Medical History:   MRN:  [id number] Diagnosis major depressis  Today the patient is distinctly different than her baseline.  While her affect seems to be is jolly it is silly as usual the patient shares that she feels depressed.  She shares that she does not feel her usual she feels uncomfortable and unnatural.  This difficulty interpretive her sleep is any different but her appetite is reduced.  She is reduced a small amount of her weight.  Her energy level is lower than usual and she clearly is having more problems articulating herself and concentrating.  She clearly acknowledges that she feels worthless.  Most importantly she describes auditory hallucinations for the last month or so.  They are a neighbor that she does not like and she hears the neighbor talking to someone else.  Her children and grandchildren tell her there is no way that she could be hearing the voices as they do not.  This is clearly distinctly different.  It is noted that she has recently been diagnosed with a meningioma that apparently seems to be small.  She is seeing a neurologist.  The patient has had a long history of clinical depression but she has actually never been psychotic.  She has been taking Lexapro and has been fairly stable.  She takes Lunesta for sleep that seems to be working well.  The patient denies visual hallucinations.  She distinctly denies being suicidal.  At this time the patient i Past Medical History:  Diagnosis Date   ADD (attention deficit disorder)    Pt has hard time focusing   Anxiety    on meds   Arthritis    Carpal tunnel syndrome    Colon polyp    Constipation    Depression    Gallstones    GERD (gastroesophageal reflux disease)    on meds    Hyperlipidemia    on meds   Hypertension    on meds   Status post dilation of esophageal narrowing    Thyroid disease    on meds    Past Surgical History:  Procedure Laterality Date   ABDOMINAL HYSTERECTOMY     CARPAL TUNNEL RELEASE Bilateral    CHOLECYSTECTOMY     CLOSED REDUCTION HAND FRACTURE     right hand   COLONOSCOPY  2018   JP-MAC-suprep(exc)-int hems-5 yr recall-   COLONOSCOPY  2013   TA   fx leg     NASAL SINUS SURGERY     POLYPECTOMY  2013   TA   SHOULDER SURGERY     reattac tendon; right shoulder   TIBIA FRACTURE SURGERY     steel rod right leg   TUBAL LIGATION     Family History:  Family History  Problem Relation Age of Onset   Colon polyps Mother 58   Hyperlipidemia Mother    Colon cancer Mother 52   Lung cancer Father    Leukemia Maternal Aunt    Cancer Paternal Grandfather        mets   Esophageal cancer Neg Hx    Rectal cancer Neg Hx    Stomach cancer Neg Hx    Breast cancer Neg Hx    Family Psychiatric  History:  Social History:  Social History   Substance and Sexual Activity  Alcohol Use No     Social History   Substance and Sexual Activity  Drug Use No    Social History   Socioeconomic History   Marital status: Widowed    Spouse name: Not on file   Number of children: 2   Years of education: Not on file   Highest education level: High school graduate  Occupational History   Occupation: retired    Fish farm manager: UNEMPLOYED   Occupation: Conservation officer, historic buildings: [place]  Tobacco Use   Smoking status: Never   Smokeless tobacco: Never  Vaping Use   Vaping Use: Never used  Substance and Sexual Activity   Alcohol use: No   Drug use: No   Sexual activity: Not Currently  Other Topics Concern   Not on file  Social History Narrative   Works at [employer] - husband died of lung cancer      No regular exercise.   Are you right handed or left handed?    Are you currently employed ? Yes     What is your current occupation? at [employer]      Do you live at home alone? YEs   Who lives with you?    What type of home do you live in: 1 story or 2 story? 1       Social Determinants of Health   Financial Resource Strain: Low Risk  (02/10/2022)   Overall Financial Resource Strain (CARDIA)    Difficulty of Paying Living Expenses: Not very hard  Food Insecurity: No Food Insecurity (02/10/2022)   Hunger Vital Sign    Worried About Running Out of Food in the Last Year: Never true    Ran Out of Food in the Last Year: Never true  Transportation Needs: No Transportation Needs (02/10/2022)   PRAPARE - Hydrologist (Medical): No    Lack of Transportation (Non-Medical): No  Physical Activity: Insufficiently Active (02/10/2022)   Exercise Vital Sign    Days of Exercise per Week: 2 days    Minutes of Exercise per Session: 30 min  Stress: No Stress Concern Present (02/10/2022)   [place]    Feeling of Stress : Only a little  Social Connections: Socially Isolated (02/10/2022)   Social Connection and Isolation Panel [NHANES]    Frequency of Communication with Friends and Family: Twice a week    Frequency of Social Gatherings with Friends and Family: Twice a week    Attends Religious Services: Never    Marine scientist or Organizations: No    Attends Archivist Meetings: Never    Marital Status: Widowed   Additional Social History:                         Sleep: Fair  Appetite:  Good  Current Medications: Current Outpatient Medications  Medication Sig Dispense Refill   ARIPiprazole (ABILIFY) 5 MG tablet 1  qhs 30 tablet 4   bisoprolol-hydrochlorothiazide (ZIAC) 5-6.25 MG tablet Take 1 tablet by mouth once daily 90 tablet 0   escitalopram (LEXAPRO) 20 MG tablet Take 1 tablet (20 mg total) by mouth daily. 30 tablet 7   Eszopiclone 3 MG TABS Take 1  tablet (3 mg total) by mouth at bedtime as needed. Take immediately  before bedtime 30 tablet 5   levothyroxine (SYNTHROID) 112 MCG tablet Take 1 tablet by mouth once daily 90 tablet 0   pravastatin (PRAVACHOL) 20 MG tablet Take 1 tablet (20 mg total) by mouth daily. 90 tablet 1   No current facility-administered medications for this visit.    Lab Results: No results found for this or any previous visit (from the past 48 hour(s)).  Blood Alcohol level:  No results found for: "ETH"  Physical Findings: AIMS:  , ,  ,  ,    CIWA:    COWS:     Musculoskeletal: Strength & Muscle Tone: within normal limits Gait & Station: normal Patient leans: N/A  Psychiatric Specialty Exam: ROS  There were no vitals taken for this visit.There is no height or weight on file to calculate BMI.  General Appearance: Fairly Groomed  Engineer, water::  Good  Speech:  Clear and Coherent  Volume:  Normal  Mood:  Euthymic  Affect:  Blunt  Thought Process:  Coherent  Orientation:  Full (Time, Place, and Person)  Thought Content:  WDL  Suicidal Thoughts:  No  Homicidal Thoughts:  No  Memory:  NA  Judgement:  NA  Insight:  Good  Psychomotor Activity:  Decreased  Concentration:  Fair  Recall:  [employer] of Knowledge:Good  Language: Fair  Akathisia:  No  Handed:  Right  AIMS (if indicated):     Assets:  Desire for Improvement  ADL's:  Intact  Cognition: WNL  Sleep:      Treatment Plan Summary: 10/07/2022, 1:58 PM   At this time this patient's diagnosis is major depression with psychosis.  She will continue taking Lexapro 20 mg.  She will continue taking Lunesta for sleep.  Today we will add Abilify 5 mg and asked her to return in 1 month with her adult children.  The patient continues to function okay.  She drove here on her own.

## 2022-10-18 ENCOUNTER — Other Ambulatory Visit: Payer: Self-pay | Admitting: Internal Medicine

## 2022-10-24 ENCOUNTER — Telehealth: Payer: Self-pay | Admitting: Internal Medicine

## 2022-10-24 NOTE — Telephone Encounter (Signed)
error 

## 2022-10-26 ENCOUNTER — Encounter: Payer: Self-pay | Admitting: Internal Medicine

## 2022-10-26 NOTE — Patient Instructions (Addendum)
      Blood work was ordered.   The lab is on the first floor.    Medications changes include :       A referral was ordered for XXX.     Someone will call you to schedule an appointment.    Return in about 6 months (around 04/29/2023) for Physical Exam.

## 2022-10-26 NOTE — Progress Notes (Signed)
Subjective:    Patient ID: Paige [name], female    DOB: 01/20/1951, 73 y.o.   MRN: [id number]     HPI Paige [name] is here for follow up of her chronic medical problems. She is here today with her grandson.   She did see Dr. Tomi Likens for her aphasia.  He feels she may have primary progressive aphasia, but also has other symptoms not consistent with that-she was referred for neuropsychological evaluation-to be done in June.   Having hallucinations - hearing people and seeing people.  Per her grandson he thinks this started about the first week in February.  When she saw Dr. Casimiro Needle the end of February she was having auditory hallucinations, but not visual hallucinations.  She hears her neighbors talking to her in her house from their house  - she feels they have something that they can see her - they tell her to turn off her light - they can't go to bed because it it too bright.  One day she heard the neighbor tell her to come to the house.  She sometimes sees people in the front yard singing.  She has a few different people that she sees-a man, known to woman, another woman who is a friend of hers.  Some of them can be mean.   She thought they were following her up here today.    She is not taking her abilify because it made the voices louder.  She has a follow-up with Dr. Casimiro Needle on 3/19 and she will bring family.  Medications and allergies reviewed with patient and updated if appropriate.  Current Outpatient Medications on File Prior to Visit  Medication Sig Dispense Refill   bisoprolol-hydrochlorothiazide (ZIAC) 5-6.25 MG tablet Take 1 tablet by mouth once daily 90 tablet 0   escitalopram (LEXAPRO) 20 MG tablet Take 1 tablet (20 mg total) by mouth daily. 30 tablet 7   Eszopiclone 3 MG TABS Take 1 tablet (3 mg total) by mouth at bedtime as needed. Take immediately before bedtime 30 tablet 5   levothyroxine (SYNTHROID) 112 MCG tablet Take 1 tablet by mouth once daily 90 tablet 0    pravastatin (PRAVACHOL) 20 MG tablet Take 1 tablet by mouth once daily 90 tablet 0   ARIPiprazole (ABILIFY) 5 MG tablet 1  qhs (Patient not taking: Reported on 10/27/2022) 30 tablet 4   No current facility-administered medications on file prior to visit.     Review of Systems  Constitutional:  Negative for appetite change and fever.  HENT:  Positive for congestion.   Respiratory:  Negative for cough, shortness of breath and wheezing.   Cardiovascular:  Positive for palpitations (anxiety related). Negative for chest pain and leg swelling.  Neurological:  Positive for headaches. Negative for light-headedness.       Objective:   Vitals:   10/27/22 0952  BP: (!) 118/58  Pulse: 60  Temp: 98 F (36.7 C)  SpO2: 98%   BP Readings from Last 3 Encounters:  10/27/22 (!) 118/58  09/09/22 (!) 126/55  07/28/22 126/76   Wt Readings from Last 3 Encounters:  10/27/22 207 lb (93.9 kg)  09/09/22 217 lb 6.4 oz (98.6 kg)  07/28/22 213 lb (96.6 kg)   Body mass index is 37.86 kg/m.    Physical Exam Constitutional:      General: She is not in acute distress.    Appearance: Normal appearance.  HENT:     Head: Normocephalic and atraumatic.  Eyes:  Conjunctiva/sclera: Conjunctivae normal.  Cardiovascular:     Rate and Rhythm: Normal rate and regular rhythm.     Heart sounds: Normal heart sounds.  Pulmonary:     Effort: Pulmonary effort is normal. No respiratory distress.     Breath sounds: Normal breath sounds. No wheezing.  Musculoskeletal:     Cervical back: Neck supple.     Right lower leg: No edema.     Left lower leg: No edema.  Lymphadenopathy:     Cervical: No cervical adenopathy.  Skin:    General: Skin is warm and dry.     Findings: No rash.  Neurological:     Mental Status: She is alert. Mental status is at baseline.  Psychiatric:        Mood and Affect: Mood normal.        Behavior: Behavior normal.        Lab Results  Component Value Date   WBC 6.8  07/28/2022   HGB 12.7 07/28/2022   HCT 38.4 07/28/2022   PLT 190.0 07/28/2022   GLUCOSE 106 (H) 07/28/2022   CHOL 214 (H) 07/28/2022   TRIG 187.0 (H) 07/28/2022   HDL 62.40 07/28/2022   LDLDIRECT 127.0 01/23/2022   LDLCALC 114 (H) 07/28/2022   ALT 9 07/28/2022   AST 20 07/28/2022   NA 138 07/28/2022   K 4.0 07/28/2022   CL 100 07/28/2022   CREATININE 0.75 07/28/2022   BUN 19 07/28/2022   CO2 29 07/28/2022   TSH 1.85 07/28/2022   HGBA1C 6.4 07/28/2022     Assessment & Plan:    See Problem List for Assessment and Plan of chronic medical problems.

## 2022-10-27 ENCOUNTER — Ambulatory Visit (INDEPENDENT_AMBULATORY_CARE_PROVIDER_SITE_OTHER): Payer: PPO | Admitting: Internal Medicine

## 2022-10-27 VITALS — BP 118/58 | HR 60 | Temp 98.0°F | Ht 62.0 in | Wt 207.0 lb

## 2022-10-27 DIAGNOSIS — Z1231 Encounter for screening mammogram for malignant neoplasm of breast: Secondary | ICD-10-CM

## 2022-10-27 DIAGNOSIS — R7303 Prediabetes: Secondary | ICD-10-CM

## 2022-10-27 DIAGNOSIS — R4701 Aphasia: Secondary | ICD-10-CM | POA: Diagnosis not present

## 2022-10-27 DIAGNOSIS — E039 Hypothyroidism, unspecified: Secondary | ICD-10-CM

## 2022-10-27 DIAGNOSIS — I10 Essential (primary) hypertension: Secondary | ICD-10-CM

## 2022-10-27 DIAGNOSIS — E782 Mixed hyperlipidemia: Secondary | ICD-10-CM

## 2022-10-27 LAB — CBC WITH DIFFERENTIAL/PLATELET
Basophils Absolute: 0.1 10*3/uL (ref 0.0–0.1)
Basophils Relative: 0.8 % (ref 0.0–3.0)
Eosinophils Absolute: 0.1 10*3/uL (ref 0.0–0.7)
Eosinophils Relative: 2.1 % (ref 0.0–5.0)
HCT: 40.4 % (ref 36.0–46.0)
Hemoglobin: 13.4 g/dL (ref 12.0–15.0)
Lymphocytes Relative: 27.4 % (ref 12.0–46.0)
Lymphs Abs: 1.9 10*3/uL (ref 0.7–4.0)
MCHC: 33.2 g/dL (ref 30.0–36.0)
MCV: 82.1 fl (ref 78.0–100.0)
Monocytes Absolute: 0.4 10*3/uL (ref 0.1–1.0)
Monocytes Relative: 5.6 % (ref 3.0–12.0)
Neutro Abs: 4.4 10*3/uL (ref 1.4–7.7)
Neutrophils Relative %: 64.1 % (ref 43.0–77.0)
Platelets: 240 10*3/uL (ref 150.0–400.0)
RBC: 4.92 Mil/uL (ref 3.87–5.11)
RDW: 14.5 % (ref 11.5–15.5)
WBC: 6.9 10*3/uL (ref 4.0–10.5)

## 2022-10-27 LAB — HEMOGLOBIN A1C: Hgb A1c MFr Bld: 6.6 % — ABNORMAL HIGH (ref 4.6–6.5)

## 2022-10-27 LAB — COMPREHENSIVE METABOLIC PANEL
ALT: 12 U/L (ref 0–35)
AST: 27 U/L (ref 0–37)
Albumin: 4.4 g/dL (ref 3.5–5.2)
Alkaline Phosphatase: 81 U/L (ref 39–117)
BUN: 21 mg/dL (ref 6–23)
CO2: 30 mEq/L (ref 19–32)
Calcium: 10.8 mg/dL — ABNORMAL HIGH (ref 8.4–10.5)
Chloride: 99 mEq/L (ref 96–112)
Creatinine, Ser: 0.75 mg/dL (ref 0.40–1.20)
GFR: 79.4 mL/min (ref >60.00)
Glucose, Bld: 116 mg/dL — ABNORMAL HIGH (ref 70–99)
Potassium: 3.6 mEq/L (ref 3.5–5.1)
Sodium: 138 mEq/L (ref 135–145)
Total Bilirubin: 0.4 mg/dL (ref 0.2–1.2)
Total Protein: 7.9 g/dL (ref 6.0–8.3)

## 2022-10-27 LAB — LIPID PANEL
Cholesterol: 183 mg/dL (ref 0–200)
HDL: 60.7 mg/dL (ref >39.00)
LDL Cholesterol: 88 mg/dL (ref 0–99)
NonHDL: 122.38
Total CHOL/HDL Ratio: 3
Triglycerides: 174 mg/dL — ABNORMAL HIGH (ref 0.0–149.0)
VLDL: 34.8 mg/dL (ref 0.0–40.0)

## 2022-10-27 LAB — TSH: TSH: 2.14 u[IU]/mL (ref 0.35–5.50)

## 2022-10-27 NOTE — Assessment & Plan Note (Addendum)
Chronic Blood pressure well controlled CMP, CBC Continue bisoprolol-HCTZ 5-6.25 mg daily

## 2022-10-27 NOTE — Assessment & Plan Note (Signed)
Subacute-started months ago More recently started having auditory and visual hallucinations some paranoia Has seen neurology Scheduled for neuropsychological evaluation in June 2024 Concern for dementia

## 2022-10-27 NOTE — Assessment & Plan Note (Signed)
Chronic  Clinically euthyroid Check tsh and will titrate med dose if needed Currently taking levothyroxine 112 mcg daily 

## 2022-10-27 NOTE — Assessment & Plan Note (Signed)
Chronic Check a1c Low sugar / carb diet Stressed regular exercise  

## 2022-10-27 NOTE — Assessment & Plan Note (Signed)
Chronic Regular exercise and healthy diet encouraged Check lipid panel, CMP Continue pravastatin 20 mg daily

## 2022-11-04 ENCOUNTER — Ambulatory Visit (HOSPITAL_BASED_OUTPATIENT_CLINIC_OR_DEPARTMENT_OTHER): Payer: PPO | Admitting: Psychiatry

## 2022-11-04 ENCOUNTER — Other Ambulatory Visit: Payer: Self-pay | Admitting: Neurology

## 2022-11-04 ENCOUNTER — Telehealth: Payer: Self-pay | Admitting: Neurology

## 2022-11-04 ENCOUNTER — Encounter (HOSPITAL_COMMUNITY): Payer: Self-pay | Admitting: Psychiatry

## 2022-11-04 VITALS — BP 122/76 | HR 109 | Ht 62.0 in | Wt 205.0 lb

## 2022-11-04 DIAGNOSIS — R4689 Other symptoms and signs involving appearance and behavior: Secondary | ICD-10-CM

## 2022-11-04 DIAGNOSIS — F323 Major depressive disorder, single episode, severe with psychotic features: Secondary | ICD-10-CM

## 2022-11-04 MED ORDER — LURASIDONE HCL 20 MG PO TABS: 20.0000 mg | ORAL_TABLET | Freq: Every day | ORAL | 3 refills | Status: DC

## 2022-11-04 MED ORDER — ESZOPICLONE 3 MG PO TABS: 3.0000 mg | ORAL_TABLET | Freq: Every evening | ORAL | 5 refills | Status: DC | PRN

## 2022-11-04 NOTE — Progress Notes (Signed)
Patient ID: Paige [name], female   DOB: [birth year], 72 y.o.   MRN: [id number] Patient ID: Paige [name], female   DOB: 21-Sep-1950, 72 y.o.   MRN: [id number] [facility] MD Progress Note  11/04/2022 3:18 PM Paige Manges [place]   Pa time.st Medical History:   MRN:  [id number] Diagnosis major depressis  Today the patient is seen with her 2 sons and her grandson.  These are 3 adults are very concerned about their mother with the patient.  In the last few months the patient has been experiencing increasing auditory hallucinations.  She seems paranoid.  She is worried that somebody is going to try to take her home away and put her away.  She is particularly worried about the next-door neighbors who she thinks are threatening to her.  The patient clearly can say that she hears voices talking and no one else seems to.  She does not have any visual hallucinations.  She has periods where she cries according to family but generally she is very jovial.  The patient continues to drive without a problem.  She handles all her own medications and does all her bills.  Her appetite is slightly reduced.  Her energy level is fair.  She is diagnosed with meningioma and was told that they need to repeat MRI for her in a few months.  Family is very concerned with what is causing this.  This patient diagnosed 3 years has had mild clinical depression but never this severe in terms of psychosis.  She is not suicidal.  Her psychosis seems to be fairly intense.  It also she says is associated with increasing headache.  She does not have any specific neurological focal findings.  She has no weakness or sensory changes by her report.  Her vision is stable.  She took the Abilify 5 mg that was prescribed on her last visit but got very agitated with it.  She took a few doses and stopped it. At this time the patient i Past Medical History:  Diagnosis Date   ADD (attention deficit disorder)    Pt has hard time focusing   Anxiety    on  meds   Arthritis    Carpal tunnel syndrome    Colon polyp    Constipation    Depression    Gallstones    GERD (gastroesophageal reflux disease)    on meds   Hyperlipidemia    on meds   Hypertension    on meds   Status post dilation of esophageal narrowing    Thyroid disease    on meds    Past Surgical History:  Procedure Laterality Date   ABDOMINAL HYSTERECTOMY     CARPAL TUNNEL RELEASE Bilateral    CHOLECYSTECTOMY     CLOSED REDUCTION HAND FRACTURE     right hand   COLONOSCOPY  2018   JP-MAC-suprep(exc)-int hems-5 yr recall-   COLONOSCOPY  2013   TA   fx leg     NASAL SINUS SURGERY     POLYPECTOMY  2013   TA   SHOULDER SURGERY     reattac tendon; right shoulder   TIBIA FRACTURE SURGERY     steel rod right leg   TUBAL LIGATION     Family History:  Family History  Problem Relation Age of Onset   Colon polyps Mother 32   Hyperlipidemia Mother    Colon cancer Mother 32   Lung cancer Father    Leukemia Maternal Aunt  Cancer Paternal Grandfather        mets   Esophageal cancer Neg Hx    Rectal cancer Neg Hx    Stomach cancer Neg Hx    Breast cancer Neg Hx    Family Psychiatric  History:  Social History:  Social History   Substance and Sexual Activity  Alcohol Use No     Social History   Substance and Sexual Activity  Drug Use No    Social History   Socioeconomic History   Marital status: Widowed    Spouse name: Not on file   Number of children: 2   Years of education: Not on file   Highest education level: High school graduate  Occupational History   Occupation: retired    Fish farm manager: UNEMPLOYED   Occupation: Conservation officer, historic buildings: [place]  Tobacco Use   Smoking status: Never   Smokeless tobacco: Never  Vaping Use   Vaping Use: Never used  Substance and Sexual Activity   Alcohol use: No   Drug use: No   Sexual activity: Not Currently  Other Topics Concern   Not on file  Social History Narrative   Works at [employer] - husband died of lung cancer      No regular exercise.   Are you right handed or left handed?    Are you currently employed ? Yes    What is your current occupation? at [employer]      Do you live at home alone? YEs   Who lives with you?    What type of home do you live in: 1 story or 2 story? 1       Social Determinants of Health   Financial Resource Strain: Low Risk  (02/10/2022)   Overall Financial Resource Strain (CARDIA)    Difficulty of Paying Living Expenses: Not very hard  Food Insecurity: No Food Insecurity (02/10/2022)   Hunger Vital Sign    Worried About Running Out of Food in the Last Year: Never true    Ran Out of Food in the Last Year: Never true  Transportation Needs: No Transportation Needs (02/10/2022)   PRAPARE - Hydrologist (Medical): No    Lack of Transportation (Non-Medical): No  Physical Activity: Insufficiently Active (02/10/2022)   Exercise Vital Sign    Days of Exercise per Week: 2 days    Minutes of Exercise per Session: 30 min  Stress: No Stress Concern Present (02/10/2022)   [place]    Feeling of Stress : Only a little  Social Connections: Socially Isolated (02/10/2022)   Social Connection and Isolation Panel [NHANES]    Frequency of Communication with Friends and Family: Twice a week    Frequency of Social Gatherings with Friends and Family: Twice a week    Attends Religious Services: Never    Marine scientist or Organizations: No    Attends Archivist Meetings: Never    Marital Status: Widowed   Additional Social History:                         Sleep: Fair  Appetite:  Good  Current Medications: Current Outpatient Medications  Medication Sig Dispense Refill   bisoprolol-hydrochlorothiazide (ZIAC) 5-6.25 MG tablet Take 1 tablet by mouth once daily 90 tablet 0   escitalopram  (LEXAPRO) 20 MG  tablet Take 1 tablet (20 mg total) by mouth daily. 30 tablet 7   levothyroxine (SYNTHROID) 112 MCG tablet Take 1 tablet by mouth once daily 90 tablet 0   lurasidone (LATUDA) 20 MG TABS tablet Take 1 tablet (20 mg total) by mouth at bedtime. 30 tablet 3   pravastatin (PRAVACHOL) 20 MG tablet Take 1 tablet by mouth once daily 90 tablet 0   Eszopiclone 3 MG TABS Take 1 tablet (3 mg total) by mouth at bedtime as needed. Take immediately before bedtime 30 tablet 5   No current facility-administered medications for this visit.    Lab Results: No results found for this or any previous visit (from the past 48 hour(s)).  Blood Alcohol level:  No results found for: "ETH"  Physical Findings: AIMS:  , ,  ,  ,    CIWA:    COWS:     Musculoskeletal: Strength & Muscle Tone: within normal limits Gait & Station: normal Patient leans: N/A  Psychiatric Specialty Exam: ROS  Blood pressure 122/76, pulse (!) 109, height 5\' 2"  (1.575 m), weight 205 lb (93 kg).Body mass index is 37.49 kg/m.  General Appearance: Fairly Groomed  Engineer, water::  Good  Speech:  Clear and Coherent  Volume:  Normal  Mood:  Euthymic  Affect:  Blunt  Thought Process:  Coherent  Orientation:  Full (Time, Place, and Person)  Thought Content:  WDL  Suicidal Thoughts:  No  Homicidal Thoughts:  No  Memory:  NA  Judgement:  NA  Insight:  Good  Psychomotor Activity:  Decreased  Concentration:  Fair  Recall:  [employer] of Knowledge:Good  Language: Fair  Akathisia:  No  Handed:  Right  AIMS (if indicated):     Assets:  Desire for Improvement  ADL's:  Intact  Cognition: WNL  Sleep:      Treatment Plan Summary: 11/04/2022, 3:18 PM  This patient has a history of major depression.  She will continue taking 20 mg of Lexapro.  She has problems sleeping as well but Lunesta seems to help.  The most overt symptoms she has is auditory hallucinations.  They are very distressing to her.  We discontinued her  Abilify and will begin her on Latuda 20 mg at the end of the day.  Patient will return to see me in 3 to 4 weeks.  I will make an attempt to contact her neurologist to discuss and collaborate over this patient.

## 2022-11-04 NOTE — Telephone Encounter (Signed)
I have spoken with patient's psychiatrist, Dr. Casimiro Needle.  Since I last saw patient in January, she has become psychotic, specifically auditory hallucinations.  Acute change in behavior.  I would like to check a UA with culture.  If negative, would proceed with MRI of brain WITH and without contrast as well as routine EEG.  Check for UTI first.

## 2022-11-05 NOTE — Addendum Note (Signed)
Addended by: Venetia Night on: 11/05/2022 10:59 AM   Modules accepted: Orders

## 2022-11-26 ENCOUNTER — Telehealth: Payer: Self-pay | Admitting: Anesthesiology

## 2022-11-26 ENCOUNTER — Other Ambulatory Visit: Payer: PPO

## 2022-11-26 ENCOUNTER — Emergency Department (HOSPITAL_COMMUNITY): Payer: PPO

## 2022-11-26 ENCOUNTER — Other Ambulatory Visit: Payer: Self-pay | Admitting: Internal Medicine

## 2022-11-26 ENCOUNTER — Emergency Department (HOSPITAL_COMMUNITY)
Admission: EM | Admit: 2022-11-26 | Discharge: 2022-11-27 | Disposition: A | Discharge status: Home / Self Care | Payer: PPO | Attending: Emergency Medicine | Admitting: Emergency Medicine

## 2022-11-26 ENCOUNTER — Encounter (HOSPITAL_COMMUNITY): Payer: Self-pay

## 2022-11-26 DIAGNOSIS — I10 Essential (primary) hypertension: Secondary | ICD-10-CM | POA: Insufficient documentation

## 2022-11-26 DIAGNOSIS — R4182 Altered mental status, unspecified: Secondary | ICD-10-CM | POA: Insufficient documentation

## 2022-11-26 DIAGNOSIS — R350 Frequency of micturition: Secondary | ICD-10-CM

## 2022-11-26 DIAGNOSIS — Z79899 Other long term (current) drug therapy: Secondary | ICD-10-CM | POA: Diagnosis not present

## 2022-11-26 DIAGNOSIS — N3001 Acute cystitis with hematuria: Secondary | ICD-10-CM | POA: Diagnosis not present

## 2022-11-26 LAB — URINALYSIS, ROUTINE W REFLEX MICROSCOPIC
Bilirubin Urine: NEGATIVE
Glucose, UA: NEGATIVE mg/dL
Hgb urine dipstick: NEGATIVE
Ketones, ur: NEGATIVE mg/dL
Nitrite: NEGATIVE
Protein, ur: 30 mg/dL — AB
Specific Gravity, Urine: 1.023 (ref 1.005–1.030)
WBC, UA: >50 WBC/hpf (ref 0–5)
pH: 5 (ref 5.0–8.0)

## 2022-11-26 LAB — RAPID URINE DRUG SCREEN, HOSP PERFORMED
Amphetamines: NOT DETECTED
Barbiturates: NOT DETECTED
Benzodiazepines: NOT DETECTED
Cocaine: NOT DETECTED
Opiates: NOT DETECTED
Tetrahydrocannabinol: NOT DETECTED

## 2022-11-26 LAB — CBC WITH DIFFERENTIAL/PLATELET
Abs Immature Granulocytes: 0.02 10*3/uL (ref 0.00–0.07)
Basophils Absolute: 0.1 10*3/uL (ref 0.0–0.1)
Basophils Relative: 1 %
Eosinophils Absolute: 0.1 10*3/uL (ref 0.0–0.5)
Eosinophils Relative: 2 %
HCT: 37.3 % (ref 36.0–46.0)
Hemoglobin: 12.5 g/dL (ref 12.0–15.0)
Immature Granulocytes: 0 %
Lymphocytes Relative: 22 %
Lymphs Abs: 1.7 10*3/uL (ref 0.7–4.0)
MCH: 27.7 pg (ref 26.0–34.0)
MCHC: 33.5 g/dL (ref 30.0–36.0)
MCV: 82.7 fL (ref 80.0–100.0)
Monocytes Absolute: 0.6 10*3/uL (ref 0.1–1.0)
Monocytes Relative: 8 %
Neutro Abs: 5.5 10*3/uL (ref 1.7–7.7)
Neutrophils Relative %: 67 %
Platelets: 230 10*3/uL (ref 150–400)
RBC: 4.51 MIL/uL (ref 3.87–5.11)
RDW: 13.9 % (ref 11.5–15.5)
WBC: 8 10*3/uL (ref 4.0–10.5)
nRBC: 0 % (ref 0.0–0.2)

## 2022-11-26 LAB — COMPREHENSIVE METABOLIC PANEL
ALT: 14 U/L (ref 0–44)
AST: 47 U/L — ABNORMAL HIGH (ref 15–41)
Albumin: 3.9 g/dL (ref 3.5–5.0)
Alkaline Phosphatase: 85 U/L (ref 38–126)
Anion gap: 16 — ABNORMAL HIGH (ref 5–15)
BUN: 20 mg/dL (ref 8–23)
CO2: 25 mmol/L (ref 22–32)
Calcium: 9.7 mg/dL (ref 8.9–10.3)
Chloride: 97 mmol/L — ABNORMAL LOW (ref 98–111)
Creatinine, Ser: 0.87 mg/dL (ref 0.44–1.00)
GFR, Estimated: >60 mL/min (ref >60)
Glucose, Bld: 116 mg/dL — ABNORMAL HIGH (ref 70–99)
Potassium: 3.3 mmol/L — ABNORMAL LOW (ref 3.5–5.1)
Sodium: 138 mmol/L (ref 135–145)
Total Bilirubin: 0.6 mg/dL (ref 0.3–1.2)
Total Protein: 7.2 g/dL (ref 6.5–8.1)

## 2022-11-26 LAB — CBG MONITORING, ED: Glucose-Capillary: 117 mg/dL — ABNORMAL HIGH (ref 70–99)

## 2022-11-26 NOTE — Telephone Encounter (Signed)
Tried calling patient's daughter in law, No answer. Unable to LVM VM Full.  Per Dr.Jaffe last note and Dr.Hill, Dr. Everlena Cooper had previous said he wanted to check for UTI and if negative then get MRI brain w/wo contrast. I do not see that she provided a urine sample. Can you ask about this?

## 2022-11-26 NOTE — ED Provider Triage Note (Signed)
Emergency Medicine Provider Triage Evaluation Note  Paige [name] , a 72 y.o. female  was evaluated in triage.  Pt complains of Altered mental status.  Symptoms started 2 months ago, they have been worsening/over the last few weeks.  Patient has been having confusion, she is hearing things and seeing things that other people are not.  The visual and auditory hallucinations are new as of a few weeks ago.  Review of Systems  Per HPI  Physical Exam  BP (!) 130/55 (BP Location: Right Arm)   Pulse 85   Temp 98.4 F (36.9 C) (Oral)   Resp 17   Ht 5\' 2"  (1.575 m)   Wt 93 kg   SpO2 94%   BMI 37.50 kg/m  Gen:   Awake, no distress   Resp:  Normal effort  MSK:   Moves extremities without difficulty  Other:  Disoriented, oriented to self and location only.  Not oriented to month or year.  Follows commands, tearful  Medical Decision Making  Medically screening exam initiated at 9:32 PM.  Appropriate orders placed.  [facility] was informed that the remainder of the evaluation will be completed by another provider, this initial triage assessment does not replace that evaluation, and the importance of remaining in the ED until their evaluation is complete.     Theron Arista, PA-C 11/26/22 2134

## 2022-11-26 NOTE — Telephone Encounter (Signed)
Dawn advised please call the PCP office to see if the patient can just come and give the Urine sample and not have a visit.  If the test is negative per Dr.Jaffe note 3/19, Acute change in behavior. I would like to check a UA with culture. If negative, would proceed with MRI of brain WITH and without contrast as well as routine EEG. Check for UTI first.

## 2022-11-26 NOTE — Telephone Encounter (Signed)
Pt's daughter in law Dawn called stating pt has been hallucinating, seeing people in and outside house, she sits outside in the rain, continues to be very confused. States her memory is declining too fast, states if the pt's brain tumor is causing this. States can pt have her MRI sooner than wait a year. Requests call back.

## 2022-11-26 NOTE — ED Notes (Signed)
Family to stay with pt while in lobby. Informed to let us know if they plan to leave so pt can be back with [place] supervised.

## 2022-11-26 NOTE — ED Triage Notes (Signed)
Pt here with family arrived for POV for confusion since February that has been progressive in the past 2 weeks. Has been seen by several providers w/o a specific diagnosis along with medication changes. Pt has been wondering, having hallucinations, behavioral and mental changes that keep progressing. Pt calm and cooperative, NAD noted, GCS 14.   Pt has a brain tumor from former MRI, unsure if that is causing her symptoms

## 2022-11-27 MED ORDER — CEPHALEXIN 250 MG PO CAPS
500.0000 mg | ORAL_CAPSULE | Freq: Once | ORAL | Status: AC
  Administered 2022-11-27: 500 mg via ORAL
  Filled 2022-11-27: qty 2

## 2022-11-27 MED ORDER — CEPHALEXIN 500 MG PO CAPS: 500.0000 mg | ORAL_CAPSULE | Freq: Two times a day (BID) | ORAL | 0 refills | Status: DC

## 2022-11-27 NOTE — ED Provider Notes (Signed)
[place] EMERGENCY DEPARTMENT AT [facility] Provider Note   CSN: [id number] Arrival date & time: 11/26/22  2054     History  Chief Complaint  Patient presents with   Altered Mental Status   Level 5 caveat due to altered mental status Paige [name] is a 72 y.o. female.  The history is provided by the patient and a relative.   Patient with history of hypertension, anxiety, primary progressive aphasia, cerebral meningioma presents with increasing altered mental status  Most the history provided by grandson Family reports that since February patient has had progressively worsening confusion.  Over the past 1-2 weeks it has been getting worse.  Her behavior is worsened where she will stand outside without speaking to anyone.  She also appears to be hallucinating at times.  Patient does live alone but family lives close by.  There have been some medication changes.  She has been evaluated by neurology and PCP but unclear diagnosis. Patient is also had recent urinary frequency Home Medications Prior to Admission medications   Medication Sig Start Date End Date Taking? Authorizing Provider  cephALEXin (KEFLEX) 500 MG capsule Take 1 capsule (500 mg total) by mouth 2 (two) times daily. 11/27/22  Yes Zadie Rhine, MD  bisoprolol-hydrochlorothiazide [facility]) 5-6.25 MG tablet Take 1 tablet by mouth once daily 10/20/22   Pincus Sanes, MD  escitalopram (LEXAPRO) 20 MG tablet Take 1 tablet (20 mg total) by mouth daily. 05/06/22 05/06/23  Plovsky, Earvin Hansen, MD  Eszopiclone 3 MG TABS Take 1 tablet (3 mg total) by mouth at bedtime as needed. Take immediately before bedtime 11/04/22   Archer Asa, MD  levothyroxine (SYNTHROID) 112 MCG tablet Take 1 tablet by mouth once daily 10/20/22   Pincus Sanes, MD  lurasidone (LATUDA) 20 MG TABS tablet Take 1 tablet (20 mg total) by mouth at bedtime. 11/04/22   Plovsky, Earvin Hansen, MD  pravastatin (PRAVACHOL) 20 MG tablet Take 1 tablet by mouth once  daily 10/20/22   Pincus Sanes, MD      Allergies    Asa [aspirin], Augmentin [amoxicillin-pot clavulanate], Crestor [rosuvastatin], and Morphine and related    Review of Systems   Review of Systems  Unable to perform ROS: Mental status change    Physical Exam Updated Vital Signs BP 125/63 (BP Location: Left Arm)   Pulse 62   Temp 97.9 F (36.6 C) (Oral)   Resp 18   Ht 1.575 m (5\' 2" )   Wt 93 kg   SpO2 97%   BMI 37.50 kg/m  Physical Exam CONSTITUTIONAL: Elderly, resting comfortably HEAD: Normocephalic/atraumatic EYES: EOMI/PERRL ENMT: Mucous membranes moist NECK: supple no meningeal signs CV: S1/S2 noted, no murmurs/rubs/gallops noted LUNGS: Lungs are clear to auscultation bilaterally, no apparent distress ABDOMEN: soft, nontender NEURO: Pt is awake/alert, appears mildly confused.  No acute distress is noted, answers most questions appropriately No arm or leg drift is noted EXTREMITIES:  full ROM SKIN: warm, color normal PSYCH: Flat affect  ED Results / Procedures / Treatments   Labs (all labs ordered are listed, but only abnormal results are displayed) Labs Reviewed  COMPREHENSIVE METABOLIC PANEL - Abnormal; Notable for the following components:      Result Value   Potassium 3.3 (*)    Chloride 97 (*)    Glucose, Bld 116 (*)    AST 47 (*)    Anion gap 16 (*)    All other components within normal limits  URINALYSIS, ROUTINE W REFLEX MICROSCOPIC - Abnormal;  Notable for the following components:   Color, Urine AMBER (*)    APPearance CLOUDY (*)    Protein, ur 30 (*)    Leukocytes,Ua LARGE (*)    Bacteria, UA FEW (*)    All other components within normal limits  CBG MONITORING, ED - Abnormal; Notable for the following components:   Glucose-Capillary 117 (*)    All other components within normal limits  CBC WITH DIFFERENTIAL/PLATELET  RAPID URINE DRUG SCREEN, HOSP PERFORMED    EKG EKG Interpretation  Date/Time:  Wednesday November 26 2022 21:27:22  EDT Ventricular Rate:  71 PR Interval:    QRS Duration: 76 QT Interval:  368 QTC Calculation: 399 R Axis:   27 Text Interpretation: Sinus rhythm Cannot rule out Anterior infarct , age undetermined Abnormal ECG Interpretation limited secondary to artifact Confirmed by Zadie Rhine ([id number]) on 11/27/2022 4:02:21 AM  Radiology CT Head Wo Contrast  Result Date: 11/26/2022 CLINICAL DATA:  Mental status change EXAM: CT HEAD WITHOUT CONTRAST TECHNIQUE: Contiguous axial images were obtained from the base of the skull through the vertex without intravenous contrast. RADIATION DOSE REDUCTION: This exam was performed according to the departmental dose-optimization program which includes automated exposure control, adjustment of the mA and/or kV according to patient size and/or use of iterative reconstruction technique. COMPARISON:  MRI 07/30/2022, CT brain 02/10/2019 FINDINGS: Brain: No acute territorial infarction, hemorrhage or intracranial mass. Moderate atrophy. Mild white matter hypodensity consistent with chronic small vessel ischemic change. Nonenlarged ventricles. Calcified right frontal convexity extra-axial mass consistent, possible meningioma. Vascular: No hyperdense vessels.  No unexpected calcification Skull: Normal. Negative for fracture or focal lesion. Sinuses/Orbits: No acute finding. Other: None IMPRESSION: 1. No CT evidence for acute intracranial abnormality. 2. Atrophy and chronic small vessel ischemic changes of the white matter. Electronically Signed   By: Jasmine Pang M.D.   On: 11/26/2022 22:52   DG Chest 2 View  Result Date: 11/26/2022 CLINICAL DATA:  Altered mental status EXAM: CHEST - 2 VIEW COMPARISON:  Chest x-ray 02/10/2019 FINDINGS: The heart size and mediastinal contours are within normal limits. Both lungs are clear. There are healed right-sided rib fractures. There surgical clips in the right abdomen. IMPRESSION: No active cardiopulmonary disease. Electronically Signed   By:  Darliss Cheney M.D.   On: 11/26/2022 22:01    Procedures Procedures    Medications Ordered in ED Medications  cephALEXin (KEFLEX) capsule 500 mg (500 mg Oral Given 11/27/22 0453)    ED Course/ Medical Decision Making/ A&P                             Medical Decision Making Risk Prescription drug management.   This patient presents to the ED for concern of altered mental status, this involves an extensive number of treatment options, and is a complaint that carries with it a high risk of complications and morbidity.  The differential diagnosis includes but is not limited to CVA, intracranial hemorrhage, acute coronary syndrome, renal failure, urinary tract infection, electrolyte disturbance, pneumonia   Comorbidities that complicate the patient evaluation: Patient's presentation is complicated by their history of aphasia  Social Determinants of Health: Patient's  lives alone   increases the complexity of managing their presentation  Additional history obtained: Additional history obtained from family Records reviewed Primary Care Documents  Lab Tests: I Ordered, and personally interpreted labs.  The pertinent results include: UTI noted  Imaging Studies ordered: I ordered imaging studies including CT  scan head and X-ray chest   I independently visualized and interpreted imaging which showed no acute findings I agree with the radiologist interpretation   Medicines ordered and prescription drug management: I ordered medication including Keflex for UTI  Complexity of problems addressed: Patient's presentation is most consistent with  acute presentation with potential threat to life or bodily function  Disposition: After consideration of the diagnostic results and the patient's response to treatment,  I feel that the patent would benefit from discharge   .    Patient presents for progressively worsening confusion and behavior over the past several months.  They report it  has been worse over the past 1 to 2 weeks.  Patient was found to have a UTI which could have worsened her symptoms.  However she is well-appearing, not septic appearing.  She is resting comfortably.  Vitals are appropriate.  The rest of her workup was unremarkable. I had a long discussion with patient, grandson and son. Will place home health orders.  Will start oral antibiotics.  She has good family support at home.        Final Clinical Impression(s) / ED Diagnoses Final diagnoses:  Acute cystitis with hematuria    Rx / DC Orders ED Discharge Orders          Ordered    cephALEXin (KEFLEX) 500 MG capsule  2 times daily        11/27/22 0458              Zadie RhineWickline, Kyeisha Janowicz, MD 11/27/22 [phone]

## 2022-11-28 ENCOUNTER — Ambulatory Visit: Payer: PPO | Admitting: Obstetrics and Gynecology

## 2022-12-01 ENCOUNTER — Telehealth (HOSPITAL_COMMUNITY): Payer: Self-pay | Admitting: *Deleted

## 2022-12-01 ENCOUNTER — Telehealth: Payer: Self-pay

## 2022-12-01 NOTE — Telephone Encounter (Signed)
Reviewed ED note on 4/10.  Appears that patient did have UT

## 2022-12-01 NOTE — Telephone Encounter (Signed)
      Patient  visit on 4/11  at [facility]  Have you been able to follow up with your primary care physician? Yes   The patient was or was not able to obtain any needed medicine or equipment. Yes   Are there diet recommendations that you are having difficulty following? Na   Patient expresses understanding of discharge instructions and education provided has no other needs at this time.  Yes      Korie Brabson [facility] Guide, [place] [phone] [address], [place], [place] [address] Phone: [phone] Email: [email or web address]    

## 2022-12-01 NOTE — Telephone Encounter (Signed)
Patient daughter called stated that has upcoming APPT 12/16/22. And there has been some concern that the  Auditory  Hallucinations is now Visual Hallucinations . Went to the E>R> & was told that patient has a UTI. Concerned that if the [facility] will may be harmful to patient causing ideas of self harm??  LVM that [facility] is located on [address] [place] -- opened 7 days/week 24 hrs a day.  Office phone # left to call to see if a sooner appt may be available

## 2022-12-02 ENCOUNTER — Telehealth (HOSPITAL_COMMUNITY): Payer: Self-pay | Admitting: *Deleted

## 2022-12-02 NOTE — Telephone Encounter (Signed)
Pt's daughter in law LVM stating that she is concerned about pt continuing AVH/psychotic behaviors and would like to speak to provider. Pt was recently seen in ED and diagnosed with a UTI. Looks like pt appointment with Dr. Everlena Cooper was cx abut looks like she has an appointment scheduled for neuropsych testing [facility] on 02/03/23. Pt has f/u in this office on 12/16/22. If possible please call Dawn @ [phone].

## 2022-12-02 NOTE — Telephone Encounter (Signed)
Please contact Dr Donell Beers. His patient.

## 2022-12-04 ENCOUNTER — Ambulatory Visit (INDEPENDENT_AMBULATORY_CARE_PROVIDER_SITE_OTHER)
Admission: EM | Admit: 2022-12-04 | Discharge: 2022-12-04 | Disposition: A | Discharge status: Home / Self Care | Payer: PPO | Source: Home / Self Care

## 2022-12-04 ENCOUNTER — Encounter (HOSPITAL_COMMUNITY): Payer: Self-pay | Admitting: Registered Nurse

## 2022-12-04 ENCOUNTER — Emergency Department (HOSPITAL_COMMUNITY)
Admission: EM | Admit: 2022-12-04 | Discharge: 2022-12-04 | Disposition: A | Discharge status: Home / Self Care | Payer: PPO | Attending: Emergency Medicine | Admitting: Emergency Medicine

## 2022-12-04 ENCOUNTER — Other Ambulatory Visit: Payer: Self-pay

## 2022-12-04 DIAGNOSIS — Z91148 Patient's other noncompliance with medication regimen for other reason: Secondary | ICD-10-CM | POA: Insufficient documentation

## 2022-12-04 DIAGNOSIS — Z79899 Other long term (current) drug therapy: Secondary | ICD-10-CM | POA: Insufficient documentation

## 2022-12-04 DIAGNOSIS — F329 Major depressive disorder, single episode, unspecified: Secondary | ICD-10-CM | POA: Diagnosis present

## 2022-12-04 DIAGNOSIS — R410 Disorientation, unspecified: Secondary | ICD-10-CM | POA: Diagnosis not present

## 2022-12-04 DIAGNOSIS — R440 Auditory hallucinations: Secondary | ICD-10-CM

## 2022-12-04 DIAGNOSIS — Z87440 Personal history of urinary (tract) infections: Secondary | ICD-10-CM | POA: Insufficient documentation

## 2022-12-04 DIAGNOSIS — F333 Major depressive disorder, recurrent, severe with psychotic symptoms: Secondary | ICD-10-CM

## 2022-12-04 DIAGNOSIS — N390 Urinary tract infection, site not specified: Secondary | ICD-10-CM

## 2022-12-04 HISTORY — DX: Major depressive disorder, single episode, unspecified: F32.9

## 2022-12-04 LAB — URINALYSIS, W/ REFLEX TO CULTURE (INFECTION SUSPECTED)
Bilirubin Urine: NEGATIVE
Glucose, UA: NEGATIVE mg/dL
Hgb urine dipstick: NEGATIVE
Ketones, ur: NEGATIVE mg/dL
Nitrite: NEGATIVE
Protein, ur: 30 mg/dL — AB
Specific Gravity, Urine: 1.023 (ref 1.005–1.030)
pH: 6 (ref 5.0–8.0)

## 2022-12-04 MED ORDER — QUETIAPINE FUMARATE 25 MG PO TABS: 25.0000 mg | ORAL_TABLET | Freq: Every day | ORAL | 0 refills | Status: DC

## 2022-12-04 MED ORDER — QUETIAPINE FUMARATE 50 MG PO TABS
25.0000 mg | ORAL_TABLET | Freq: Once | ORAL | Status: AC
  Administered 2022-12-04: 25 mg via ORAL
  Filled 2022-12-04: qty 1

## 2022-12-04 MED ORDER — CEFDINIR 300 MG PO CAPS: 300.0000 mg | ORAL_CAPSULE | Freq: Two times a day (BID) | ORAL | 0 refills | Status: AC

## 2022-12-04 NOTE — ED Notes (Signed)
Patient was discharged from the [facility] by the provider. Patient was given community resources.

## 2022-12-04 NOTE — ED Provider Notes (Signed)
[place] EMERGENCY DEPARTMENT AT [facility] Provider Note   CSN: [id number] Arrival date & time: 12/04/22  1513     History Chief Complaint  Patient presents with   Hallucinations    HPI Paige [name] is a 72 y.o. female presenting for chief complaint hallucinations.  Auditory only.  They have been present for approximately 3 months. Marginally worse today.  Was seen by psychiatry this morning at the behavioral health urgent care deemed stable for outpatient care and management. Denies fevers chills nausea vomiting syncope or shortness of breath.  Finished antibiotics last night for urinary tract infection and symptoms have mildly improved. Was seen 2 weeks ago and had no focal findings on CT head or objective evaluation at that time. Patient his son is at bedside with her and states they are presenting today directly after the psychiatry appointment for second opinion.  He feels comfortable continuing to provide care for her at home.  Patient's recorded medical, surgical, social, medication list and allergies were reviewed in the Snapshot window as part of the initial history.   Review of Systems   Review of Systems  Constitutional:  Negative for chills and fever.  HENT:  Negative for ear pain and sore throat.   Eyes:  Negative for pain and visual disturbance.  Respiratory:  Negative for cough and shortness of breath.   Cardiovascular:  Negative for chest pain and palpitations.  Gastrointestinal:  Negative for abdominal pain and vomiting.  Genitourinary:  Negative for dysuria and hematuria.  Musculoskeletal:  Negative for arthralgias and back pain.  Skin:  Negative for color change and rash.  Neurological:  Negative for seizures and syncope.  Psychiatric/Behavioral:  Positive for hallucinations.   All other systems reviewed and are negative.   Physical Exam Updated Vital Signs BP (!) 153/77 (BP Location: Left Arm)   Pulse 79   Temp 98.6 F (37 C) (Oral)    Resp 18   Ht  (1.575 m)   Wt 93 kg   SpO2 95%   BMI 37.50 kg/m  Physical Exam Vitals and nursing note reviewed.  Constitutional:      General: She is not in acute distress.    Appearance: She is well-developed.  HENT:     Head: Normocephalic and atraumatic.  Eyes:     Conjunctiva/sclera: Conjunctivae normal.  Cardiovascular:     Rate and Rhythm: Normal rate and regular rhythm.     Heart sounds: No murmur heard. Pulmonary:     Effort: Pulmonary effort is normal. No respiratory distress.     Breath sounds: Normal breath sounds.  Abdominal:     General: There is no distension.     Palpations: Abdomen is soft.     Tenderness: There is no abdominal tenderness. There is no right CVA tenderness or left CVA tenderness.  Musculoskeletal:        General: No swelling or tenderness. Normal range of motion.     Cervical back: Neck supple.  Skin:    General: Skin is warm and dry.  Neurological:     General: No focal deficit present.     Mental Status: She is alert and oriented to person, place, and time. Mental status is at baseline.     Cranial Nerves: No cranial nerve deficit.      ED Course/ Medical Decision Making/ A&P    Procedures Procedures   Medications Ordered in ED Medications  QUEtiapine (SEROQUEL) tablet 25 mg (25 mg Oral Given 12/04/22 1650)  Medical Decision Making:   Patient's history present on this and physical exam findings are grossly not concerning for any acute pathology.  Her hallucinations been present over months, she is already undergone medical evaluation and clearance.  Saw psychiatry this morning and was changed to Seroquel due to intolerance of Latuda has not started this medication yet.  Patient's son brings her in for reassessment of urine given completion of UTI treatment last night. Will send off second urine sample.  I do not believe patient has any emergent psychiatric condition based on hemodynamic stability.  Patient son feels  comfortable continuing to provide care for thePatient.  UA ordered with continued evidence of infection. Antibiotics will be broadened to [facility].  A culture was sent last week but it did not make it to the lab.  A new culture is ordered.  Disposition:  I have considered need for hospitalization, however, considering all of the above, I believe this patient is stable for discharge at this time.  Patient/family educated about specific return precautions for given chief complaint and symptoms.  Patient/family educated about follow-up with PCP.     Patient/family expressed understanding of return precautions and need for follow-up. Patient spoken to regarding all imaging and laboratory results and appropriate follow up for these results. All education provided in verbal form with additional information in written form. Time was allowed for answering of patient questions. Patient discharged.    Emergency Department Medication Summary:   Medications  QUEtiapine (SEROQUEL) tablet 25 mg (25 mg Oral Given 12/04/22 1650)         Clinical Impression:  1. Auditory hallucination      Data Unavailable   Final Clinical Impression(s) / ED Diagnoses Final diagnoses:  Auditory hallucination    Rx / DC Orders ED Discharge Orders          Ordered    cefdinir (OMNICEF) 300 MG capsule  2 times daily        12/04/22 1721              Glyn Ade, MD 12/05/22 0007

## 2022-12-04 NOTE — ED Provider Notes (Signed)
Behavioral Health [name] Medical Screening Exam  Patient Name: Paige [name] MRN: [id number] Date of Evaluation: 12/04/22 Chief Complaint:   Diagnosis:  Final diagnoses:  MDD (major depressive disorder), recurrent, severe, with psychosis    History of Present illness: Paige [name] is a 72 y.o. female patient presented to [name] as a walk in accompanied by her son and daughter in law with complaints of auditory and visual hallucinations  Paige [name], 72 y.o., female patient seen face to face by this provider, consulted with Dr. Nelly Rout; and chart reviewed on 12/04/22.  On evaluation Paige [name] reports she has been hearing voices and seeing things other people cannot.  Patient reports that she has not been taking the medication that was prescribed by her physician (referring to [facility]) stating that the medicine belonged to someone else.  Patient is accompanied by her son and daughter-in-law.  Her son reports that patient was first started on Abilify but his mother reported that it made the voices worse so when they saw the psychiatrist on 11/04/2022 Abilify was stopped and Latuda was started.  Reports she has not been taking Latuda because she states that the medicine belongs to someone else.  Also reports patient was started on Keflex for a urinary tract infection and the last dose of antibiotic was yesterday 12/03/2022.  Reports patient also has an appointment with a neurologist coming up to assess for dementia.  Reports that primary concern is the auditory and visual hallucinations.  Reports is difficult to get patient to do anything related to "she always assumed it something bad" reports follow-up appointment with Dr. Donell Beers on 12/16/2022 but needed something to be done now related to the auditory hallucinations.  Patient denies suicidal/self-harm/homicidal ideation. During evaluation Paige [name] is sitting in chair with no noted distress.  She is  alert/oriented x 3, calm, cooperative, attentive, and responses were relevant and mostly appropriate to assessment questions.  At times patient would make statements at this time when was telling her something different or she was make the statement that she hearing a voice with someone that is not there is telling her something different.  She spoke in a clear tone at moderate volume, and normal pace, with good eye contact.   She denies suicidal/self-harm/homicidal ideation.  Objectively:  there is evidence that patient is responding to auditory hallucination and delusional thinking.  Antibiotic completed.  Encouraged patient to follow up with PCP for urinalysis, culture and sensitivity.  Keep scheduled appointment with neurologist.  Also discussed starting Seroquel 25 mg daily.  Educated on efficacy and side effects.  Agreed to trial of Seroquel 25 mg daily.  Will follow up with Dr. Donell Beers   At this time Paige [name] is educated and verbalizes understanding of mental health resources and other crisis services in the community. She is instructed to call 911 and present to the nearest emergency room should she experience any suicidal/homicidal ideation, auditory/visual/hallucinations, or detrimental worsening of her mental health condition.    Flowsheet Row ED from 12/04/2022 in [facility] ED from 11/26/2022 in [facility] Emergency Department at [facility]  C-SSRS RISK CATEGORY No Risk No Risk       Psychiatric Specialty Exam  Presentation  General Appearance:Appropriate for Environment; Casual  Eye Contact:Good  Speech:Clear and Coherent; Normal Rate  Speech Volume:Normal  Handedness:Right   Mood and Affect  Mood: Anxious; Dysphoric  Affect: Congruent   Thought Process  Thought Processes:  Linear; Coherent  Descriptions of Associations:Circumstantial  Orientation:Full (Time, Place and Person)  Thought Content:Rumination     Hallucinations:Auditory (Possible continued delirium as a result of recent UTI, also memory issues and scheduled to see a neurologist.) Hearing voices of people she knows,  now reporting she is seeing people but no elaboration  Ideas of Reference:Paranoia  Suicidal Thoughts:No  Homicidal Thoughts:No   Sensorium  Memory: Immediate Fair; Recent Fair  Judgment: Fair  Insight: Fair   Art therapist  Concentration: Fair  Attention Span: Fair  Recall: [employer] of Knowledge: Fair  Language: Good   Psychomotor Activity  Psychomotor Activity: Normal   Assets  Assets: Communication Skills; Desire for Improvement; Financial Resources/Insurance; Housing; Leisure Time; Resilience; Social Support; Transportation   Sleep  Sleep: Fair  Number of hours: No data recorded  Physical Exam: Physical Exam Vitals and nursing note reviewed. Exam conducted with a chaperone present (With son and daughter in law).  Constitutional:      Appearance: Normal appearance.  HENT:     Head: Normocephalic.  Eyes:     Conjunctiva/sclera: Conjunctivae normal.  Cardiovascular:     Rate and Rhythm: Normal rate.  Pulmonary:     Effort: Pulmonary effort is normal. No respiratory distress.  Musculoskeletal:        General: Normal range of motion.  Skin:    General: Skin is warm and dry.  Neurological:     Mental Status: She is alert and oriented to person, place, and time.  Psychiatric:        Attention and Perception: She perceives auditory and visual hallucinations.        Mood and Affect: Mood is anxious and depressed.        Speech: Speech normal.        Behavior: Behavior normal. Behavior is cooperative.        Thought Content: Thought content is paranoid. Delusional: Psychosis.Thought content does not include homicidal or suicidal ideation.        Judgment: Judgment is impulsive.    Review of Systems  Constitutional:        No other verbal complaints   Psychiatric/Behavioral:  Positive for depression and hallucinations. Negative for substance abuse and suicidal ideas. The patient is nervous/anxious and has insomnia.    Blood pressure (!) 140/71, pulse 74, temperature 98.6 F (37 C), resp. rate 18, SpO2 98 %. There is no height or weight on file to calculate BMI.  Musculoskeletal: Strength & Muscle Tone: within normal limits Gait & Station: normal Patient leans: N/A   [facility] MSE Discharge Disposition for Follow up and Recommendations: Based on my evaluation the patient does not appear to have an emergency medical condition and can be discharged with resources and follow up [name] in outpatient services for Medication Management   Rollande Thursby, NP 12/04/2022, 2:13 PM

## 2022-12-04 NOTE — ED Triage Notes (Signed)
Pt presents to ED with family member that states that pt has been hearing voices telling her to do things. Pt has been found wandering outside, hx of same for a few weeks.

## 2022-12-04 NOTE — Progress Notes (Signed)
   12/04/22 1354  [facility] Triage Screening (Walk-ins at [facility] only)  How Did You Hear About [place]? Family/Friend  What Is the Reason for Your Visit/Call Today? Rakeisha Nyce is a 72 y/o female that presents to the [facility], voluntarily. She is accompanied by her son and daughter in law. Patient with a history of depression and recent onset of auditory hallucinations (February 2023). Also, recently treated for a UTI. Since treatment, patient has continued to endorse auditory hallucinations and the voices are getting louder. Patient's hallucinations, consist of her seeing people in and outside house, and confusion. Patient denies SI, HI, and AVH's. No alcohol/drug use. Patient's psychiatrist is Dr. Lolly Mustache. Possible, issue with medication compliance. Temporarily living with son and daughter in law due to psychiatric symptoms.  How Long Has This Been Causing You Problems? 1-6 months  Have You Recently Had Any Thoughts About Hurting Yourself? No  Are You Planning to Commit Suicide/Harm Yourself At This time? No  Have you Recently Had Thoughts About Hurting Someone Karolee Ohs? No  Are You Planning To Harm Someone At This Time? No  Are you currently experiencing any auditory, visual or other hallucinations? No  Have You Used Any Alcohol or Drugs in the Past 24 Hours? No  Do you have any current medical co-morbidities that require immediate attention? No  What Do You Feel Would Help You the Most Today? Medication(s);Stress Management  If access to [facility] was not available, would you have sought care in the Emergency Department? No  Determination of Need Routine (7 days)  Options For Referral Outpatient Therapy;Medication Management

## 2022-12-05 LAB — URINE CULTURE: Culture: NO GROWTH

## 2022-12-09 ENCOUNTER — Telehealth: Payer: Self-pay

## 2022-12-09 ENCOUNTER — Ambulatory Visit: Payer: PPO | Admitting: Obstetrics and Gynecology

## 2022-12-09 NOTE — Telephone Encounter (Signed)
        Patient  visited [place] on 4/18 Telephone encounter attempt : 1st   A HIPAA compliant voice message was left requesting a return call.  Instructed patient to call back      Evertt Chouinard [facility], [place] [phone] [address], [place], [place] [address] Phone: [phone] Email: [email or web address]       

## 2022-12-10 ENCOUNTER — Telehealth: Payer: Self-pay

## 2022-12-10 NOTE — Telephone Encounter (Signed)
        Patient  visited [place] on 4/18     Telephone encounter attempt :  2nd  A HIPAA compliant voice message was left requesting a return call.  Instructed patient to call back     Adina Puzzo [facility], [place] [phone] [address], [place], [place] [address] Phone: [phone] Email: [email or web address]       

## 2022-12-15 ENCOUNTER — Encounter: Payer: Self-pay | Admitting: Obstetrics and Gynecology

## 2022-12-15 ENCOUNTER — Ambulatory Visit (INDEPENDENT_AMBULATORY_CARE_PROVIDER_SITE_OTHER): Payer: PPO | Admitting: Obstetrics and Gynecology

## 2022-12-15 VITALS — BP 108/65 | HR 88

## 2022-12-15 DIAGNOSIS — N3281 Overactive bladder: Secondary | ICD-10-CM

## 2022-12-15 MED ORDER — VIBEGRON 75 MG PO TABS: 75.0000 mg | ORAL_TABLET | Freq: Every day | ORAL | 5 refills | Status: DC

## 2022-12-15 NOTE — Progress Notes (Signed)
[facility] Return Visit  SUBJECTIVE  History of Present Illness: Paige [name] is a 72 y.o. female seen in follow-up for OAB. Plan at last visit was continue [place].  Patient has recently been to the ER x2 for UTI/hallucinations. No urine culture was done on 4/10 but the culture on 4/18 showed no bacterial growth.      Past Medical History: Patient  has a past medical history of ADD (attention deficit disorder), Anxiety, Arthritis, Carpal tunnel syndrome, Colon polyp, Constipation, Depression, Gallstones, GERD (gastroesophageal reflux disease), Hyperlipidemia, Hypertension, Status post dilation of esophageal narrowing, and Thyroid disease.   Past Surgical History: She  has a past surgical history that includes Cholecystectomy; Abdominal hysterectomy; Tubal ligation; fx leg; Nasal sinus surgery; Closed reduction hand fracture; Carpal tunnel release (Bilateral); Tibia fracture surgery; Shoulder surgery; Colonoscopy (2018); Colonoscopy (2013); and Polypectomy (2013).   Medications: She has a current medication list which includes the following prescription(s): bisoprolol-hydrochlorothiazide, escitalopram, eszopiclone, levothyroxine, pravastatin, quetiapine, and vibegron.   Allergies: Patient is allergic to asa [aspirin], augmentin [amoxicillin-pot clavulanate], crestor [rosuvastatin], and morphine and related.   Social History: Patient  reports that she has never smoked. She has never used smokeless tobacco. She reports that she does not drink alcohol and does not use drugs.      OBJECTIVE     Physical Exam: Vitals:   12/15/22 1524  BP: 108/65  Pulse: 88   Gen: No apparent distress, A&O x 3.  Detailed Urogynecologic Evaluation:  Deferred.    ASSESSMENT AND PLAN    Paige [name] is a 72 y.o. with:  1. Overactive bladder    Patient  is not interested in SNM or Bladder Botox. She is very anxious today and is seemingly not in a good frame of mind to make  decisions, she is seeing neurology. I asked patient if I could bring her son back so we could all talk and she said she would prefer it just be her. As of right now patient will try the Leslye Peer again as she reports prior to the new year it was 100$ and not affordable for her. New prescription sent to pharmacy and pharmacy said the cost is $11.30 per month.   Patient to follow up in 6 weeks or sooner if needed.

## 2022-12-15 NOTE — Patient Instructions (Signed)
The options other than medication are the bladder botox and sacral neuro modulation (pacemaker for the bladder)  Please consider these  if you feel like the medication is not helping. I will work on getting a decreased price on the medication.

## 2022-12-16 ENCOUNTER — Ambulatory Visit (HOSPITAL_COMMUNITY): Payer: PPO | Admitting: Psychiatry

## 2022-12-16 ENCOUNTER — Encounter (HOSPITAL_COMMUNITY): Payer: Self-pay | Admitting: Psychiatry

## 2022-12-16 VITALS — BP 116/72 | HR 70 | Temp 98.7°F | Ht 62.0 in | Wt 193.0 lb

## 2022-12-16 DIAGNOSIS — F323 Major depressive disorder, single episode, severe with psychotic features: Secondary | ICD-10-CM

## 2022-12-16 MED ORDER — LURASIDONE HCL 40 MG PO TABS: 40.0000 mg | ORAL_TABLET | Freq: Every day | ORAL | 3 refills | Status: DC

## 2022-12-16 NOTE — Progress Notes (Signed)
Patient ID: Paige [name], female   DOB: June 15, 1951, 72 y.o.   MRN: [id number] Patient ID: Paige [name], female   DOB: 1951/03/22, 72 y.o.   MRN: [id number] [facility] MD Progress Note  12/16/2022 3:01 PM Paige December Divito   Pa time.st Medical History:   MRN:  [id number] Diagnosis major depressi   Today the patient is seen with her 2 sons.  The patient is not doing well.  She has persistent auditory hallucinations.  She took the low-dose Latuda 20 mg for about 7 or 8 days but because her voices were worse or no better they went to some urgent care center.  She was seen by the emergency room provider who commented that she was intolerant of the 2.  But there is no evidence that she is intolerant she has been taking it long enough to have any true effectiveness.  Along the way she she a urinalysis that demonstrated that she had a bladder infection.  She again was seen a few days later and again had another urinalysis that showed her bladder infection was continuing despite some antibiotics.  She probably another antibiotic which is finished about 4 or 5 days ago.  I think this patient's psychotic symptomatology may likely be due as much to a bladder infection as to anything.  She has finished the antibiotic she is still hearing voices.  She was put on Seroquel 25 mg.  Today we will discontinue the Seroquel and begin on her higher dose of Latuda.  She is instructed to 40 mg pill at dinner.  We shared with him that is going to take at least a month or so to have any effectiveness.  She has a return appointment to see me in 5 weeks.  Patient is not suicidal.  Apparently they have been told that her meningioma is stable and has not changed.  The 2 sons are very involved in her care.  At this time the patient i Past Medical History:  Diagnosis Date   ADD (attention deficit disorder)    Pt has hard time focusing   Anxiety    on meds   Arthritis    Carpal tunnel syndrome    Colon polyp    Constipation     Depression    Gallstones    GERD (gastroesophageal reflux disease)    on meds   Hyperlipidemia    on meds   Hypertension    on meds   Status post dilation of esophageal narrowing    Thyroid disease    on meds    Past Surgical History:  Procedure Laterality Date   ABDOMINAL HYSTERECTOMY     CARPAL TUNNEL RELEASE Bilateral    CHOLECYSTECTOMY     CLOSED REDUCTION HAND FRACTURE     right hand   COLONOSCOPY  2018   JP-MAC-suprep(exc)-int hems-5 yr recall-   COLONOSCOPY  2013   TA   fx leg     NASAL SINUS SURGERY     POLYPECTOMY  2013   TA   SHOULDER SURGERY     reattac tendon; right shoulder   TIBIA FRACTURE SURGERY     steel rod right leg   TUBAL LIGATION     Family History:  Family History  Problem Relation Age of Onset   Colon polyps Mother 39   Hyperlipidemia Mother    Colon cancer Mother 54   Lung cancer Father    Leukemia Maternal Aunt    Cancer Paternal Grandfather  mets   Esophageal cancer Neg Hx    Rectal cancer Neg Hx    Stomach cancer Neg Hx    Breast cancer Neg Hx    Family Psychiatric  History:  Social History:  Social History   Substance and Sexual Activity  Alcohol Use No     Social History   Substance and Sexual Activity  Drug Use No    Social History   Socioeconomic History   Marital status: Widowed    Spouse name: Not on file   Number of children: 2   Years of education: Not on file   Highest education level: High school graduate  Occupational History   Occupation: retired    Associate Professor: UNEMPLOYED   Occupation: Orthoptist: [employer]  Tobacco Use   Smoking status: Never   Smokeless tobacco: Never  Vaping Use   Vaping Use: Never used  Substance and Sexual Activity   Alcohol use: No   Drug use: No   Sexual activity: Not Currently  Other Topics Concern   Not on file  Social History Narrative   Works at [employer] - husband died of lung cancer      No regular  exercise.   Are you right handed or left handed?    Are you currently employed ? Yes    What is your current occupation? at [employer]      Do you live at home alone? YEs   Who lives with you?    What type of home do you live in: 1 story or 2 story? 1       Social Determinants of Health   Financial Resource Strain: Low Risk  (02/10/2022)   Overall Financial Resource Strain (CARDIA)    Difficulty of Paying Living Expenses: Not very hard  Food Insecurity: No Food Insecurity (02/10/2022)   Hunger Vital Sign    Worried About Running Out of Food in the Last Year: Never true    Ran Out of Food in the Last Year: Never true  Transportation Needs: No Transportation Needs (02/10/2022)   PRAPARE - Administrator, Civil Service (Medical): No    Lack of Transportation (Non-Medical): No  Physical Activity: Insufficiently Active (02/10/2022)   Exercise Vital Sign    Days of Exercise per Week: 2 days    Minutes of Exercise per Session: 30 min  Stress: No Stress Concern Present (02/10/2022)   [employer] of Occupational Health - Occupational Stress Questionnaire    Feeling of Stress : Only a little  Social Connections: Socially Isolated (02/10/2022)   Social Connection and Isolation Panel [NHANES]    Frequency of Communication with Friends and Family: Twice a week    Frequency of Social Gatherings with Friends and Family: Twice a week    Attends Religious Services: Never    Database administrator or Organizations: No    Attends Banker Meetings: Never    Marital Status: Widowed   Additional Social History:                         Sleep: Fair  Appetite:  Good  Current Medications: Current Outpatient Medications  Medication Sig Dispense Refill   bisoprolol-hydrochlorothiazide (ZIAC) 5-6.25 MG tablet Take 1 tablet by mouth once daily 90 tablet 0   escitalopram (LEXAPRO) 20 MG tablet Take 1 tablet (20 mg total) by mouth daily. 30  tablet 7    Eszopiclone 3 MG TABS Take 1 tablet (3 mg total) by mouth at bedtime as needed. Take immediately before bedtime 30 tablet 5   levothyroxine (SYNTHROID) 112 MCG tablet Take 1 tablet by mouth once daily 90 tablet 0   lurasidone (LATUDA) 40 MG TABS tablet Take 1 tablet (40 mg total) by mouth daily with breakfast. 30 tablet 3   pravastatin (PRAVACHOL) 20 MG tablet Take 1 tablet by mouth once daily 90 tablet 0   Vibegron 75 MG TABS Take 1 tablet (75 mg total) by mouth daily. 30 tablet 5   No current facility-administered medications for this visit.    Lab Results: No results found for this or any previous visit (from the past 48 hour(s)).  Blood Alcohol level:  No results found for: "ETH"  Physical Findings: AIMS:  , ,  ,  ,    CIWA:    COWS:     Musculoskeletal: Strength & Muscle Tone: within normal limits Gait & Station: normal Patient leans: N/A  Psychiatric Specialty Exam: ROS  Blood pressure 116/72, pulse 70, temperature 98.7 F (37.1 C), height 5\' 2"  (1.575 m), weight 193 lb (87.5 kg), SpO2 99 %.Body mass index is 35.3 kg/m.  General Appearance: Fairly Groomed  Patent attorney::  Good  Speech:  Clear and Coherent  Volume:  Normal  Mood:  Euthymic  Affect:  Blunt  Thought Process:  Coherent  Orientation:  Full (Time, Place, and Person)  Thought Content:  WDL  Suicidal Thoughts:  No  Homicidal Thoughts:  No  Memory:  NA  Judgement:  NA  Insight:  Good  Psychomotor Activity:  Decreased  Concentration:  Fair  Recall:  [employer] of Knowledge:Good  Language: Fair  Akathisia:  No  Handed:  Right  AIMS (if indicated):     Assets:  Desire for Improvement  ADL's:  Intact  Cognition: WNL  Sleep:      Treatment Plan Summary: 12/16/2022, 3:01 PM  This patient's diagnosis is most likely delirium.  Today we will discontinue all her neuroleptics except for her Latuda.  She will take a higher dose of 40 mg.  Her second problem is major depression.  She will continue taking  her Lexapro as well.  Patient will be seen in 5 weeks.  Hopefully by that time any effect from her bladder infection will have resolved.

## 2022-12-18 ENCOUNTER — Ambulatory Visit: Payer: PPO | Admitting: Neurology

## 2022-12-18 DIAGNOSIS — M25561 Pain in right knee: Secondary | ICD-10-CM | POA: Diagnosis not present

## 2022-12-18 DIAGNOSIS — W01198A Fall on same level from slipping, tripping and stumbling with subsequent striking against other object, initial encounter: Secondary | ICD-10-CM | POA: Diagnosis not present

## 2023-01-19 ENCOUNTER — Other Ambulatory Visit: Payer: Self-pay | Admitting: Internal Medicine

## 2023-01-20 ENCOUNTER — Encounter (HOSPITAL_COMMUNITY): Payer: Self-pay | Admitting: Psychiatry

## 2023-01-20 ENCOUNTER — Ambulatory Visit (HOSPITAL_BASED_OUTPATIENT_CLINIC_OR_DEPARTMENT_OTHER): Payer: PPO | Admitting: Psychiatry

## 2023-01-20 ENCOUNTER — Other Ambulatory Visit: Payer: Self-pay

## 2023-01-20 VITALS — BP 132/74 | HR 69 | Ht 62.0 in | Wt 183.0 lb

## 2023-01-20 DIAGNOSIS — F323 Major depressive disorder, single episode, severe with psychotic features: Secondary | ICD-10-CM | POA: Diagnosis not present

## 2023-01-20 MED ORDER — ESCITALOPRAM OXALATE 20 MG PO TABS: 20.0000 mg | ORAL_TABLET | Freq: Every day | ORAL | 7 refills | Status: DC

## 2023-01-20 MED ORDER — LURASIDONE HCL 80 MG PO TABS: 40.0000 mg | ORAL_TABLET | Freq: Every day | ORAL | 4 refills | Status: DC

## 2023-01-20 NOTE — Progress Notes (Signed)
Patient ID: Paige [name], female   DOB: [birth year], 72 y.o.   MRN: [id number] Patient ID: Paige [name], female   DOB: 1950-12-03, 72 y.o.   MRN: [id number] [facility] MD Progress Note  01/20/2023 4:37 PM Paige December Musil   Pa time.st Medical History:   MRN:  [id number] Diagnosis major depressi    Today the patient is seen in the office with her son Paige [name].  Once again the patient is seen in very much distress.  She reports hearing voices regularly.  Unfortunately she did not response to the voices as they tell her to do certain things.  They are demanding voices of men and women and she hears them regularly.  The patient is on some type of bladder spasm medicine which I have asked him to discontinue.  The patient has been taking her Latuda 40 mg but takes it at breakfast.  Patient does take Lexapro 20 mg.  Patient does not appear depressed.  She is sleeping okay she is eating fairly well.  She does all her basic ADLs.  I am somewhat concerned about her institutional ADLs in terms of how she gets her food.  Her grandson lives with her at night and apparently helps her in the morning.  The patient does not appear distressed at all but she appears quite psychotic.  She has an upcoming neuropsych test in the next few weeks which I would be very helpful.  I think her Abilify and her psychotic and very afflicted.  I think it is most likely this patient needs to be receiving 24-hour care or possibly soon to an assisted living home setting.  I have asked her son who has power of attorney and he will clarify that with me.  I could not vision the patient tried probably looking at assisted living homes.  I think some of the symptomatology might have been explained in the past by urinary tract symptoms and her illness.  At this time she seems to be free of urinary symptoms.  I think it is reasonable to get another bladder study. At this time the patient i Past Medical History:  Diagnosis Date   ADD (attention  deficit disorder)    Pt has hard time focusing   Anxiety    on meds   Arthritis    Carpal tunnel syndrome    Colon polyp    Constipation    Depression    Gallstones    GERD (gastroesophageal reflux disease)    on meds   Hyperlipidemia    on meds   Hypertension    on meds   Status post dilation of esophageal narrowing    Thyroid disease    on meds    Past Surgical History:  Procedure Laterality Date   ABDOMINAL HYSTERECTOMY     CARPAL TUNNEL RELEASE Bilateral    CHOLECYSTECTOMY     CLOSED REDUCTION HAND FRACTURE     right hand   COLONOSCOPY  2018   JP-MAC-suprep(exc)-int hems-5 yr recall-   COLONOSCOPY  2013   TA   fx leg     NASAL SINUS SURGERY     POLYPECTOMY  2013   TA   SHOULDER SURGERY     reattac tendon; right shoulder   TIBIA FRACTURE SURGERY     steel rod right leg   TUBAL LIGATION     Family History:  Family History  Problem Relation Age of Onset   Colon polyps Mother 75   Hyperlipidemia Mother  Colon cancer Mother 12   Lung cancer Father    Leukemia Maternal Aunt    Cancer Paternal Grandfather        mets   Esophageal cancer Neg Hx    Rectal cancer Neg Hx    Stomach cancer Neg Hx    Breast cancer Neg Hx    Family Psychiatric  History:  Social History:  Social History   Substance and Sexual Activity  Alcohol Use No     Social History   Substance and Sexual Activity  Drug Use No    Social History   Socioeconomic History   Marital status: Widowed    Spouse name: Not on file   Number of children: 2   Years of education: Not on file   Highest education level: High school graduate  Occupational History   Occupation: retired    Associate Professor: UNEMPLOYED   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   Are you right handed or left handed?    Are you currently employed ? Yes    What is your current occupation? at [employer]      Do you live at home alone? YEs   Who lives with you?    What type of home do you live in: 1 story or 2 story? 1       Social Determinants of Health   Financial Resource Strain: Low Risk  (02/10/2022)   Overall Financial Resource Strain (CARDIA)    Difficulty of Paying Living Expenses: Not very hard  Food Insecurity: No Food Insecurity (02/10/2022)   Hunger Vital Sign    Worried About Running Out of Food in the Last Year: Never true    Ran Out of Food in the Last Year: Never true  Transportation Needs: No Transportation Needs (02/10/2022)   PRAPARE - Administrator, Civil Service (Medical): No    Lack of Transportation (Non-Medical): No  Physical Activity: Insufficiently Active (02/10/2022)   Exercise Vital Sign    Days of Exercise per Week: 2 days    Minutes of Exercise per Session: 30 min  Stress: No Stress Concern Present (02/10/2022)   [employer] of Occupational Health - Occupational Stress Questionnaire    Feeling of Stress : Only a little  Social Connections: Socially Isolated (02/10/2022)   Social Connection and Isolation Panel [NHANES]    Frequency of Communication with Friends and Family: Twice a week    Frequency of Social Gatherings with Friends and Family: Twice a week    Attends Religious Services: Never    Database administrator or Organizations: No    Attends Banker Meetings: Never    Marital Status: Widowed   Additional Social History:                         Sleep: Fair  Appetite:  Good  Current Medications: Current Outpatient Medications  Medication Sig Dispense Refill   bisoprolol-hydrochlorothiazide (ZIAC) 5-6.25 MG  tablet  Take 1 tablet by mouth once daily 90 tablet 0   Eszopiclone 3 MG TABS Take 1 tablet (3 mg total) by mouth at bedtime as needed. Take immediately before bedtime 30 tablet 5   levothyroxine (SYNTHROID) 112 MCG tablet Take 1 tablet by mouth once daily 90 tablet 0   pravastatin (PRAVACHOL) 20 MG tablet Take 1 tablet by mouth once daily 90 tablet 0   Vibegron 75 MG TABS Take 1 tablet (75 mg total) by mouth daily. 30 tablet 5   escitalopram (LEXAPRO) 20 MG tablet Take 1 tablet (20 mg total) by mouth daily. 30 tablet 7   lurasidone (LATUDA) 80 MG TABS tablet Take 0.5 tablets (40 mg total) by mouth daily with breakfast. 30 tablet 4   No current facility-administered medications for this visit.    Lab Results: No results found for this or any previous visit (from the past 48 hour(s)).  Blood Alcohol level:  No results found for: "ETH"  Physical Findings: AIMS:  , ,  ,  ,    CIWA:    COWS:     Musculoskeletal: Strength & Muscle Tone: within normal limits Gait & Station: normal Patient leans: N/A  Psychiatric Specialty Exam: ROS  Blood pressure 132/74, pulse 69, height 5\' 2"  (1.575 m), weight 183 lb (83 kg), SpO2 98 %.Body mass index is 33.47 kg/m.  General Appearance: Fairly Groomed  Patent attorney::  Good  Speech:  Clear and Coherent  Volume:  Normal  Mood:  Euthymic  Affect:  Blunt  Thought Process:  Coherent  Orientation:  Full (Time, Place, and Person)  Thought Content:  WDL  Suicidal Thoughts:  No  Homicidal Thoughts:  No  Memory:  NA  Judgement:  NA  Insight:  Good  Psychomotor Activity:  Decreased  Concentration:  Fair  Recall:  [employer] of Knowledge:Good  Language: Fair  Akathisia:  No  Handed:  Right  AIMS (if indicated):     Assets:  Desire for Improvement  ADL's:  Intact  Cognition: WNL  Sleep:      Treatment Plan Summary: 01/20/2023, 4:37 PM \  At this time it is apparent the patient seems to be persistently psychotic in terms of auditory and visual  hallucinations.  Today we will go over Latuda to 80 mg.  She will continue taking Lexapro as prescribed.  She will no longer take this him medicines which I suspect might be too anticholinergic and may be affecting her mental status.  She will have a neuropsych testing and she will see me again in approximately 1 month.  We will reevaluate her thoroughly on her next visit.  According to the son the patient stays at home and does not try to leave the house.  Number of family members do seem to check in on her but I think it would be reasonable to have more of a family meeting at her next visit.

## 2023-01-21 ENCOUNTER — Other Ambulatory Visit (HOSPITAL_COMMUNITY): Payer: Self-pay | Admitting: Psychiatry

## 2023-01-21 MED ORDER — ESCITALOPRAM OXALATE 20 MG PO TABS: ORAL_TABLET | ORAL | 3 refills | Status: DC

## 2023-01-21 MED ORDER — ESCITALOPRAM OXALATE 20 MG PO TABS: 20.0000 mg | ORAL_TABLET | Freq: Every day | ORAL | 7 refills | Status: DC

## 2023-01-21 MED ORDER — ESCITALOPRAM OXALATE 20 MG PO TABS: 20.0000 mg | ORAL_TABLET | Freq: Every day | ORAL | 2 refills | Status: DC

## 2023-01-26 NOTE — Progress Notes (Unsigned)
[name]  SUBJECTIVE  History of Present Illness: Paige [name] is a 72 y.o. female seen in follow-up for OAB. Plan at last visit was start Gemtesa 75mg  daily.   She has been taking the Gemtesa 75mg  daily but at some point reportedly got confused and was taking it twice daily for almost a week before her son noticed and stopped her.    Past Medical History: Patient  has a past medical history of AK (actinic keratosis) (10/19/2012), Attention deficit hyperactivity disorder (ADHD) (06/08/2007), Auditory hallucinations (09/2022), Back pain with radiculopathy (05/28/2009), Basal cell carcinoma (BCC) of back (11/16/2009), Carpal tunnel syndrome, Cellulitis and abscess of trunk (10/11/2021), Chronic bilateral low back pain without sciatica (07/30/2017), Closed fracture of nasal bones (02/13/2019), Colon polyp, Constipation, Deviated septum (02/21/2019), Essential hypertension (06/08/2007), Gallstones, Generalized anxiety disorder (07/30/2009), GERD (gastroesophageal reflux disease), Hyperlipidemia, Hypothyroidism (06/08/2007), Left hip pain (03/27/2016), Left wrist pain (12/10/2020), Lumbar radiculopathy (03/19/2016), Major depressive disorder (12/04/2022), Meningioma (07/31/2022), Neck pain (01/16/2016), Osteoarthritis (12/20/2007), Overactive bladder (10/05/2020), Pain in joint of left knee (08/09/2020), Pain of right thumb (09/22/2019), Piriformis syndrome of left side (04/29/2016), Prediabetes (07/05/2018), Pseudogout of hand, left (04/29/2022), Pseudogout of left wrist (01/21/2021), Right shoulder pain (02/14/2019), Status post dilation of esophageal narrowing, Thrombosed hemorrhoids (10/17/2008), and Visual hallucinations (09/2022).   Past Surgical History: She  has a past surgical history that includes Cholecystectomy; Abdominal hysterectomy; Tubal ligation; fx leg; Nasal sinus surgery; Closed reduction hand fracture; Carpal tunnel release (Bilateral); Tibia fracture  surgery; Shoulder surgery; Colonoscopy (2018); Colonoscopy (2013); and Polypectomy (2013).   Medications: She has a current medication list which includes the following prescription(s): bisoprolol-hydrochlorothiazide, escitalopram, escitalopram, escitalopram, eszopiclone, levothyroxine, lurasidone, pravastatin, and vibegron.   Allergies: Patient is allergic to asa [aspirin], augmentin [amoxicillin-pot clavulanate], crestor [rosuvastatin], and morphine and codeine.   Social History: Patient  reports that she has never smoked. She has never used smokeless tobacco. She reports that she does not drink alcohol and does not use drugs.      OBJECTIVE     Physical Exam: Vitals:   01/27/23 1513  BP: 104/67  Pulse: 67   Gen: No apparent distress, A&O x 3.  Detailed Urogynecologic Evaluation:  Deferred.    ASSESSMENT AND PLAN    Paige [name] is a 72 y.o. with:  1. Overactive bladder    Encouraged patient to continue use of Gemtesa 75mg  once daily. Her son is helping her with her pill box to make sure she is taking it once per day. Message sent to Psychiatrist to inquire why he would prefer she discontinue.   Patient to f/u in 6 months or sooner if needed.

## 2023-01-27 ENCOUNTER — Ambulatory Visit: Payer: PPO | Admitting: Psychology

## 2023-01-27 ENCOUNTER — Encounter: Payer: Self-pay | Admitting: Psychology

## 2023-01-27 ENCOUNTER — Encounter: Payer: Self-pay | Admitting: Obstetrics and Gynecology

## 2023-01-27 ENCOUNTER — Ambulatory Visit (INDEPENDENT_AMBULATORY_CARE_PROVIDER_SITE_OTHER): Payer: PPO | Admitting: Obstetrics and Gynecology

## 2023-01-27 ENCOUNTER — Ambulatory Visit: Payer: PPO

## 2023-01-27 VITALS — BP 104/67 | HR 67

## 2023-01-27 DIAGNOSIS — D329 Benign neoplasm of meninges, unspecified: Secondary | ICD-10-CM

## 2023-01-27 DIAGNOSIS — R4189 Other symptoms and signs involving cognitive functions and awareness: Secondary | ICD-10-CM

## 2023-01-27 DIAGNOSIS — R440 Auditory hallucinations: Secondary | ICD-10-CM | POA: Diagnosis not present

## 2023-01-27 DIAGNOSIS — F03B2 Unspecified dementia, moderate, with psychotic disturbance: Secondary | ICD-10-CM | POA: Diagnosis not present

## 2023-01-27 DIAGNOSIS — N3281 Overactive bladder: Secondary | ICD-10-CM | POA: Diagnosis not present

## 2023-01-27 DIAGNOSIS — F0390 Unspecified dementia without behavioral disturbance: Secondary | ICD-10-CM

## 2023-01-27 HISTORY — DX: Unspecified dementia, unspecified severity, without behavioral disturbance, psychotic disturbance, mood disturbance, and anxiety: F03.90

## 2023-01-27 NOTE — Progress Notes (Unsigned)
NEUROPSYCHOLOGICAL EVALUATION [facility] Department of Neurology  Date of Evaluation: January 27, 2023  Reason for Referral:   Paige [name] is a 72 y.o. right-handed Caucasian female referred by Shon Millet, D.O., to characterize her current cognitive functioning and assist with diagnostic clarity and treatment planning in the context of subjective cognitive decline.   Assessment and Plan:   Clinical Impression(s): Paige [name] pattern of performance is suggestive of diffuse cognitive dysfunction. Adequate performances were exhibited across a single task of basic attention, as well as a single task assessing basic semantic knowledge. Some variability was also exhibited across recognition aspects of memory. However, all other assessed cognitive domains exhibited very severe cognitive impairment. This includes processing speed, cognitive flexibility, receptive and expressive language, visuospatial abilities, and both encoding (i.e., learning) and delayed retrieval aspects of memory. Functionally, Paige [name]'s family has been assisting with medication and financial management only since their reported ongoing of psychotic symptoms in mid-February. She continues to drive currently and and prior to mid-February, no functional concerns were noted. Given the extent of severe impairment, the latter remains quite surprising to me. At the present time, I feel she best meets diagnostic criteria for a Major Neurocognitive Disorder ("dementia") at the present time.  The etiology for ongoing impairment remains unclear given diffuse, severe dysfunction and there are many underlying causes which warrant further consideration. Firstly, Paige [name] was treated for a UTI this past April and, based on medical records, exhibited symptoms prior to her seeking treatment. Hallucinations and delirious presentations can arise in the elderly as accompanying symptoms of a UTI. In more  rare cases, symptoms can persist weeks to months following adequate treatment. Additionally, Paige [name] reported a longstanding history of mood concerns, as well as acute symptoms of moderate depression and anxiety. There remains the possibility that active hallucinations can be primarily attributed to these medical/psychiatric comorbidities. While I do not believe that these variables fully explain severe cognitive impairment, they remain potential contributing variables worth monitoring as time progresses.  Neurologically speaking, I feel that there is a very good likelihood of an underlying neurodegenerative condition being present. However, narrowing this down is quite challenging due to diffuse impairment and likely clouded by longstanding and currently active psychiatric distress. Paige [name]'s daughter reported expressive language dysfunction which pre-dated the evolution of psychotic symptoms mid-February, likely progressively worsening for the past year or so. Initial expressive language dysfunction can raise concerns for a primary progressive aphasia (PPA) presentation. Of these subtypes, Paige [name] perhaps most aligns with the logopenic presentation given testing patterns. It is worth highlighting that logopenic PPA presentations share pathology with Alzheimer's disease to the extent that it is sometimes referred to as a language variant of that illness. While her testing patterns do not strongly align with traditionally presenting Alzheimer's disease given some modest retention and benefit from cueing across memory testing, an atypical language variant of Alzheimer's disease (i.e., logopenic PPA presentation) certainly remains a possibility. While psychotic symptoms (i.e., hallucinations) are extremely uncommon in PPA presentations, they can certainly be an accompanying feature of Alzheimer's disease.   In addition to primarily auditory hallucinations, Paige [name] did report experiencing  fully-formed visual hallucinations. She also expressed some potential sleep behaviors as she described falling out of bed several times over the past several months. If these are due to physical movements while asleep and not Paige [name] simply laying near the edge of the bed, these two symptoms could raise concern for Lewy body disease.  However, she did not describe consistent gait abnormalities or tremors and the rate of cognitive decline would have to be exceptionally quick as cognitive decline often coincides with the emergence of psychotic symptoms in Lewy body cases. If her timeline normal functioning to severe impairment over the past four months (mid-February) is truly accurate, then this rapid of a decline coupled with psychotic symptoms could be concerning for a prior disease. However, prion diseases are exceptionally rare (i.e., 1-2 people per million worldwide) and my suspicion is that cognitive impairment has been present and simply not directly observed for a far lengthier period of time.   The presence of her very small (14 mm) right frontal meningioma certainly would not account for the extent and severity of ongoing impairment and this as the cause of her current presentation is extremely unlikely. Her more recent brain scans did not reveal any prior strokes or vascular abnormalities which would warrant this degree of impairment.     Recommendations: ***  A repeat neuropsychological evaluation in 12-18 months (or sooner if functional decline is noted) is recommended to assess the trajectory of future cognitive decline should it occur. This will also aid in future efforts towards improved diagnostic clarity.  Paige [name] has already been prescribed a medication aimed to address memory loss and concerns surrounding Alzheimer's disease (i.e., ***). she is encouraged to continue taking this medication as prescribed. It is important to highlight that this medication has been shown to slow  functional decline in some individuals. There is no current treatment which can stop or reverse cognitive decline when caused by a neurodegenerative illness.   A combination of medication and psychotherapy has been shown to be most effective at treating symptoms of anxiety and depression. As such, Paige [name] is encouraged to speak with her prescribing physician regarding medication adjustments to optimally manage these symptoms. Likewise, Paige [name] is encouraged to consider engaging in short-term psychotherapy to address symptoms of psychiatric distress. she would benefit from an active and collaborative therapeutic environment, rather than one purely supportive in nature. Recommended treatment modalities include Cognitive Behavioral Therapy (CBT) or Acceptance and Commitment Therapy (ACT).  Performance across neurocognitive testing is not a strong predictor of an individual's safety operating a motor vehicle. Should her family wish to pursue a formalized driving evaluation, they could reach out to the following agencies: [employer] in [place]: [phone] [facility]: [phone] [facility]: [phone] [facility]: [phone] or [phone]  Should there be progression of current deficits over time, Paige [name] is unlikely to regain any independent living skills lost. Therefore, it is recommended that she remain as involved as possible in all aspects of household chores, finances, and medication management, with supervision to ensure adequate performance. she will likely benefit from the establishment and maintenance of a routine in order to maximize her functional abilities over time.  It will be important for Paige [name] to have another person with her when in situations where she may need to process information, weigh the pros and cons of different options, and make decisions, in order to ensure that she fully understands and  recalls all information to be considered.  If not already done, Paige [name] and her family may want to discuss her wishes regarding durable power of attorney and medical decision making, so that she can have input into these choices. If they require legal assistance with this, long-term care resource access, or other aspects of estate planning, they could reach out to [facility] at [phone] for a free consultation. Additionally, they may wish to discuss future plans for caretaking and seek out community options for in home/residential care should they become necessary.  Paige [name] is encouraged to attend to lifestyle factors for brain health (e.g., regular physical exercise, good nutrition habits and consideration of the MIND-DASH diet, regular participation in cognitively-stimulating activities, and general stress management techniques), which are likely to have benefits for both emotional adjustment and cognition. In fact, in addition to promoting good general health, regular exercise incorporating aerobic activities (e.g., brisk walking, jogging, cycling, etc.) has been demonstrated to be a very effective treatment for depression and stress, with similar efficacy rates to both antidepressant medication and psychotherapy.   Optimal control of vascular risk factors (including safe cardiovascular exercise and adherence to dietary recommendations) is encouraged. Likewise, continued compliance with her CPAP machine will also be important. ***  Continued participation in activities which provide mental stimulation and social interaction is also recommended.   If interested, there are some activities which have therapeutic value and can be useful in keeping her cognitively stimulated. For suggestions, Paige [name] is encouraged to go to the following website: https://www.[facility].org/get-to-know-barrow/centers-programs/neurorehabilitation-center/neuro-rehab-apps-and-games/ which has  options, categorized by level of difficulty. It should be noted that these activities should not be viewed as a substitute for therapy.  Important information should be provided to Paige [name] in written format in all instances. This information should be placed in a highly frequented and easily visible location within her home to promote recall. External strategies such as written notes in a consistently used memory journal, visual and nonverbal auditory cues such as a calendar on the refrigerator or appointments with alarm, such as on a cell phone, can also help maximize recall.  Memory can be improved using internal strategies such as rehearsal, repetition, chunking, mnemonics, association, and imagery. External strategies such as written notes in a consistently used memory journal, visual and nonverbal auditory cues such as a calendar on the refrigerator or appointments with alarm, such as on a cell phone, can also help maximize recall.    When learning new information, she would benefit from information being broken up into small, manageable pieces. she may also find it helpful to articulate the material in her own words and in a context to promote encoding at the onset of a new task. This material may need to be repeated multiple times to promote encoding.  Because she shows better recall for structured information, she will likely understand and retain new information better if it is presented to her in a meaningful or well-organized manner at the outset, such as grouping items into meaningful categories or presenting information in an outlined, bulleted, or story format.  To address problems with processing speed, she may wish to consider:   -Ensuring that she is alerted when essential material or instructions are being presented   -Adjusting the speed at which new information is presented   -Allowing for more time in comprehending, processing, and responding in conversation   -Repeating and  paraphrasing instructions or conversations aloud  To address problems with fluctuating attention and/or executive dysfunction, she may wish to consider:   -Avoiding external distractions when needing to concentrate   -Limiting exposure to fast paced environments with multiple sensory demands   -Writing down complicated information and using checklists   -Attempting and completing one task at a time (i.e., no multi-tasking)   -Verbalizing aloud each step of a task to maintain focus   -Taking frequent breaks  during the completion of steps/tasks to avoid fatigue   -Reducing the amount of information considered at one time   -Scheduling more difficult activities for a time of day where she is usually most alert  {[email or web address]}  Review of Records:   Paige [name]. Canet was seen by [facility] Shon Millet, D.O.) on 09/09/2022 for an evaluation of ongoing aphasia. At that time, word finding and other expressive language concerns were said to be prominent, ongoing for the past year or so. She noted being unable to get words out effectively, as well as instances where she is unable to understand others who are speaking to her. There may be a hearing component to this as there is ongoing hearing loss (e.g., sometimes she cannot hear people knocking on the door). Performance on a brief cognitive screening instrument (SLUMS) was 13/30. Ultimately, Paige [name]. Petrella was referred for a comprehensive neuropsychological evaluation to characterize her cognitive abilities and to assist with diagnostic clarity and treatment planning.   She was most recently seen by her psychiatrist Archer Asa, M.D.) on 01/20/2023. She reported ongoing auditory hallucinations which have been ongoing for the past several months. Voices are unfamiliar men and women who are sometimes demanding. She does not act upon their demands. Some symptoms may have been exacerbated by a recent UTI experienced this past April. Medications were  adjusted.   Head CT on 02/10/2019 was negative. Brain MRI on 07/30/2022 revealed mildly advanced atrophy (no comment was made on if this was in a generalized pattern or not) and microvascular ischemic disease. A 14 mm meningioma over the right frontal lobe was so observed and described as an incidental finding. Head CT on 11/26/2022 was negative for any acute intracranial abnormalities.   Past Medical History:  Diagnosis Date   AK (actinic keratosis) 10/19/2012   Left shoulder   Attention deficit hyperactivity disorder (ADHD) 06/08/2007   Qualifier: Diagnosis of   By: Mayford Knife, LPN, Domenic Polite  Has ADD and this has efected her ability to focus and do her job   Auditory hallucinations 09/2022   Back pain with radiculopathy 05/28/2009   Basal cell carcinoma (BCC) of back 11/16/2009   Carpal tunnel syndrome    Cellulitis and abscess of trunk 10/11/2021   Chronic bilateral low back pain without sciatica 07/30/2017   Closed fracture of nasal bones 02/13/2019   Colon polyp    Constipation    Deviated septum 02/21/2019   Essential hypertension 06/08/2007   Gallstones    Generalized anxiety disorder 07/30/2009   GERD (gastroesophageal reflux disease)    Hyperlipidemia    Hypothyroidism 06/08/2007   S/p RAI treatment   Left hip pain 03/27/2016   Left wrist pain 12/10/2020   Lumbar radiculopathy 03/19/2016   Major depressive disorder 12/04/2022   Meningioma 07/31/2022   14 mm right frontal lobe -seen on MRI 07/2022   Neck pain 01/16/2016   Osteoarthritis 12/20/2007   Overactive bladder 10/05/2020   Pain in joint of left knee 08/09/2020   Pain of right thumb 09/22/2019   Piriformis syndrome of left side 04/29/2016   Prediabetes 07/05/2018   Pseudogout of hand, left 04/29/2022   Pseudogout of left wrist 01/21/2021   Right shoulder pain 02/14/2019   Status post dilation of esophageal narrowing    Thrombosed hemorrhoids 10/17/2008   Visual hallucinations 09/2022   fully-formed    Past  Surgical History:  Procedure Laterality Date   ABDOMINAL HYSTERECTOMY     CARPAL TUNNEL RELEASE Bilateral    CHOLECYSTECTOMY  CLOSED REDUCTION HAND FRACTURE     right hand   COLONOSCOPY  2018   JP-MAC-suprep(exc)-int hems-5 yr recall-   COLONOSCOPY  2013   TA   fx leg     NASAL SINUS SURGERY     POLYPECTOMY  2013   TA   SHOULDER SURGERY     reattac tendon; right shoulder   TIBIA FRACTURE SURGERY     steel rod right leg   TUBAL LIGATION      Current Outpatient Medications:    bisoprolol-hydrochlorothiazide (ZIAC) 5-6.25 MG tablet, Take 1 tablet by mouth once daily, Disp: 90 tablet, Rfl: 0   escitalopram (LEXAPRO) 20 MG tablet, 1  qam, Disp: 30 tablet, Rfl: 3   escitalopram (LEXAPRO) 20 MG tablet, Take 1 tablet (20 mg total) by mouth daily., Disp: 30 tablet, Rfl: 2   escitalopram (LEXAPRO) 20 MG tablet, Take 1 tablet (20 mg total) by mouth daily., Disp: 30 tablet, Rfl: 7   Eszopiclone 3 MG TABS, Take 1 tablet (3 mg total) by mouth at bedtime as needed. Take immediately before bedtime, Disp: 30 tablet, Rfl: 5   levothyroxine (SYNTHROID) 112 MCG tablet, Take 1 tablet by mouth once daily, Disp: 90 tablet, Rfl: 0   lurasidone (LATUDA) 80 MG TABS tablet, Take 0.5 tablets (40 mg total) by mouth daily with breakfast., Disp: 30 tablet, Rfl: 4   pravastatin (PRAVACHOL) 20 MG tablet, Take 1 tablet by mouth once daily, Disp: 90 tablet, Rfl: 0   Vibegron 75 MG TABS, Take 1 tablet (75 mg total) by mouth daily., Disp: 30 tablet, Rfl: 5  Clinical Interview:   The following information was obtained during a clinical interview with Paige [name]. [place] and her daughter prior to cognitive testing.  Cognitive Symptoms: Decreased short-term memory: Endorsed. She reported ongoing trouble recalling recent conversations and names of familiar individuals. She was unclear of how long these difficulties were present or if she was experiencing any other consistent difficulties. Her daughter noted that memory  concerns have been present since mid-February, coinciding with the onset of psychiatric symptoms (i.e., hallucinations). There were also concerns for a UTI this past April. Prior to mid-February, her daughter did not describe prominent family-held memory concerns.  Decreased long-term memory: Denied. Decreased attention/concentration: Endorsed. She reported "sometimes" having trouble with sustained attention and increased distractibility. Medical records suggest a prior ADHD diagnosis, with her likely diagnosed as an adult. Reduced processing speed: Endorsed. Difficulties with executive functions: Endorsed. She primarily reported trouble with organization, multi-tasking, and decision making. She and her daughter denied trouble with impulsivity or any significant personality changes.  Difficulties with emotion regulation: Denied. Difficulties with receptive language: Endorsed. However, it remains unclear if trouble with comprehension is more related to uncorrected hearing loss.  Difficulties with word finding: Endorsed. She described prominent word finding difficulties and commonly experiencing tip-of-the-tongue phenomenons. Her daughter was in agreement and noted that these symptoms pre-date the onset of psychotic symptoms in mid-February and seem to have gradually worsened over the past year or so.  Decreased visuoperceptual ability: Denied.  Difficulties completing ADLs: Somewhat. ADL dysfunction appears to be more acute in response to the emergence of psychotic symptoms. Her son provides assistance with pillbox organization. Paige [name]. Schwall is generally able to take her medications adequately. Her daughter did express some concern that she may have mixed up medications within the past few months. Her daughter has taken over financial management since mid-February. Despite this, she noted that Paige [name]. [place]jano is still very much aware of when bills  are due and when money is scheduled to come into her account.  She no longer drives. This was precautionary due to family held concerns surrounding cognition since mid-February. Prior to this, no concerns were noted.   Additional Medical History: History of traumatic brain injury/concussion: Denied.  History of stroke: Denied. History of seizure activity: Denied. History of known exposure to toxins: Denied. Symptoms of chronic pain: Endorsed. Medical records suggest chronic back pain and symptoms of fairly diffuse arthritis.  Experience of frequent headaches/migraines: Denied. She reported "sometimes" experiencing headache symptoms but noted that these occur likely only a few times per month.  Frequent instances of dizziness/vertigo: Denied. She also denied any consistent nausea/vomiting experiences.   Sensory changes: She wears glasses with some benefit but noted some ongoing visual acuity difficulties at times. She is hard of hearing and this appears uncorrected. She also reported a variably diminished sense of smell.  Balance/coordination difficulties: She reported sudden instances of balance instability. Weakness and/or greater instability is generally found on the left side of the body. The cause for these experiences was unknown.  Other motor difficulties: Her daughter described a very mild lip tremor from time to time.   Other medical conditions: As stated above, a fairly recent brain MRI revealed a very small (14 mm) meningioma located with the right frontal lobe. This was described as an incidental finding at the time. Her daughter wondered if this could explain an increase in psychotic symptoms.   Sleep History: Estimated hours obtained each night: Unclear. She described varying levels of sleep.  Difficulties falling asleep: Endorsed. Difficulties staying asleep: Endorsed. Feels rested and refreshed upon awakening: Variably so depending on the quantity and quality of sleep she was able to get the night before.   History of snoring:  Denied. History of waking up gasping for air: Denied. Witnessed breath cessation while asleep: Denied.  History of vivid dreaming: Denied. Excessive movement while asleep: Unclear. She noted that she had fallen out of bed several times over the past few months. She was unable to determine is she was engaged in active hitting/kicking/fighting behaviors. As she sleeps alone, her daughter was unable to verify the presence of any abnormal physical movements.  Instances of acting out her dreams: Unclear (see above).   Psychiatric/Behavioral Health History: Depression: She acknowledged a longstanding history of depressive symptoms over the course of many years. Medical records suggest a history of severe symptoms at times, including possible psychotic symptoms. Medications have been somewhat helpful at managing symptoms over time. She described her current mood as "not good." Acute changes were primarily attributed to the presence of hallucinations, as well as her perception of general cognitive and functional decline over time. Current medications were said to be somewhat helpful with mood-related concerns. Current or remote suicidal ideation, intent, or plan was denied.  Anxiety: Endorsed. Medical records also suggest a history of generalized anxious distress.  Mania: Denied. Trauma History: Denied. Visual/auditory hallucinations: Endorsed. Her daughter noted that the family first became aware of auditory hallucinations in mid-February and are unaware if these symptoms had been present for a longer duration of time. Her daughter noted that her mother can be commonly observed talking to people in the television or the vents in her home as examples. Paige [name]. Delaporte noted that she hears several different voices, all from unknown individuals. They commonly demand her to perform certain actions which she does not do. Medications have been unhelpful in addressing these symptoms and they continue to be present often  and impact her day-to-day functioning. While auditory hallucinations are more prevalent, she also described experiencing fully-formed visual hallucinations regularly.  Delusional thoughts: Denied.  Tobacco: Denied. Alcohol: She denied current alcohol consumption as well as a history of problematic alcohol abuse or dependence.  Recreational drugs: Denied.  Family History: Problem Relation Age of Onset   Colon polyps Mother 31   Hyperlipidemia Mother    Colon cancer Mother 21   Lung cancer Father    Mental illness Father    Cancer Paternal Grandfather        mets   Leukemia Maternal Aunt    Esophageal cancer Neg Hx    Rectal cancer Neg Hx    Stomach cancer Neg Hx    Breast cancer Neg Hx    This information was confirmed by Paige [name]. Mifflin.  Academic/Vocational History: Highest level of educational attainment: 12 years. She graduated from high school and described herself as a good (A/B) student in academic settings. No relative weaknesses were identified.  History of developmental delay: Denied. History of grade repetition: Denied. Enrollment in special education courses: Denied. History of LD/ADHD: Denied.  Employment: Retired.   Evaluation Results:   Behavioral Observations: Paige [name]. Elsner was accompanied by her daughter, arrived to her appointment on time, and was appropriately dressed and groomed. She appeared alert. Observed gait and station were within normal limits. Gross motor functioning appeared intact upon informal observation and no abnormal movements (e.g., tremors) were noted. Her affect was generally relaxed and positive. Spontaneous speech was effortful at times and some word finding difficulties were observed. However, she was generally able to communicate her thoughts adequately. There were instances where she did not appear to comprehend what was being said to her. It was unclear if this was more comprehension or hearing loss based. Thought processes were tangential  at times, potentially impacted by comprehension versus hearing difficulties. Insight into her cognitive difficulties appeared fairly limited. She commonly appeared unsure when answering questions regarding ongoing difficulties, generally providing a hesitant "sometimes" as a fall back response.   During testing, sustained attention was largely adequate. There were instances where speech was a more more effortful. There were also instances where she had an abrupt change in her demeanor, following her her admission that she was dealing with auditory hallucinations (i.e., "I just heard one of my people" or "I hear another person"). Said hallucinations were said to be distracting regarding her ability to perform. Broadly, task engagement was adequate and she persisted when challenged. She did fatigue as the evaluation progressed and it was abbreviated in response. Overall, Paige [name]. Krason was cooperative with the clinical interview and subsequent testing procedures. Of note, it was arranged that Paige [name]. Turknett would call for her ride towards the conclusion of her evaluation as her daughter had to return to work. When this time came. Paige [name]. Kreiger was unable to operate her phone or carry out this plan. The psychometrist was required to call her daughter who could then relay information to her ride.   Adequacy of Effort: The validity of neuropsychological testing is limited by the extent to which the individual being tested may be assumed to have exerted adequate effort during testing. Paige [name]. Tays expressed her intention to perform to the best of her abilities and exhibited adequate task engagement and persistence. Scores across stand-alone and embedded performance validity measures were variable. However, her sole below expectation performance is believed to be due to severe cognitive impairment rather than poor engagement or attempts to perform poorly. As such,  the results of the current evaluation are believed to  be a valid representation of Paige [name]. Klaus's current cognitive functioning.  Test Results: Paige [name]. Minkus was disoriented at the time of the current evaluation.She was unable to state her address, was unable to name the city she resided in, and was unable to provide her phone number. She was also unable to name the current clinic.   Intellectual abilities based upon educational and vocational attainment were estimated to be in the average range. Premorbid abilities were estimated to be within the below average range based upon a single-word reading test.   Processing speed was exceptionally low. Basic attention was below average. More complex attention (e.g., working memory) was unable to be assessed due to fatigue. Cognitive flexibility was exceptionally low. Other aspects of executive functioning were unable to be assessed.  Assessed receptive language abilities were exceptionally low. She exhibited difficulties across all aspects of this task, including more basic commands (e.g., "point to red and then yellow"). Common difficulties were noted when asked to sequence commands, follow multi-step commands, and when attempting to understand more complex sentence structure. Assessed expressive language (e.g., verbal fluency, writing samples, sentence repetition, and confrontation naming) was exceptionally low. She did have an adequate performance across a basic semantic knowledge screening task.      Assessed visuospatial/visuoconstructional abilities were exceptionally low. When drawing her clock, she included numbers 13, 14, and 15, as well as included numbers 11 and 12 twice. She also exhibited confusion and was unable to place the clock hands accurately. Across her copy of a complex figure, she did not include the bottom horizontal line, leaving the outer rectangle incomplete. She also omitted many other aspects of the figure. When asked to draw this figure from memory, she instead attempt to re-create  her prior drawing of a clock which also had poor numerical spacing, number duplications, and only a single drawn clock hand.    Learning (i.e., encoding) of novel verbal information was exceptionally low. Spontaneous delayed recall (i.e., retrieval) of previously learned information was exceptionally low to well below average. Retention rates were 100% (raw score of 3) across a story learning task, 50% (raw score of 1) across a list learning task, and 0% across a figure drawing task. Performance across recognition tasks was well below average to below average, suggesting some limited evidence for information consolidation.   Results of emotional screening instruments suggested that recent symptoms of generalized anxiety were in the moderate range, while symptoms of depression were also within the moderate range. A screening instrument assessing recent sleep quality suggested the presence of minimal sleep dysfunction.  Tables of Scores:   Note: This summary of test scores accompanies the interpretive report and should not be considered in isolation without reference to the appropriate sections in the text. Descriptors are based on appropriate normative data and may be adjusted based on clinical judgment. Terms such as "Within Normal Limits" and "Outside Normal Limits" are used when a more specific description of the test score cannot be determined.       Percentile - Normative Descriptor > 98 - Exceptionally High 91-97 - Well Above Average 75-90 - Above Average 25-74 - Average 9-24 - Below Average 2-8 - Well Below Average < 2 - Exceptionally Low       Validity:   DESCRIPTOR       Dot Counting Test: --- --- Outside Normal Limits  RBANS Effort Index: --- --- Within Normal Limits       Orientation:  Raw Score Percentile   NAB Orientation, Form 1 23/29 --- ---       Cognitive Screening:      Raw Score Percentile   SLUMS: 4/30 --- ---       RBANS, Form A: Standard Score/ Scaled Score  Percentile   Total Score 48 <1 Exceptionally Low  Immediate Memory 44 <1 Exceptionally Low    List Learning 1 <1 Exceptionally Low    Story Memory 2 <1 Exceptionally Low  Visuospatial/Constructional 58 <1 Exceptionally Low    Figure Copy 2 <1 Exceptionally Low    Line Orientation 7/20 <2 Exceptionally Low  Language 51 <1 Exceptionally Low    Picture Naming 6/10 <2 Exceptionally Low    Semantic Fluency 2 <1 Exceptionally Low  Attention 60 <1 Exceptionally Low    Digit Span 7 16 Below Average    Coding 1 <1 Exceptionally Low  Delayed Memory 56 <1 Exceptionally Low    List Recall 1/10 3-9 Well Below Average    List Recognition 17/20 10-16 Below Average    Story Recall 4 2 Well Below Average    Story Recognition 7/12 5-7 Well Below Average    Figure Recall 1 <1 Exceptionally Low    Figure Recognition 4/8 9-20 Below Average        Intellectual Functioning:      Standard Score Percentile   Test of Premorbid Functioning: 83 13 Below Average       Attention/Executive Function:     Trail Making Test (TMT): Raw Score (T Score) Percentile     Part A 121 secs.,  0 errors (19) <1 Exceptionally Low    Part B Discontinued --- Impaired        D-KEFS Verbal Fluency Test: Raw Score (Scaled Score) Percentile     Letter Total Correct 12 (3) 1 Exceptionally Low    Category Total Correct 13 (2) <1 Exceptionally Low    Category Switching Total Correct 3 (1) <1 Exceptionally Low    Category Switching Accuracy 2 (1) <1 Exceptionally Low      Total Set Loss Errors 5 (6) 9 Below Average      Total Repetition Errors 0 (13) 84 Above Average       Language:      Raw Score Percentile   PPVT Screening Instrument: 13/16 --- Within Normal Limits  Sentence Repetition: 10/22 <1 Exceptionally Low       Verbal Fluency Test: Raw Score (T Score) Percentile     Phonemic Fluency (FAS) 12 (22) <1 Exceptionally Low    Animal Fluency 8 (24) <1 Exceptionally Low        NAB Language Module, Form 1: T Score  Percentile     Auditory Comprehension 19 <1 Exceptionally Low    Naming 21/31 (19) <1 Exceptionally Low    Writing 19 <1 Exceptionally Low       Visuospatial/Visuoconstruction:      Raw Score Percentile   Clock Drawing: 5/10 --- Impaired       Mood and Personality:      Raw Score Percentile   PROMIS Depression Questionnaire: 28 --- Moderate  PROMIS Anxiety Questionnaire: 23 --- Moderate       Additional Questionnaires:      Raw Score Percentile   PROMIS Sleep Disturbance Questionnaire: 24 --- None to Slight   Informed Consent and Coding/Compliance:   The current evaluation represents a clinical evaluation for the purposes previously outlined by the referral source and is in no way reflective of  a forensic evaluation.   Paige [name]. Vasco was provided with a verbal description of the nature and purpose of the present neuropsychological evaluation. Also reviewed were the foreseeable risks and/or discomforts and benefits of the procedure, limits of confidentiality, and mandatory reporting requirements of this provider. The patient was given the opportunity to ask questions and receive answers about the evaluation. Oral consent to participate was provided by the patient.   This evaluation was conducted by Newman Nickels, Ph.D., ABPP-CN, board certified clinical neuropsychologist. Paige [name]. Banks completed a clinical interview with Dr. Milbert Coulter, billed as one unit [phone], and 140 minutes of cognitive testing and scoring, billed as one unit [phone] and four additional units [id number]. Psychometrist Shan Levans, B.S., assisted Dr. Milbert Coulter with test administration and scoring procedures. As a separate and discrete service, one unit [id number] and two units [phone] were billed for Dr. Tammy Sours time spent in interpretation and report writing.

## 2023-01-27 NOTE — Progress Notes (Signed)
   Psychometrician Note   Cognitive testing was administered to Paige [name] by Paige [name], B.S. (psychometrist) under the supervision of Paige [name], Ph.D., licensed psychologist on 01/27/2023. Paige [name] did not appear overtly distressed by the testing session per behavioral observation or responses across self-report questionnaires. Rest breaks were offered.    The battery of tests administered was selected by Paige [name], Ph.D. with consideration to Paige [name]'s current level of functioning, the nature of her symptoms, emotional and behavioral responses during interview, level of literacy, observed level of motivation/effort, and the nature of the referral question. This battery was communicated to the psychometrist. Communication between Paige [name], Ph.D. and the psychometrist was ongoing throughout the evaluation and Paige [name], Ph.D. was immediately accessible at all times. Paige [name], Ph.D. provided supervision to the psychometrist on the date of this service to the extent necessary to assure the quality of all services provided.    Paige [name] will return within approximately 1-2 weeks for an interactive feedback session with Paige [name] at which time her test performances, clinical impressions, and treatment recommendations will be reviewed in detail. Paige [name] understands she can contact our office should she require our assistance before this time.  A total of 140 minutes of billable time were spent face-to-face with Paige [name] by the psychometrist. This includes both test administration and scoring time. Billing for these services is reflected in the clinical report generated by Paige [name], Ph.D.  This note reflects time spent with the psychometrician and does not include test scores or any clinical interpretations made by Paige [name]. The full report will follow in a separate note.

## 2023-01-27 NOTE — Patient Instructions (Signed)
I will message her Psychiatrist regarding the Gemtesa. I will call if he sees an obvious reason to discontinue medication.

## 2023-01-28 ENCOUNTER — Encounter: Payer: Self-pay | Admitting: Psychology

## 2023-02-03 ENCOUNTER — Encounter: Payer: PPO | Admitting: Psychology

## 2023-02-04 ENCOUNTER — Ambulatory Visit (INDEPENDENT_AMBULATORY_CARE_PROVIDER_SITE_OTHER): Payer: PPO | Admitting: Psychology

## 2023-02-04 DIAGNOSIS — F03B2 Unspecified dementia, moderate, with psychotic disturbance: Secondary | ICD-10-CM | POA: Diagnosis not present

## 2023-02-04 NOTE — Progress Notes (Signed)
NEUROLOGY FOLLOW UP OFFICE NOTE  Paige [name] [id number]  Assessment/Plan:   Major neurocognitive disorder, unclear etiology.  Sudden onset of psychosis unusual for neurodegenerative disorder but possibly could have been triggered by UTI.  Neuropsychological evaluation nonspecific.  While further evaluation for specific neurodegenerative disease is needed (Alzhiemer's, Lewy Body), secondary etiologies such as autoimmune or paraneoplastic syndrome should be considered as well. Psychosis Right frontal lobe meningioma   Repeat MRI of brain this time with as well as without contrast EEG Further recommendations pending results.  Consider LP for CSF analysis  Total time spent reviewing chart and face to face with patient and family discussing potential diagnoses:  52 minutes  Subjective:  Paige [name] is a 72 year old right-handed female with ADD, HTN, HLD, anxiety and hypothyroidism who follows up for aphasia and new psychosis.  Psychiatry notes reviewed.  History supplemented by accompanying mother and son.  UPDATE: She has history of depression and is followed by her psychiatrist, Dr. Donell Beers.  In February, she began experiencing sudden onset visual and auditory hallucinations.  She started seeing unfamiliar people in the home talking to her, sometimes making demands.  One time, she insisted that somebody was up in her mother's attic.  Another time, her mother saw her talking to the table because she saw somebody sitting there.  She once was holding a blouse because she had to give it to a lady at the back door.  She has had a couple of falls but nothing significant.  Initially, she was found to have a UTI, but symptoms have persisted.  She underwent neuropsychological evaluation on 01/27/2023 which revealed diffuse cognitive dysfunction. Specific diagnosis was unclear.  Logopenic primary progressive aphasia possible but testing patterns not consistent with Alzheimer's disease and it  would not explain psychosis.  Lewy body disease considered.  Both her son and mother have denied noticing any signs of memory impairment or speech impairment.  She has never exhibited any psychosis in the past.   HISTORY: She started having trouble with getting words out in 2023.  Sometimes she cannot understand what other people are telling her.  When she writes, she is unsure if it is correct.  Sometimes she has trouble hearing, such as people knocking on the door.    TSH from 07/28/2022 was 1.85.  MRI of brain without contrast on 07/30/2022 personally reviewed revealed atrophy and mildly advanced chronic small vessel ischemic changes with a 14 mm calcified right frontal lobe meningioma.    Family history:  brother has  Bipolar disorder       PAST MEDICAL HISTORY: Past Medical History:  Diagnosis Date   AK (actinic keratosis) 10/19/2012   Left shoulder   Attention deficit hyperactivity disorder (ADHD) 06/08/2007   Qualifier: Diagnosis of   By: Mayford Knife, LPN, Domenic Polite   Auditory hallucinations 09/2022   Back pain with radiculopathy 05/28/2009   Basal cell carcinoma (BCC) of back 11/16/2009   Carpal tunnel syndrome    Cellulitis and abscess of trunk 10/11/2021   Chronic bilateral low back pain without sciatica 07/30/2017   Closed fracture of nasal bones 02/13/2019   Colon polyp    Constipation    Dementia, unclear and potentially mixed etiology 01/27/2023   Deviated septum 02/21/2019   Essential hypertension 06/08/2007   Gallstones    Generalized anxiety disorder 07/30/2009   GERD (gastroesophageal reflux disease)    Hyperlipidemia    Hypothyroidism 06/08/2007   S/p RAI treatment   Left hip pain 03/27/2016  Left wrist pain 12/10/2020   Lumbar radiculopathy 03/19/2016   Major depressive disorder 12/04/2022   Meningioma 07/31/2022   14 mm right frontal lobe -seen on MRI 07/2022   Neck pain 01/16/2016   Osteoarthritis 12/20/2007   Overactive bladder 10/05/2020   Pain in  joint of left knee 08/09/2020   Pain of right thumb 09/22/2019   Piriformis syndrome of left side 04/29/2016   Prediabetes 07/05/2018   Pseudogout of hand, left 04/29/2022   Pseudogout of left wrist 01/21/2021   Right shoulder pain 02/14/2019   Status post dilation of esophageal narrowing    Thrombosed hemorrhoids 10/17/2008   Visual hallucinations 09/2022   fully-formed    MEDICATIONS: Current Outpatient Medications on File Prior to Visit  Medication Sig Dispense Refill   bisoprolol-hydrochlorothiazide (ZIAC) 5-6.25 MG tablet Take 1 tablet by mouth once daily 90 tablet 0   escitalopram (LEXAPRO) 20 MG tablet 1  qam 30 tablet 3   escitalopram (LEXAPRO) 20 MG tablet Take 1 tablet (20 mg total) by mouth daily. 30 tablet 2   escitalopram (LEXAPRO) 20 MG tablet Take 1 tablet (20 mg total) by mouth daily. 30 tablet 7   Eszopiclone 3 MG TABS Take 1 tablet (3 mg total) by mouth at bedtime as needed. Take immediately before bedtime 30 tablet 5   levothyroxine (SYNTHROID) 112 MCG tablet Take 1 tablet by mouth once daily 90 tablet 0   lurasidone (LATUDA) 80 MG TABS tablet Take 0.5 tablets (40 mg total) by mouth daily with breakfast. 30 tablet 4   pravastatin (PRAVACHOL) 20 MG tablet Take 1 tablet by mouth once daily 90 tablet 0   Vibegron 75 MG TABS Take 1 tablet (75 mg total) by mouth daily. 30 tablet 5   No current facility-administered medications on file prior to visit.    ALLERGIES: Allergies  Allergen Reactions   Asa [Aspirin] Other (See Comments)    SOB and blocks ears and nose   Augmentin [Amoxicillin-Pot Clavulanate] Nausea And Vomiting   Crestor [Rosuvastatin]     Joint pain   Morphine And Codeine Nausea And Vomiting    FAMILY HISTORY: Family History  Problem Relation Age of Onset   Colon polyps Mother 53   Hyperlipidemia Mother    Colon cancer Mother 6   Lung cancer Father    Mental illness Father    Cancer Paternal Grandfather        mets   Leukemia Maternal Aunt     Esophageal cancer Neg Hx    Rectal cancer Neg Hx    Stomach cancer Neg Hx    Breast cancer Neg Hx       Objective:  Blood pressure (!) 123/51, pulse 67, height 5\' 2"  (1.575 m), weight 184 lb (83.5 kg), SpO2 93 %. General: No acute distress.  Patient appears well-groomed.   Head:  Normocephalic/atraumatic Eyes:  Fundi examined but not visualized Neck: supple, no paraspinal tenderness, full range of motion Heart:  Regular rate and rhythm Lungs:  Clear to auscultation bilaterally Back: No paraspinal tenderness Neurological Exam: alert and oriented.  Speech fluent and not dysarthric, language intact.  Able to repeat, name and follow commands.    09/09/2022    3:00 PM 02/06/2023    3:00 PM  [employer] Mental Exam  Weekday Correct 1 0  Current year 1 1  What state are we in? 1 1  Amount spent 0 1  Amount left 0 0  # of Animals 2 0  5 objects recall  1 2  Number series 1 1  Hour markers 0 0  Time correct 0 0  Placed X in triangle correctly 1 1  Largest Figure 1 1  Name of female 2 0  Date back to work 0 0  Type of work 2 0  State she lived in 0 0  Total score 13 8   CN II-XII intact. Bulk and tone normal, muscle strength 5/5 throughout.  Sensation to light touch intact.  Deep tendon reflexes 2+ throughout, toes downgoing.  Finger to nose testing intact.  Gait normal, Romberg negative.   Shon Millet, DO  CC: Cheryll Cockayne, MD

## 2023-02-04 NOTE — Progress Notes (Signed)
   Neuropsychology Feedback Session Paige [name]. [facility] Department of Neurology  Reason for Referral:   Paige [name] is a 72 y.o. right-handed Caucasian female referred by Shon Millet, D.O., to characterize her current cognitive functioning and assist with diagnostic clarity and treatment planning in the context of subjective cognitive decline.   Feedback:   Paige [name] completed a comprehensive neuropsychological evaluation on 01/27/2023. Please refer to that encounter for the full report and recommendations. Briefly, results suggested diffuse cognitive dysfunction. Adequate performances were exhibited across a single task of basic attention, as well as a single task assessing basic semantic knowledge. Some variability was also exhibited across recognition aspects of memory. However, all other assessed cognitive domains exhibited severe cognitive impairment. This includes processing speed, cognitive flexibility, receptive and expressive language, visuospatial abilities, and both encoding (i.e., learning) and delayed retrieval aspects of memory. Functionally, Paige [name]'s family has been assisting with medication and financial management since the reported onset of psychotic symptoms in mid-February. She continues to drive currently and and prior to mid-February, no functional concerns were noted. Given the extent of severe impairment, the latter remains quite surprising to me. At the present time, I feel she best meets diagnostic criteria for a Major Neurocognitive Disorder ("dementia").   Paige [name] was accompanied by her son during the current feedback session. Content of the current session focused on the results of her neuropsychological evaluation. Paige [name] was given the opportunity to ask questions and her questions were answered. She was encouraged to reach out should additional questions arise. A copy of her report was provided at the conclusion of the  visit.      One unit [phone] was billed for Dr. Tammy Sours time spent preparing for, conducting, and documenting the current feedback session with Paige [name].

## 2023-02-05 ENCOUNTER — Other Ambulatory Visit: Payer: Self-pay | Admitting: Internal Medicine

## 2023-02-06 ENCOUNTER — Encounter: Payer: Self-pay | Admitting: Neurology

## 2023-02-06 ENCOUNTER — Ambulatory Visit (INDEPENDENT_AMBULATORY_CARE_PROVIDER_SITE_OTHER): Payer: PPO | Admitting: Neurology

## 2023-02-06 VITALS — BP 123/51 | HR 67 | Ht 62.0 in | Wt 184.0 lb

## 2023-02-06 DIAGNOSIS — D320 Benign neoplasm of cerebral meninges: Secondary | ICD-10-CM

## 2023-02-06 DIAGNOSIS — F03B2 Unspecified dementia, moderate, with psychotic disturbance: Secondary | ICD-10-CM

## 2023-02-06 DIAGNOSIS — F29 Unspecified psychosis not due to a substance or known physiological condition: Secondary | ICD-10-CM

## 2023-02-06 NOTE — Patient Instructions (Signed)
Check MRI of brain with and without contrast Check EEG Further recommendations pending results.  Will likely order a spinal tap as well. Follow up

## 2023-02-13 ENCOUNTER — Ambulatory Visit
Admission: RE | Admit: 2023-02-13 | Discharge: 2023-02-13 | Disposition: A | Discharge status: Home / Self Care | Payer: PPO | Source: Ambulatory Visit | Attending: Neurology | Admitting: Neurology

## 2023-02-13 DIAGNOSIS — F29 Unspecified psychosis not due to a substance or known physiological condition: Secondary | ICD-10-CM

## 2023-02-13 DIAGNOSIS — R9089 Other abnormal findings on diagnostic imaging of central nervous system: Secondary | ICD-10-CM | POA: Diagnosis not present

## 2023-02-13 DIAGNOSIS — F03B2 Unspecified dementia, moderate, with psychotic disturbance: Secondary | ICD-10-CM

## 2023-02-13 MED ORDER — GADOPICLENOL 0.5 MMOL/ML IV SOLN
8.0000 mL | Freq: Once | INTRAVENOUS | Status: AC | PRN
  Administered 2023-02-13: 8 mL via INTRAVENOUS

## 2023-02-17 ENCOUNTER — Ambulatory Visit (INDEPENDENT_AMBULATORY_CARE_PROVIDER_SITE_OTHER): Payer: PPO

## 2023-02-17 VITALS — Ht 62.0 in | Wt 184.0 lb

## 2023-02-17 DIAGNOSIS — Z1239 Encounter for other screening for malignant neoplasm of breast: Secondary | ICD-10-CM

## 2023-02-17 DIAGNOSIS — Z0000 Encounter for general adult medical examination without abnormal findings: Secondary | ICD-10-CM

## 2023-02-17 NOTE — Patient Instructions (Addendum)
Paige [name] , Thank you for taking time to come for your Medicare Wellness Visit. I appreciate your ongoing commitment to your health goals. Please review the following plan we discussed and let me know if I can assist you in the future.   These are the goals we discussed:  Goals       Client understands the importance of follow-up with providers by attending scheduled visits. (pt-stated)      Patient stated: "I would like a new brain."        This is a list of the screening recommended for you and due dates:  Health Maintenance  Topic Date Due   Zoster (Shingles) Vaccine (1 of 2) Never done   COVID-19 Vaccine (5 - 2023-24 season) 04/18/2022   Mammogram  03/18/2023   Flu Shot  03/19/2023   Medicare Annual Wellness Visit  02/17/2024   DEXA scan (bone density measurement)  09/04/2025   Colon Cancer Screening  01/11/2027   DTaP/Tdap/Td vaccine (3 - Td or Tdap) 02/09/2029   Pneumonia Vaccine  Completed   Hepatitis C Screening  Completed   HPV Vaccine  Aged Out    Advanced directives: Yes; Please bring a copy of your health care power of attorney and living will to the office at your convenience.  Conditions/risks identified: Yes; Dementia and Recurrent Falls  Next appointment:   Preventive Care 65 Years and Older, Female Preventive care refers to lifestyle choices and visits with your health care provider that can promote health and wellness. What does preventive care include? A yearly physical exam. This is also called an annual well check. Dental exams once or twice a year. Routine eye exams. Ask your health care provider how often you should have your eyes checked. Personal lifestyle choices, including: Daily care of your teeth and gums. Regular physical activity. Eating a healthy diet. Avoiding tobacco and drug use. Limiting alcohol use. Practicing safe sex. Taking low-dose aspirin every day. Taking vitamin and mineral supplements as recommended by your health care  provider. What happens during an annual well check? The services and screenings done by your health care provider during your annual well check will depend on your age, overall health, lifestyle risk factors, and family history of disease. Counseling  Your health care provider may ask you questions about your: Alcohol use. Tobacco use. Drug use. Emotional well-being. Home and relationship well-being. Sexual activity. Eating habits. History of falls. Memory and ability to understand (cognition). Work and work Astronomer. Reproductive health. Screening  You may have the following tests or measurements: Height, weight, and BMI. Blood pressure. Lipid and cholesterol levels. These may be checked every 5 years, or more frequently if you are over 90 years old. Skin check. Lung cancer screening. You may have this screening every year starting at age 32 if you have a 30-pack-year history of smoking and currently smoke or have quit within the past 15 years. Fecal occult blood test (FOBT) of the stool. You may have this test every year starting at age 51. Flexible sigmoidoscopy or colonoscopy. You may have a sigmoidoscopy every 5 years or a colonoscopy every 10 years starting at age 18. Hepatitis C blood test. Hepatitis B blood test. Sexually transmitted disease (STD) testing. Diabetes screening. This is done by checking your blood sugar (glucose) after you have not eaten for a while (fasting). You may have this done every 1-3 years. Bone density scan. This is done to screen for osteoporosis. You may have this done starting at age  65. Mammogram. This may be done every 1-2 years. Talk to your health care provider about how often you should have regular mammograms. Talk with your health care provider about your test results, treatment options, and if necessary, the need for more tests. Vaccines  Your health care provider may recommend certain vaccines, such as: Influenza vaccine. This is  recommended every year. Tetanus, diphtheria, and acellular pertussis (Tdap, Td) vaccine. You may need a Td booster every 10 years. Zoster vaccine. You may need this after age 81. Pneumococcal 13-valent conjugate (PCV13) vaccine. One dose is recommended after age 71. Pneumococcal polysaccharide (PPSV23) vaccine. One dose is recommended after age 36. Talk to your health care provider about which screenings and vaccines you need and how often you need them. This information is not intended to replace advice given to you by your health care provider. Make sure you discuss any questions you have with your health care provider. Document Released: 08/31/2015 Document Revised: 04/23/2016 Document Reviewed: 06/05/2015 Elsevier Interactive Patient Education  2017 [place] Prevention in the Home Falls can cause injuries. They can happen to people of all ages. There are many things you can do to make your home safe and to help prevent falls. What can I do on the outside of my home? Regularly fix the edges of walkways and driveways and fix any cracks. Remove anything that might make you trip as you walk through a door, such as a raised step or threshold. Trim any bushes or trees on the path to your home. Use bright outdoor lighting. Clear any walking paths of anything that might make someone trip, such as rocks or tools. Regularly check to see if handrails are loose or broken. Make sure that both sides of any steps have handrails. Any raised decks and porches should have guardrails on the edges. Have any leaves, snow, or ice cleared regularly. Use sand or salt on walking paths during winter. Clean up any spills in your garage right away. This includes oil or grease spills. What can I do in the bathroom? Use night lights. Install grab bars by the toilet and in the tub and shower. Do not use towel bars as grab bars. Use non-skid mats or decals in the tub or shower. If you need to sit down in  the shower, use a plastic, non-slip stool. Keep the floor dry. Clean up any water that spills on the floor as soon as it happens. Remove soap buildup in the tub or shower regularly. Attach bath mats securely with double-sided non-slip rug tape. Do not have throw rugs and other things on the floor that can make you trip. What can I do in the bedroom? Use night lights. Make sure that you have a light by your bed that is easy to reach. Do not use any sheets or blankets that are too big for your bed. They should not hang down onto the floor. Have a firm chair that has side arms. You can use this for support while you get dressed. Do not have throw rugs and other things on the floor that can make you trip. What can I do in the kitchen? Clean up any spills right away. Avoid walking on wet floors. Keep items that you use a lot in easy-to-reach places. If you need to reach something above you, use a strong step stool that has a grab bar. Keep electrical cords out of the way. Do not use floor polish or wax that makes floors slippery.  If you must use wax, use non-skid floor wax. Do not have throw rugs and other things on the floor that can make you trip. What can I do with my stairs? Do not leave any items on the stairs. Make sure that there are handrails on both sides of the stairs and use them. Fix handrails that are broken or loose. Make sure that handrails are as long as the stairways. Check any carpeting to make sure that it is firmly attached to the stairs. Fix any carpet that is loose or worn. Avoid having throw rugs at the top or bottom of the stairs. If you do have throw rugs, attach them to the floor with carpet tape. Make sure that you have a light switch at the top of the stairs and the bottom of the stairs. If you do not have them, ask someone to add them for you. What else can I do to help prevent falls? Wear shoes that: Do not have high heels. Have rubber bottoms. Are comfortable  and fit you well. Are closed at the toe. Do not wear sandals. If you use a stepladder: Make sure that it is fully opened. Do not climb a closed stepladder. Make sure that both sides of the stepladder are locked into place. Ask someone to hold it for you, if possible. Clearly mark and make sure that you can see: Any grab bars or handrails. First and last steps. Where the edge of each step is. Use tools that help you move around (mobility aids) if they are needed. These include: Canes. Walkers. Scooters. Crutches. Turn on the lights when you go into a dark area. Replace any light bulbs as soon as they burn out. Set up your furniture so you have a clear path. Avoid moving your furniture around. If any of your floors are uneven, fix them. If there are any pets around you, be aware of where they are. Review your medicines with your doctor. Some medicines can make you feel dizzy. This can increase your chance of falling. Ask your doctor what other things that you can do to help prevent falls. This information is not intended to replace advice given to you by your health care provider. Make sure you discuss any questions you have with your health care provider. Document Released: 05/31/2009 Document Revised: 01/10/2016 Document Reviewed: 09/08/2014 Elsevier Interactive Patient Education  2017 [employer].

## 2023-02-17 NOTE — Progress Notes (Signed)
Subjective:   Paige [name] is a 72 y.o. female who presents for Medicare Annual (Subsequent) preventive examination.  Visit Complete: Virtual  I connected with  RAIGHAN WUNDERLICH on 02/17/23 by a audio enabled telemedicine application and verified that I am speaking with the correct person using two identifiers.  Patient Location: Home  Provider Location: Office/Clinic  I discussed the limitations of evaluation and management by telemedicine. The patient expressed understanding and agreed to proceed.  Review of Systems     Cardiac Risk Factors include: advanced age (>44men, >17 women);dyslipidemia;hypertension;obesity (BMI >30kg/m2);sedentary lifestyle     Objective:    Today's Vitals   02/17/23 1605 02/17/23 1606  Weight: 184 lb (83.5 kg)   Height: 5\' 2"  (1.575 m)   PainSc: 10-Worst pain ever 10-Worst pain ever  PainLoc: Head    Body mass index is 33.65 kg/m.     02/17/2023    4:09 PM 02/06/2023    3:29 PM 12/04/2022    3:41 PM 11/26/2022    9:31 PM 02/10/2022   10:00 AM 01/31/2021    1:04 PM 01/05/2020    2:58 PM  Advanced Directives  Does Patient Have a Medical Advance Directive? Yes No No No No No No  Type of Estate agent of [place];Living will        Copy of Healthcare Power of Attorney in Chart? No - copy requested        Would patient like information on creating a medical advance directive?     No - Patient declined No - Patient declined     Current Medications (verified) Outpatient Encounter Medications as of 02/17/2023  Medication Sig   bisoprolol-hydrochlorothiazide (ZIAC) 5-6.25 MG tablet Take 1 tablet by mouth once daily   escitalopram (LEXAPRO) 20 MG tablet 1  qam   escitalopram (LEXAPRO) 20 MG tablet Take 1 tablet (20 mg total) by mouth daily.   escitalopram (LEXAPRO) 20 MG tablet Take 1 tablet (20 mg total) by mouth daily.   Eszopiclone 3 MG TABS Take 1 tablet (3 mg total) by mouth at bedtime as needed. Take immediately  before bedtime   levothyroxine (SYNTHROID) 112 MCG tablet Take 1 tablet by mouth once daily   lurasidone (LATUDA) 80 MG TABS tablet Take 0.5 tablets (40 mg total) by mouth daily with breakfast.   pravastatin (PRAVACHOL) 20 MG tablet Take 1 tablet by mouth once daily   Vibegron 75 MG TABS Take 1 tablet (75 mg total) by mouth daily.   No facility-administered encounter medications on file as of 02/17/2023.    Allergies (verified) Asa [aspirin], Augmentin [amoxicillin-pot clavulanate], Crestor [rosuvastatin], and Morphine and codeine   History: Past Medical History:  Diagnosis Date   AK (actinic keratosis) 10/19/2012   Left shoulder   Attention deficit hyperactivity disorder (ADHD) 06/08/2007   Qualifier: Diagnosis of   By: Mayford Knife, LPN, Domenic Polite   Auditory hallucinations 09/2022   Back pain with radiculopathy 05/28/2009   Basal cell carcinoma (BCC) of back 11/16/2009   Carpal tunnel syndrome    Cellulitis and abscess of trunk 10/11/2021   Chronic bilateral low back pain without sciatica 07/30/2017   Closed fracture of nasal bones 02/13/2019   Colon polyp    Constipation    Dementia, unclear and potentially mixed etiology 01/27/2023   Deviated septum 02/21/2019   Essential hypertension 06/08/2007   Gallstones    Generalized anxiety disorder 07/30/2009   GERD (gastroesophageal reflux disease)    Hyperlipidemia    Hypothyroidism 06/08/2007  S/p RAI treatment   Left hip pain 03/27/2016   Left wrist pain 12/10/2020   Lumbar radiculopathy 03/19/2016   Major depressive disorder 12/04/2022   Meningioma 07/31/2022   14 mm right frontal lobe -seen on MRI 07/2022   Neck pain 01/16/2016   Osteoarthritis 12/20/2007   Overactive bladder 10/05/2020   Pain in joint of left knee 08/09/2020   Pain of right thumb 09/22/2019   Piriformis syndrome of left side 04/29/2016   Prediabetes 07/05/2018   Pseudogout of hand, left 04/29/2022   Pseudogout of left wrist 01/21/2021   Right shoulder  pain 02/14/2019   Status post dilation of esophageal narrowing    Thrombosed hemorrhoids 10/17/2008   Visual hallucinations 09/2022   fully-formed   Past Surgical History:  Procedure Laterality Date   ABDOMINAL HYSTERECTOMY     CARPAL TUNNEL RELEASE Bilateral    CHOLECYSTECTOMY     CLOSED REDUCTION HAND FRACTURE     right hand   COLONOSCOPY  2018   JP-MAC-suprep(exc)-int hems-5 yr recall-   COLONOSCOPY  2013   TA   fx leg     NASAL SINUS SURGERY     POLYPECTOMY  2013   TA   SHOULDER SURGERY     reattac tendon; right shoulder   TIBIA FRACTURE SURGERY     steel rod right leg   TUBAL LIGATION     Family History  Problem Relation Age of Onset   Colon polyps Mother 32   Hyperlipidemia Mother    Colon cancer Mother 30   Lung cancer Father    Mental illness Father    Cancer Paternal Grandfather        mets   Leukemia Maternal Aunt    Esophageal cancer Neg Hx    Rectal cancer Neg Hx    Stomach cancer Neg Hx    Breast cancer Neg Hx    Social History   Socioeconomic History   Marital status: Widowed    Spouse name: Not on file   Number of children: 2   Years of education: 12   Highest education level: High school graduate  Occupational History   Occupation: Retired    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   Are you right handed or left handed?    Are you currently employed ? Yes    What is your current occupation? at [employer]      Do you live at home alone? YEs   Who lives with you?    What type of home do you live in: 1 story or 2 story? 1       Social  Determinants of Health   Financial Resource Strain: Low Risk  (02/17/2023)   Overall Financial Resource Strain (CARDIA)    Difficulty of Paying Living Expenses: Not very hard  Food Insecurity: No Food Insecurity (02/17/2023)   Hunger Vital Sign    Worried About Running Out of Food in the Last Year: Never true    Ran Out  of Food in the Last Year: Never true  Transportation Needs: No Transportation Needs (02/17/2023)   PRAPARE - Administrator, Civil Service (Medical): No    Lack of Transportation (Non-Medical): No  Physical Activity: Inactive (02/17/2023)   Exercise Vital Sign    Days of Exercise per Week: 0 days    Minutes of Exercise per Session: 0 min  Stress: No Stress Concern Present (02/17/2023)   [employer] of Occupational Health - Occupational Stress Questionnaire    Feeling of Stress : Only a little  Social Connections: Socially Isolated (02/17/2023)   Social Connection and Isolation Panel [NHANES]    Frequency of Communication with Friends and Family: Twice a week    Frequency of Social Gatherings with Friends and Family: Twice a week    Attends Religious Services: Never    Database administrator or Organizations: No    Attends Banker Meetings: Never    Marital Status: Widowed    Tobacco Counseling Counseling given: Not Answered   Clinical Intake:  Pre-visit preparation completed: Yes  Pain : 0-10 Pain Score: 10-Worst pain ever Pain Type: Chronic pain Pain Location: Head     BMI - recorded: 33.65 Nutritional Status: BMI > 30  Obese Nutritional Risks: None Diabetes: No  How often do you need to have someone help you when you read instructions, pamphlets, or other written materials from your doctor or pharmacy?: 1 - Never What is the last grade level you completed in school?: HSG  Interpreter Needed?: No  Information entered by :: Susie Cassette, LPN.   Activities of Daily Living    02/17/2023    4:16 PM  In your present state  of health, do you have any difficulty performing the following activities:  Hearing? 0  Vision? 0  Difficulty concentrating or making decisions? 1  Walking or climbing stairs? 0  Dressing or bathing? 0  Doing errands, shopping? 1  Preparing Food and eating ? Y  Comment not able to cook  Using the Toilet? N  In the past six months, have you accidently leaked urine? N  Do you have problems with loss of bowel control? N  Managing your Medications? Y  Managing your Finances? Y  Housekeeping or managing your Housekeeping? Y    Patient Care Team: Pincus Sanes, MD as PCP - General (Internal Medicine) Archer Asa, MD as Consulting Physician (Psychiatry) Dominica Severin, MD as Consulting Physician (Orthopedic Surgery) Nelson Chimes, MD as Consulting Physician (Ophthalmology) Drema Dallas, DO as Consulting Physician (Neurology) Selmer Dominion, NP as Nurse Practitioner (Obstetrics and Gynecology)  Indicate any recent Medical Services you may have received from other than [facility] providers in the past year (date may be approximate).     Assessment:   This is a routine wellness examination for Paige [name].  Hearing/Vision screen Hearing Screening - Comments:: Patient denied any hearing difficulty.   No hearing aids.  Vision Screening - Comments:: Patient does wear corrective lenses/contacts.  [facility] exam done by: [facility]   Dietary issues and exercise activities discussed:     Goals Addressed               This Visit's Progress     Client understands the importance of follow-up with providers by attending scheduled visits. (pt-stated)        Patient stated: "I would like a new brain."      Depression Screen    02/17/2023    4:13 PM 07/28/2022  9:37 AM 04/29/2022    1:16 PM 02/10/2022   10:01 AM 02/10/2022    9:58 AM 01/23/2022    9:33 AM 10/11/2021    8:17 AM  PHQ 2/9 Scores  PHQ - 2 Score 2 6 0 0 0 2 1  PHQ- 9 Score 15 21 0   9     Fall Risk    02/17/2023     4:10 PM 02/06/2023    3:29 PM 09/09/2022    2:33 PM 07/28/2022    9:37 AM 04/29/2022    1:16 PM  Fall Risk   Falls in the past year? 1 0 0 0 0  Number falls in past yr: 1 0 0 0 0  Comment 4 falls      Injury with Fall? 0 0 0 0 0  Risk for fall due to : History of fall(s)   No Fall Risks No Fall Risks  Follow up Falls evaluation completed;Education provided;Falls prevention discussed Falls evaluation completed Falls evaluation completed Falls evaluation completed Falls evaluation completed    MEDICARE RISK AT HOME:  Medicare Risk at Home - 02/17/23 1609     Any stairs in or around the home? No    If so, are there any without handrails? No    Home free of loose throw rugs in walkways, pet beds, electrical cords, etc? Yes    Adequate lighting in your home to reduce risk of falls? Yes    Life alert? No    Use of a cane, walker or w/c? No    Grab bars in the bathroom? Yes    Shower chair or bench in shower? No    Elevated toilet seat or a handicapped toilet? No             TIMED UP AND GO:  Was the test performed?  No    Cognitive Function:    02/11/2018   11:06 AM  MMSE - Mini Mental State Exam  Orientation to time 5  Orientation to Place 5  Registration 3  Attention/ Calculation 5  Recall 1  Language- name 2 objects 2  Language- repeat 1  Language- follow 3 step command 3  Language- read & follow direction 1  Write a sentence 1  Copy design 1  Total score 28        02/17/2023    4:20 PM  6CIT Screen  What Year? 4 points  What month? 0 points  What time? 3 points  Count back from 20 4 points  Months in reverse 4 points  Repeat phrase 10 points  Total Score 25 points    Immunizations Immunization History  Administered Date(s) Administered   Fluad Quad(high Dose 65+) 05/16/2019, 06/16/2020, 06/08/2021   Influenza, High Dose Seasonal PF 06/27/2016, 07/03/2017, 07/05/2018, 06/03/2022   PFIZER(Purple Top)SARS-COV-2 Vaccination 09/22/2019, 10/13/2019,  06/02/2020   Pfizer Covid-19 Vaccine Bivalent Booster 6yrs & up 05/16/2021   Pneumococcal Conjugate-13 07/18/2016   Pneumococcal Polysaccharide-23 05/25/2015   Tdap 03/03/2012, 02/10/2019    TDAP status: Up to date  Flu Vaccine status: Up to date  Pneumococcal vaccine status: Up to date  Covid-19 vaccine status: Completed vaccines  Qualifies for Shingles Vaccine? Yes   Zostavax completed No   Shingrix Completed?: No.    Education has been provided regarding the importance of this vaccine. Patient has been advised to call insurance company to determine out of pocket expense if they have not yet received this vaccine. Advised may also receive vaccine at local  pharmacy or Health Dept. Verbalized acceptance and understanding.  Screening Tests Health Maintenance  Topic Date Due   Zoster Vaccines- Shingrix (1 of 2) Never done   COVID-19 Vaccine (5 - 2023-24 season) 04/18/2022   MAMMOGRAM  03/18/2023   INFLUENZA VACCINE  03/19/2023   Medicare Annual Wellness (AWV)  02/17/2024   DEXA SCAN  09/04/2025   Colonoscopy  01/11/2027   DTaP/Tdap/Td (3 - Td or Tdap) 02/09/2029   Pneumonia Vaccine 39+ Years old  Completed   Hepatitis C Screening  Completed   HPV VACCINES  Aged Out    Health Maintenance  Health Maintenance Due  Topic Date Due   Zoster Vaccines- Shingrix (1 of 2) Never done   COVID-19 Vaccine (5 - 2023-24 season) 04/18/2022    Colorectal cancer screening: Type of screening: Colonoscopy. Completed 01/10/2022. Repeat every 5 years  Mammogram status: Ordered 02/17/2023. Pt provided with contact info and advised to call to schedule appt.   Bone Density status: Completed 09/04/2020. Results reflect: Bone density results: NORMAL. Repeat every 5 years.  Lung Cancer Screening: (Low Dose CT Chest recommended if Age 8-80 years, 20 pack-year currently smoking OR have quit w/in 15years.) does not qualify.   Lung Cancer Screening Referral: no  Additional Screening:  Hepatitis C  Screening: does qualify; Completed 06/27/2016  Vision Screening: Recommended annual ophthalmology exams for early detection of glaucoma and other disorders of the eye. Is the patient up to date with their annual eye exam?  Yes  Who is the provider or what is the name of the office in which the patient attends annual eye exams? [facility] If pt is not established with a provider, would they like to be referred to a provider to establish care? No .   Dental Screening: Recommended annual dental exams for proper oral hygiene  Diabetic Foot Exam: N/A  Community Resource Referral / Chronic Care Management: CRR required this visit?  No   CCM required this visit?  No     Plan:     I have personally reviewed and noted the following in the patient's chart:   Medical and social history Use of alcohol, tobacco or illicit drugs  Current medications and supplements including opioid prescriptions. Patient is not currently taking opioid prescriptions. Functional ability and status Nutritional status Physical activity Advanced directives List of other physicians Hospitalizations, surgeries, and ER visits in previous 12 months Vitals Screenings to include cognitive, depression, and falls Referrals and appointments  In addition, I have reviewed and discussed with patient certain preventive protocols, quality metrics, and best practice recommendations. A written personalized care plan for preventive services as well as general preventive health recommendations were provided to patient.     Mickeal Needy, LPN   0/04/6044   After Visit Summary: (Mail) Due to this being a telephonic visit, the after visit summary with patients personalized plan was offered to patient via mail   Nurse Notes:  Patient has current diagnosis of cognitive impairment.  Patient is followed by neurology for ongoing assessment.  Patient is unable to complete screening 6CIT or MMSE.

## 2023-02-18 ENCOUNTER — Ambulatory Visit (INDEPENDENT_AMBULATORY_CARE_PROVIDER_SITE_OTHER): Payer: PPO | Admitting: Neurology

## 2023-02-18 DIAGNOSIS — F29 Unspecified psychosis not due to a substance or known physiological condition: Secondary | ICD-10-CM

## 2023-02-18 DIAGNOSIS — F03B2 Unspecified dementia, moderate, with psychotic disturbance: Secondary | ICD-10-CM | POA: Diagnosis not present

## 2023-02-18 NOTE — Progress Notes (Signed)
EEG complete - results pending 

## 2023-02-23 NOTE — Procedures (Signed)
ELECTROENCEPHALOGRAM REPORT  Date of Study: 02/18/2023  Patient's Name: Paige [name] MRN: [id number] Date of Birth: 1951/07/29   Clinical History: 72 year old female with memory deficits and psychosis  Medications: Current Outpatient Medications on File Prior to Visit  Medication Sig Dispense Refill   bisoprolol-hydrochlorothiazide (ZIAC) 5-6.25 MG tablet Take 1 tablet by mouth once daily 90 tablet 0   escitalopram (LEXAPRO) 20 MG tablet 1  qam 30 tablet 3   escitalopram (LEXAPRO) 20 MG tablet Take 1 tablet (20 mg total) by mouth daily. 30 tablet 2   escitalopram (LEXAPRO) 20 MG tablet Take 1 tablet (20 mg total) by mouth daily. 30 tablet 7   Eszopiclone 3 MG TABS Take 1 tablet (3 mg total) by mouth at bedtime as needed. Take immediately before bedtime 30 tablet 5   levothyroxine (SYNTHROID) 112 MCG tablet Take 1 tablet by mouth once daily 90 tablet 0   lurasidone (LATUDA) 80 MG TABS tablet Take 0.5 tablets (40 mg total) by mouth daily with breakfast. 30 tablet 4   pravastatin (PRAVACHOL) 20 MG tablet Take 1 tablet by mouth once daily 90 tablet 1   Vibegron 75 MG TABS Take 1 tablet (75 mg total) by mouth daily. 30 tablet 5   No current facility-administered medications on file prior to visit.    Technical Summary: A multichannel digital EEG recording measured by the international 10-20 system with electrodes applied with paste and impedances below 5000 ohms performed in our laboratory with EKG monitoring in an awake and drowsy patient.  Photic stimulation was performed.  The digital EEG was referentially recorded, reformatted, and digitally filtered in a variety of bipolar and referential montages for optimal display.    Description: The patient is awake and drowsy during the recording.  During maximal wakefulness, there is a symmetric, medium voltage 8 Hz posterior dominant rhythm that attenuates with eye opening.  The record is symmetric.  Stage 2 sleep was not seen.  Photic  stimulation did not elicit any abnormalities.  There were no epileptiform discharges or electrographic seizures seen.    EKG lead was unremarkable.  Impression: This awake and drowsy EEG is normal.    Clinical Correlation: A normal EEG does not exclude a clinical diagnosis of epilepsy.  If further clinical questions remain, prolonged EEG may be helpful.  Clinical correlation is advised.   Shon Millet, DO

## 2023-02-24 ENCOUNTER — Telehealth: Payer: Self-pay

## 2023-02-24 DIAGNOSIS — F0390 Unspecified dementia without behavioral disturbance: Secondary | ICD-10-CM

## 2023-02-24 NOTE — Telephone Encounter (Signed)
Patient daughter in law advised of orders added. And stop by the office get paperwork to take to the lab.   Front desk please hand patient paperwork when she comes to check in for lab. This paperwork is needed to go with the labs to the [facility] clinic.

## 2023-02-24 NOTE — Telephone Encounter (Signed)
-----   Message from Drema Dallas, DO sent at 02/23/2023 12:25 PM EDT ----- MRI of brain and EEG are unrevealing.  As discussed at her visit, I think the next step would be a spinal tap to check labs to help establish a specific diagnosis.

## 2023-03-02 ENCOUNTER — Other Ambulatory Visit (HOSPITAL_COMMUNITY): Payer: Self-pay | Admitting: *Deleted

## 2023-03-02 MED ORDER — LURASIDONE HCL 80 MG PO TABS: 80.0000 mg | ORAL_TABLET | Freq: Every day | ORAL | 0 refills | Status: DC

## 2023-03-11 ENCOUNTER — Encounter (HOSPITAL_COMMUNITY): Payer: Self-pay | Admitting: Psychiatry

## 2023-03-11 ENCOUNTER — Ambulatory Visit (HOSPITAL_COMMUNITY): Payer: PPO | Admitting: Psychiatry

## 2023-03-11 ENCOUNTER — Other Ambulatory Visit: Payer: Self-pay

## 2023-03-11 VITALS — BP 134/90 | HR 63 | Ht 62.0 in | Wt 182.0 lb

## 2023-03-11 DIAGNOSIS — F329 Major depressive disorder, single episode, unspecified: Secondary | ICD-10-CM | POA: Diagnosis not present

## 2023-03-11 MED ORDER — ESZOPICLONE 3 MG PO TABS: 3.0000 mg | ORAL_TABLET | Freq: Every evening | ORAL | 5 refills | Status: DC | PRN

## 2023-03-11 MED ORDER — LURASIDONE HCL 80 MG PO TABS: 80.0000 mg | ORAL_TABLET | Freq: Every day | ORAL | 5 refills | Status: DC

## 2023-03-11 MED ORDER — ESCITALOPRAM OXALATE 20 MG PO TABS: ORAL_TABLET | ORAL | 5 refills | Status: DC

## 2023-03-11 NOTE — Progress Notes (Signed)
Patient ID: Paige [name], female   DOB: May 02, 1951, 72 y.o.   MRN: [id number] Patient ID: Paige [name], female   DOB: [birth year], 72 y.o.   MRN: [id number] [facility] MD Progress Note  03/11/2023 3:47 PM Victorino December Jimmerson   Pa time.st Medical History:   MRN:  [id number] Diagnosis major depressi  Today the patient is seen with her son [name].  This patient seems to be significantly impaired.  Neuropsychological testing demonstrates deficits extensive cognitive deficits.  She is having difficulty working with TV remote, the microwave or opening doors.  Her language is definitely impaired.  The patient's affect is flat.  Her complaints of auditory hallucinations have lessened.  According to her son she does not really talk about what people say to her anymore she does not seem to be distressed by this.  The patient does not cry.  It is hard to interpret if she is sleeping that well but she is actually eating okay.  She is not incontinent.  She is not violent or agitated.  They have somebody with her all the time otherwise I suspect she might try to elope.  The patient does not appear distressed.  She seems very impaired.  She apparently has a lumbar puncture scheduled in the near future.  Her MRIs and CAT scans have not shown a significant etiology to explain her cognitive decline.  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  At no time during this.  Where there are any complaints to me from the patient or anyone that she was cognitively declining.  Therefore by viewing her cognitive decline is relatively acute.  I am unsure of the etiology of this condition.  It is hard to imagine this is Alzheimer's dementia.  It is noted the patient's mother was in her 90s is still driving and still functioning.  Her mother therefore is in much better cognitive shape than the  patient. At this time the patient this patient will continue taking all the medications prescribed to have a clear etiology of what is going on.  It is very hard to interpret all this is simple clinical depression.  I do not believe that the neuropsych testing implicated a major psychiatric condition. Past Medical History:  Diagnosis Date   AK (actinic keratosis) 10/19/2012   Left shoulder   Attention deficit hyperactivity disorder (ADHD) 06/08/2007   Qualifier: Diagnosis of   By: Mayford Knife, LPN, Domenic Polite   Auditory hallucinations 09/2022   Back pain with radiculopathy 05/28/2009   Basal cell carcinoma (BCC) of back 11/16/2009   Carpal tunnel syndrome    Cellulitis and abscess of trunk 10/11/2021   Chronic bilateral low back pain without sciatica 07/30/2017   Closed fracture of nasal bones 02/13/2019   Colon polyp    Constipation    Dementia, unclear and potentially mixed etiology 01/27/2023   Deviated septum 02/21/2019   Essential hypertension 06/08/2007   Gallstones    Generalized anxiety disorder 07/30/2009   GERD (gastroesophageal reflux disease)    Hyperlipidemia    Hypothyroidism 06/08/2007   S/p RAI treatment   Left hip pain 03/27/2016   Left wrist pain 12/10/2020   Lumbar radiculopathy 03/19/2016   Major depressive disorder 12/04/2022   Meningioma 07/31/2022   14 mm right frontal lobe -seen on MRI 07/2022   Neck pain 01/16/2016   Osteoarthritis  12/20/2007   Overactive bladder 10/05/2020   Pain in joint of left knee 08/09/2020   Pain of right thumb 09/22/2019   Piriformis syndrome of left side 04/29/2016   Prediabetes 07/05/2018   Pseudogout of hand, left 04/29/2022   Pseudogout of left wrist 01/21/2021   Right shoulder pain 02/14/2019   Status post dilation of esophageal narrowing    Thrombosed hemorrhoids 10/17/2008   Visual hallucinations 09/2022   fully-formed    Past Surgical History:  Procedure Laterality Date   ABDOMINAL HYSTERECTOMY     CARPAL TUNNEL  RELEASE Bilateral    CHOLECYSTECTOMY     CLOSED REDUCTION HAND FRACTURE     right hand   COLONOSCOPY  2018   JP-MAC-suprep(exc)-int hems-5 yr recall-   COLONOSCOPY  2013   TA   fx leg     NASAL SINUS SURGERY     POLYPECTOMY  2013   TA   SHOULDER SURGERY     reattac tendon; right shoulder   TIBIA FRACTURE SURGERY     steel rod right leg   TUBAL LIGATION     Family History:  Family History  Problem Relation Age of Onset   Colon polyps Mother 23   Hyperlipidemia Mother    Colon cancer Mother 63   Lung cancer Father    Mental illness Father    Cancer Paternal Grandfather        mets   Leukemia Maternal Aunt    Esophageal cancer Neg Hx    Rectal cancer Neg Hx    Stomach cancer Neg Hx    Breast cancer Neg Hx    Family Psychiatric  History:  Social History:  Social History   Substance and Sexual Activity  Alcohol Use No     Social History   Substance and Sexual Activity  Drug Use No    Social History   Socioeconomic History   Marital status: Widowed    Spouse name: Not on file   Number of children: 2   Years of education: 12   Highest education level: High school graduate  Occupational History   Occupation: Retired    Associate Professor: [employer]    Comment: cafeteria  Tobacco Use   Smoking status: Never   Smokeless tobacco: Never  Vaping Use   Vaping status: Never Used  Substance and Sexual Activity   Alcohol use: No   Drug use: No   Sexual activity: Not Currently  Other Topics Concern   Not on file  Social History Narrative   Works at [employer] - husband died of lung cancer      No regular exercise.   Are you right handed or left handed?    Are you currently employed ? Yes    What is your current occupation? at [employer]      Do you live at home alone? YEs   Who lives with you?    What type of home do you live in: 1 story or 2 story? 1       Social Determinants of Health   Financial  Resource Strain: Low Risk  (02/17/2023)   Overall Financial Resource Strain (CARDIA)    Difficulty of Paying Living Expenses: Not very hard  Food Insecurity: No Food Insecurity (02/17/2023)   Hunger Vital Sign    Worried About Running Out of Food in the Last Year: Never true    Ran Out of Food in the Last Year:  Never true  Transportation Needs: No Transportation Needs (02/17/2023)   PRAPARE - Administrator, Civil Service (Medical): No    Lack of Transportation (Non-Medical): No  Physical Activity: Inactive (02/17/2023)   Exercise Vital Sign    Days of Exercise per Week: 0 days    Minutes of Exercise per Session: 0 min  Stress: No Stress Concern Present (02/17/2023)   [employer] of Occupational Health - Occupational Stress Questionnaire    Feeling of Stress : Only a little  Social Connections: Socially Isolated (02/17/2023)   Social Connection and Isolation Panel [NHANES]    Frequency of Communication with Friends and Family: Twice a week    Frequency of Social Gatherings with Friends and Family: Twice a week    Attends Religious Services: Never    Database administrator or Organizations: No    Attends Banker Meetings: Never    Marital Status: Widowed   Additional Social History:                         Sleep: Fair  Appetite:  Good  Current Medications: Current Outpatient Medications  Medication Sig Dispense Refill   bisoprolol-hydrochlorothiazide (ZIAC) 5-6.25 MG tablet Take 1 tablet by mouth once daily 90 tablet 0   escitalopram (LEXAPRO) 20 MG tablet Take 1 tablet (20 mg total) by mouth daily. 30 tablet 2   escitalopram (LEXAPRO) 20 MG tablet Take 1 tablet (20 mg total) by mouth daily. 30 tablet 7   levothyroxine (SYNTHROID) 112 MCG tablet Take 1 tablet by mouth once daily 90 tablet 0   pravastatin (PRAVACHOL) 20 MG tablet Take 1 tablet by mouth once daily 90 tablet 1   Vibegron 75 MG TABS Take 1 tablet (75 mg total) by mouth daily. 30 tablet  5   escitalopram (LEXAPRO) 20 MG tablet 1  qam 30 tablet 5   Eszopiclone 3 MG TABS Take 1 tablet (3 mg total) by mouth at bedtime as needed. Take immediately before bedtime 30 tablet 5   lurasidone (LATUDA) 80 MG TABS tablet Take 1 tablet (80 mg total) by mouth daily with breakfast. 30 tablet 5   No current facility-administered medications for this visit.    Lab Results: No results found for this or any previous visit (from the past 48 hour(s)).  Blood Alcohol level:  No results found for: "ETH"  Physical Findings: AIMS:  , ,  ,  ,    CIWA:    COWS:     Musculoskeletal: Strength & Muscle Tone: within normal limits Gait & Station: normal Patient leans: N/A  Psychiatric Specialty Exam: ROS  Blood pressure (!) 134/90, pulse 63, height 5\' 2"  (1.575 m), weight 182 lb (82.6 kg).Body mass index is 33.29 kg/m.  General Appearance: Fairly Groomed  Patent attorney::  Good  Speech:  Clear and Coherent  Volume:  Normal  Mood:  Euthymic  Affect:  Blunt  Thought Process:  Coherent  Orientation:  Full (Time, Place, and Person)  Thought Content:  WDL  Suicidal Thoughts:  No  Homicidal Thoughts:  No  Memory:  NA  Judgement:  NA  Insight:  Good  Psychomotor Activity:  Decreased  Concentration:  Fair  Recall:  [employer] of Knowledge:Good  Language: Fair  Akathisia:  No  Handed:  Right  AIMS (if indicated):     Assets:  Desire for Improvement  ADL's:  Intact  Cognition: WNL  Sleep:  Treatment Plan Summary: 03/11/2023, 3:47 PM   At this time the patient is doing poorly.  Her functioning is going down.  She is having doing having trouble doing small things around her.  Her language skills have dropped off significantly.  She does not appear to be overly distressed.  Her voices seem to be reduced.  The possibility of starting to reduce her Kasandra Knudsen will be considered.  I am uncomfortable stopping her sleeping aid.  She still takes her antidepressant and I will change that.  She will  return to see me in 3 months.  I had a discussion with her son 10 about the importance of having around-the-clock care.  This of course could be the family or perhaps part-time aids.  Based upon the course of this patient's decline I can imagine that she is going to need assistance even with her basic ADLs in the next 6 months to a year.

## 2023-03-18 ENCOUNTER — Ambulatory Visit
Admission: RE | Admit: 2023-03-18 | Discharge: 2023-03-18 | Disposition: A | Discharge status: Home / Self Care | Payer: PPO | Source: Ambulatory Visit | Attending: Neurology | Admitting: Neurology

## 2023-03-18 ENCOUNTER — Other Ambulatory Visit (HOSPITAL_COMMUNITY)
Admission: RE | Admit: 2023-03-18 | Discharge: 2023-03-18 | Disposition: A | Discharge status: Home / Self Care | Payer: PPO | Source: Ambulatory Visit | Attending: Neurology | Admitting: Neurology

## 2023-03-18 VITALS — BP 148/69 | HR 71

## 2023-03-18 DIAGNOSIS — R440 Auditory hallucinations: Secondary | ICD-10-CM | POA: Insufficient documentation

## 2023-03-18 DIAGNOSIS — F329 Major depressive disorder, single episode, unspecified: Secondary | ICD-10-CM | POA: Diagnosis not present

## 2023-03-18 DIAGNOSIS — F0390 Unspecified dementia without behavioral disturbance: Secondary | ICD-10-CM | POA: Insufficient documentation

## 2023-03-18 DIAGNOSIS — F03B2 Unspecified dementia, moderate, with psychotic disturbance: Secondary | ICD-10-CM

## 2023-03-18 DIAGNOSIS — R441 Visual hallucinations: Secondary | ICD-10-CM

## 2023-03-18 DIAGNOSIS — F411 Generalized anxiety disorder: Secondary | ICD-10-CM | POA: Diagnosis not present

## 2023-03-18 LAB — PROTEIN, CSF: Total Protein, CSF: 56 mg/dL (ref 15–60)

## 2023-03-18 LAB — CSF CELL COUNT WITH DIFFERENTIAL
RBC Count, CSF: 0 cells/uL
TOTAL NUCLEATED CELL: 1 cells/uL (ref 0–5)

## 2023-03-18 LAB — GLUCOSE, CSF: Glucose, CSF: 75 mg/dL (ref 40–80)

## 2023-03-18 NOTE — Progress Notes (Signed)
1 vial of blood drawn from pts L hand  to be sent off with LP lab work. 1 successful attempt, pt tolerated well. Gauze and tape applied after.

## 2023-03-18 NOTE — Discharge Instructions (Signed)

## 2023-03-20 ENCOUNTER — Ambulatory Visit: Payer: PPO

## 2023-03-23 DIAGNOSIS — F0390 Unspecified dementia without behavioral disturbance: Secondary | ICD-10-CM | POA: Diagnosis not present

## 2023-03-31 ENCOUNTER — Telehealth: Payer: Self-pay | Admitting: Neurology

## 2023-03-31 NOTE — Telephone Encounter (Signed)
Caller stating she would like to speak with nurse about results from Lumbar

## 2023-04-01 ENCOUNTER — Ambulatory Visit: Payer: PPO

## 2023-04-01 NOTE — Telephone Encounter (Signed)
Spoke to patient daughter in Social worker. Patient son will be off on 30th of Aug. That is the only day he can bring her unless the visit is at 3:30 pm.   Please add patient to the wait list and if a 3:30 appointment comes up she will like to be called.

## 2023-04-02 ENCOUNTER — Encounter (INDEPENDENT_AMBULATORY_CARE_PROVIDER_SITE_OTHER): Payer: Self-pay

## 2023-04-15 NOTE — Progress Notes (Signed)
NEUROLOGY FOLLOW UP OFFICE NOTE  Paige [name] [id number]  Assessment/Plan:   Major neurocognitive disorder.  Given CSF results, likely logopenic primary progressive aphasia/Alzheimer's dementia but concern for a concomitant etiology as this is an atypical presentation due to prominent sudden onset psychosis/hallucinations, subacute progression and evidence of diffuse cognitive dysfunction on neuropsych testing.  She appears to have more bradykinesia and shuffling gait which is new.  Autoimmune/paraneoplastic panel negative. Psychosis Right frontal lobe meningioma   Start memantine titrating to 10mg  twice daily 14-3-3 pending Will refer to the memory clinic at [facility] for further evaluation Follow up 6 months.   Subjective:  Paige [name] is a 72 year old right-handed female with ADD, HTN, HLD, anxiety and hypothyroidism who follows up for aphasia and new psychosis.  Psychiatry notes reviewed.  History supplemented by accompanying mother and son.  UPDATE: Routine EEG on 02/18/2023 was normal.  Underwent LP for CSF analysis, which revealed elevated p-Tau/Abeta42 of 0.043 and phosho-Tau of 25.8.  [facility] autoimmune/paraneoplastic dementia panel was negative.  14-3-3 is still in process.  Sometimes needs assistance performing routine activities.  May have difficulty using the TV remote.  May have difficulty washing dishes.  In the morning, she may put on a long sleeve shirt in 90 degree weather.  She no longer puts her clothes away.  Sometimes somebody needs to be on stand by when she bathes.  On current management of Lexapro, Latuda and eszopiclone.  Hallucinations and agitation have improved.  No tremors or other non-purposeful movements.   03/18/2023 CSF LABS:  [facility] Alzheimer Disease Eval with elevated p-Tau/Abeta42 0.043, elevated phospho-Tau 25.8, low Abeta42 595, normal total-Tau 237; [facility] Autoimmune/Paraneoplastic Dementia panel negative; negative ANA; cell count 1; protein 56;  glucose 75; VDRL nonreactive; no oligoclonal bands; IgG index 0.56; HSV 1/2 PCR negative; negative cytology; GS and Cx negative; fungal Cx negative   HISTORY: She started having trouble with getting words out in 2023.  Sometimes she cannot understand what other people are telling her.  When she writes, she is unsure if it is correct.  Sometimes she has trouble hearing, such as people knocking on the door.  In February 2024, she began experiencing sudden onset visual and auditory hallucinations.  She started seeing unfamiliar people in the home talking to her, sometimes making demands.  One time, she insisted that somebody was up in her mother's attic.  Another time, her mother saw her talking to the table because she saw somebody sitting there.  She once was holding a blouse because she had to give it to a lady at the back door.  She has history of depression and is followed by her psychiatrist, Dr. Donell Beers, but she never had previous history of psychosis.  She has had a couple of falls but nothing significant.  Initially, she was found to have a UTI, but symptoms have persisted.  She underwent neuropsychological evaluation on 01/27/2023 which revealed diffuse cognitive dysfunction. Specific diagnosis was unclear.  Logopenic primary progressive aphasia possible but testing patterns not consistent with Alzheimer's disease and it would not explain psychosis.  Lewy body disease considered.  Both her son and mother have denied noticing any signs of memory impairment or speech impairment.  She has never exhibited any psychosis in the past.  Her mother and her son have never noted any cognitive deficits prior to this year.    TSH from 07/28/2022 was 1.85.  MRI of brain without contrast on 07/30/2022 personally reviewed revealed atrophy and mildly advanced  chronic small vessel ischemic changes with a 14 mm calcified right frontal lobe meningioma.    Family history:  brother has  Bipolar disorder       PAST  MEDICAL HISTORY: Past Medical History:  Diagnosis Date   AK (actinic keratosis) 10/19/2012   Left shoulder   Attention deficit hyperactivity disorder (ADHD) 06/08/2007   Qualifier: Diagnosis of   By: Mayford Knife, LPN, Domenic Polite   Auditory hallucinations 09/2022   Back pain with radiculopathy 05/28/2009   Basal cell carcinoma (BCC) of back 11/16/2009   Carpal tunnel syndrome    Cellulitis and abscess of trunk 10/11/2021   Chronic bilateral low back pain without sciatica 07/30/2017   Closed fracture of nasal bones 02/13/2019   Colon polyp    Constipation    Dementia, unclear and potentially mixed etiology 01/27/2023   Deviated septum 02/21/2019   Essential hypertension 06/08/2007   Gallstones    Generalized anxiety disorder 07/30/2009   GERD (gastroesophageal reflux disease)    Hyperlipidemia    Hypothyroidism 06/08/2007   S/p RAI treatment   Left hip pain 03/27/2016   Left wrist pain 12/10/2020   Lumbar radiculopathy 03/19/2016   Major depressive disorder 12/04/2022   Meningioma 07/31/2022   14 mm right frontal lobe -seen on MRI 07/2022   Neck pain 01/16/2016   Osteoarthritis 12/20/2007   Overactive bladder 10/05/2020   Pain in joint of left knee 08/09/2020   Pain of right thumb 09/22/2019   Piriformis syndrome of left side 04/29/2016   Prediabetes 07/05/2018   Pseudogout of hand, left 04/29/2022   Pseudogout of left wrist 01/21/2021   Right shoulder pain 02/14/2019   Status post dilation of esophageal narrowing    Thrombosed hemorrhoids 10/17/2008   Visual hallucinations 09/2022   fully-formed    MEDICATIONS: Current Outpatient Medications on File Prior to Visit  Medication Sig Dispense Refill   bisoprolol-hydrochlorothiazide (ZIAC) 5-6.25 MG tablet Take 1 tablet by mouth once daily 90 tablet 0   escitalopram (LEXAPRO) 20 MG tablet Take 1 tablet (20 mg total) by mouth daily. 30 tablet 2   escitalopram (LEXAPRO) 20 MG tablet Take 1 tablet (20 mg total) by mouth daily.  30 tablet 7   escitalopram (LEXAPRO) 20 MG tablet 1  qam 30 tablet 5   Eszopiclone 3 MG TABS Take 1 tablet (3 mg total) by mouth at bedtime as needed. Take immediately before bedtime 30 tablet 5   levothyroxine (SYNTHROID) 112 MCG tablet Take 1 tablet by mouth once daily 90 tablet 0   lurasidone (LATUDA) 80 MG TABS tablet Take 1 tablet (80 mg total) by mouth daily with breakfast. 30 tablet 5   pravastatin (PRAVACHOL) 20 MG tablet Take 1 tablet by mouth once daily 90 tablet 1   Vibegron 75 MG TABS Take 1 tablet (75 mg total) by mouth daily. 30 tablet 5   No current facility-administered medications on file prior to visit.    ALLERGIES: Allergies  Allergen Reactions   Asa [Aspirin] Other (See Comments)    SOB and blocks ears and nose   Augmentin [Amoxicillin-Pot Clavulanate] Nausea And Vomiting   Crestor [Rosuvastatin]     Joint pain   Morphine And Codeine Nausea And Vomiting    FAMILY HISTORY: Family History  Problem Relation Age of Onset   Colon polyps Mother 35   Hyperlipidemia Mother    Colon cancer Mother 25   Lung cancer Father    Mental illness Father    Cancer Paternal Grandfather  mets   Leukemia Maternal Aunt    Esophageal cancer Neg Hx    Rectal cancer Neg Hx    Stomach cancer Neg Hx    Breast cancer Neg Hx       Objective:  Blood pressure 116/66, pulse 71, height 5\' 4"  (1.626 m), weight 178 lb 12.8 oz (81.1 kg), SpO2 94%. General: No acute distress.  Patient appears well-groomed.   Neurological Exam: Alert and oriented.  Speech fluent and not dysarthric.  Language intact.  CN II-XII.  Hypomimia.  Bulk and tone normal.  Muscle strength 5/5.  No tremor.  DTR 2+ throughout.  Gait with upright posture, reduced arm swing and reduced stride.  Romberg negative.     Shon Millet, DO  CC: Cheryll Cockayne, MD

## 2023-04-17 ENCOUNTER — Ambulatory Visit: Payer: PPO | Admitting: Neurology

## 2023-04-17 ENCOUNTER — Encounter: Payer: Self-pay | Admitting: Neurology

## 2023-04-17 VITALS — BP 116/66 | HR 71 | Ht 64.0 in | Wt 178.8 lb

## 2023-04-17 DIAGNOSIS — F02818 Dementia in other diseases classified elsewhere, unspecified severity, with other behavioral disturbance: Secondary | ICD-10-CM

## 2023-04-17 DIAGNOSIS — G309 Alzheimer's disease, unspecified: Secondary | ICD-10-CM | POA: Diagnosis not present

## 2023-04-17 MED ORDER — MEMANTINE HCL 10 MG PO TABS: ORAL_TABLET | ORAL | 0 refills | Status: DC

## 2023-04-17 NOTE — Patient Instructions (Signed)
Start memantine.  Take as directed. Refer to memory clinic at [facility] Follow up in 6 months.

## 2023-04-19 ENCOUNTER — Other Ambulatory Visit: Payer: Self-pay | Admitting: Internal Medicine

## 2023-04-22 ENCOUNTER — Other Ambulatory Visit: Payer: Self-pay | Admitting: Internal Medicine

## 2023-04-28 ENCOUNTER — Ambulatory Visit: Payer: PPO | Admitting: Internal Medicine

## 2023-05-03 DIAGNOSIS — R350 Frequency of micturition: Secondary | ICD-10-CM | POA: Diagnosis not present

## 2023-05-04 ENCOUNTER — Ambulatory Visit: Payer: PPO | Admitting: Internal Medicine

## 2023-05-05 LAB — FUNGUS CULTURE W SMEAR
CULTURE:: NO GROWTH
MICRO NUMBER:: 15270037
SMEAR:: NONE SEEN
SPECIMEN QUALITY:: ADEQUATE

## 2023-05-05 LAB — CNS IGG SYNTHESIS RATE, CSF+BLOOD
Albumin Serum: 4.2 g/dL (ref 3.6–5.1)
Albumin, CSF: 28.6 mg/dL (ref 8.0–42.0)
CNS-IgG Synthesis Rate: 0.1 mg/(24.h) (ref <=3.3)
IgG (Immunoglobin G), Serum: 926 mg/dL (ref 600–1540)
IgG Total CSF: 3.5 mg/dL (ref 0.8–7.7)
IgG-Index: 0.56 (ref <0.70)

## 2023-05-05 LAB — PROTEIN, CSF 14-3-3 (PRION DISEASE)
14-3-3 PROTEIN (CSF)++: 5053 [AU]/mL — ABNORMAL HIGH (ref 173–1999)
EST PROB PRION DIS IN PATIENT: <0.2 %
RT-QUIC (CSF)*: NEGATIVE
T-TAU PROTEIN (CSF)++: 401 pg/mL (ref 0–1149)

## 2023-05-05 LAB — CSF CULTURE W GRAM STAIN
MICRO NUMBER:: 15270038
Result:: NO GROWTH
SPECIMEN QUALITY:: ADEQUATE

## 2023-05-05 LAB — VDRL, CSF: VDRL Quant, CSF: NONREACTIVE

## 2023-05-05 LAB — OLIGOCLONAL BANDS, CSF + SERM: Oligo Bands: ABSENT

## 2023-05-05 LAB — MAYO MISC ORDER 2: PRICE:: 1288

## 2023-05-06 ENCOUNTER — Ambulatory Visit: Payer: PPO

## 2023-05-18 NOTE — Progress Notes (Deleted)
      Subjective:    Patient ID: Paige [name], female    DOB: 08/08/1951, 72 y.o.   MRN: [id number]     HPI Paige [name] is here for follow up of her chronic medical problems.    Medications and allergies reviewed with patient and updated if appropriate.  Current Outpatient Medications on File Prior to Visit  Medication Sig Dispense Refill   bisoprolol-hydrochlorothiazide (ZIAC) 5-6.25 MG tablet Take 1 tablet by mouth once daily 90 tablet 0   escitalopram (LEXAPRO) 20 MG tablet Take 1 tablet (20 mg total) by mouth daily. 30 tablet 2   escitalopram (LEXAPRO) 20 MG tablet Take 1 tablet (20 mg total) by mouth daily. 30 tablet 7   escitalopram (LEXAPRO) 20 MG tablet 1  qam 30 tablet 5   Eszopiclone 3 MG TABS Take 1 tablet (3 mg total) by mouth at bedtime as needed. Take immediately before bedtime 30 tablet 5   levothyroxine (SYNTHROID) 112 MCG tablet Take 1 tablet by mouth once daily 90 tablet 0   lurasidone (LATUDA) 80 MG TABS tablet Take 1 tablet (80 mg total) by mouth daily with breakfast. 30 tablet 5   memantine (NAMENDA) 10 MG tablet Take 1/2 tablet at bedtime for one week, then 1/2 tablet twice daily for one week, then 1 tablet twice daily 60 tablet 0   pravastatin (PRAVACHOL) 20 MG tablet Take 1 tablet by mouth once daily 90 tablet 1   Vibegron 75 MG TABS Take 1 tablet (75 mg total) by mouth daily. 30 tablet 5   No current facility-administered medications on file prior to visit.     Review of Systems     Objective:  There were no vitals filed for this visit. BP Readings from Last 3 Encounters:  04/17/23 116/66  03/18/23 (!) 148/69  02/06/23 (!) 123/51   Wt Readings from Last 3 Encounters:  04/17/23 178 lb 12.8 oz (81.1 kg)  02/17/23 184 lb (83.5 kg)  02/06/23 184 lb (83.5 kg)   There is no height or weight on file to calculate BMI.    Physical Exam     Lab Results  Component Value Date   WBC 8.0 11/26/2022   HGB 12.5 11/26/2022   HCT 37.3 11/26/2022    PLT 230 11/26/2022   GLUCOSE 116 (H) 11/26/2022   CHOL 183 10/27/2022   TRIG 174.0 (H) 10/27/2022   HDL 60.70 10/27/2022   LDLDIRECT 127.0 01/23/2022   LDLCALC 88 10/27/2022   ALT 14 11/26/2022   AST 47 (H) 11/26/2022   NA 138 11/26/2022   K 3.3 (L) 11/26/2022   CL 97 (L) 11/26/2022   CREATININE 0.87 11/26/2022   BUN 20 11/26/2022   CO2 25 11/26/2022   TSH 2.14 10/27/2022   HGBA1C 6.6 (H) 10/27/2022     Assessment & Plan:    See Problem List for Assessment and Plan of chronic medical problems.

## 2023-05-19 ENCOUNTER — Other Ambulatory Visit: Payer: Self-pay | Admitting: Neurology

## 2023-05-19 ENCOUNTER — Ambulatory Visit: Payer: PPO | Admitting: Internal Medicine

## 2023-05-19 DIAGNOSIS — F03B2 Unspecified dementia, moderate, with psychotic disturbance: Secondary | ICD-10-CM

## 2023-05-19 DIAGNOSIS — E782 Mixed hyperlipidemia: Secondary | ICD-10-CM

## 2023-05-19 DIAGNOSIS — R7303 Prediabetes: Secondary | ICD-10-CM

## 2023-05-19 DIAGNOSIS — E039 Hypothyroidism, unspecified: Secondary | ICD-10-CM

## 2023-05-19 DIAGNOSIS — I10 Essential (primary) hypertension: Secondary | ICD-10-CM

## 2023-06-02 ENCOUNTER — Telehealth: Payer: Self-pay | Admitting: Neurology

## 2023-06-02 NOTE — Telephone Encounter (Signed)
Patients Daughter-inlaw is calling with questions about memory, patient is wondering out of the house now

## 2023-06-03 NOTE — Telephone Encounter (Signed)
LMOVM for the daughter to call the office back.

## 2023-06-08 ENCOUNTER — Ambulatory Visit: Payer: PPO | Admitting: Neurology

## 2023-06-10 ENCOUNTER — Ambulatory Visit: Payer: PPO | Admitting: Neurology

## 2023-06-10 ENCOUNTER — Telehealth: Payer: Self-pay | Admitting: Neurology

## 2023-06-10 DIAGNOSIS — G309 Alzheimer's disease, unspecified: Secondary | ICD-10-CM

## 2023-06-10 NOTE — Telephone Encounter (Signed)
Per patient daughter in law,  the patient has gotten worse, She is urinating on herself, more confused, do not want to bath, up all night sleep all day.    [facility] hasn't called patient. Referral re faxed.

## 2023-06-10 NOTE — Telephone Encounter (Signed)
Daughter called upset that the patients appointment was pushed back till march, patient has changed in 2 months and needed to be seen an cannont wait till march

## 2023-06-11 NOTE — Telephone Encounter (Signed)
LMOVm to call the office back.  Per DR.Everlena Cooper,  We can order home health for assistance

## 2023-06-15 ENCOUNTER — Telehealth: Payer: Self-pay | Admitting: Neurology

## 2023-06-15 NOTE — Telephone Encounter (Signed)
Pt's son Timmy brought in [facility] paperwork to be filled out so they can get some help with the patient. Form is in Dr. Moises Blood box

## 2023-06-16 ENCOUNTER — Ambulatory Visit (HOSPITAL_COMMUNITY): Payer: PPO | Admitting: Psychiatry

## 2023-06-17 ENCOUNTER — Other Ambulatory Visit: Payer: Self-pay | Admitting: Obstetrics and Gynecology

## 2023-06-24 DIAGNOSIS — Z0279 Encounter for issue of other medical certificate: Secondary | ICD-10-CM

## 2023-06-30 ENCOUNTER — Encounter (HOSPITAL_COMMUNITY): Payer: Self-pay | Admitting: Psychiatry

## 2023-06-30 ENCOUNTER — Other Ambulatory Visit: Payer: Self-pay

## 2023-06-30 ENCOUNTER — Ambulatory Visit (HOSPITAL_BASED_OUTPATIENT_CLINIC_OR_DEPARTMENT_OTHER): Payer: PPO | Admitting: Psychiatry

## 2023-06-30 VITALS — BP 110/65 | HR 77 | Ht 63.0 in | Wt 178.0 lb

## 2023-06-30 DIAGNOSIS — F323 Major depressive disorder, single episode, severe with psychotic features: Secondary | ICD-10-CM | POA: Diagnosis not present

## 2023-06-30 MED ORDER — LURASIDONE HCL 60 MG PO TABS: 80.0000 mg | ORAL_TABLET | Freq: Every day | ORAL | 5 refills | Status: DC

## 2023-06-30 MED ORDER — ESCITALOPRAM OXALATE 20 MG PO TABS: 20.0000 mg | ORAL_TABLET | Freq: Every day | ORAL | 7 refills | Status: DC

## 2023-06-30 NOTE — Progress Notes (Signed)
Patient ID: PEARLIA GUDGER, female   DOB: 1951/03/19, 72 y.o.   MRN: [id number] Patient ID: MELICA KRUPNICK, female   DOB: Aug 06, 1951, 72 y.o.   MRN: [id number] [facility] MD Progress Note  06/30/2023 4:31 PM Victorino December [place]   Pa time.st Medical History:   MRN:  [id number] Diagnosis major depressi   Today the patient is seen with her son, Timmy.  The patient is not doing well.  She is failing in her functioning.  She is less able to do her basic ADLs.  She is hardly interactive.  She barely answers questions.  I think her dementing process is worsening.  She is still in extensive evaluation including an LP with her neurologist.  The patient patient takes memantine 10 mg twice daily.  Patient is sleeping too much.  She appears very lethargic and energyless.  She has no more complaints of voices or visions.  She does appear distressed.  I believe she is anxious and sad.  The family gives her around-the-clock support.  The son is trying to get her some home health care assistance.  Her specific etiology is not clear and it leaves it to the conclusion that she has Alzheimer's dementia with seems to be more aggressive than normal and think.  She no longer can do any instrumental ADLs.  I suspect she will need assistance in even more basic things like getting dressed.  The patient makes no attempts to elope.  She is not agitated at all. Past Medical History:  Diagnosis Date   AK (actinic keratosis) 10/19/2012   Left shoulder   Attention deficit hyperactivity disorder (ADHD) 06/08/2007   Qualifier: Diagnosis of   By: Mayford Knife, LPN, Domenic Polite   Auditory hallucinations 09/2022   Back pain with radiculopathy 05/28/2009   Basal cell carcinoma (BCC) of back 11/16/2009   Carpal tunnel syndrome    Cellulitis and abscess of trunk 10/11/2021   Chronic bilateral low back pain without sciatica 07/30/2017   Closed fracture of nasal bones 02/13/2019   Colon polyp    Constipation    Dementia, unclear and  potentially mixed etiology 01/27/2023   Deviated septum 02/21/2019   Essential hypertension 06/08/2007   Gallstones    Generalized anxiety disorder 07/30/2009   GERD (gastroesophageal reflux disease)    Hyperlipidemia    Hypothyroidism 06/08/2007   S/p RAI treatment   Left hip pain 03/27/2016   Left wrist pain 12/10/2020   Lumbar radiculopathy 03/19/2016   Major depressive disorder 12/04/2022   Meningioma 07/31/2022   14 mm right frontal lobe -seen on MRI 07/2022   Neck pain 01/16/2016   Osteoarthritis 12/20/2007   Overactive bladder 10/05/2020   Pain in joint of left knee 08/09/2020   Pain of right thumb 09/22/2019   Piriformis syndrome of left side 04/29/2016   Prediabetes 07/05/2018   Pseudogout of hand, left 04/29/2022   Pseudogout of left wrist 01/21/2021   Right shoulder pain 02/14/2019   Status post dilation of esophageal narrowing    Thrombosed hemorrhoids 10/17/2008   Visual hallucinations 09/2022   fully-formed    Past Surgical History:  Procedure Laterality Date   ABDOMINAL HYSTERECTOMY     CARPAL TUNNEL RELEASE Bilateral    CHOLECYSTECTOMY     CLOSED REDUCTION HAND FRACTURE     right hand   COLONOSCOPY  2018   JP-MAC-suprep(exc)-int hems-5 yr recall-   COLONOSCOPY  2013   TA   fx leg     NASAL SINUS SURGERY  POLYPECTOMY  2013   TA   SHOULDER SURGERY     reattac tendon; right shoulder   TIBIA FRACTURE SURGERY     steel rod right leg   TUBAL LIGATION     Family History:  Family History  Problem Relation Age of Onset   Colon polyps Mother 11   Hyperlipidemia Mother    Colon cancer Mother 58   Lung cancer Father    Mental illness Father    Cancer Paternal Grandfather        mets   Leukemia Maternal Aunt    Esophageal cancer Neg Hx    Rectal cancer Neg Hx    Stomach cancer Neg Hx    Breast cancer Neg Hx    Family Psychiatric  History:  Social History:  Social History   Substance and Sexual Activity  Alcohol Use No     Social History    Substance and Sexual Activity  Drug Use No    Social History   Socioeconomic History   Marital status: Widowed    Spouse name: Not on file   Number of children: 2   Years of education: 12   Highest education level: High school graduate  Occupational History   Occupation: Retired    Associate Professor: [employer]    Comment: cafeteria  Tobacco Use   Smoking status: Never   Smokeless tobacco: Never  Vaping Use   Vaping status: Never Used  Substance and Sexual Activity   Alcohol use: No   Drug use: No   Sexual activity: Not Currently  Other Topics Concern   Not on file  Social History Narrative   Works at [employer] - husband died of lung cancer      No regular exercise.   Are you right handed or left handed?    Are you currently employed ? Yes    What is your current occupation? at [employer]      Do you live at home alone? YEs   Who lives with you?    What type of home do you live in: 1 story or 2 story? 1       Social Determinants of Health   Financial Resource Strain: Low Risk  (02/17/2023)   Overall Financial Resource Strain (CARDIA)    Difficulty of Paying Living Expenses: Not very hard  Food Insecurity: No Food Insecurity (02/17/2023)   Hunger Vital Sign    Worried About Running Out of Food in the Last Year: Never true    Ran Out of Food in the Last Year: Never true  Transportation Needs: No Transportation Needs (02/17/2023)   PRAPARE - Administrator, Civil Service (Medical): No    Lack of Transportation (Non-Medical): No  Physical Activity: Inactive (02/17/2023)   Exercise Vital Sign    Days of Exercise per Week: 0 days    Minutes of Exercise per Session: 0 min  Stress: No Stress Concern Present (02/17/2023)   [employer] of Occupational Health - Occupational Stress Questionnaire    Feeling of Stress : Only a little  Social Connections: Socially Isolated (02/17/2023)   Social Connection and  Isolation Panel [NHANES]    Frequency of Communication with Friends and Family: Twice a week    Frequency of Social Gatherings with Friends and Family: Twice a week    Attends Religious Services: Never    Database administrator or Organizations: No    Attends  Club or Organization Meetings: Never    Marital Status: Widowed   Additional Social History:                         Sleep: Fair  Appetite:  Good  Current Medications: Current Outpatient Medications  Medication Sig Dispense Refill   bisoprolol-hydrochlorothiazide (ZIAC) 5-6.25 MG tablet Take 1 tablet by mouth once daily 90 tablet 0   escitalopram (LEXAPRO) 20 MG tablet Take 1 tablet (20 mg total) by mouth daily. 30 tablet 2   escitalopram (LEXAPRO) 20 MG tablet 1  qam 30 tablet 5   escitalopram (LEXAPRO) 20 MG tablet Take 1 tablet (20 mg total) by mouth daily. 30 tablet 7   GEMTESA 75 MG TABS Take 1 tablet by mouth once daily 30 tablet 0   levothyroxine (SYNTHROID) 112 MCG tablet Take 1 tablet by mouth once daily 90 tablet 0   Lurasidone HCl (LATUDA) 60 MG TABS Take 1.5 tablets (90 mg total) by mouth daily with breakfast. 30 tablet 5   memantine (NAMENDA) 10 MG tablet 1 TABLET TWICE DAILY 60 tablet 5   pravastatin (PRAVACHOL) 20 MG tablet Take 1 tablet by mouth once daily 90 tablet 1   No current facility-administered medications for this visit.    Lab Results: No results found for this or any previous visit (from the past 48 hour(s)).  Blood Alcohol level:  No results found for: "ETH"  Physical Findings: AIMS:  , ,  ,  ,    CIWA:    COWS:     Musculoskeletal: Strength & Muscle Tone: within normal limits Gait & Station: normal Patient leans: N/A  Psychiatric Specialty Exam: ROS  Blood pressure 110/65, pulse 77, height 5\' 3"  (1.6 m), weight 178 lb (80.7 kg).Body mass index is 31.53 kg/m.  General Appearance: Fairly Groomed  Patent attorney::  Good  Speech:  Clear and Coherent  Volume:  Normal  Mood:   Euthymic  Affect:  Blunt  Thought Process:  Coherent  Orientation:  Full (Time, Place, and Person)  Thought Content:  WDL  Suicidal Thoughts:  No  Homicidal Thoughts:  No  Memory:  NA  Judgement:  NA  Insight:  Good  Psychomotor Activity:  Decreased  Concentration:  Fair  Recall:  [employer] of Knowledge:Good  Language: Fair  Akathisia:  No  Handed:  Right  AIMS (if indicated):     Assets:  Desire for Improvement  ADL's:  Intact  Cognition: WNL  Sleep:      Treatment Plan Summary: 06/30/2023, 4:31 PM  This patient's diagnosis is that of moderate/severe dementia.  She has a major cognitive disorder.  At this time we will discontinue her Lunesta.  After 1 month she will go ahead and reduce her Latuda from 80 mg down to 60 mg.  She will continue taking 20 mg of Lexapro and continue her Namenda from her neurologist.  I have a sense the family is understanding that this patient will need assistance 24 hours a day.  The patient will see me again back in 2 to 3 months.

## 2023-07-07 ENCOUNTER — Other Ambulatory Visit (HOSPITAL_COMMUNITY): Payer: Self-pay | Admitting: Psychiatry

## 2023-07-10 ENCOUNTER — Other Ambulatory Visit: Payer: Self-pay | Admitting: Internal Medicine

## 2023-07-17 ENCOUNTER — Other Ambulatory Visit: Payer: Self-pay | Admitting: Internal Medicine

## 2023-07-17 ENCOUNTER — Other Ambulatory Visit: Payer: Self-pay | Admitting: Obstetrics and Gynecology

## 2023-07-20 MED ORDER — GEMTESA 75 MG PO TABS: 1.0000 | ORAL_TABLET | Freq: Every day | ORAL | 3 refills | Status: DC

## 2023-07-20 NOTE — Addendum Note (Signed)
Addended by: Selmer Dominion on: 07/20/2023 10:14 AM   Modules accepted: Orders

## 2023-07-30 ENCOUNTER — Other Ambulatory Visit: Payer: Self-pay | Admitting: Internal Medicine

## 2023-08-01 ENCOUNTER — Other Ambulatory Visit: Payer: Self-pay | Admitting: Internal Medicine

## 2023-08-03 NOTE — Patient Instructions (Addendum)
     Flu immunization administered today.     Blood work was ordered.       Medications changes include :   stop bisoprolol-hydrochlorothiazide and start bisoprolol 5 mg daily ( sent to [facility]) -- ok to change this after finishing what she has at home      Return in about 6 months (around 02/02/2024) for Physical Exam.

## 2023-08-03 NOTE — Progress Notes (Signed)
Subjective:    Patient ID: Paige [name], female    DOB: 1951/06/23, 72 y.o.   MRN: [id number]     HPI Paige [name] is here for follow up of her chronic medical problems.  Living with son.    She has no concerns.  Medications and allergies reviewed with patient and updated if appropriate.  Current Outpatient Medications on File Prior to Visit  Medication Sig Dispense Refill   bisoprolol-hydrochlorothiazide (ZIAC) 5-6.25 MG tablet Take 1 tablet by mouth once daily 90 tablet 0   escitalopram (LEXAPRO) 20 MG tablet Take 1 tablet (20 mg total) by mouth daily. 30 tablet 2   escitalopram (LEXAPRO) 20 MG tablet 1  qam 30 tablet 5   escitalopram (LEXAPRO) 20 MG tablet Take 1 tablet (20 mg total) by mouth daily. 30 tablet 7   levothyroxine (SYNTHROID) 112 MCG tablet Take 1 tablet by mouth once daily 90 tablet 0   Lurasidone HCl (LATUDA) 60 MG TABS Take 1.5 tablets (90 mg total) by mouth daily with breakfast. 30 tablet 5   memantine (NAMENDA) 10 MG tablet 1 TABLET TWICE DAILY 60 tablet 5   pravastatin (PRAVACHOL) 20 MG tablet Take 1 tablet by mouth once daily 90 tablet 0   Vibegron (GEMTESA) 75 MG TABS Take 1 tablet (75 mg total) by mouth daily. 90 tablet 3   No current facility-administered medications on file prior to visit.     Review of Systems  Constitutional:  Negative for fever.  Respiratory:  Negative for cough, shortness of breath and wheezing.   Cardiovascular:  Positive for leg swelling (minimal). Negative for chest pain and palpitations.  Neurological:  Positive for light-headedness (occ) and headaches (occ).       Objective:   Vitals:   08/04/23 0933  BP: 104/64  Pulse: (!) 58  Temp: 98 F (36.7 C)  SpO2: 97%   BP Readings from Last 3 Encounters:  08/04/23 104/64  04/17/23 116/66  03/18/23 (!) 148/69   Wt Readings from Last 3 Encounters:  08/04/23 178 lb (80.7 kg)  04/17/23 178 lb 12.8 oz (81.1 kg)  02/17/23 184 lb (83.5 kg)   Body mass index is  31.53 kg/m.    Physical Exam Constitutional:      General: She is not in acute distress.    Appearance: Normal appearance.  HENT:     Head: Normocephalic and atraumatic.  Eyes:     Conjunctiva/sclera: Conjunctivae normal.  Cardiovascular:     Rate and Rhythm: Normal rate and regular rhythm.     Heart sounds: Normal heart sounds.  Pulmonary:     Effort: Pulmonary effort is normal. No respiratory distress.     Breath sounds: Normal breath sounds. No wheezing.  Musculoskeletal:     Cervical back: Neck supple.     Right lower leg: No edema.     Left lower leg: No edema.  Lymphadenopathy:     Cervical: No cervical adenopathy.  Skin:    General: Skin is warm and dry.     Findings: No rash.  Neurological:     Mental Status: She is alert. Mental status is at baseline.  Psychiatric:        Mood and Affect: Mood normal.        Behavior: Behavior normal.          Diabetic Foot Exam - Simple   Simple Foot Form Diabetic Foot exam was performed with the following findings: Yes 08/04/2023 10:07 AM  Visual  Inspection No deformities, no ulcerations, no other skin breakdown bilaterally: Yes See comments: Yes Sensation Testing Intact to touch and monofilament testing bilaterally: Yes Pulse Check Posterior Tibialis and Dorsalis pulse intact bilaterally: Yes Comments Dry skin, onychomycosis b/l      Lab Results  Component Value Date   WBC 8.0 11/26/2022   HGB 12.5 11/26/2022   HCT 37.3 11/26/2022   PLT 230 11/26/2022   GLUCOSE 116 (H) 11/26/2022   CHOL 183 10/27/2022   TRIG 174.0 (H) 10/27/2022   HDL 60.70 10/27/2022   LDLDIRECT 127.0 01/23/2022   LDLCALC 88 10/27/2022   ALT 14 11/26/2022   AST 47 (H) 11/26/2022   NA 138 11/26/2022   K 3.3 (L) 11/26/2022   CL 97 (L) 11/26/2022   CREATININE 0.87 11/26/2022   BUN 20 11/26/2022   CO2 25 11/26/2022   TSH 2.14 10/27/2022   HGBA1C 6.6 (H) 10/27/2022     Assessment & Plan:    See Problem List for Assessment and  Plan of chronic medical problems.

## 2023-08-04 ENCOUNTER — Ambulatory Visit (INDEPENDENT_AMBULATORY_CARE_PROVIDER_SITE_OTHER): Payer: PPO | Admitting: Internal Medicine

## 2023-08-04 ENCOUNTER — Encounter: Payer: Self-pay | Admitting: Internal Medicine

## 2023-08-04 VITALS — BP 104/64 | HR 58 | Temp 98.0°F | Ht 63.0 in | Wt 178.0 lb

## 2023-08-04 DIAGNOSIS — I10 Essential (primary) hypertension: Secondary | ICD-10-CM | POA: Diagnosis not present

## 2023-08-04 DIAGNOSIS — E782 Mixed hyperlipidemia: Secondary | ICD-10-CM

## 2023-08-04 DIAGNOSIS — E039 Hypothyroidism, unspecified: Secondary | ICD-10-CM | POA: Diagnosis not present

## 2023-08-04 DIAGNOSIS — R7303 Prediabetes: Secondary | ICD-10-CM

## 2023-08-04 DIAGNOSIS — E1169 Type 2 diabetes mellitus with other specified complication: Secondary | ICD-10-CM

## 2023-08-04 DIAGNOSIS — Z23 Encounter for immunization: Secondary | ICD-10-CM

## 2023-08-04 DIAGNOSIS — F03B2 Unspecified dementia, moderate, with psychotic disturbance: Secondary | ICD-10-CM | POA: Diagnosis not present

## 2023-08-04 LAB — COMPREHENSIVE METABOLIC PANEL
ALT: 7 U/L (ref 0–35)
AST: 17 U/L (ref 0–37)
Albumin: 4.4 g/dL (ref 3.5–5.2)
Alkaline Phosphatase: 87 U/L (ref 39–117)
BUN: 23 mg/dL (ref 6–23)
CO2: 29 meq/L (ref 19–32)
Calcium: 9.9 mg/dL (ref 8.4–10.5)
Chloride: 101 meq/L (ref 96–112)
Creatinine, Ser: 0.82 mg/dL (ref 0.40–1.20)
GFR: 70.96 mL/min (ref >60.00)
Glucose, Bld: 99 mg/dL (ref 70–99)
Potassium: 3.4 meq/L — ABNORMAL LOW (ref 3.5–5.1)
Sodium: 139 meq/L (ref 135–145)
Total Bilirubin: 0.4 mg/dL (ref 0.2–1.2)
Total Protein: 7.6 g/dL (ref 6.0–8.3)

## 2023-08-04 LAB — LIPID PANEL
Cholesterol: 202 mg/dL — ABNORMAL HIGH (ref 0–200)
HDL: 72.8 mg/dL (ref >39.00)
LDL Cholesterol: 106 mg/dL — ABNORMAL HIGH (ref 0–99)
NonHDL: 128.75
Total CHOL/HDL Ratio: 3
Triglycerides: 113 mg/dL (ref 0.0–149.0)
VLDL: 22.6 mg/dL (ref 0.0–40.0)

## 2023-08-04 LAB — HEMOGLOBIN A1C: Hgb A1c MFr Bld: 6.1 % (ref 4.6–6.5)

## 2023-08-04 LAB — MICROALBUMIN / CREATININE URINE RATIO
Creatinine,U: 147.7 mg/dL
Microalb Creat Ratio: 0.8 mg/g (ref 0.0–30.0)
Microalb, Ur: 1.3 mg/dL (ref 0.0–1.9)

## 2023-08-04 LAB — TSH: TSH: 4.04 u[IU]/mL (ref 0.35–5.50)

## 2023-08-04 MED ORDER — BISOPROLOL FUMARATE 5 MG PO TABS: 5.0000 mg | ORAL_TABLET | Freq: Every day | ORAL | 3 refills | Status: DC

## 2023-08-04 NOTE — Addendum Note (Signed)
Addended by: Karma Ganja on: 08/04/2023 01:45 PM   Modules accepted: Orders

## 2023-08-04 NOTE — Assessment & Plan Note (Addendum)
Chronic Blood pressure well controlled, but on the low side I would anticipate she may have some weight loss in the future and I do worry about her blood pressure being too low Discontinue bisoprolol-HCTZ 5-6.25 mg daily and start bisoprolol 5 mg daily CMP, CBC May need to discontinue BP medication altogether in the near future

## 2023-08-04 NOTE — Assessment & Plan Note (Signed)
Chronic Following with neurology On Namenda There has been a significant decline since I saw her last She lives with her son and when he is at work she stays with her other son apparently Son is managing her medications

## 2023-08-04 NOTE — Assessment & Plan Note (Addendum)
New  With hyperlipidemia   Lab Results  Component Value Date   HGBA1C 6.6 (H) 10/27/2022   Sugars controlled Check A1c, urine microalbumin today Continue lifestyle control-encouraged her to eat a healthy diet. Encouraged low sugar diet

## 2023-08-04 NOTE — Assessment & Plan Note (Signed)
Chronic Regular exercise and healthy diet encouraged Check lipid panel, CMP Continue pravastatin 20 mg daily 

## 2023-08-04 NOTE — Assessment & Plan Note (Deleted)
Chronic Lab Results  Component Value Date   HGBA1C 6.6 (H) 10/27/2022   Check a1c Low sugar / carb diet Stressed regular exercise

## 2023-08-04 NOTE — Assessment & Plan Note (Signed)
Chronic  Clinically euthyroid Check tsh and will titrate med dose if needed Currently taking levothyroxine 112 mcg daily  

## 2023-08-08 ENCOUNTER — Encounter: Payer: Self-pay | Admitting: Emergency Medicine

## 2023-08-08 ENCOUNTER — Other Ambulatory Visit: Payer: Self-pay

## 2023-08-08 ENCOUNTER — Ambulatory Visit
Admission: EM | Admit: 2023-08-08 | Discharge: 2023-08-08 | Disposition: A | Discharge status: Home / Self Care | Payer: PPO | Attending: Physician Assistant | Admitting: Physician Assistant

## 2023-08-08 DIAGNOSIS — R300 Dysuria: Secondary | ICD-10-CM | POA: Diagnosis not present

## 2023-08-08 DIAGNOSIS — F0390 Unspecified dementia without behavioral disturbance: Secondary | ICD-10-CM | POA: Diagnosis not present

## 2023-08-08 LAB — POCT URINALYSIS DIP (MANUAL ENTRY)
Bilirubin, UA: NEGATIVE
Blood, UA: NEGATIVE
Glucose, UA: NEGATIVE mg/dL
Ketones, POC UA: NEGATIVE mg/dL
Leukocytes, UA: NEGATIVE
Nitrite, UA: NEGATIVE
Protein Ur, POC: NEGATIVE mg/dL
Spec Grav, UA: >=1.03 — AB (ref 1.010–1.025)
Urobilinogen, UA: 0.2 U/dL
pH, UA: 5.5 (ref 5.0–8.0)

## 2023-08-08 NOTE — ED Triage Notes (Signed)
Pt here for dysuria x 3 days; pt here with family and has hx of dementia

## 2023-08-08 NOTE — ED Provider Notes (Signed)
[facility]    CSN: [id number] Arrival date & time: 08/08/23  1018      History   Chief Complaint Chief Complaint  Patient presents with   Dysuria    HPI Paige [name] is a 72 y.o. female.   Patient here today for evaluation of dysuria she has had for 3 days.  Her son is here with her and states that she has dementia at baseline.  She has not had any other symptoms that they report.  The history is provided by the patient.  Dysuria Associated symptoms: no fever and no vomiting     Past Medical History:  Diagnosis Date   AK (actinic keratosis) 10/19/2012   Left shoulder   Attention deficit hyperactivity disorder (ADHD) 06/08/2007   Qualifier: Diagnosis of   By: Mayford Knife, LPN, Domenic Polite   Auditory hallucinations 09/2022   Back pain with radiculopathy 05/28/2009   Basal cell carcinoma (BCC) of back 11/16/2009   Carpal tunnel syndrome    Cellulitis and abscess of trunk 10/11/2021   Chronic bilateral low back pain without sciatica 07/30/2017   Closed fracture of nasal bones 02/13/2019   Colon polyp    Constipation    Dementia, unclear and potentially mixed etiology 01/27/2023   Deviated septum 02/21/2019   Essential hypertension 06/08/2007   Gallstones    Generalized anxiety disorder 07/30/2009   GERD (gastroesophageal reflux disease)    Hyperlipidemia    Hypothyroidism 06/08/2007   S/p RAI treatment   Left hip pain 03/27/2016   Left wrist pain 12/10/2020   Lumbar radiculopathy 03/19/2016   Major depressive disorder 12/04/2022   Meningioma 07/31/2022   14 mm right frontal lobe -seen on MRI 07/2022   Neck pain 01/16/2016   Osteoarthritis 12/20/2007   Overactive bladder 10/05/2020   Pain in joint of left knee 08/09/2020   Pain of right thumb 09/22/2019   Piriformis syndrome of left side 04/29/2016   Prediabetes 07/05/2018   Pseudogout of hand, left 04/29/2022   Pseudogout of left wrist 01/21/2021   Right shoulder pain 02/14/2019   Status  post dilation of esophageal narrowing    Thrombosed hemorrhoids 10/17/2008   Visual hallucinations 09/2022   fully-formed    Patient Active Problem List   Diagnosis Date Noted   Dementia, unclear and potentially mixed etiology 01/27/2023   Major depressive disorder 12/04/2022   Visual hallucinations 09/2022   Auditory hallucinations 09/2022   Meningioma 07/31/2022   Pseudogout of hand, left 04/29/2022   Pseudogout of left wrist 01/21/2021   Left wrist pain 12/10/2020   Overactive bladder 10/05/2020   Pain in joint of left knee 08/09/2020   Pain of right thumb 09/22/2019   Deviated septum 02/21/2019   Right shoulder pain 02/14/2019   Diabetes (HCC) 07/05/2018   Chronic bilateral low back pain without sciatica 07/30/2017   Hyperlipidemia 07/04/2017   Piriformis syndrome of left side 04/29/2016   Left hip pain 03/27/2016   Lumbar radiculopathy 03/19/2016   Neck pain 01/16/2016   AK (actinic keratosis) 10/19/2012   Basal cell carcinoma (BCC) of back 11/16/2009   Generalized anxiety disorder 07/30/2009   Back pain with radiculopathy 05/28/2009   Osteoarthritis 12/20/2007   Hypothyroidism 06/08/2007   Attention deficit hyperactivity disorder (ADHD) 06/08/2007   Carpal tunnel syndrome 06/08/2007   Essential hypertension 06/08/2007    Past Surgical History:  Procedure Laterality Date   ABDOMINAL HYSTERECTOMY     CARPAL TUNNEL RELEASE Bilateral    CHOLECYSTECTOMY     CLOSED  REDUCTION HAND FRACTURE     right hand   COLONOSCOPY  2018   JP-MAC-suprep(exc)-int hems-5 yr recall-   COLONOSCOPY  2013   TA   fx leg     NASAL SINUS SURGERY     POLYPECTOMY  2013   TA   SHOULDER SURGERY     reattac tendon; right shoulder   TIBIA FRACTURE SURGERY     steel rod right leg   TUBAL LIGATION      OB History     Gravida  3   Para      Term      Preterm      AB  1   Living  2      SAB  1   IAB      Ectopic      Multiple      Live Births                Home Medications    Prior to Admission medications   Medication Sig Start Date End Date Taking? Authorizing Provider  Eszopiclone 3 MG TABS Take 3 mg by mouth at bedtime. 03/11/23  Yes [provider]  lurasidone (LATUDA) 80 MG TABS tablet Take 80 mg by mouth every morning. 07/27/23  Yes [provider]  bisoprolol (ZEBETA) 5 MG tablet Take 1 tablet (5 mg total) by mouth daily. 08/04/23   Pincus Sanes, MD  escitalopram (LEXAPRO) 20 MG tablet Take 1 tablet (20 mg total) by mouth daily. 06/30/23 06/29/24  Plovsky, Earvin Hansen, MD  levothyroxine (SYNTHROID) 112 MCG tablet Take 1 tablet by mouth once daily 07/20/23   Pincus Sanes, MD  Lurasidone HCl (LATUDA) 60 MG TABS Take 1.5 tablets (90 mg total) by mouth daily with breakfast. 06/30/23   Archer Asa, MD  memantine (NAMENDA) 10 MG tablet 1 TABLET TWICE DAILY 05/22/23   Drema Dallas, DO  pravastatin (PRAVACHOL) 20 MG tablet Take 1 tablet by mouth once daily 08/03/23   Burns, Bobette Mo, MD  Vibegron (GEMTESA) 75 MG TABS Take 1 tablet (75 mg total) by mouth daily. 07/20/23   Selmer Dominion, NP    Family History Family History  Problem Relation Age of Onset   Colon polyps Mother 41   Hyperlipidemia Mother    Colon cancer Mother 68   Lung cancer Father    Mental illness Father    Cancer Paternal Grandfather        mets   Leukemia Maternal Aunt    Esophageal cancer Neg Hx    Rectal cancer Neg Hx    Stomach cancer Neg Hx    Breast cancer Neg Hx     Social History Social History   Tobacco Use   Smoking status: Never   Smokeless tobacco: Never  Vaping Use   Vaping status: Never Used  Substance Use Topics   Alcohol use: No   Drug use: No     Allergies   Asa [aspirin], Amoxicillin-pot clavulanate, Ibuprofen, Morphine, Morphine and codeine, and Rosuvastatin   Review of Systems Review of Systems  Constitutional:  Negative for chills and fever.  Eyes:  Negative for discharge and redness.  Respiratory:   Negative for shortness of breath.   Gastrointestinal:  Negative for vomiting.  Genitourinary:  Positive for dysuria.     Physical Exam Triage Vital Signs ED Triage Vitals  Encounter Vitals Group     BP 08/08/23 1136 (!) 111/51     Systolic BP Percentile --  Diastolic BP Percentile --      Pulse Rate 08/08/23 1136 63     Resp 08/08/23 1136 18     Temp 08/08/23 1136 (!) 97.4 F (36.3 C)     Temp Source 08/08/23 1136 Oral     SpO2 08/08/23 1136 98 %     Weight --      Height --      Head Circumference --      Peak Flow --      Pain Score 08/08/23 1139 0     Pain Loc --      Pain Education --      Exclude from Growth Chart --    No data found.  Updated Vital Signs BP (!) 111/51 (BP Location: Left Arm)   Pulse 63   Temp (!) 97.4 F (36.3 C) (Oral)   Resp 18   SpO2 98%   Visual Acuity Right Eye Distance:   Left Eye Distance:   Bilateral Distance:    Right Eye Near:   Left Eye Near:    Bilateral Near:     Physical Exam Vitals and nursing note reviewed.  Constitutional:      General: She is not in acute distress.    Appearance: Normal appearance. She is not ill-appearing.  HENT:     Head: Normocephalic and atraumatic.  Eyes:     Conjunctiva/sclera: Conjunctivae normal.  Cardiovascular:     Rate and Rhythm: Normal rate.  Pulmonary:     Effort: Pulmonary effort is normal. No respiratory distress.  Neurological:     Mental Status: She is alert.  Psychiatric:        Mood and Affect: Mood normal.        Behavior: Behavior normal.        Thought Content: Thought content normal.      UC Treatments / Results  Labs (all labs ordered are listed, but only abnormal results are displayed) Labs Reviewed  POCT URINALYSIS DIP (MANUAL ENTRY) - Abnormal; Notable for the following components:      Result Value   Clarity, UA cloudy (*)    Spec Grav, UA >=1.030 (*)    All other components within normal limits  URINE CULTURE    EKG   Radiology No results  found.  Procedures Procedures (including critical care time)  Medications Ordered in UC Medications - No data to display  Initial Impression / Assessment and Plan / UC Course  I have reviewed the triage vital signs and the nursing notes.  Pertinent labs & imaging results that were available during my care of the patient were reviewed by me and considered in my medical decision making (see chart for details).    Reassured patient that UA was clear.  Recommended she stay hydrated and will order urine culture to definitively rule out UTI.  Encouraged follow-up if no gradual improvement with any further concerns.  Final Clinical Impressions(s) / UC Diagnoses   Final diagnoses:  Dysuria   Discharge Instructions   None    ED Prescriptions   None    PDMP not reviewed this encounter.   Tomi Bamberger, PA-C 08/08/23 1259

## 2023-08-11 LAB — URINE CULTURE: Culture: NO GROWTH

## 2023-09-02 ENCOUNTER — Ambulatory Visit
Admission: RE | Admit: 2023-09-02 | Discharge: 2023-09-02 | Disposition: A | Discharge status: Home / Self Care | Payer: PPO | Source: Ambulatory Visit | Attending: Neurology | Admitting: Neurology

## 2023-09-02 DIAGNOSIS — I6782 Cerebral ischemia: Secondary | ICD-10-CM | POA: Diagnosis not present

## 2023-09-02 DIAGNOSIS — D320 Benign neoplasm of cerebral meninges: Secondary | ICD-10-CM

## 2023-09-10 ENCOUNTER — Ambulatory Visit: Payer: PPO | Admitting: Neurology

## 2023-10-06 ENCOUNTER — Ambulatory Visit (HOSPITAL_BASED_OUTPATIENT_CLINIC_OR_DEPARTMENT_OTHER): Payer: PPO | Admitting: Psychiatry

## 2023-10-06 DIAGNOSIS — F0393 Unspecified dementia, unspecified severity, with mood disturbance: Secondary | ICD-10-CM | POA: Diagnosis not present

## 2023-10-06 MED ORDER — ESCITALOPRAM OXALATE 20 MG PO TABS: 20.0000 mg | ORAL_TABLET | Freq: Every day | ORAL | 7 refills | Status: DC

## 2023-10-06 MED ORDER — LURASIDONE HCL 60 MG PO TABS: 60.0000 mg | ORAL_TABLET | Freq: Every day | ORAL | 5 refills | Status: DC

## 2023-10-06 NOTE — Progress Notes (Signed)
 Patient ID: Paige [name], female   DOB: [birth year], 73 y.o.   MRN: [id number] Patient ID: Paige [name], female   DOB: 1950-12-31, 73 y.o.   MRN: [id number] [facility] MD Progress Note  10/06/2023 5:33 PM Victorino December Bostelman   Pa time.st Medical History:   MRN:  [id number] Diagnosis major depressi    Today the patient is seen with her son [name].  Fayrene Fearing her other son and him trade-off and staying with the patient all the time.  The patient has moderate to severe dementia at this time.  She is very difficult times with language.  Her short-term memory is severely afflicted.  She still recognizes family.  She is cooperative.  She needs assistance with many of her basic ADLs.  She is still eating okay.  She is not really incontinent.  She makes no attempts to elope.  She is not agitated or violent at all.  On her last visit we made attempts to reduce her Latuda from 80 mg to 60 mg but apparently did not come through.  The patient has had no falls.  She appears very lethargic.  They apparently keep the patient happy she watches TV and family takes care of her.  What is amazing is the patient's mother who is in her 90s is involved in her care.  Her mother still drives and functions well.  The patient on the other hand cannot do any of her institutional ADLs.  The family is attempting to get social services to give them an aid to help them.  The patient's 2 sons 30 and Fayrene Fearing works all the time.  They want some assistance to help them with their mother with the patient.  The family is doing everything they can to keep her in the community. Past Medical History:  Diagnosis Date   AK (actinic keratosis) 10/19/2012   Left shoulder   Attention deficit hyperactivity disorder (ADHD) 06/08/2007   Qualifier: Diagnosis of   By: Mayford Knife, LPN, Domenic Polite   Auditory hallucinations 09/2022   Back pain with radiculopathy 05/28/2009   Basal cell carcinoma (BCC) of back 11/16/2009   Carpal tunnel syndrome    Cellulitis  and abscess of trunk 10/11/2021   Chronic bilateral low back pain without sciatica 07/30/2017   Closed fracture of nasal bones 02/13/2019   Colon polyp    Constipation    Dementia, unclear and potentially mixed etiology 01/27/2023   Deviated septum 02/21/2019   Essential hypertension 06/08/2007   Gallstones    Generalized anxiety disorder 07/30/2009   GERD (gastroesophageal reflux disease)    Hyperlipidemia    Hypothyroidism 06/08/2007   S/p RAI treatment   Left hip pain 03/27/2016   Left wrist pain 12/10/2020   Lumbar radiculopathy 03/19/2016   Major depressive disorder 12/04/2022   Meningioma 07/31/2022   14 mm right frontal lobe -seen on MRI 07/2022   Neck pain 01/16/2016   Osteoarthritis 12/20/2007   Overactive bladder 10/05/2020   Pain in joint of left knee 08/09/2020   Pain of right thumb 09/22/2019   Piriformis syndrome of left side 04/29/2016   Prediabetes 07/05/2018   Pseudogout of hand, left 04/29/2022   Pseudogout of left wrist 01/21/2021   Right shoulder pain 02/14/2019   Status post dilation of esophageal narrowing    Thrombosed hemorrhoids 10/17/2008   Visual hallucinations 09/2022   fully-formed    Past Surgical History:  Procedure Laterality Date   ABDOMINAL HYSTERECTOMY     CARPAL TUNNEL RELEASE Bilateral  CHOLECYSTECTOMY     CLOSED REDUCTION HAND FRACTURE     right hand   COLONOSCOPY  2018   JP-MAC-suprep(exc)-int hems-5 yr recall-   COLONOSCOPY  2013   TA   fx leg     NASAL SINUS SURGERY     POLYPECTOMY  2013   TA   SHOULDER SURGERY     reattac tendon; right shoulder   TIBIA FRACTURE SURGERY     steel rod right leg   TUBAL LIGATION     Family History:  Family History  Problem Relation Age of Onset   Colon polyps Mother 48   Hyperlipidemia Mother    Colon cancer Mother 66   Lung cancer Father    Mental illness Father    Cancer Paternal Grandfather        mets   Leukemia Maternal Aunt    Esophageal cancer Neg Hx    Rectal cancer  Neg Hx    Stomach cancer Neg Hx    Breast cancer Neg Hx    Family Psychiatric  History:  Social History:  Social History   Substance and Sexual Activity  Alcohol Use No     Social History   Substance and Sexual Activity  Drug Use No    Social History   Socioeconomic History   Marital status: Widowed    Spouse name: Not on file   Number of children: 2   Years of education: 12   Highest education level: High school graduate  Occupational History   Occupation: Retired    Associate Professor: [employer]    Comment: cafeteria  Tobacco Use   Smoking status: Never   Smokeless tobacco: Never  Vaping Use   Vaping status: Never Used  Substance and Sexual Activity   Alcohol use: No   Drug use: No   Sexual activity: Not Currently  Other Topics Concern   Not on file  Social History Narrative   Works at [employer] - husband died of lung cancer      No regular exercise.   Are you right handed or left handed?    Are you currently employed ? Yes    What is your current occupation? at [employer]      Do you live at home alone? YEs   Who lives with you?    What type of home do you live in: 1 story or 2 story? 1       Social Drivers of Corporate investment banker Strain: Low Risk  (02/17/2023)   Overall Financial Resource Strain (CARDIA)    Difficulty of Paying Living Expenses: Not very hard  Food Insecurity: No Food Insecurity (02/17/2023)   Hunger Vital Sign    Worried About Running Out of Food in the Last Year: Never true    Ran Out of Food in the Last Year: Never true  Transportation Needs: No Transportation Needs (02/17/2023)   PRAPARE - Administrator, Civil Service (Medical): No    Lack of Transportation (Non-Medical): No  Physical Activity: Inactive (02/17/2023)   Exercise Vital Sign    Days of Exercise per Week: 0 days    Minutes of Exercise per Session: 0 min  Stress: No Stress Concern Present (02/17/2023)    [employer] of Occupational Health - Occupational Stress Questionnaire    Feeling of Stress : Only a little  Social Connections: Socially Isolated (02/17/2023)   Social Connection and Isolation Panel [  NHANES]    Frequency of Communication with Friends and Family: Twice a week    Frequency of Social Gatherings with Friends and Family: Twice a week    Attends Religious Services: Never    Database administrator or Organizations: No    Attends Banker Meetings: Never    Marital Status: Widowed   Additional Social History:                         Sleep: Fair  Appetite:  Good  Current Medications: Current Outpatient Medications  Medication Sig Dispense Refill   bisoprolol (ZEBETA) 5 MG tablet Take 1 tablet (5 mg total) by mouth daily. 90 tablet 3   escitalopram (LEXAPRO) 20 MG tablet Take 1 tablet (20 mg total) by mouth daily. 30 tablet 7   Eszopiclone 3 MG TABS Take 3 mg by mouth at bedtime.     levothyroxine (SYNTHROID) 112 MCG tablet Take 1 tablet by mouth once daily 90 tablet 0   lurasidone (LATUDA) 80 MG TABS tablet Take 80 mg by mouth every morning.     Lurasidone HCl (LATUDA) 60 MG TABS Take 1 tablet (60 mg total) by mouth daily with breakfast. 1 qam 30 tablet 5   memantine (NAMENDA) 10 MG tablet 1 TABLET TWICE DAILY 60 tablet 5   pravastatin (PRAVACHOL) 20 MG tablet Take 1 tablet by mouth once daily 90 tablet 0   Vibegron (GEMTESA) 75 MG TABS Take 1 tablet (75 mg total) by mouth daily. 90 tablet 3   No current facility-administered medications for this visit.    Lab Results: No results found for this or any previous visit (from the past 48 hours).  Blood Alcohol level:  No results found for: "ETH"  Physical Findings: AIMS:  , ,  ,  ,    CIWA:    COWS:     Musculoskeletal: Strength & Muscle Tone: within normal limits Gait & Station: normal Patient leans: N/A  Psychiatric Specialty Exam: ROS  There were no vitals taken for this visit.There  is no height or weight on file to calculate BMI.  General Appearance: Fairly Groomed  Patent attorney::  Good  Speech:  Clear and Coherent  Volume:  Normal  Mood:  Euthymic  Affect:  Blunt  Thought Process:  Coherent  Orientation:  Full (Time, Place, and Person)  Thought Content:  WDL  Suicidal Thoughts:  No  Homicidal Thoughts:  No  Memory:  NA  Judgement:  NA  Insight:  Good  Psychomotor Activity:  Decreased  Concentration:  Fair  Recall:  [employer] of Knowledge:Good  Language: Fair  Akathisia:  No  Handed:  Right  AIMS (if indicated):     Assets:  Desire for Improvement  ADL's:  Intact  Cognition: WNL  Sleep:      Treatment Plan Summary: 10/06/2023, 5:33 PM   This patient's diagnosis is moderate to severe dementia.  She continues getting Namenda from her neurologist.  Today we will clarify again that we should reduce her Latuda from 80 mg down to 60 mg.  The patient physically seems to be fairly stable.  She does not seem to have all that any behavioral disturbances from her Alzheimer's dementia.  She will be seen again in 2 months.  We will probably continue to try to reduce her Latuda.  She shows no evidence of tardive dyskinesia.  She actually is eating and sleeping fairly well.

## 2023-10-23 ENCOUNTER — Encounter: Payer: Self-pay | Admitting: Neurology

## 2023-10-23 ENCOUNTER — Telehealth: Payer: PPO | Admitting: Neurology

## 2023-10-23 DIAGNOSIS — R251 Tremor, unspecified: Secondary | ICD-10-CM

## 2023-10-23 NOTE — Progress Notes (Signed)
 Virtual Visit via Video Note  Consent was obtained for video visit:  Yes.   Answered questions that patient had about telehealth interaction:  Yes.   I discussed the limitations, risks, security and privacy concerns of performing an evaluation and management service by telemedicine. I also discussed with the patient that there may be a patient responsible charge related to this service. The patient expressed understanding and agreed to proceed.  Pt location: Home Physician Location: office Name of referring provider:  Pincus Sanes, MD I connected with Paige [name] at patients initiation/request on 10/23/2023 at  3:30 PM EST by video enabled telemedicine application and verified that I am speaking with the correct person using two identifiers. Pt MRN:  [id number] Pt DOB:  [birth year] Video Participants:  Paige [name];  Tiffany (son's girlfriend - We received verbal consent from her other son on the dpr to speak with her)  Major neurocognitive disorder.  Unclear etiology.  CSF results suggestive of Alzheimer's disease.  However her symptoms and neuropsychological evaluation performance, while not ruled out, not typical for Alzheimer's disease.  Given the prominent hallucinations at onset, Lewy Body dementia is considered.  Autoimmune/paraneolplastic panel was negative. Right frontal lobe meningioma     Memantine 10mg  twice daily Psychiatric care managed by Dr. Donell Beers To evaluate for possible Lewy Body dementia, check DaT scan.  If unremarkable, I would resubmit referral to the memory clinic at [facility] for clarity of her diagnosis. Follow up 6 months.  Total time spent in chart and face to face with patient and Tiffany:  62 minutes.     Subjective:  Paige [name] is a 73 year old right-handed female with ADD, HTN, HLD, anxiety and hypothyroidism who follows up for aphasia and new psychosis.  Psychiatry notes reviewed.  Patient poor historian.  History obtained from  Tiffany.   UPDATE: Current medications:  memantine 10mg  twice daily, Latuda 60mg  daily, excitalopram 20mg  daily, levothyroxine, bisoprolol, vigegron, eszopiclone  She was referred to the memory clinic at [facility] for further evaluation but they never received a call to schedule an appointment.  She continues her psychiatric care with Dr. Donell Beers.  She hasn't been having much hallucinations lately.  She is dependent to perform daily ADLs.  SHe cannot bathe or dress herself.  She wears diapers.  She can use the commode but doesn't properly clean herself.  She is unable to take her medications correctly and are now managed by her family.  She is with somebody from 5 PM until morning.  However, she is alone during the day because her family works.  They are trying to work with social services to get her a Comptroller during the day.  She has had a couple of falls.  Somebody has come home and found her on the floor.  She is unaware of what is going on.  She has trouble explaining herself.  She has been noted to have some tremor in the hand when she is asleep.    HISTORY: She started having trouble with getting words out in 2023.  Sometimes she cannot understand what other people are telling her.  When she writes, she is unsure if it is correct.  Sometimes she has trouble hearing, such as people knocking on the door.  In February 2024, she began experiencing sudden onset visual and auditory hallucinations.  She started seeing unfamiliar people in the home talking to her, sometimes making demands.  One time, she insisted that somebody was up in her  mother's attic.  Another time, her mother saw her talking to the table because she saw somebody sitting there.  She once was holding a blouse because she had to give it to a lady at the back door.  She has history of depression and is followed by her psychiatrist, Dr. Donell Beers, but she never had previous history of psychosis.  She has had a couple of falls but nothing  significant.  Initially, she was found to have a UTI, but symptoms have persisted.  She underwent neuropsychological evaluation on 01/27/2023 which revealed diffuse cognitive dysfunction. Specific diagnosis was unclear.  Logopenic primary progressive aphasia possible but testing patterns not consistent with Alzheimer's disease and it would not explain psychosis.  Lewy body disease considered.  Both her son and mother have denied noticing any signs of memory impairment or speech impairment.  She has never exhibited any psychosis in the past.  Her mother and her son have never noted any cognitive deficits prior to 2024.     TSH from 07/28/2022 was 1.85.  MRI of brain without contrast on 07/30/2022 personally reviewed revealed atrophy and mildly advanced chronic small vessel ischemic changes with a 14 mm calcified right frontal lobe meningioma.    Routine EEG on 02/18/2023 was normal.  Underwent LP on 03/18/2023 for CSF analysis, which revealed elevated p-Tau/Abeta42 of 0.043 and phosho-Tau of 25.8 on the Mayo Alzheimer Disease Evaluation with low Abeta42 595 and normal total-Tau 237; Mayo Autoimmune/Paraneoplastic Dementia panel negative; negative ANA; cell count 1; protein 56; glucose 75; VDRL nonreactive; no oligoclonal bands; IgG index 0.56; HSV 1/2 PCR negative; negative cytology; GS and Cx negative; fungal Cx negative; elevated 14-3-3 of 5053 but negative RT-Quic and normal T-tau progein of 401.     Family history:  brother has  Bipolar disorder  Past Medical History: Past Medical History:  Diagnosis Date   AK (actinic keratosis) 10/19/2012   Left shoulder   Attention deficit hyperactivity disorder (ADHD) 06/08/2007   Qualifier: Diagnosis of   By: Mayford Knife, LPN, Domenic Polite   Auditory hallucinations 09/2022   Back pain with radiculopathy 05/28/2009   Basal cell carcinoma (BCC) of back 11/16/2009   Carpal tunnel syndrome    Cellulitis and abscess of trunk 10/11/2021   Chronic bilateral low back pain  without sciatica 07/30/2017   Closed fracture of nasal bones 02/13/2019   Colon polyp    Constipation    Dementia, unclear and potentially mixed etiology 01/27/2023   Deviated septum 02/21/2019   Essential hypertension 06/08/2007   Gallstones    Generalized anxiety disorder 07/30/2009   GERD (gastroesophageal reflux disease)    Hyperlipidemia    Hypothyroidism 06/08/2007   S/p RAI treatment   Left hip pain 03/27/2016   Left wrist pain 12/10/2020   Lumbar radiculopathy 03/19/2016   Major depressive disorder 12/04/2022   Meningioma 07/31/2022   14 mm right frontal lobe -seen on MRI 07/2022   Neck pain 01/16/2016   Osteoarthritis 12/20/2007   Overactive bladder 10/05/2020   Pain in joint of left knee 08/09/2020   Pain of right thumb 09/22/2019   Piriformis syndrome of left side 04/29/2016   Prediabetes 07/05/2018   Pseudogout of hand, left 04/29/2022   Pseudogout of left wrist 01/21/2021   Right shoulder pain 02/14/2019   Status post dilation of esophageal narrowing    Thrombosed hemorrhoids 10/17/2008   Visual hallucinations 09/2022   fully-formed    Medications: Outpatient Encounter Medications as of 10/23/2023  Medication Sig   bisoprolol (ZEBETA) 5  MG tablet Take 1 tablet (5 mg total) by mouth daily.   escitalopram (LEXAPRO) 20 MG tablet Take 1 tablet (20 mg total) by mouth daily.   Eszopiclone 3 MG TABS Take 3 mg by mouth at bedtime.   levothyroxine (SYNTHROID) 112 MCG tablet Take 1 tablet by mouth once daily   lurasidone (LATUDA) 80 MG TABS tablet Take 80 mg by mouth every morning.   Lurasidone HCl (LATUDA) 60 MG TABS Take 1 tablet (60 mg total) by mouth daily with breakfast. 1 qam   memantine (NAMENDA) 10 MG tablet 1 TABLET TWICE DAILY   pravastatin (PRAVACHOL) 20 MG tablet Take 1 tablet by mouth once daily   Vibegron (GEMTESA) 75 MG TABS Take 1 tablet (75 mg total) by mouth daily.   No facility-administered encounter medications on file as of 10/23/2023.     Allergies: Allergies  Allergen Reactions   Asa [Aspirin] Other (See Comments)    SOB and blocks ears and nose   Amoxicillin-Pot Clavulanate Nausea And Vomiting    Other Reaction(s): Other (See Comments)   Ibuprofen Swelling   Morphine Nausea And Vomiting    Other Reaction(s): Other (See Comments)   Morphine And Codeine Nausea And Vomiting   Rosuvastatin     Joint pain  Other Reaction(s): Muscle Pain    Family History: Family History  Problem Relation Age of Onset   Colon polyps Mother 51   Hyperlipidemia Mother    Colon cancer Mother 99   Lung cancer Father    Mental illness Father    Cancer Paternal Grandfather        mets   Leukemia Maternal Aunt    Esophageal cancer Neg Hx    Rectal cancer Neg Hx    Stomach cancer Neg Hx    Breast cancer Neg Hx     Observations/Objective:   No acute distress.  Alert.   Follow Up Instructions:    -I discussed the assessment and treatment plan with the patient. The patient was provided an opportunity to ask questions and all were answered. The patient agreed with the plan and demonstrated an understanding of the instructions.   The patient was advised to call back or seek an in-person evaluation if the symptoms worsen or if the condition fails to improve as anticipated.   Cira Servant, DO

## 2023-10-23 NOTE — Progress Notes (Signed)
 Verbal consent given by Paige [name]( On DPR) to speak to Paige [name] other sonFayrene [name])  Girlfriend due to no one is available.

## 2023-11-15 ENCOUNTER — Other Ambulatory Visit: Payer: Self-pay | Admitting: Neurology

## 2023-12-04 ENCOUNTER — Encounter (HOSPITAL_COMMUNITY)
Admission: RE | Admit: 2023-12-04 | Discharge: 2023-12-04 | Disposition: A | Discharge status: Home / Self Care | Source: Ambulatory Visit | Attending: Neurology | Admitting: Neurology

## 2023-12-04 DIAGNOSIS — R251 Tremor, unspecified: Secondary | ICD-10-CM | POA: Diagnosis not present

## 2023-12-04 DIAGNOSIS — Z0189 Encounter for other specified special examinations: Secondary | ICD-10-CM | POA: Diagnosis not present

## 2023-12-04 MED ORDER — IOFLUPANE I 123 185 MBQ/2.5ML IV SOLN
4.4500 | Freq: Once | INTRAVENOUS | Status: AC | PRN
Start: 2023-12-04 — End: 2023-12-04
  Administered 2023-12-04: 4.45 via INTRAVENOUS
  Filled 2023-12-04: qty 5

## 2023-12-04 MED ORDER — POTASSIUM IODIDE (ANTIDOTE) 130 MG PO TABS
ORAL_TABLET | ORAL | Status: AC
  Filled 2023-12-04: qty 1

## 2023-12-09 NOTE — Progress Notes (Signed)
 LMOVM to call the office [place].

## 2023-12-14 ENCOUNTER — Telehealth: Payer: Self-pay

## 2023-12-14 DIAGNOSIS — F02818 Dementia in other diseases classified elsewhere, unspecified severity, with other behavioral disturbance: Secondary | ICD-10-CM

## 2023-12-14 NOTE — Telephone Encounter (Signed)
 Per Dr.jaffe, Brain scan that may be suggestive for a parkinson-type disease is normal.  I would like to refer to the memory clinic at [facility] for a more definitive diagnosis of her dementia.       Patient daughter in law advised. Referral sent to [phone]

## 2023-12-15 ENCOUNTER — Ambulatory Visit (HOSPITAL_BASED_OUTPATIENT_CLINIC_OR_DEPARTMENT_OTHER): Payer: PPO | Admitting: Psychiatry

## 2023-12-15 VITALS — BP 129/72 | HR 71 | Ht 62.0 in

## 2023-12-15 DIAGNOSIS — F03918 Unspecified dementia, unspecified severity, with other behavioral disturbance: Secondary | ICD-10-CM

## 2023-12-15 MED ORDER — LURASIDONE HCL 40 MG PO TABS
ORAL_TABLET | ORAL | 5 refills | Status: DC
Start: 2023-12-15 — End: 2024-03-15

## 2023-12-15 MED ORDER — ESCITALOPRAM OXALATE 10 MG PO TABS: 10.0000 mg | ORAL_TABLET | Freq: Every day | ORAL | 7 refills | Status: DC

## 2023-12-15 NOTE — Progress Notes (Signed)
 Patient ID: Paige [name], female   DOB: August 13, 1951, 73 y.o.   MRN: [id number] Patient ID: Paige [name], female   DOB: 1951-02-15, 73 y.o.   MRN: [id number] [facility] MD Progress Note  12/15/2023 3:56 PM Paige Crock [place]   Pa time.st Medical History:   MRN:  [id number] Diagnosis major depressi        Today the patient is seen with her son Paige [name].  The patient is doing fair at her baseline which is that she is demonstrating moderate to severe dementia.  She still recognizes her family but uses language very little.  She still managed to make most of her needs known.  She is still eating and gaining weight.  She is sleeping fairly well.  She is not agitated she makes no attempts to elope.  She needs assistance to dress.  She is having some problems with the bathroom but for the most part she is able to take care of most of those needs.  She shows no evidence of psychosis.  Today we reviewed with him her son the side effect of tardive dyskinesia and the potential of it occurring with her Latuda .  Today she had an aims scale that showed no evidence of tardive dyskinesia.  The patient is functioning reasonably well requiring assistance 24 hours a day.  Today we given the name of the service to continue some in-home health care.  The patient's children and themselves are trying to get social services to be involved daycare for this patient.  The possibility of a daycare program for this patient should also be considered.  Right now she is not a behavioral disturbance at all. Past Medical History:  Diagnosis Date   AK (actinic keratosis) 10/19/2012   Left shoulder   Attention deficit hyperactivity disorder (ADHD) 06/08/2007   Qualifier: Diagnosis of   By: Paige Canes, LPN, Paige [name]   Auditory hallucinations 09/2022   Back pain with radiculopathy 05/28/2009   Basal cell carcinoma (BCC) of back 11/16/2009   Carpal tunnel syndrome    Cellulitis and abscess of trunk 10/11/2021   Chronic bilateral low  back pain without sciatica 07/30/2017   Closed fracture of nasal bones 02/13/2019   Colon polyp    Constipation    Dementia, unclear and potentially mixed etiology 01/27/2023   Deviated septum 02/21/2019   Essential hypertension 06/08/2007   Gallstones    Generalized anxiety disorder 07/30/2009   GERD (gastroesophageal reflux disease)    Hyperlipidemia    Hypothyroidism 06/08/2007   S/p RAI treatment   Left hip pain 03/27/2016   Left wrist pain 12/10/2020   Lumbar radiculopathy 03/19/2016   Major depressive disorder 12/04/2022   Meningioma 07/31/2022   14 mm right frontal lobe -seen on MRI 07/2022   Neck pain 01/16/2016   Osteoarthritis 12/20/2007   Overactive bladder 10/05/2020   Pain in joint of left knee 08/09/2020   Pain of right thumb 09/22/2019   Piriformis syndrome of left side 04/29/2016   Prediabetes 07/05/2018   Pseudogout of hand, left 04/29/2022   Pseudogout of left wrist 01/21/2021   Right shoulder pain 02/14/2019   Status post dilation of esophageal narrowing    Thrombosed hemorrhoids 10/17/2008   Visual hallucinations 09/2022   fully-formed    Past Surgical History:  Procedure Laterality Date   ABDOMINAL HYSTERECTOMY     CARPAL TUNNEL RELEASE Bilateral    CHOLECYSTECTOMY     CLOSED REDUCTION HAND FRACTURE     right hand   COLONOSCOPY  2018   JP-MAC-suprep(exc)-int hems-5 yr recall-   COLONOSCOPY  2013   TA   fx leg     NASAL SINUS SURGERY     POLYPECTOMY  2013   TA   SHOULDER SURGERY     reattac tendon; right shoulder   TIBIA FRACTURE SURGERY     steel rod right leg   TUBAL LIGATION     Family History:  Family History  Problem Relation Age of Onset   Colon polyps Mother 50   Hyperlipidemia Mother    Colon cancer Mother 67   Lung cancer Father    Mental illness Father    Cancer Paternal Grandfather        mets   Leukemia Maternal Aunt    Esophageal cancer Neg Hx    Rectal cancer Neg Hx    Stomach cancer Neg Hx    Breast cancer Neg Hx     Family Psychiatric  History:  Social History:  Social History   Substance and Sexual Activity  Alcohol Use No     Social History   Substance and Sexual Activity  Drug Use No    Social History   Socioeconomic History   Marital status: Widowed    Spouse name: Not on file   Number of children: 2   Years of education: 12   Highest education level: High school graduate  Occupational History   Occupation: Retired    Associate Professor: [employer]    Comment: cafeteria  Tobacco Use   Smoking status: Never   Smokeless tobacco: Never  Vaping Use   Vaping status: Never Used  Substance and Sexual Activity   Alcohol use: No   Drug use: No   Sexual activity: Not Currently  Other Topics Concern   Not on file  Social History Narrative   Works at [employer] - husband died of lung cancer      No regular exercise.   Are you right handed or left handed?    Are you currently employed ? Yes    What is your current occupation? at [employer]      Do you live at home alone? YEs   Who lives with you?    What type of home do you live in: 1 story or 2 story? 1       Social Drivers of Corporate investment banker Strain: Low Risk  (02/17/2023)   Overall Financial Resource Strain (CARDIA)    Difficulty of Paying Living Expenses: Not very hard  Food Insecurity: No Food Insecurity (02/17/2023)   Hunger Vital Sign    Worried About Running Out of Food in the Last Year: Never true    Ran Out of Food in the Last Year: Never true  Transportation Needs: No Transportation Needs (02/17/2023)   PRAPARE - Administrator, Civil Service (Medical): No    Lack of Transportation (Non-Medical): No  Physical Activity: Inactive (02/17/2023)   Exercise Vital Sign    Days of Exercise per Week: 0 days    Minutes of Exercise per Session: 0 min  Stress: No Stress Concern Present (02/17/2023)   [employer] of Occupational Health - Occupational Stress  Questionnaire    Feeling of Stress : Only a little  Social Connections: Socially Isolated (02/17/2023)   Social Connection and Isolation Panel [NHANES]    Frequency of Communication with Friends and Family: Twice a week    Frequency of  Social Gatherings with Friends and Family: Twice a week    Attends Religious Services: Never    Database administrator or Organizations: No    Attends Banker Meetings: Never    Marital Status: Widowed   Additional Social History:                         Sleep: Fair  Appetite:  Good  Current Medications: Current Outpatient Medications  Medication Sig Dispense Refill   lurasidone  (LATUDA ) 40 MG TABS tablet 1 qhs 30 tablet 5   bisoprolol  (ZEBETA ) 5 MG tablet Take 1 tablet (5 mg total) by mouth daily. 90 tablet 3   escitalopram  (LEXAPRO ) 10 MG tablet Take 1 tablet (10 mg total) by mouth daily. 1 qam 30 tablet 7   Eszopiclone  3 MG TABS Take 3 mg by mouth at bedtime.     levothyroxine  (SYNTHROID ) 112 MCG tablet Take 1 tablet by mouth once daily 90 tablet 0   memantine  (NAMENDA ) 10 MG tablet Take 1 tablet by mouth twice daily 60 tablet 5   pravastatin  (PRAVACHOL ) 20 MG tablet Take 1 tablet by mouth once daily 90 tablet 0   Vibegron  (GEMTESA ) 75 MG TABS Take 1 tablet (75 mg total) by mouth daily. 90 tablet 3   No current facility-administered medications for this visit.    Lab Results: No results found for this or any previous visit (from the past 48 hours).  Blood Alcohol level:  No results found for: "ETH"  Physical Findings: AIMS:  , ,  ,  ,    CIWA:    COWS:     Musculoskeletal: Strength & Muscle Tone: within normal limits Gait & Station: normal Patient leans: N/A  Psychiatric Specialty Exam: ROS  Blood pressure 129/72, pulse 71, height 5\' 2"  (1.575 m).Body mass index is 32.56 kg/m.  General Appearance: Fairly Groomed  Patent attorney::  Good  Speech:  Clear and Coherent  Volume:  Normal  Mood:  Euthymic  Affect:   Blunt  Thought Process:  Coherent  Orientation:  Full (Time, Place, and Person)  Thought Content:  WDL  Suicidal Thoughts:  No  Homicidal Thoughts:  No  Memory:  NA  Judgement:  NA  Insight:  Good  Psychomotor Activity:  Decreased  Concentration:  Fair  Recall:  [employer] of Knowledge:Good  Language: Fair  Akathisia:  No  Handed:  Right  AIMS (if indicated):     Assets:  Desire for Improvement  ADL's:  Intact  Cognition: WNL  Sleep:      Treatment Plan Summary: 12/15/2023, 3:56 PM   This patient's diagnosis is moderate to severe dementia and neurocognitive disorder with psychotic symptoms and now has abated.  Today we will proceed to reduce her Latuda  from 60 mg to 40 mg with plans over the next 6 months to probably discontinue it.  Will reduce her Lexapro  to a more appropriate dose of 10 mg.  The patient will continue taking Namenda  from her neurologist.  Patient to be seen again in 3 months.

## 2024-01-10 ENCOUNTER — Other Ambulatory Visit: Payer: Self-pay | Admitting: Internal Medicine

## 2024-02-02 ENCOUNTER — Encounter: Payer: PPO | Admitting: Internal Medicine

## 2024-02-15 NOTE — Progress Notes (Unsigned)
 Subjective:    Patient ID: Paige [name], female    DOB: [birth year], 73 y.o.   MRN: [id number]      HPI Paige [name] is here for a Physical exam and her chronic medical problems.   Here alone.  Poor historian.  No concerns.    Medications and allergies reviewed with patient and updated if appropriate.  Current Outpatient Medications on File Prior to Visit  Medication Sig Dispense Refill   bisoprolol  (ZEBETA ) 5 MG tablet Take 1 tablet (5 mg total) by mouth daily. 90 tablet 3   Eszopiclone  3 MG TABS Take 3 mg by mouth at bedtime.     levothyroxine  (SYNTHROID ) 112 MCG tablet Take 1 tablet by mouth once daily 90 tablet 0   memantine  (NAMENDA ) 10 MG tablet Take 1 tablet by mouth twice daily 60 tablet 5   pravastatin  (PRAVACHOL ) 20 MG tablet Take 1 tablet by mouth once daily 90 tablet 0   Vibegron  (GEMTESA ) 75 MG TABS Take 1 tablet (75 mg total) by mouth daily. 90 tablet 3   escitalopram  (LEXAPRO ) 10 MG tablet Take 1 tablet (10 mg total) by mouth daily. 1 qam 30 tablet 7   escitalopram  (LEXAPRO ) 20 MG tablet Take 20 mg by mouth daily.     lurasidone  (LATUDA ) 40 MG TABS tablet 1 qhs 30 tablet 5   No current facility-administered medications on file prior to visit.    Review of Systems  Constitutional:  Negative for appetite change and fever.  Eyes:  Positive for visual disturbance (sometimes).  Respiratory:  Positive for cough (today) and shortness of breath (occ). Negative for wheezing.   Cardiovascular:  Positive for palpitations (occ) and leg swelling. Negative for chest pain.  Gastrointestinal:  Negative for abdominal pain, blood in stool, constipation and diarrhea (loose stool).       Occ gerd  Genitourinary:  Negative for difficulty urinating.  Musculoskeletal:  Positive for arthralgias.  Skin:  Negative for rash.  Neurological:  Positive for dizziness (sometimes) and headaches.  Psychiatric/Behavioral:  Positive for confusion, dysphoric mood and hallucinations  (sometimes). Negative for sleep disturbance. The patient is nervous/anxious.        Objective:   Vitals:   02/16/24 1451  BP: 118/62  Pulse: 60  Temp: 98 F (36.7 C)  SpO2: 94%   Filed Weights   02/16/24 1451  Weight: 195 lb (88.5 kg)   Body mass index is 35.67 kg/m.  BP Readings from Last 3 Encounters:  02/16/24 118/62  08/08/23 (!) 111/51  08/04/23 104/64    Wt Readings from Last 3 Encounters:  02/16/24 195 lb (88.5 kg)  08/04/23 178 lb (80.7 kg)  04/17/23 178 lb 12.8 oz (81.1 kg)       Physical Exam Constitutional: She appears well-developed and well-nourished. No distress.  HENT:  Head: Normocephalic and atraumatic.  Right Ear: External ear normal. Normal ear canal and TM Left Ear: External ear normal.  Normal ear canal and TM Mouth/Throat: Oropharynx is clear and moist.  Eyes: Conjunctivae normal.  Neck: Neck supple. No tracheal deviation present. No thyromegaly present.  No carotid bruit  Cardiovascular: Normal rate, regular rhythm and normal heart sounds.   1 + edema b/l LE.  no murmur heard.  Pulmonary/Chest: Effort normal and breath sounds normal. No respiratory distress. She has no wheezes. She has no rales.  Breast: deferred   Abdominal: Soft. She exhibits no distension. There is no tenderness.  Lymphadenopathy: She has no cervical adenopathy.  Neuro:  mild  aphasia, blunted affect  Skin: Skin is warm and dry. She is not diaphoretic.  Psychiatric: She has a depressed mood and affect. Her behavior is normal.     Lab Results  Component Value Date   WBC 8.0 11/26/2022   HGB 12.5 11/26/2022   HCT 37.3 11/26/2022   PLT 230 11/26/2022   GLUCOSE 99 08/04/2023   CHOL 202 (H) 08/04/2023   TRIG 113.0 08/04/2023   HDL 72.80 08/04/2023   LDLDIRECT 127.0 01/23/2022   LDLCALC 106 (H) 08/04/2023   ALT 7 08/04/2023   AST 17 08/04/2023   NA 139 08/04/2023   K 3.4 (L) 08/04/2023   CL 101 08/04/2023   CREATININE 0.82 08/04/2023   BUN 23 08/04/2023    CO2 29 08/04/2023   TSH 4.04 08/04/2023   HGBA1C 6.1 08/04/2023         Assessment & Plan:   Physical exam: Screening blood work  ordered Exercise  none Weight  obese Substance abuse  none   Reviewed recommended immunizations.   Health Maintenance  Topic Date Due   OPHTHALMOLOGY EXAM  Never done   Diabetic kidney evaluation - Urine ACR  Never done   Zoster Vaccines- Shingrix (1 of 2) Never done   MAMMOGRAM  03/18/2023   COVID-19 Vaccine (5 - 2024-25 season) 04/19/2023   HEMOGLOBIN A1C  02/02/2024   Medicare Annual Wellness (AWV)  02/17/2024   INFLUENZA VACCINE  03/18/2024   Diabetic kidney evaluation - eGFR measurement  08/03/2024   FOOT EXAM  08/03/2024   DEXA SCAN  09/04/2025   Colonoscopy  01/11/2027   DTaP/Tdap/Td (3 - Td or Tdap) 02/09/2029   Pneumococcal Vaccine: 50+ Years  Completed   Hepatitis C Screening  Completed   Hepatitis B Vaccines  Aged Out   HPV VACCINES  Aged Out   Meningococcal B Vaccine  Aged Out          See Problem List for Assessment and Plan of chronic medical problems.

## 2024-02-15 NOTE — Patient Instructions (Addendum)
 Blood work was ordered.       Medications changes include :   None   For your leg swelling - elevate your legs when sitting, try to get up and walk during the day and limit salt intake.      Return in about 6 months (around 08/18/2024) for follow up.    Health Maintenance, Female Adopting a healthy lifestyle and getting preventive care are important in promoting health and wellness. Ask your health care provider about: The right schedule for you to have regular tests and exams. Things you can do on your own to prevent diseases and keep yourself healthy. What should I know about diet, weight, and exercise? Eat a healthy diet  Eat a diet that includes plenty of vegetables, fruits, low-fat dairy products, and lean protein. Do not eat a lot of foods that are high in solid fats, added sugars, or sodium. Maintain a healthy weight Body mass index (BMI) is used to identify weight problems. It estimates body fat based on height and weight. Your health care provider can help determine your BMI and help you achieve or maintain a healthy weight. Get regular exercise Get regular exercise. This is one of the most important things you can do for your health. Most adults should: Exercise for at least 150 minutes each week. The exercise should increase your heart rate and make you sweat (moderate-intensity exercise). Do strengthening exercises at least twice a week. This is in addition to the moderate-intensity exercise. Spend less time sitting. Even light physical activity can be beneficial. Watch cholesterol and blood lipids Have your blood tested for lipids and cholesterol at 74 years of age, then have this test every 5 years. Have your cholesterol levels checked more often if: Your lipid or cholesterol levels are high. You are older than 74 years of age. You are at high risk for heart disease. What should I know about cancer screening? Depending on your health history and family  history, you may need to have cancer screening at various ages. This may include screening for: Breast cancer. Cervical cancer. Colorectal cancer. Skin cancer. Lung cancer. What should I know about heart disease, diabetes, and high blood pressure? Blood pressure and heart disease High blood pressure causes heart disease and increases the risk of stroke. This is more likely to develop in people who have high blood pressure readings or are overweight. Have your blood pressure checked: Every 3-5 years if you are 60-17 years of age. Every year if you are 59 years old or older. Diabetes Have regular diabetes screenings. This checks your fasting blood sugar level. Have the screening done: Once every three years after age 76 if you are at a normal weight and have a low risk for diabetes. More often and at a younger age if you are overweight or have a high risk for diabetes. What should I know about preventing infection? Hepatitis B If you have a higher risk for hepatitis B, you should be screened for this virus. Talk with your health care provider to find out if you are at risk for hepatitis B infection. Hepatitis C Testing is recommended for: Everyone born from [birth year] through 1966. Anyone with known risk factors for hepatitis C. Sexually transmitted infections (STIs) Get screened for STIs, including gonorrhea and chlamydia, if: You are sexually active and are younger than 74 years of age. You are older than 74 years of age and your health care provider tells you that you are  at risk for this type of infection. Your sexual activity has changed since you were last screened, and you are at increased risk for chlamydia or gonorrhea. Ask your health care provider if you are at risk. Ask your health care provider about whether you are at high risk for HIV. Your health care provider may recommend a prescription medicine to help prevent HIV infection. If you choose to take medicine to prevent HIV, you  should first get tested for HIV. You should then be tested every 3 months for as long as you are taking the medicine. Pregnancy If you are about to stop having your period (premenopausal) and you may become pregnant, seek counseling before you get pregnant. Take 400 to 800 micrograms (mcg) of folic acid every day if you become pregnant. Ask for birth control (contraception) if you want to prevent pregnancy. Osteoporosis and menopause Osteoporosis is a disease in which the bones lose minerals and strength with aging. This can result in bone fractures. If you are 75 years old or older, or if you are at risk for osteoporosis and fractures, ask your health care provider if you should: Be screened for bone loss. Take a calcium  or vitamin D  supplement to lower your risk of fractures. Be given hormone replacement therapy (HRT) to treat symptoms of menopause. Follow these instructions at home: Alcohol use Do not drink alcohol if: Your health care provider tells you not to drink. You are pregnant, may be pregnant, or are planning to become pregnant. If you drink alcohol: Limit how much you have to: 0-1 drink a day. Know how much alcohol is in your drink. In the [place] one drink equals one 12 oz bottle of beer (355 mL), one 5 oz glass of wine (148 mL), or one 1 oz glass of hard liquor (44 mL). Lifestyle Do not use any products that contain nicotine or tobacco. These products include cigarettes, chewing tobacco, and vaping devices, such as e-cigarettes. If you need help quitting, ask your health care provider. Do not use street drugs. Do not share needles. Ask your health care provider for help if you need support or information about quitting drugs. General instructions Schedule regular health, dental, and eye exams. Stay current with your vaccines. Tell your health care provider if: You often feel depressed. You have ever been abused or do not feel safe at home. Summary Adopting a healthy  lifestyle and getting preventive care are important in promoting health and wellness. Follow your health care provider's instructions about healthy diet, exercising, and getting tested or screened for diseases. Follow your health care provider's instructions on monitoring your cholesterol and blood pressure. This information is not intended to replace advice given to you by your health care provider. Make sure you discuss any questions you have with your health care provider. Document Revised: 12/24/2020 Document Reviewed: 12/24/2020 Elsevier Patient Education  2024 [employer].

## 2024-02-16 ENCOUNTER — Encounter: Payer: Self-pay | Admitting: Internal Medicine

## 2024-02-16 ENCOUNTER — Ambulatory Visit (INDEPENDENT_AMBULATORY_CARE_PROVIDER_SITE_OTHER): Admitting: Internal Medicine

## 2024-02-16 VITALS — BP 118/62 | HR 60 | Temp 98.0°F | Ht 62.0 in | Wt 195.0 lb

## 2024-02-16 DIAGNOSIS — F411 Generalized anxiety disorder: Secondary | ICD-10-CM | POA: Diagnosis not present

## 2024-02-16 DIAGNOSIS — F329 Major depressive disorder, single episode, unspecified: Secondary | ICD-10-CM | POA: Diagnosis not present

## 2024-02-16 DIAGNOSIS — R600 Localized edema: Secondary | ICD-10-CM | POA: Insufficient documentation

## 2024-02-16 DIAGNOSIS — I10 Essential (primary) hypertension: Secondary | ICD-10-CM

## 2024-02-16 DIAGNOSIS — E782 Mixed hyperlipidemia: Secondary | ICD-10-CM

## 2024-02-16 DIAGNOSIS — E039 Hypothyroidism, unspecified: Secondary | ICD-10-CM | POA: Diagnosis not present

## 2024-02-16 DIAGNOSIS — Z0001 Encounter for general adult medical examination with abnormal findings: Secondary | ICD-10-CM

## 2024-02-16 DIAGNOSIS — E1169 Type 2 diabetes mellitus with other specified complication: Secondary | ICD-10-CM

## 2024-02-16 DIAGNOSIS — Z0000 Encounter for general adult medical examination without abnormal findings: Secondary | ICD-10-CM

## 2024-02-16 MED ORDER — PRAVASTATIN SODIUM 20 MG PO TABS: 20.0000 mg | ORAL_TABLET | Freq: Every day | ORAL | 0 refills | Status: DC

## 2024-02-16 NOTE — Assessment & Plan Note (Signed)
 New Advised to try to walk more, elevate legs when sitting, limit salt intake Check labs Would like to avoid diuretic if possible

## 2024-02-16 NOTE — Assessment & Plan Note (Signed)
 Chronic  Clinically euthyroid Check tsh and will titrate med dose if needed Currently taking levothyroxine 112 mcg daily

## 2024-02-16 NOTE — Assessment & Plan Note (Signed)
 Chronic Blood pressure well controlled Continue bisoprolol  5 mg daily CMP, CBC

## 2024-02-16 NOTE — Assessment & Plan Note (Signed)
Chronic Management per Dr. Casimiro Needle On Lexapro 20 mg daily

## 2024-02-16 NOTE — Assessment & Plan Note (Signed)
 chronic  With hyperlipidemia   Lab Results  Component Value Date   HGBA1C 6.1 08/04/2023   Sugars controlled Check A1c, urine microalbumin today Continue lifestyle control-encouraged her to eat a healthy diet. Encouraged low sugar diet

## 2024-02-16 NOTE — Assessment & Plan Note (Signed)
 Chronic Regular exercise and healthy diet encouraged Check lipid panel, CMP Continue pravastatin  20 mg daily -? Taking - has not been filled in a while

## 2024-02-16 NOTE — Assessment & Plan Note (Signed)
Chronic Management per Dr. Casimiro Needle

## 2024-02-22 ENCOUNTER — Ambulatory Visit (INDEPENDENT_AMBULATORY_CARE_PROVIDER_SITE_OTHER): Payer: PPO

## 2024-02-22 VITALS — Ht 62.0 in | Wt 195.0 lb

## 2024-02-22 DIAGNOSIS — Z0000 Encounter for general adult medical examination without abnormal findings: Secondary | ICD-10-CM

## 2024-02-22 DIAGNOSIS — Z1231 Encounter for screening mammogram for malignant neoplasm of breast: Secondary | ICD-10-CM | POA: Diagnosis not present

## 2024-02-22 DIAGNOSIS — Z0100 Encounter for examination of eyes and vision without abnormal findings: Secondary | ICD-10-CM

## 2024-02-22 NOTE — Patient Instructions (Signed)
 Paige [name] , Thank you for taking time out of your busy schedule to complete your Annual Wellness Visit with me. I enjoyed our conversation and look forward to speaking with you again next year. I, as well as your care team,  appreciate your ongoing commitment to your health goals. Please review the following plan we discussed and let me know if I can assist you in the future. Your Game plan/ To Do List    Referrals: If you haven't heard from the office you've been referred to, please reach out to them at the phone provided.  Son was informed of labwork ordered by PCP; Referral to [facility] for a Mammogram; Referral to [facility] for an eye exam Follow up Visits: Next Medicare AWV with our clinical staff: 02/27/2025   Have you seen your provider in the last 6 months (3 months if uncontrolled diabetes)? Yes Next Office Visit with your provider: 08/19/2024  Clinician Recommendations:  Aim for 30 minutes of exercise or brisk walking, 6-8 glasses of water, and 5 servings of fruits and vegetables each day. Educated and advised Son of the patient on getting a Shingles vaccines in 2025.      This is a list of the screening recommended for you and due dates:  Health Maintenance  Topic Date Due   Eye exam for diabetics  Never done   Yearly kidney health urinalysis for diabetes  Never done   Zoster (Shingles) Vaccine (1 of 2) Never done   Mammogram  03/18/2023   COVID-19 Vaccine (5 - 2024-25 season) 04/19/2023   Hemoglobin A1C  02/02/2024   Flu Shot  03/18/2024   Yearly kidney function blood test for diabetes  08/03/2024   Complete foot exam   08/03/2024   Medicare Annual Wellness Visit  02/21/2025   DEXA scan (bone density measurement)  09/04/2025   Colon Cancer Screening  01/11/2027   DTaP/Tdap/Td vaccine (3 - Td or Tdap) 02/09/2029   Pneumococcal Vaccine for age over 24  Completed   Hepatitis C Screening  Completed   Hepatitis B Vaccine  Aged Out   HPV Vaccine  Aged Out    Meningitis B Vaccine  Aged Out    Advanced directives: (Declined) Advance directive discussed with you today. Even though you declined this today, please call our office should you change your mind, and we can give you the proper paperwork for you to fill out. Advance Care Planning is important because it:  [x]  Makes sure you receive the medical care that is consistent with your values, goals, and preferences  [x]  It provides guidance to your family and loved ones and reduces their decisional burden about whether or not they are making the right decisions based on your wishes.  Follow the link provided in your after visit summary or read over the paperwork we have mailed to you to help you started getting your Advance Directives in place. If you need assistance in completing these, please reach out to us  so that we can help you!

## 2024-02-22 NOTE — Progress Notes (Signed)
 Subjective:  Please attest and cosign this visit due to patients primary care provider not being in the office at the time the visit was completed.  (Pt of Dr Glade Hope)   Paige [name] is a 73 y.o. who presents for a Medicare Wellness preventive visit.  As a reminder, Annual Wellness Visits don't include a physical exam, and some assessments may be limited, especially if this visit is performed virtually. We may recommend an in-person follow-up visit with your provider if needed.  Visit Complete: Virtual I connected with  Paige [name] on 02/22/24 by a audio enabled telemedicine application and verified that I am speaking with the correct person using two identifiers.  Patient Location: Home  Provider Location: Office/Clinic  I discussed the limitations of evaluation and management by telemedicine. The patient expressed understanding and agreed to proceed.  Vital Signs: Because this visit was a virtual/telehealth visit, some criteria may be missing or patient reported. Any vitals not documented were not able to be obtained and vitals that have been documented are patient reported.  VideoDeclined- This patient declined Librarian, academic. Therefore the visit was completed with audio only.  Persons Participating in Visit: Patient assisted by Tihanna, [place].  AWV Questionnaire: No: Patient Medicare AWV questionnaire was not completed prior to this visit.  Cardiac Risk Factors include: advanced age (>55men, >55 women);diabetes mellitus;dyslipidemia;hypertension;obesity (BMI >30kg/m2)     Objective:    Today's Vitals   02/22/24 1529  Weight: 195 lb (88.5 kg)  Height: 5' 2 (1.575 m)   Body mass index is 35.67 kg/m.     02/22/2024    3:28 PM 10/23/2023    2:23 PM 04/17/2023   12:43 PM 02/17/2023    4:09 PM 02/06/2023    3:29 PM 12/04/2022    3:41 PM 11/26/2022    9:31 PM  Advanced Directives  Does Patient Have a Medical Advance  Directive? No Yes No Yes No No No  Type of Special educational needs teacher of [place];Living will  Healthcare Power of [place];Living will     Copy of Healthcare Power of Attorney in Chart?    No - copy requested     Would patient like information on creating a medical advance directive? No - Patient declined          Current Medications (verified) Outpatient Encounter Medications as of 02/22/2024  Medication Sig   bisoprolol  (ZEBETA ) 5 MG tablet Take 1 tablet (5 mg total) by mouth daily.   escitalopram  (LEXAPRO ) 10 MG tablet Take 1 tablet (10 mg total) by mouth daily. 1 qam   escitalopram  (LEXAPRO ) 20 MG tablet Take 20 mg by mouth daily.   Eszopiclone  3 MG TABS Take 3 mg by mouth at bedtime.   levothyroxine  (SYNTHROID ) 112 MCG tablet Take 1 tablet by mouth once daily   lurasidone  (LATUDA ) 40 MG TABS tablet 1 qhs   memantine  (NAMENDA ) 10 MG tablet Take 1 tablet by mouth twice daily   pravastatin  (PRAVACHOL ) 20 MG tablet Take 1 tablet (20 mg total) by mouth daily.   Vibegron  (GEMTESA ) 75 MG TABS Take 1 tablet (75 mg total) by mouth daily.   No facility-administered encounter medications on file as of 02/22/2024.    Allergies (verified) Asa [aspirin], Amoxicillin -pot clavulanate, Ibuprofen, Morphine, Morphine and codeine, and Rosuvastatin    History: Past Medical History:  Diagnosis Date   AK (actinic keratosis) 10/19/2012   Left shoulder   Attention deficit hyperactivity disorder (ADHD) 06/08/2007   Qualifier:  Diagnosis of   By: Trudy, LPN, Kendell HERO   Auditory hallucinations 09/2022   Back pain with radiculopathy 05/28/2009   Basal cell carcinoma (BCC) of back 11/16/2009   Carpal tunnel syndrome    Cellulitis and abscess of trunk 10/11/2021   Chronic bilateral low back pain without sciatica 07/30/2017   Closed fracture of nasal bones 02/13/2019   Colon polyp    Constipation    Dementia, unclear and potentially mixed etiology 01/27/2023   Deviated septum 02/21/2019    Essential hypertension 06/08/2007   Gallstones    Generalized anxiety disorder 07/30/2009   GERD (gastroesophageal reflux disease)    Hyperlipidemia    Hypothyroidism 06/08/2007   S/p RAI treatment   Left hip pain 03/27/2016   Left wrist pain 12/10/2020   Lumbar radiculopathy 03/19/2016   Major depressive disorder 12/04/2022   Meningioma 07/31/2022   14 mm right frontal lobe -seen on MRI 07/2022   Neck pain 01/16/2016   Osteoarthritis 12/20/2007   Overactive bladder 10/05/2020   Pain in joint of left knee 08/09/2020   Pain of right thumb 09/22/2019   Piriformis syndrome of left side 04/29/2016   Prediabetes 07/05/2018   Pseudogout of hand, left 04/29/2022   Pseudogout of left wrist 01/21/2021   Right shoulder pain 02/14/2019   Status post dilation of esophageal narrowing    Thrombosed hemorrhoids 10/17/2008   Visual hallucinations 09/2022   fully-formed   Past Surgical History:  Procedure Laterality Date   ABDOMINAL HYSTERECTOMY     CARPAL TUNNEL RELEASE Bilateral    CHOLECYSTECTOMY     CLOSED REDUCTION HAND FRACTURE     right hand   COLONOSCOPY  2018   JP-MAC-suprep(exc)-int hems-5 yr recall-   COLONOSCOPY  2013   TA   fx leg     NASAL SINUS SURGERY     POLYPECTOMY  2013   TA   SHOULDER SURGERY     reattac tendon; right shoulder   TIBIA FRACTURE SURGERY     steel rod right leg   TUBAL LIGATION     Family History  Problem Relation Age of Onset   Colon polyps Mother 52   Hyperlipidemia Mother    Colon cancer Mother 38   Lung cancer Father    Mental illness Father    Cancer Paternal Grandfather        mets   Leukemia Maternal Aunt    Esophageal cancer Neg Hx    Rectal cancer Neg Hx    Stomach cancer Neg Hx    Breast cancer Neg Hx    Social History   Socioeconomic History   Marital status: Widowed    Spouse name: Not on file   Number of children: 2   Years of education: 12   Highest education level: High school graduate  Occupational History    Occupation: Retired    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   Are you right handed or left handed?    Are you  currently employed ? Yes    What is your current occupation? at [employer]      Do you live at home alone? YEs   Who lives with you?    What type of home do you live in: 1 story or 2 story? 1       Social Drivers of Corporate investment banker Strain: Low Risk  (02/22/2024)   Overall Financial Resource Strain (CARDIA)    Difficulty of Paying Living Expenses: Not hard at all  Food Insecurity: No Food Insecurity (02/22/2024)   Hunger Vital Sign    Worried About Running Out of Food in the Last Year: Never true    Ran Out of Food in the Last Year: Never true  Transportation Needs: No Transportation Needs (02/22/2024)   PRAPARE - Administrator, Civil Service (Medical): No    Lack of Transportation (Non-Medical): No  Physical Activity: Inactive (02/22/2024)   Exercise Vital Sign    Days of Exercise per Week: 0 days    Minutes of Exercise per Session: 0 min  Stress: No Stress Concern Present (02/22/2024)   [employer] of Occupational Health - Occupational Stress Questionnaire    Feeling of Stress: Not at all  Social Connections: Socially Isolated (02/22/2024)   Social Connection and Isolation Panel    Frequency of Communication with Friends and Family: Twice a week    Frequency of Social Gatherings with Friends and Family: Twice a week    Attends Religious Services: Never    Database administrator or Organizations: No    Attends Tax inspector Meetings: Never    Marital Status: Widowed    Tobacco Counseling Counseling given: No    Clinical Intake:  Pre-visit preparation completed: Yes  Pain : No/denies pain     BMI - recorded: 3567 Nutritional Status: BMI > 30  Obese Nutritional Risks: None Diabetes: Yes CBG done?: No Did pt. bring in CBG monitor from home?: No  Lab Results  Component Value Date   HGBA1C 6.1 08/04/2023   HGBA1C 6.6 (H) 10/27/2022   HGBA1C 6.4 07/28/2022     How often do you need to have someone help you when you read instructions, pamphlets, or other written materials from your doctor or pharmacy?: 5 - Always (Son assists)  Interpreter Needed?: No  Comments: Son assists Information entered by :: Verdie Saba, CMA   Activities of Daily Living     02/22/2024    3:33 PM  In your present state of health, do you have any difficulty performing the following activities:  Hearing? 0  Vision? 0  Difficulty concentrating or making decisions? 0  Walking or climbing stairs? 0  Dressing or bathing? 0  Doing errands, shopping? 0  Preparing Food and eating ? Y  Comment Son assists  Using the Toilet? Y  Comment Son assists  In the past six months, have you accidently leaked urine? Y  Comment wears depends  Do you have problems with loss of bowel control? Y  Managing your Medications? Y  Comment Son assists  Managing your Finances? Y  Housekeeping or managing your Housekeeping? Y    Patient Care Team: Geofm Glade PARAS, MD as PCP - General (Internal Medicine) Tasia Lung, MD as Consulting Physician (Psychiatry) Camella Fallow, MD as Consulting Physician (Orthopedic Surgery) Camillo Golas, MD as Consulting Physician (Ophthalmology) Skeet Juliene SAUNDERS, DO as Consulting Physician (Neurology) Zuleta, Kaitlin G, NP as Nurse Practitioner (Obstetrics and Gynecology)  I  have updated your Care Teams any recent Medical Services you may have received from other providers in the past  year.     Assessment:   This is a routine wellness examination for Renae.  Hearing/Vision screen Hearing Screening - Comments:: Denies hearing difficulties   Vision Screening - Comments:: Referral to [facility]   Goals Addressed               This Visit's Progress     Patient Stated (pt-stated)        Patient stated she plans to continue being active as much as possible       Depression Screen     02/22/2024    3:36 PM 02/16/2024    2:57 PM 08/04/2023    9:37 AM 02/17/2023    4:13 PM 07/28/2022    9:37 AM 04/29/2022    1:16 PM 02/10/2022   10:01 AM  PHQ 2/9 Scores  PHQ - 2 Score 0 0 2 2 6  0 0  PHQ- 9 Score 0 8 8 15 21  0     Fall Risk     02/22/2024    3:35 PM 02/16/2024    2:57 PM 10/23/2023    2:22 PM 08/04/2023    9:37 AM 04/17/2023   12:43 PM  Fall Risk   Falls in the past year? 1 1 1  0 0  Number falls in past yr: 1 1 1  0 0  Comment 2      Injury with Fall? 0 0 0 0 0  Risk for fall due to :  No Fall Risks  No Fall Risks   Follow up Falls evaluation completed;Falls prevention discussed Falls evaluation completed Falls evaluation completed Falls evaluation completed Falls evaluation completed    MEDICARE RISK AT HOME:  Medicare Risk at Home Any stairs in or around the home?: No If so, are there any without handrails?: No Home free of loose throw rugs in walkways, pet beds, electrical cords, etc?: Yes Adequate lighting in your home to reduce risk of falls?: Yes Life alert?: No Use of a cane, walker or w/c?: No Grab bars in the bathroom?: No Shower chair or bench in shower?: No Elevated toilet seat or a handicapped toilet?: No  TIMED UP AND GO:  Was the test performed?  No  Cognitive Function: Impaired: Patient has current diagnosis of cognitive impairment.    02/22/2024    3:37 PM 02/11/2018   11:06 AM  MMSE - Mini Mental State Exam  Not completed: Unable to complete   Orientation to time  5  Orientation to Place  5  Registration  3  Attention/  Calculation  5  Recall  1  Language- name 2 objects  2  Language- repeat  1  Language- follow 3 step command  3  Language- read & follow direction  1  Write a sentence  1  Copy design  1  Total score  28        02/17/2023    4:20 PM  6CIT Screen  What Year? 4 points  What month? 0 points  What time? 3 points  Count back from 20 4 points  Months in reverse 4 points  Repeat phrase 10 points  Total Score 25 points    Immunizations Immunization History  Administered Date(s) Administered   Fluad Quad(high Dose 65+) 05/16/2019, 06/16/2020, 06/08/2021   Fluad Trivalent(High Dose 65+) 08/04/2023   Influenza, High Dose Seasonal PF 06/27/2016, 07/03/2017, 07/05/2018, 06/03/2022   PFIZER(Purple  Top)SARS-COV-2 Vaccination 09/22/2019, 10/13/2019, 06/02/2020   Pfizer Covid-19 Vaccine Bivalent Booster 23yrs & up 05/16/2021   Pneumococcal Conjugate-13 07/18/2016   Pneumococcal Polysaccharide-23 05/25/2015   Tdap 03/03/2012, 02/10/2019    Screening Tests Health Maintenance  Topic Date Due   OPHTHALMOLOGY EXAM  Never done   Diabetic kidney evaluation - Urine ACR  Never done   Zoster Vaccines- Shingrix (1 of 2) Never done   MAMMOGRAM  03/18/2023   COVID-19 Vaccine (5 - 2024-25 season) 04/19/2023   HEMOGLOBIN A1C  02/02/2024   INFLUENZA VACCINE  03/18/2024   Diabetic kidney evaluation - eGFR measurement  08/03/2024   FOOT EXAM  08/03/2024   Medicare Annual Wellness (AWV)  02/21/2025   DEXA SCAN  09/04/2025   Colonoscopy  01/11/2027   DTaP/Tdap/Td (3 - Td or Tdap) 02/09/2029   Pneumococcal Vaccine: 50+ Years  Completed   Hepatitis C Screening  Completed   Hepatitis B Vaccines  Aged Out   HPV VACCINES  Aged Out   Meningococcal B Vaccine  Aged Out    Health Maintenance  Health Maintenance Due  Topic Date Due   OPHTHALMOLOGY EXAM  Never done   Diabetic kidney evaluation - Urine ACR  Never done   Zoster Vaccines- Shingrix (1 of 2) Never done   MAMMOGRAM  03/18/2023   COVID-19  Vaccine (5 - 2024-25 season) 04/19/2023   HEMOGLOBIN A1C  02/02/2024   Health Maintenance Items Addressed:  Mammogram ordered, Referral sent to Optometry/Ophthalmology  Additional Screening:  Vision Screening: Recommended annual ophthalmology exams for early detection of glaucoma and other disorders of the eye. Would you like a referral to an eye doctor? Yes  - to [facility]  Dental Screening: Recommended annual dental exams for proper oral hygiene  Community Resource Referral / Chronic Care Management: CRR required this visit?  No   CCM required this visit?  No   Plan:    I have personally reviewed and noted the following in the patient's chart:   Medical and social history Use of alcohol, tobacco or illicit drugs  Current medications and supplements including opioid prescriptions. Patient is not currently taking opioid prescriptions. Functional ability and status Nutritional status Physical activity Advanced directives List of other physicians Hospitalizations, surgeries, and ER visits in previous 12 months Vitals Screenings to include cognitive, depression, and falls Referrals and appointments  In addition, I have reviewed and discussed with patient certain preventive protocols, quality metrics, and best practice recommendations. A written personalized care plan for preventive services as well as general preventive health recommendations were provided to patient.   Verdie CHRISTELLA Saba, CMA   02/22/2024   After Visit Summary: (MyChart) Due to this being a telephonic visit, the after visit summary with patients personalized plan was offered to patient via MyChart   Notes: Nothing significant to report at this time.

## 2024-02-24 ENCOUNTER — Other Ambulatory Visit

## 2024-02-24 DIAGNOSIS — I10 Essential (primary) hypertension: Secondary | ICD-10-CM | POA: Diagnosis not present

## 2024-02-24 DIAGNOSIS — E1169 Type 2 diabetes mellitus with other specified complication: Secondary | ICD-10-CM | POA: Diagnosis not present

## 2024-02-24 DIAGNOSIS — E039 Hypothyroidism, unspecified: Secondary | ICD-10-CM | POA: Diagnosis not present

## 2024-02-24 DIAGNOSIS — E782 Mixed hyperlipidemia: Secondary | ICD-10-CM | POA: Diagnosis not present

## 2024-02-24 LAB — COMPREHENSIVE METABOLIC PANEL WITH GFR
ALT: 6 U/L (ref 0–35)
AST: 17 U/L (ref 0–37)
Albumin: 4.6 g/dL (ref 3.5–5.2)
Alkaline Phosphatase: 87 U/L (ref 39–117)
BUN: 24 mg/dL — ABNORMAL HIGH (ref 6–23)
CO2: 29 meq/L (ref 19–32)
Calcium: 9.9 mg/dL (ref 8.4–10.5)
Chloride: 104 meq/L (ref 96–112)
Creatinine, Ser: 0.85 mg/dL (ref 0.40–1.20)
GFR: 67.69 mL/min (ref >60.00)
Glucose, Bld: 114 mg/dL — ABNORMAL HIGH (ref 70–99)
Potassium: 4 meq/L (ref 3.5–5.1)
Sodium: 141 meq/L (ref 135–145)
Total Bilirubin: 0.5 mg/dL (ref 0.2–1.2)
Total Protein: 8.1 g/dL (ref 6.0–8.3)

## 2024-02-24 LAB — CBC WITH DIFFERENTIAL/PLATELET
Basophils Absolute: 0 K/uL (ref 0.0–0.1)
Basophils Relative: 0.6 % (ref 0.0–3.0)
Eosinophils Absolute: 0.1 K/uL (ref 0.0–0.7)
Eosinophils Relative: 1.7 % (ref 0.0–5.0)
HCT: 38.2 % (ref 36.0–46.0)
Hemoglobin: 12.7 g/dL (ref 12.0–15.0)
Lymphocytes Relative: 21 % (ref 12.0–46.0)
Lymphs Abs: 1.5 K/uL (ref 0.7–4.0)
MCHC: 33.3 g/dL (ref 30.0–36.0)
MCV: 82.1 fl (ref 78.0–100.0)
Monocytes Absolute: 0.4 K/uL (ref 0.1–1.0)
Monocytes Relative: 5.3 % (ref 3.0–12.0)
Neutro Abs: 5 K/uL (ref 1.4–7.7)
Neutrophils Relative %: 71.4 % (ref 43.0–77.0)
Platelets: 215 K/uL (ref 150.0–400.0)
RBC: 4.66 Mil/uL (ref 3.87–5.11)
RDW: 14.2 % (ref 11.5–15.5)
WBC: 7 K/uL (ref 4.0–10.5)

## 2024-02-24 LAB — LIPID PANEL
Cholesterol: 210 mg/dL — ABNORMAL HIGH (ref 0–200)
HDL: 76.2 mg/dL (ref >39.00)
LDL Cholesterol: 122 mg/dL — ABNORMAL HIGH (ref 0–99)
NonHDL: 133.46
Total CHOL/HDL Ratio: 3
Triglycerides: 55 mg/dL (ref 0.0–149.0)
VLDL: 11 mg/dL (ref 0.0–40.0)

## 2024-02-24 LAB — HEMOGLOBIN A1C: Hgb A1c MFr Bld: 6.1 % (ref 4.6–6.5)

## 2024-02-24 LAB — TSH: TSH: 1.7 u[IU]/mL (ref 0.35–5.50)

## 2024-02-25 ENCOUNTER — Ambulatory Visit: Payer: Self-pay | Admitting: Internal Medicine

## 2024-03-15 ENCOUNTER — Ambulatory Visit (HOSPITAL_BASED_OUTPATIENT_CLINIC_OR_DEPARTMENT_OTHER): Admitting: Psychiatry

## 2024-03-15 ENCOUNTER — Other Ambulatory Visit: Payer: Self-pay

## 2024-03-15 ENCOUNTER — Encounter (HOSPITAL_COMMUNITY): Payer: Self-pay | Admitting: Psychiatry

## 2024-03-15 VITALS — BP 148/78 | HR 79 | Ht 62.0 in | Wt 200.0 lb

## 2024-03-15 DIAGNOSIS — F03918 Unspecified dementia, unspecified severity, with other behavioral disturbance: Secondary | ICD-10-CM

## 2024-03-15 MED ORDER — ESCITALOPRAM OXALATE 10 MG PO TABS: 10.0000 mg | ORAL_TABLET | Freq: Every day | ORAL | 7 refills | Status: DC

## 2024-03-15 NOTE — Progress Notes (Signed)
 Patient ID: Paige [name], female   DOB: July 15, 1951, 73 y.o.   MRN: [id number] Patient ID: Paige [name], female   DOB: [birth year], 73 y.o.   MRN: [id number] [facility] MD Progress Note  03/15/2024 4:34 PM Paige Paige Youtsey   Pa time.st Medical History:   MRN:  [id number] Diagnosis major depressi      Today the patient is seen with her son Paige [name].  The patient continues to slowly decline.  She has difficulty using language.  She needs assistance dressing.  She still can take care of her bathroom needs and she is still eating on her own.  The patient is less complaints of hallucinations.  She demonstrates no overt psychosis.  She seems to be somewhat more dysphoric.  She seems to be more sad at times.  Both of her sons share and taking care of her.  They attempted to get a home health care agency involved but apparently was too expensive.  I have offered them for me to be involved in helping picking such an agency.  Their wishes to keep her as long as possible.  The patient is not aggressive or agitated.  She makes no attempts to elope.  She seems to be easy to manage. Past Medical History:  Diagnosis Date   AK (actinic keratosis) 10/19/2012   Left shoulder   Attention deficit hyperactivity disorder (ADHD) 06/08/2007   Qualifier: Diagnosis of   By: Trudy, LPN, Kendell HERO   Auditory hallucinations 09/2022   Back pain with radiculopathy 05/28/2009   Basal cell carcinoma (BCC) of back 11/16/2009   Carpal tunnel syndrome    Cellulitis and abscess of trunk 10/11/2021   Chronic bilateral low back pain without sciatica 07/30/2017   Closed fracture of nasal bones 02/13/2019   Colon polyp    Constipation    Dementia, unclear and potentially mixed etiology 01/27/2023   Deviated septum 02/21/2019   Essential hypertension 06/08/2007   Gallstones    Generalized anxiety disorder 07/30/2009   GERD (gastroesophageal reflux disease)    Hyperlipidemia    Hypothyroidism 06/08/2007   S/p RAI treatment    Left hip pain 03/27/2016   Left wrist pain 12/10/2020   Lumbar radiculopathy 03/19/2016   Major depressive disorder 12/04/2022   Meningioma 07/31/2022   14 mm right frontal lobe -seen on MRI 07/2022   Neck pain 01/16/2016   Osteoarthritis 12/20/2007   Overactive bladder 10/05/2020   Pain in joint of left knee 08/09/2020   Pain of right thumb 09/22/2019   Piriformis syndrome of left side 04/29/2016   Prediabetes 07/05/2018   Pseudogout of hand, left 04/29/2022   Pseudogout of left wrist 01/21/2021   Right shoulder pain 02/14/2019   Status post dilation of esophageal narrowing    Thrombosed hemorrhoids 10/17/2008   Visual hallucinations 09/2022   fully-formed    Past Surgical History:  Procedure Laterality Date   ABDOMINAL HYSTERECTOMY     CARPAL TUNNEL RELEASE Bilateral    CHOLECYSTECTOMY     CLOSED REDUCTION HAND FRACTURE     right hand   COLONOSCOPY  2018   JP-MAC-suprep(exc)-int hems-5 yr recall-   COLONOSCOPY  2013   TA   fx leg     NASAL SINUS SURGERY     POLYPECTOMY  2013   TA   SHOULDER SURGERY     reattac tendon; right shoulder   TIBIA FRACTURE SURGERY     steel rod right leg   TUBAL LIGATION     Family History:  Family History  Problem Relation Age of Onset   Colon polyps Mother 40   Hyperlipidemia Mother    Colon cancer Mother 72   Lung cancer Father    Mental illness Father    Cancer Paternal Grandfather        mets   Leukemia Maternal Aunt    Esophageal cancer Neg Hx    Rectal cancer Neg Hx    Stomach cancer Neg Hx    Breast cancer Neg Hx    Family Psychiatric  History:  Social History:  Social History   Substance and Sexual Activity  Alcohol Use No     Social History   Substance and Sexual Activity  Drug Use No    Social History   Socioeconomic History   Marital status: Widowed    Spouse name: Not on file   Number of children: 2   Years of education: 12   Highest education level: High school graduate  Occupational History    Occupation: Retired    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   Are you right handed or left handed?    Are you currently employed ? Yes    What is your current occupation? at [employer]      Do you live at home alone? YEs   Who lives with you?    What type of home do you live in: 1 story or 2 story? 1       Social Drivers of Corporate investment banker Strain: Low Risk  (02/22/2024)   Overall Financial Resource Strain (CARDIA)    Difficulty of Paying Living Expenses: Not hard at all  Food Insecurity: No Food Insecurity (02/22/2024)   Hunger Vital Sign    Worried About Running Out of Food in the Last Year: Never true    Ran Out of Food in the Last Year: Never true  Transportation Needs: No Transportation Needs (02/22/2024)   PRAPARE - Administrator, Civil Service (Medical): No    Lack of Transportation (Non-Medical): No  Physical Activity: Inactive (02/22/2024)   Exercise Vital Sign    Days of Exercise per Week: 0 days    Minutes of Exercise per Session: 0 min  Stress: No Stress Concern Present (02/22/2024)   [employer] of Occupational Health - Occupational Stress Questionnaire    Feeling of Stress: Not at all  Social Connections: Socially Isolated (02/22/2024)   Social Connection and Isolation Panel    Frequency of Communication with Friends and Family: Twice a week    Frequency of Social Gatherings with Friends and Family: Twice a week    Attends Religious Services: Never    Database administrator or Organizations: No    Attends Tax inspector Meetings: Never    Marital Status: Widowed   Additional Social History:  Sleep: Fair  Appetite:  Good  Current Medications: Current Outpatient Medications  Medication Sig Dispense Refill   bisoprolol  (ZEBETA ) 5 MG tablet Take 1 tablet (5 mg total) by mouth daily. 90 tablet 3   escitalopram  (LEXAPRO ) 10 MG tablet Take 1 tablet (10 mg total) by mouth daily. 1 qam 30 tablet 7   Eszopiclone  3 MG TABS Take 3 mg by mouth at bedtime.     levothyroxine  (SYNTHROID ) 112 MCG tablet Take 1 tablet by mouth once daily 90 tablet 0   memantine  (NAMENDA ) 10 MG tablet Take 1 tablet by mouth twice daily 60 tablet 5   pravastatin  (PRAVACHOL ) 20 MG tablet Take 1 tablet (20 mg total) by mouth daily. 90 tablet 0   Vibegron  (GEMTESA ) 75 MG TABS Take 1 tablet (75 mg total) by mouth daily. 90 tablet 3   No current facility-administered medications for this visit.    Lab Results: No results found for this or any previous visit (from the past 48 hours).  Blood Alcohol level:  No results found for: [facility]  Physical Findings: AIMS:  , ,  ,  ,    CIWA:    COWS:     Musculoskeletal: Strength & Muscle Tone: within normal limits Gait & Station: normal Patient leans: N/A  Psychiatric Specialty Exam: ROS  Blood pressure (!) 148/78, pulse 79, height 5' 2 (1.575 m), weight 200 lb (90.7 kg).Body mass index is 36.58 kg/m.  General Appearance: Fairly Groomed  Patent attorney::  Good  Speech:  Clear and Coherent  Volume:  Normal  Mood:  Euthymic  Affect:  Blunt  Thought Process:  Coherent  Orientation:  Full (Time, Place, and Person)  Thought Content:  WDL  Suicidal Thoughts:  No  Homicidal Thoughts:  No  Memory:  NA  Judgement:  NA  Insight:  Good  Psychomotor Activity:  Decreased  Concentration:  Fair  Recall:  [employer] of Knowledge:Good  Language: Fair  Akathisia:  No  Handed:  Right  AIMS (if indicated):     Assets:  Desire for Improvement   ADL's:  Intact  Cognition: WNL  Sleep:      Treatment Plan Summary: 03/15/2024, 4:34 PM   This patient's diagnosis is that of dementia with psychosis.  At this time we will discontinue her Latuda .  She will continue taking Namenda  and continue Lexapro  at 10 mg.  The patient is reasonably stable.  It is very clear that she is declining.  Her son seems to be aware of this.  I believe the patient is getting good care by her family.  She will return to see me in 6 months.

## 2024-04-11 ENCOUNTER — Other Ambulatory Visit: Payer: Self-pay | Admitting: Internal Medicine

## 2024-04-27 ENCOUNTER — Other Ambulatory Visit (HOSPITAL_COMMUNITY): Payer: Self-pay | Admitting: *Deleted

## 2024-04-27 MED ORDER — LURASIDONE HCL 20 MG PO TABS: 20.0000 mg | ORAL_TABLET | Freq: Every day | ORAL | 2 refills | Status: DC

## 2024-05-01 ENCOUNTER — Other Ambulatory Visit: Payer: Self-pay | Admitting: Neurology

## 2024-05-06 NOTE — Progress Notes (Signed)
 NEUROLOGY FOLLOW UP OFFICE NOTE  Paige [name] [id number]  Assessment/Plan:   Major neurocognitive disorder.  Unclear etiology.  CSF results suggestive of Alzheimer's disease.  However her symptoms and neuropsychological evaluation performance, while not ruled out, not typical for Alzheimer's disease.  Given the prominent hallucinations at onset, Lewy Body dementia is considered.  Autoimmune/paraneolplastic panel was negative. Right frontal lobe meningioma     Memantine  10mg  twice daily Psychiatric care managed by Dr. Tasia To evaluate for possible Lewy Body dementia, check DaT scan.  If unremarkable, I would resubmit referral to the memory clinic at [facility] for clarity of her diagnosis. Follow up 6 months.  Subjective:  Paige [name] is a 73 year old right-handed female with ADD, HTN, HLD, anxiety and hypothyroidism who follows up for aphasia and new psychosis.  Psychiatry notes reviewed.  Patient poor historian.  History obtained from Tiffany.   UPDATE: Current medications:  memantine  10mg  twice daily, Latuda  60mg  daily, excitalopram 20mg  daily, levothyroxine , bisoprolol , vigegron, eszopiclone   DaTscan  on 12/04/2023 was within normal limits.  She was again referred to the Memory Clinic at [facility] but they never received a call.  She continues to decline.  She is dependent to perform all ADLs, including managing her medications, bathing, dressing, and using the commode.  She wears diapers.  She hasn't had hallucinations.  Dr. Tasia had taken her off Latuda  and she had once talked about a woman who wasn't there.  He restarted 20mg  daily and hasn't had any hallucinations.  She is with somebody from 5 PM until morning.  However, she is alone during the day because her family works.  They are unable to get a caregiver during the day due to financial limitations.  She has extreme anxiety and has been tremulous.  She has a paucity of speech.  Short term memory has declined.       HISTORY: She started having trouble with getting words out in 2023.  Sometimes she cannot understand what other people are telling her.  When she writes, she is unsure if it is correct.  Sometimes she has trouble hearing, such as people knocking on the door.  In February 2024, she began experiencing sudden onset visual and auditory hallucinations.  She started seeing unfamiliar people in the home talking to her, sometimes making demands.  One time, she insisted that somebody was up in her mother's attic.  Another time, her mother saw her talking to the table because she saw somebody sitting there.  She once was holding a blouse because she had to give it to a lady at the back door.  She has history of depression and is followed by her psychiatrist, Dr. Tasia, but she never had previous history of psychosis.  She has had a couple of falls but nothing significant.  Initially, she was found to have a UTI, but symptoms have persisted.  She underwent neuropsychological evaluation on 01/27/2023 which revealed diffuse cognitive dysfunction. Specific diagnosis was unclear.  Logopenic primary progressive aphasia possible but testing patterns not consistent with Alzheimer's disease and it would not explain psychosis.  Lewy body disease considered.  Both her son and mother at that time have denied noticing any signs of memory impairment or speech impairment.  She has never exhibited any psychosis in the past.  Her mother and her son have never noted any cognitive deficits prior to 2024.     TSH from 07/28/2022 was 1.85.  MRI of brain without contrast on 07/30/2022 personally reviewed revealed  atrophy and mildly advanced chronic small vessel ischemic changes with a 14 mm calcified right frontal lobe meningioma.    Routine EEG on 02/18/2023 was normal.  Underwent LP on 03/18/2023 for CSF analysis, which revealed elevated p-Tau/Abeta42 of 0.043 and phosho-Tau of 25.8 on the [facility] Alzheimer Disease Evaluation with low Abeta42  595 and normal total-Tau 237; [facility] Autoimmune/Paraneoplastic Dementia panel negative; negative ANA; cell count 1; protein 56; glucose 75; VDRL nonreactive; no oligoclonal bands; IgG index 0.56; HSV 1/2 PCR negative; negative cytology; GS and Cx negative; fungal Cx negative; elevated 14-3-3 of 5053 but negative RT-Quic and normal T-tau progein of 401.     Family history:  brother has  Bipolar disorder  PAST MEDICAL HISTORY: Past Medical History:  Diagnosis Date   AK (actinic keratosis) 10/19/2012   Left shoulder   Attention deficit hyperactivity disorder (ADHD) 06/08/2007   Qualifier: Diagnosis of   By: Trudy, LPN, Kendell HERO   Auditory hallucinations 09/2022   Back pain with radiculopathy 05/28/2009   Basal cell carcinoma (BCC) of back 11/16/2009   Carpal tunnel syndrome    Cellulitis and abscess of trunk 10/11/2021   Chronic bilateral low back pain without sciatica 07/30/2017   Closed fracture of nasal bones 02/13/2019   Colon polyp    Constipation    Dementia, unclear and potentially mixed etiology 01/27/2023   Deviated septum 02/21/2019   Essential hypertension 06/08/2007   Gallstones    Generalized anxiety disorder 07/30/2009   GERD (gastroesophageal reflux disease)    Hyperlipidemia    Hypothyroidism 06/08/2007   S/p RAI treatment   Left hip pain 03/27/2016   Left wrist pain 12/10/2020   Lumbar radiculopathy 03/19/2016   Major depressive disorder 12/04/2022   Meningioma 07/31/2022   14 mm right frontal lobe -seen on MRI 07/2022   Neck pain 01/16/2016   Osteoarthritis 12/20/2007   Overactive bladder 10/05/2020   Pain in joint of left knee 08/09/2020   Pain of right thumb 09/22/2019   Piriformis syndrome of left side 04/29/2016   Prediabetes 07/05/2018   Pseudogout of hand, left 04/29/2022   Pseudogout of left wrist 01/21/2021   Right shoulder pain 02/14/2019   Status post dilation of esophageal narrowing    Thrombosed hemorrhoids 10/17/2008   Visual hallucinations  09/2022   fully-formed    MEDICATIONS: Current Outpatient Medications on File Prior to Visit  Medication Sig Dispense Refill   bisoprolol  (ZEBETA ) 5 MG tablet Take 1 tablet (5 mg total) by mouth daily. 90 tablet 3   escitalopram  (LEXAPRO ) 10 MG tablet Take 1 tablet (10 mg total) by mouth daily. 1 qam 30 tablet 7   Eszopiclone  3 MG TABS Take 3 mg by mouth at bedtime.     levothyroxine  (SYNTHROID ) 112 MCG tablet Take 1 tablet by mouth once daily 90 tablet 0   lurasidone  (LATUDA ) 20 MG TABS tablet Take 1 tablet (20 mg total) by mouth daily at 2 PM. 30 tablet 2   memantine  (NAMENDA ) 10 MG tablet Take 1 tablet by mouth twice daily 60 tablet 0   pravastatin  (PRAVACHOL ) 20 MG tablet Take 1 tablet (20 mg total) by mouth daily. 90 tablet 0   Vibegron  (GEMTESA ) 75 MG TABS Take 1 tablet (75 mg total) by mouth daily. 90 tablet 3   No current facility-administered medications on file prior to visit.    ALLERGIES: Allergies  Allergen Reactions   Asa [Aspirin] Other (See Comments)    SOB and blocks ears and nose   Amoxicillin -Pot Clavulanate  Nausea And Vomiting    Other Reaction(s): Other (See Comments)   Ibuprofen Swelling   Morphine Nausea And Vomiting    Other Reaction(s): Other (See Comments)   Morphine And Codeine Nausea And Vomiting   Rosuvastatin      Joint pain  Other Reaction(s): Muscle Pain    FAMILY HISTORY: Family History  Problem Relation Age of Onset   Colon polyps Mother 57   Hyperlipidemia Mother    Colon cancer Mother 36   Lung cancer Father    Mental illness Father    Cancer Paternal Grandfather        mets   Leukemia Maternal Aunt    Esophageal cancer Neg Hx    Rectal cancer Neg Hx    Stomach cancer Neg Hx    Breast cancer Neg Hx       Objective:  Blood pressure (!) 114/54, height 5' 5 (1.651 m), weight 180 lb (81.6 kg). General: No acute distress.  Tremulous, anxious  Head:  Normocephalic/atraumatic Eyes:  Fundi examined but not visualized Neck: supple,  no paraspinal tenderness, full range of motion Heart:  Regular rate and rhythm Neurological Exam: alert and oriented to self.  Unable to tell me time and place.  Couldn't take the SLUMS test.  Speech fluent and not dysarthric, language intact.  CN II-XII intact. Bulk and tone normal, muscle strength 5/5 throughout.  No bradykinesia.  Tremulous.  Sensation to light touch intact.  Deep tendon reflexes 2+ throughout, toes downgoing.  Finger to nose testing intact.  Gait slow but steady.  No shuffling.  Romberg negative.   Juliene Dunnings, DO  CC: Glade Hope, MD

## 2024-05-09 ENCOUNTER — Ambulatory Visit: Admitting: Neurology

## 2024-05-09 ENCOUNTER — Encounter: Payer: Self-pay | Admitting: Neurology

## 2024-05-09 VITALS — BP 114/54 | Ht 65.0 in | Wt 180.0 lb

## 2024-05-09 DIAGNOSIS — F0390 Unspecified dementia without behavioral disturbance: Secondary | ICD-10-CM

## 2024-05-09 NOTE — Patient Instructions (Signed)
 Continue memantine  10mg  twice daily Continue management with Dr. Tasia

## 2024-05-31 ENCOUNTER — Other Ambulatory Visit: Payer: Self-pay | Admitting: Neurology

## 2024-07-07 ENCOUNTER — Other Ambulatory Visit: Payer: Self-pay | Admitting: Internal Medicine

## 2024-07-13 ENCOUNTER — Other Ambulatory Visit: Payer: Self-pay | Admitting: Internal Medicine

## 2024-07-13 ENCOUNTER — Other Ambulatory Visit: Payer: Self-pay

## 2024-07-27 ENCOUNTER — Other Ambulatory Visit: Payer: Self-pay | Admitting: Internal Medicine

## 2024-07-27 ENCOUNTER — Other Ambulatory Visit (HOSPITAL_COMMUNITY): Payer: Self-pay | Admitting: Psychiatry

## 2024-08-02 ENCOUNTER — Other Ambulatory Visit (HOSPITAL_COMMUNITY): Payer: Self-pay | Admitting: *Deleted

## 2024-08-02 MED ORDER — LURASIDONE HCL 20 MG PO TABS: 20.0000 mg | ORAL_TABLET | Freq: Every day | ORAL | 1 refills | Status: DC

## 2024-08-09 ENCOUNTER — Other Ambulatory Visit: Payer: Self-pay | Admitting: Obstetrics and Gynecology

## 2024-08-18 DEATH — deceased

## 2024-08-19 ENCOUNTER — Ambulatory Visit: Admitting: Internal Medicine

## 2024-09-21 ENCOUNTER — Ambulatory Visit (HOSPITAL_COMMUNITY): Admitting: Psychiatry

## 2024-11-29 ENCOUNTER — Ambulatory Visit: Admitting: Neurology

## 2025-02-27 ENCOUNTER — Ambulatory Visit
